# Patient Record
Sex: Male | Born: 1950
Health system: Southern US, Community
[De-identification: ages and names within clinical notes are randomized; demographics above are authoritative.]

## PROBLEM LIST (undated history)

## (undated) DIAGNOSIS — M109 Gout, unspecified: Secondary | ICD-10-CM

## (undated) DIAGNOSIS — R7989 Other specified abnormal findings of blood chemistry: Secondary | ICD-10-CM

## (undated) DIAGNOSIS — F419 Anxiety disorder, unspecified: Secondary | ICD-10-CM

## (undated) DIAGNOSIS — R52 Pain, unspecified: Secondary | ICD-10-CM

## (undated) DIAGNOSIS — F329 Major depressive disorder, single episode, unspecified: Secondary | ICD-10-CM

## (undated) DIAGNOSIS — F32A Depression, unspecified: Secondary | ICD-10-CM

## (undated) DIAGNOSIS — K4030 Unilateral inguinal hernia, with obstruction, without gangrene, not specified as recurrent: Secondary | ICD-10-CM

## (undated) HISTORY — DX: Other specified abnormal findings of blood chemistry: R79.89

## (undated) HISTORY — DX: Depression, unspecified: F32.A

## (undated) HISTORY — DX: Gout, unspecified: M10.9

## (undated) HISTORY — DX: Pain, unspecified: R52

## (undated) HISTORY — DX: Unilateral inguinal hernia, with obstruction, without gangrene, not specified as recurrent: K40.30

## (undated) HISTORY — DX: Anxiety disorder, unspecified: F41.9

---
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
## 1992-07-23 HISTORY — PX: APPENDECTOMY: SHX54

## 2012-08-23 DEATH — deceased

## 2013-02-03 DIAGNOSIS — R109 Unspecified abdominal pain: Secondary | ICD-10-CM | POA: Insufficient documentation

## 2013-02-03 DIAGNOSIS — F419 Anxiety disorder, unspecified: Secondary | ICD-10-CM | POA: Insufficient documentation

## 2013-02-03 HISTORY — DX: Unspecified abdominal pain: R10.9

## 2013-02-26 DIAGNOSIS — J329 Chronic sinusitis, unspecified: Secondary | ICD-10-CM | POA: Insufficient documentation

## 2013-02-26 DIAGNOSIS — J449 Chronic obstructive pulmonary disease, unspecified: Secondary | ICD-10-CM | POA: Insufficient documentation

## 2013-02-26 DIAGNOSIS — R0600 Dyspnea, unspecified: Secondary | ICD-10-CM | POA: Insufficient documentation

## 2013-07-23 HISTORY — PX: NASAL SINUS SURGERY: SHX719

## 2013-08-13 DIAGNOSIS — K469 Unspecified abdominal hernia without obstruction or gangrene: Secondary | ICD-10-CM | POA: Insufficient documentation

## 2013-08-13 DIAGNOSIS — F17200 Nicotine dependence, unspecified, uncomplicated: Secondary | ICD-10-CM | POA: Insufficient documentation

## 2015-04-07 ENCOUNTER — Encounter: Payer: Self-pay | Admitting: Emergency Medicine

## 2015-04-07 ENCOUNTER — Emergency Department
Admission: EM | Admit: 2015-04-07 | Discharge: 2015-04-07 | Disposition: A | Discharge status: Home / Self Care | Payer: Self-pay | Attending: Emergency Medicine | Admitting: Emergency Medicine

## 2015-04-07 DIAGNOSIS — M109 Gout, unspecified: Secondary | ICD-10-CM

## 2015-04-07 DIAGNOSIS — M1009 Idiopathic gout, multiple sites: Secondary | ICD-10-CM | POA: Insufficient documentation

## 2015-04-07 DIAGNOSIS — Z720 Tobacco use: Secondary | ICD-10-CM | POA: Insufficient documentation

## 2015-04-07 LAB — COMPREHENSIVE METABOLIC PANEL
ALBUMIN: 3.8 g/dL (ref 3.5–5.0)
ALT: 12 U/L — ABNORMAL LOW (ref 17–63)
AST: 19 U/L (ref 15–41)
Alkaline Phosphatase: 62 U/L (ref 38–126)
Anion gap: 9 (ref 5–15)
BUN: 12 mg/dL (ref 6–20)
CHLORIDE: 98 mmol/L — AB (ref 101–111)
CO2: 28 mmol/L (ref 22–32)
Calcium: 9.5 mg/dL (ref 8.9–10.3)
Creatinine, Ser: 0.82 mg/dL (ref 0.61–1.24)
GFR calc Af Amer: >60 mL/min (ref >60)
GFR calc non Af Amer: >60 mL/min (ref >60)
GLUCOSE: 114 mg/dL — AB (ref 65–99)
POTASSIUM: 4.3 mmol/L (ref 3.5–5.1)
SODIUM: 135 mmol/L (ref 135–145)
Total Bilirubin: 1.9 mg/dL — ABNORMAL HIGH (ref 0.3–1.2)
Total Protein: 8.2 g/dL — ABNORMAL HIGH (ref 6.5–8.1)

## 2015-04-07 LAB — URINALYSIS COMPLETE WITH MICROSCOPIC (ARMC ONLY)
BACTERIA UA: NONE SEEN
BILIRUBIN URINE: NEGATIVE
Glucose, UA: NEGATIVE mg/dL
Hgb urine dipstick: NEGATIVE
LEUKOCYTES UA: NEGATIVE
Nitrite: NEGATIVE
PH: 6 (ref 5.0–8.0)
Protein, ur: 30 mg/dL — AB
Specific Gravity, Urine: 1.019 (ref 1.005–1.030)

## 2015-04-07 LAB — URIC ACID: URIC ACID, SERUM: 7.7 mg/dL — AB (ref 4.4–7.6)

## 2015-04-07 LAB — CBC
HEMATOCRIT: 42.2 % (ref 40.0–52.0)
HEMOGLOBIN: 14.5 g/dL (ref 13.0–18.0)
MCH: 32.6 pg (ref 26.0–34.0)
MCHC: 34.3 g/dL (ref 32.0–36.0)
MCV: 95 fL (ref 80.0–100.0)
Platelets: 238 10*3/uL (ref 150–440)
RBC: 4.45 MIL/uL (ref 4.40–5.90)
RDW: 13 % (ref 11.5–14.5)
WBC: 11.4 10*3/uL — AB (ref 3.8–10.6)

## 2015-04-07 LAB — LIPASE, BLOOD: LIPASE: 35 U/L (ref 22–51)

## 2015-04-07 MED ORDER — OXYCODONE-ACETAMINOPHEN 5-325 MG PO TABS: 1.0000 | ORAL_TABLET | Freq: Four times a day (QID) | ORAL | Status: DC | PRN

## 2015-04-07 MED ORDER — ONDANSETRON 4 MG PO TBDP
8.0000 mg | ORAL_TABLET | Freq: Once | ORAL | Status: AC
  Administered 2015-04-07: 8 mg via ORAL

## 2015-04-07 MED ORDER — OXYCODONE-ACETAMINOPHEN 5-325 MG PO TABS
ORAL_TABLET | ORAL | Status: AC
  Administered 2015-04-07: 2 via ORAL
  Filled 2015-04-07: qty 2

## 2015-04-07 MED ORDER — COLCHICINE 0.6 MG PO TABS
1.2000 mg | ORAL_TABLET | Freq: Once | ORAL | Status: AC
  Administered 2015-04-07: 1.2 mg via ORAL

## 2015-04-07 MED ORDER — COLCHICINE 0.6 MG PO TABS
ORAL_TABLET | ORAL | Status: AC
  Administered 2015-04-07: 1.2 mg via ORAL
  Filled 2015-04-07: qty 2

## 2015-04-07 MED ORDER — ALLOPURINOL 100 MG PO TABS: 100.0000 mg | ORAL_TABLET | Freq: Every day | ORAL | Status: DC

## 2015-04-07 MED ORDER — OXYCODONE-ACETAMINOPHEN 5-325 MG PO TABS
2.0000 | ORAL_TABLET | Freq: Once | ORAL | Status: AC
  Administered 2015-04-07: 2 via ORAL

## 2015-04-07 NOTE — ED Notes (Signed)
Says not feeling well for months and has left lower quad pain, but really came for swelling in right knee and left foot taht he thinks may be gout

## 2015-04-07 NOTE — ED Provider Notes (Signed)
[facility] Emergency Department Provider Note  ____________________________________________  Time seen: 3:10 PM  I have reviewed the triage vital signs and the nursing notes.   HISTORY  Chief Complaint Joint Swelling    HPI Lance House is a 64 y.o. male who complains of right knee pain for 3 days. Also bilateral foot pain. The knee is very swollen according the patient. He has a history of gout but has not had any flareups for 20 years. He has been taking allopurinol up to about 8 years ago.when he stopped taking the allopurinol, he also stopped drinking alcohol, however he is receiving this over the past 2-3 years. He drinks primarily beer every night. No fevers or chills. No recent trauma or soft tissue injuries. No penile discharge or concern for sexual transmitted infections     History reviewed. No pertinent past medical history. gout  There are no active problems to display for this patient.    No past surgical history on file. negative  Current Outpatient Rx  Name  Route  Sig  Dispense  Refill  . allopurinol (ZYLOPRIM) 100 MG tablet   Oral   Take 1 tablet (100 mg total) by mouth daily. Take 100mg  by mouth daily for 1 week.  Then increase to 200mg  by mouth daily for 1 more week.  Then increase to 300mg  by mouth daily thereafter.   90 tablet   3   . oxyCODONE-acetaminophen (ROXICET) 5-325 MG per tablet   Oral   Take 1 tablet by mouth every 6 (six) hours as needed for severe pain.   12 tablet   0      Allergies Ciprocin-fluocin-procin   No family history on file.  Social History Social History  Substance Use Topics  . Smoking status: Current Every Day Smoker  . Smokeless tobacco: None  . Alcohol Use: Yes    Review of Systems  Constitutional:   No fever or chills. No weight changes Eyes:   No blurry vision or double vision.  ENT:   No sore throat. Cardiovascular:   No chest pain. Respiratory:   No dyspnea or  cough. Gastrointestinal:   Negative for abdominal pain, vomiting and diarrhea.  No BRBPR or melena. Genitourinary:   Negative for dysuria, urinary retention, bloody urine, or difficulty urinating. Musculoskeletal:   Bilateral foot pain as well as right knee pain and swelling. Skin:   Negative for rash. Neurological:   Negative for headaches, focal weakness or numbness. Psychiatric:  No anxiety or depression.   Endocrine:  No hot/cold intolerance, changes in energy, or sleep difficulty.  10-point ROS otherwise negative.  ____________________________________________   PHYSICAL EXAM:  VITAL SIGNS: ED Triage Vitals  Enc Vitals Group     BP 04/07/15 1324 121/93 mmHg     Pulse Rate 04/07/15 1324 103     Resp 04/07/15 1324 18     Temp 04/07/15 1326 98.3 F (36.8 C)     Temp Source 04/07/15 1326 Oral     SpO2 04/07/15 1324 99 %     Weight 04/07/15 1324 180 lb (81.647 kg)     Height 04/07/15 1324 6\' 1"  (1.854 m)     Head Cir --      Peak Flow --      Pain Score 04/07/15 1326 10     Pain Loc --      Pain Edu? --      Excl. in [place]? --      Constitutional:   Alert and  oriented. Well appearing amoderate distress due to pain. Eyes:   No scleral icterus. No conjunctival pallor. PERRL. EOMI ENT   Head:   Normocephalic and atraumatic.   Nose:   No congestion/rhinnorhea. No septal hematoma   Mouth/Throat:   MMM, no pharyngeal erythema. No peritonsillar mass. No uvula shift.   Neck:   No stridor. No SubQ emphysema. No meningismus. Hematological/Lymphatic/Immunilogical:   No cervical lymphadenopathy. Cardiovascular:   RRR. Normal and symmetric distal pulses are present in all extremities. No murmurs, rubs, or gallops. Respiratory:   Normal respiratory effort without tachypnea nor retractions. Breath sounds are clear and equal bilaterally. No wheezes/rales/rhonchi. Gastrointestinal:   Soft and nontender. No distention. There is no CVA tenderness.  No rebound, rigidity, or  guarding. Genitourinary:   deferred Musculoskeletal:   Bilateral hips unremarkable. Left knee is unremarkable. Left foot shows some diffuse swelling without focal erythema tenderness. It is stable without any bony tenderness. Right foot is unremarkable. Right knee is severely enlarged with a large joint effusion palpable on exam. It is also very tender to the touch. There is no erythema, and is not warm to the touch. No induration or cellulitis.. Neurologic:   Normal speech and language.  CN 2-10 normal. Motor grossly intact. No pronator drift.  Normal gait. No gross focal neurologic deficits are appreciated.  Skin:    Skin is warm, dry and intact. No rash noted.  No petechiae, purpura, or bullae. Psychiatric:   Mood and affect are normal. Speech and behavior are normal. Patient exhibits appropriate insight and judgment.  ____________________________________________    LABS (pertinent positives/negatives) (all labs ordered are listed, but only abnormal results are displayed) Labs Reviewed  COMPREHENSIVE METABOLIC PANEL - Abnormal; Notable for the following:    Chloride 98 (*)    Glucose, Bld 114 (*)    Total Protein 8.2 (*)    ALT 12 (*)    Total Bilirubin 1.9 (*)    All other components within normal limits  CBC - Abnormal; Notable for the following:    WBC 11.4 (*)    All other components within normal limits  URINALYSIS COMPLETEWITH MICROSCOPIC ([facility] ONLY) - Abnormal; Notable for the following:    Color, Urine AMBER (*)    APPearance CLEAR (*)    Ketones, ur TRACE (*)    Protein, ur 30 (*)    Squamous Epithelial / LPF 0-5 (*)    All other components within normal limits  URIC ACID - Abnormal; Notable for the following:    Uric Acid, Serum 7.7 (*)    All other components within normal limits  LIPASE, BLOOD   ____________________________________________   EKG    ____________________________________________     RADIOLOGY    ____________________________________________   PROCEDURES   ____________________________________________   INITIAL IMPRESSION / ASSESSMENT AND PLAN / ED COURSE  Pertinent labs & imaging results that were available during my care of the patient were reviewed by me and considered in my medical decision making (see chart for details).  Concern for gout flare versus septic arthritis of the right knee. I'll add an uric acid level to the labs, and if this is not elevated I will plan for a joint aspiration to evaluate for infection.  ----------------------------------------- 5:03 PM on 04/07/2015 -----------------------------------------  Uric acid elevated to 7.7. The patient states this is consistent with his gout flares. This makes sense given the fact that he has 8 recently started drinking more and more severe after having discontinued the allopurinol 8 years ago.  We'll give him colchicine Zofran and Percocet and reassess.  ----------------------------------------- 7:44 PM on 04/07/2015 -----------------------------------------  Patient feeling much better. Able to stand and walk short distances. Has crutches in the car. Comfortable with going home. We'll restart him on allopurinol and have him take Roxicet as well over the next few days. Encouraged him to follow up with primary care for continued monitoring of his symptoms. Also encourage him to stop drinking beer and stay well hydrated to help with this. Low suspicion at this point for septic arthritis. Low suspicion for necrotizing fasciitis ostial myelitis. I did encourage him to follow-up for consideration of any rheumatologic or autoimmune inflammatory disease.   ____________________________________________   FINAL CLINICAL IMPRESSION(S) / ED DIAGNOSES  Final diagnoses:  Acute gout of multiple sites, unspecified cause      Carrie Mew, MD 04/07/15 1945

## 2015-04-07 NOTE — Discharge Instructions (Signed)
Gout Gout is an inflammatory arthritis caused by a buildup of uric acid crystals in the joints. Uric acid is a chemical that is normally present in the blood. When the level of uric acid in the blood is too high it can form crystals that deposit in your joints and tissues. This causes joint redness, soreness, and swelling (inflammation). Repeat attacks are common. Over time, uric acid crystals can form into masses (tophi) near a joint, destroying bone and causing disfigurement. Gout is treatable and often preventable. CAUSES  The disease begins with elevated levels of uric acid in the blood. Uric acid is produced by your body when it breaks down a naturally found substance called purines. Certain foods you eat, such as meats and fish, contain high amounts of purines. Causes of an elevated uric acid level include:  Being passed down from parent to child (heredity).  Diseases that cause increased uric acid production (such as obesity, psoriasis, and certain cancers).  Excessive alcohol use.  Diet, especially diets rich in meat and seafood.  Medicines, including certain cancer-fighting medicines (chemotherapy), water pills (diuretics), and aspirin.  Chronic kidney disease. The kidneys are no longer able to remove uric acid well.  Problems with metabolism. Conditions strongly associated with gout include:  Obesity.  High blood pressure.  High cholesterol.  Diabetes. Not everyone with elevated uric acid levels gets gout. It is not understood why some people get gout and others do not. Surgery, joint injury, and eating too much of certain foods are some of the factors that can lead to gout attacks. SYMPTOMS   An attack of gout comes on quickly. It causes intense pain with redness, swelling, and warmth in a joint.  Fever can occur.  Often, only one joint is involved. Certain joints are more commonly involved:  Base of the big toe.  Knee.  Ankle.  Wrist.  Finger. Without  treatment, an attack usually goes away in a few days to weeks. Between attacks, you usually will not have symptoms, which is different from many other forms of arthritis. DIAGNOSIS  Your caregiver will suspect gout based on your symptoms and exam. In some cases, tests may be recommended. The tests may include:  Blood tests.  Urine tests.  X-rays.  Joint fluid exam. This exam requires a needle to remove fluid from the joint (arthrocentesis). Using a microscope, gout is confirmed when uric acid crystals are seen in the joint fluid. TREATMENT  There are two phases to gout treatment: treating the sudden onset (acute) attack and preventing attacks (prophylaxis).  Treatment of an Acute Attack.  Medicines are used. These include anti-inflammatory medicines or steroid medicines.  An injection of steroid medicine into the affected joint is sometimes necessary.  The painful joint is rested. Movement can worsen the arthritis.  You may use warm or cold treatments on painful joints, depending which works best for you.  Treatment to Prevent Attacks.  If you suffer from frequent gout attacks, your caregiver may advise preventive medicine. These medicines are started after the acute attack subsides. These medicines either help your kidneys eliminate uric acid from your body or decrease your uric acid production. You may need to stay on these medicines for a very long time.  The early phase of treatment with preventive medicine can be associated with an increase in acute gout attacks. For this reason, during the first few months of treatment, your caregiver may also advise you to take medicines usually used for acute gout treatment. Be sure you   understand your caregiver's directions. Your caregiver may make several adjustments to your medicine dose before these medicines are effective.  Discuss dietary treatment with your caregiver or dietitian. Alcohol and drinks high in sugar and fructose and foods  such as meat, poultry, and seafood can increase uric acid levels. Your caregiver or dietitian can advise you on drinks and foods that should be limited. HOME CARE INSTRUCTIONS   Do not take aspirin to relieve pain. This raises uric acid levels.  Only take over-the-counter or prescription medicines for pain, discomfort, or fever as directed by your caregiver.  Rest the joint as much as possible. When in bed, keep sheets and blankets off painful areas.  Keep the affected joint raised (elevated).  Apply warm or cold treatments to painful joints. Use of warm or cold treatments depends on which works best for you.  Use crutches if the painful joint is in your leg.  Drink enough fluids to keep your urine clear or pale yellow. This helps your body get rid of uric acid. Limit alcohol, sugary drinks, and fructose drinks.  Follow your dietary instructions. Pay careful attention to the amount of protein you eat. Your daily diet should emphasize fruits, vegetables, whole grains, and fat-free or low-fat milk products. Discuss the use of coffee, vitamin C, and cherries with your caregiver or dietitian. These may be helpful in lowering uric acid levels.  Maintain a healthy body weight. SEEK MEDICAL CARE IF:   You develop diarrhea, vomiting, or any side effects from medicines.  You do not feel better in 24 hours, or you are getting worse. SEEK IMMEDIATE MEDICAL CARE IF:   Your joint becomes suddenly more tender, and you have chills or a fever. MAKE SURE YOU:   Understand these instructions.  Will watch your condition.  Will get help right away if you are not doing well or get worse. Document Released: 07/06/2000 Document Revised: 11/23/2013 Document Reviewed: 02/20/2012 Exit[employer] Patient Information 2015 [employer]. This information is not intended to replace advice given to you by your health care provider. Make sure you discuss any questions you have with your health care  provider. Low-Purine Diet Purines are compounds that affect the level of uric acid in your body. A low-purine diet is a diet that is low in purines. Eating a low-purine diet can prevent the level of uric acid in your body from getting too high and causing gout or kidney stones or both. WHAT DO I NEED TO KNOW ABOUT THIS DIET?  Choose low-purine foods. Examples of low-purine foods are listed in the next section.  Drink plenty of fluids, especially water. Fluids can help remove uric acid from your body. Try to drink 8-16 cups (1.9-3.8 L) a day.  Limit foods high in fat, especially saturated fat, as fat makes it harder for the body to get rid of uric acid. Foods high in saturated fat include pizza, cheese, ice cream, whole milk, fried foods, and gravies. Choose foods that are lower in fat and lean sources of protein. Use olive oil when cooking as it contains healthy fats that are not high in saturated fat.  Limit alcohol. Alcohol interferes with the elimination of uric acid from your body. If you are having a gout attack, avoid all alcohol.  Keep in mind that different people's bodies react differently to different foods. You will probably learn over time which foods do or do not affect you. If you discover that a food tends to cause your gout to flare up,   avoid eating that food. You can more freely enjoy foods that do not cause problems. If you have any questions about a food item, talk to your dietitian or health care provider. WHICH FOODS ARE LOW, MODERATE, AND HIGH IN PURINES? The following is a list of foods that are low, moderate, and high in purines. You can eat any amount of the foods that are low in purines. You may be able to have small amounts of foods that are moderate in purines. Ask your health care provider how much of a food moderate in purines you can have. Avoid foods high in purines. Grains  Foods low in purines: Enriched white bread, pasta, rice, cake, cornbread, popcorn.  Foods  moderate in purines: Whole-grain breads and cereals, wheat germ, bran, oatmeal. Uncooked oatmeal. Dry wheat bran or wheat germ.  Foods high in purines: Pancakes, French toast, biscuits, muffins. Vegetables  Foods low in purines: All vegetables, except those that are moderate in purines.  Foods moderate in purines: Asparagus, cauliflower, spinach, mushrooms, green peas. Fruits  All fruits are low in purines. Meats and other Protein Foods  Foods low in purines: Eggs, nuts, peanut butter.  Foods moderate in purines: 80-90% lean beef, lamb, veal, pork, poultry, fish, eggs, peanut butter, nuts. Crab, lobster, oysters, and shrimp. Cooked dried beans, peas, and lentils.  Foods high in purines: Anchovies, sardines, herring, mussels, tuna, codfish, scallops, trout, and haddock. Bacon. Organ meats (such as liver or kidney). Tripe. Game meat. Goose. Sweetbreads. Dairy  All dairy foods are low in purines. Low-fat and fat-free dairy products are best because they are low in saturated fat. Beverages  Drinks low in purines: Water, carbonated beverages, tea, coffee, cocoa.  Drinks moderate in purines: Soft drinks and other drinks sweetened with high-fructose corn syrup. Juices. To find whether a food or drink is sweetened with high-fructose corn syrup, look at the ingredients list.  Drinks high in purines: Alcoholic beverages (such as beer). Condiments  Foods low in purines: Salt, herbs, olives, pickles, relishes, vinegar.  Foods moderate in purines: Butter, margarine, oils, mayonnaise. Fats and Oils  Foods low in purines: All types, except gravies and sauces made with meat.  Foods high in purines: Gravies and sauces made with meat. Other Foods  Foods low in purines: Sugars, sweets, gelatin. Cake. Soups made without meat.  Foods moderate in purines: Meat-based or fish-based soups, broths, or bouillons. Foods and drinks sweetened with high-fructose corn syrup.  Foods high in purines:  High-fat desserts (such as ice cream, cookies, cakes, pies, doughnuts, and chocolate). Contact your dietitian for more information on foods that are not listed here. Document Released: 11/03/2010 Document Revised: 07/14/2013 Document Reviewed: 06/15/2013 Exit[employer] Patient Information 2015 [employer]. This information is not intended to replace advice given to you by your health care provider. Make sure you discuss any questions you have with your health care provider.  

## 2015-04-07 NOTE — ED Notes (Signed)
Pt states right sided knee pain for 3 days, has hx of gout but not on any medications, pt unable to bear weight on right leg, inflammation and tenderness noted to right knee area

## 2018-03-28 ENCOUNTER — Ambulatory Visit: Payer: Self-pay | Admitting: Nurse Practitioner

## 2019-04-07 ENCOUNTER — Encounter: Payer: Self-pay | Admitting: Emergency Medicine

## 2019-04-07 ENCOUNTER — Other Ambulatory Visit: Payer: Self-pay

## 2019-04-07 DIAGNOSIS — F17200 Nicotine dependence, unspecified, uncomplicated: Secondary | ICD-10-CM | POA: Insufficient documentation

## 2019-04-07 DIAGNOSIS — Q676 Pectus excavatum: Secondary | ICD-10-CM | POA: Insufficient documentation

## 2019-04-07 DIAGNOSIS — R918 Other nonspecific abnormal finding of lung field: Secondary | ICD-10-CM | POA: Insufficient documentation

## 2019-04-07 DIAGNOSIS — I700 Atherosclerosis of aorta: Secondary | ICD-10-CM | POA: Insufficient documentation

## 2019-04-07 DIAGNOSIS — K4090 Unilateral inguinal hernia, without obstruction or gangrene, not specified as recurrent: Secondary | ICD-10-CM | POA: Diagnosis not present

## 2019-04-07 DIAGNOSIS — Z20828 Contact with and (suspected) exposure to other viral communicable diseases: Secondary | ICD-10-CM | POA: Diagnosis not present

## 2019-04-07 DIAGNOSIS — N400 Enlarged prostate without lower urinary tract symptoms: Secondary | ICD-10-CM | POA: Diagnosis not present

## 2019-04-07 DIAGNOSIS — R69 Illness, unspecified: Secondary | ICD-10-CM | POA: Diagnosis not present

## 2019-04-07 DIAGNOSIS — Z79899 Other long term (current) drug therapy: Secondary | ICD-10-CM | POA: Diagnosis not present

## 2019-04-07 DIAGNOSIS — F419 Anxiety disorder, unspecified: Secondary | ICD-10-CM | POA: Insufficient documentation

## 2019-04-07 DIAGNOSIS — Z03818 Encounter for observation for suspected exposure to other biological agents ruled out: Secondary | ICD-10-CM | POA: Diagnosis not present

## 2019-04-07 LAB — URINALYSIS, COMPLETE (UACMP) WITH MICROSCOPIC
Bacteria, UA: NONE SEEN
Bilirubin Urine: NEGATIVE
Glucose, UA: NEGATIVE mg/dL
Hgb urine dipstick: NEGATIVE
Ketones, ur: NEGATIVE mg/dL
Leukocytes,Ua: NEGATIVE
Nitrite: NEGATIVE
Protein, ur: NEGATIVE mg/dL
Specific Gravity, Urine: 1.002 — ABNORMAL LOW (ref 1.005–1.030)
Squamous Epithelial / LPF: NONE SEEN (ref 0–5)
WBC, UA: NONE SEEN WBC/hpf (ref 0–5)
pH: 7 (ref 5.0–8.0)

## 2019-04-07 LAB — CBC WITH DIFFERENTIAL/PLATELET
Abs Immature Granulocytes: 0.02 10*3/uL (ref 0.00–0.07)
Basophils Absolute: 0.1 10*3/uL (ref 0.0–0.1)
Basophils Relative: 2 %
Eosinophils Absolute: 0.3 10*3/uL (ref 0.0–0.5)
Eosinophils Relative: 4 %
HCT: 49.6 % (ref 39.0–52.0)
Hemoglobin: 16.9 g/dL (ref 13.0–17.0)
Immature Granulocytes: 0 %
Lymphocytes Relative: 37 %
Lymphs Abs: 2.3 10*3/uL (ref 0.7–4.0)
MCH: 33.9 pg (ref 26.0–34.0)
MCHC: 34.1 g/dL (ref 30.0–36.0)
MCV: 99.6 fL (ref 80.0–100.0)
Monocytes Absolute: 0.5 10*3/uL (ref 0.1–1.0)
Monocytes Relative: 7 %
Neutro Abs: 3 10*3/uL (ref 1.7–7.7)
Neutrophils Relative %: 50 %
Platelets: 199 10*3/uL (ref 150–400)
RBC: 4.98 MIL/uL (ref 4.22–5.81)
RDW: 13.1 % (ref 11.5–15.5)
WBC: 6.1 10*3/uL (ref 4.0–10.5)
nRBC: 0 % (ref 0.0–0.2)

## 2019-04-07 NOTE — ED Triage Notes (Addendum)
Patient ambulatory to triage with steady gait, without difficulty or distress noted, mask in place; pt reports swelling to left side  groin x year that has gradually gotten larger; st was seen at [facility] previous for same and told it was NOT a hernia; denies any accomp symptoms; pt admits to daily alcohol use as well

## 2019-04-08 ENCOUNTER — Emergency Department: Payer: Medicare HMO

## 2019-04-08 ENCOUNTER — Telehealth: Payer: Self-pay | Admitting: General Surgery

## 2019-04-08 ENCOUNTER — Other Ambulatory Visit: Payer: Self-pay | Admitting: Physician Assistant

## 2019-04-08 ENCOUNTER — Observation Stay
Admission: EM | Admit: 2019-04-08 | Discharge: 2019-04-08 | Disposition: A | Discharge status: Home / Self Care | Payer: Medicare HMO | Attending: General Surgery | Admitting: General Surgery

## 2019-04-08 DIAGNOSIS — R109 Unspecified abdominal pain: Secondary | ICD-10-CM

## 2019-04-08 DIAGNOSIS — K4090 Unilateral inguinal hernia, without obstruction or gangrene, not specified as recurrent: Secondary | ICD-10-CM | POA: Diagnosis not present

## 2019-04-08 HISTORY — DX: Unspecified abdominal pain: R10.9

## 2019-04-08 LAB — COMPREHENSIVE METABOLIC PANEL
ALT: 25 U/L (ref 0–44)
AST: 33 U/L (ref 15–41)
Albumin: 4.4 g/dL (ref 3.5–5.0)
Alkaline Phosphatase: 64 U/L (ref 38–126)
Anion gap: 11 (ref 5–15)
BUN: 7 mg/dL — ABNORMAL LOW (ref 8–23)
CO2: 25 mmol/L (ref 22–32)
Calcium: 9 mg/dL (ref 8.9–10.3)
Chloride: 102 mmol/L (ref 98–111)
Creatinine, Ser: 0.82 mg/dL (ref 0.61–1.24)
GFR calc Af Amer: >60 mL/min (ref >60)
GFR calc non Af Amer: >60 mL/min (ref >60)
Glucose, Bld: 89 mg/dL (ref 70–99)
Potassium: 4.1 mmol/L (ref 3.5–5.1)
Sodium: 138 mmol/L (ref 135–145)
Total Bilirubin: 1.2 mg/dL (ref 0.3–1.2)
Total Protein: 7.9 g/dL (ref 6.5–8.1)

## 2019-04-08 LAB — LIPASE, BLOOD: Lipase: 33 U/L (ref 11–51)

## 2019-04-08 LAB — ETHANOL: Alcohol, Ethyl (B): 117 mg/dL — ABNORMAL HIGH (ref <10)

## 2019-04-08 LAB — LACTIC ACID, PLASMA: Lactic Acid, Venous: <0.3 mmol/L — ABNORMAL LOW (ref 0.5–1.9)

## 2019-04-08 IMAGING — CT CT ABD-PELV W/ CM
2 of 6 series · 15 of 46 positions shown, 17 images · IV contrast (APPLIED)
Comparison: None.

CLINICAL DATA: Left groin swelling, gradually increasing

EXAM:
CT ABDOMEN AND PELVIS WITH CONTRAST
TECHNIQUE: Multidetector CT imaging of the abdomen and pelvis was performed
using the standard protocol following bolus administration of
intravenous contrast.
CONTRAST:  100mL OMNIPAQUE IOHEXOL 300 MG/ML  SOLN

[Series 2: routine abd/pel with · axial · 0.87mm/px · z∈[-1019,-569]mm · 12 of 104 slices shown, 14 images]
[im 7/104  soft-tissue]
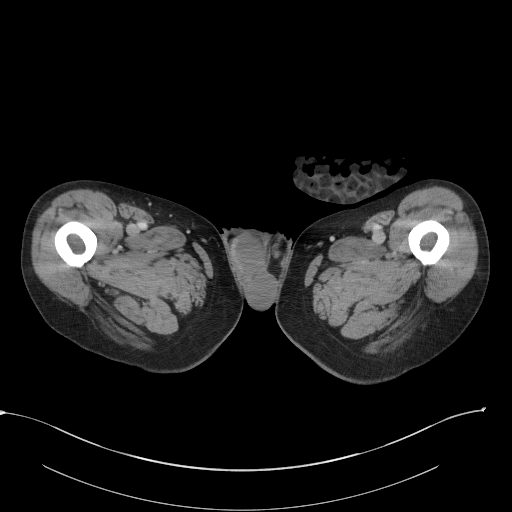
[im 7/104  bone]
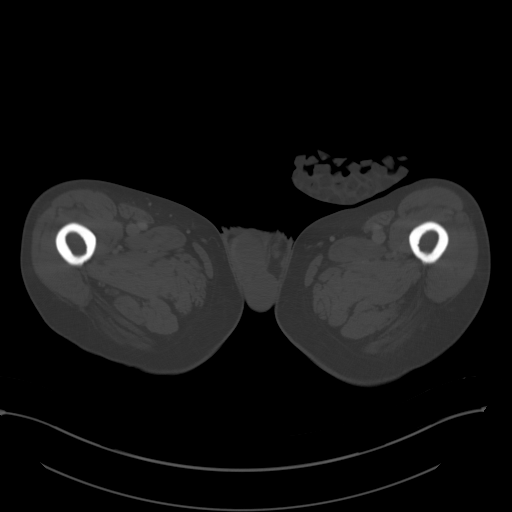
[im 13/104  soft-tissue]
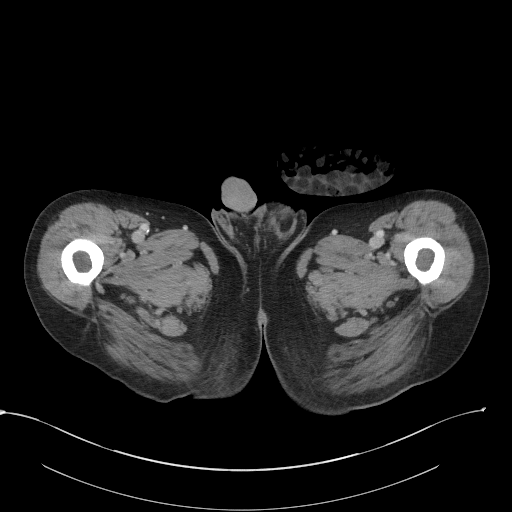
[im 26/104  soft-tissue]
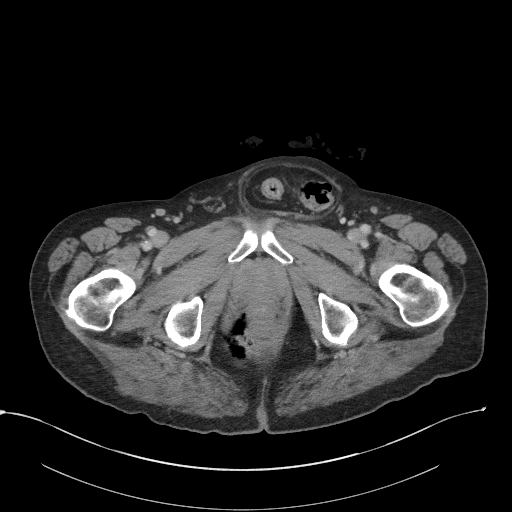
[im 33/104  soft-tissue]
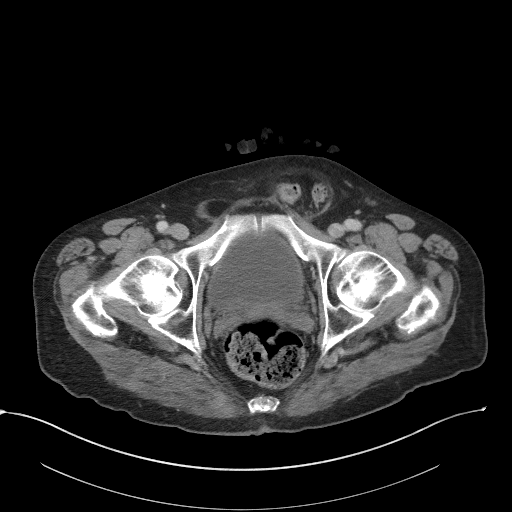
[im 39/104  soft-tissue]
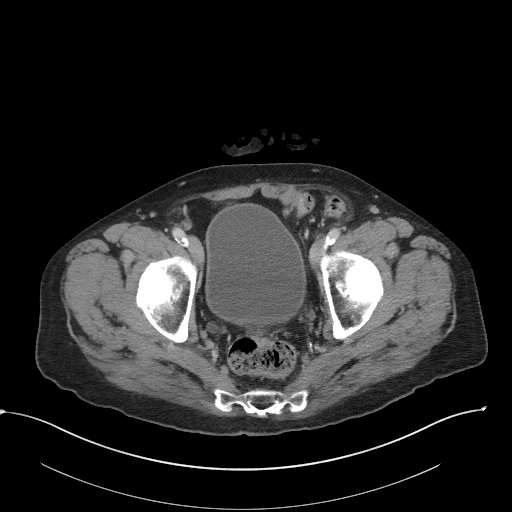
[im 46/104  soft-tissue]
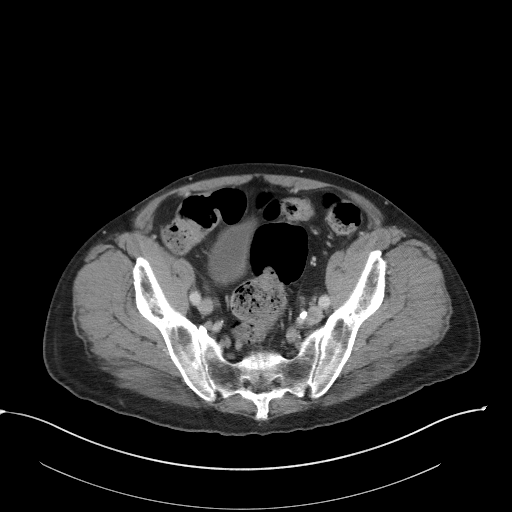
[im 58/104  soft-tissue]
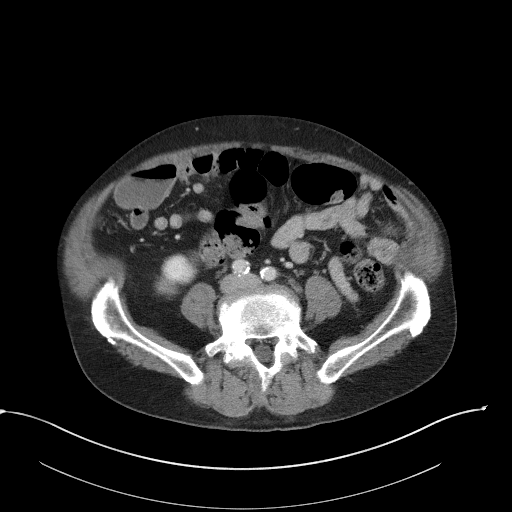
[im 65/104  soft-tissue]
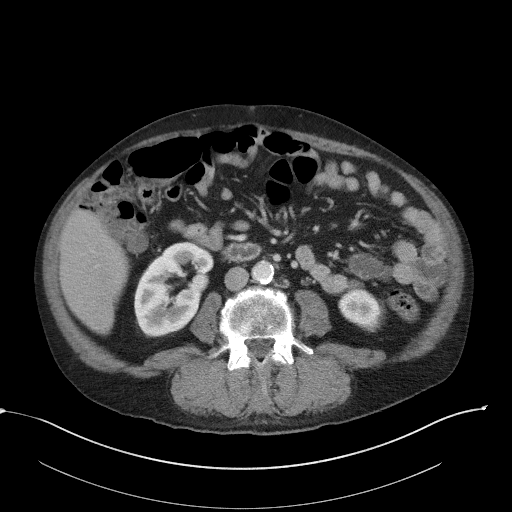
[im 71/104  soft-tissue]
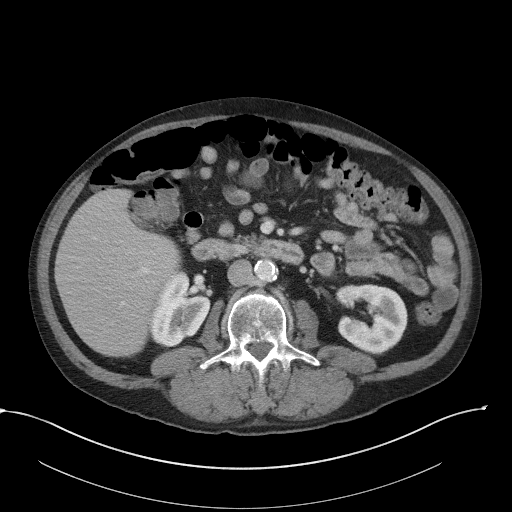
[im 71/104  bone]
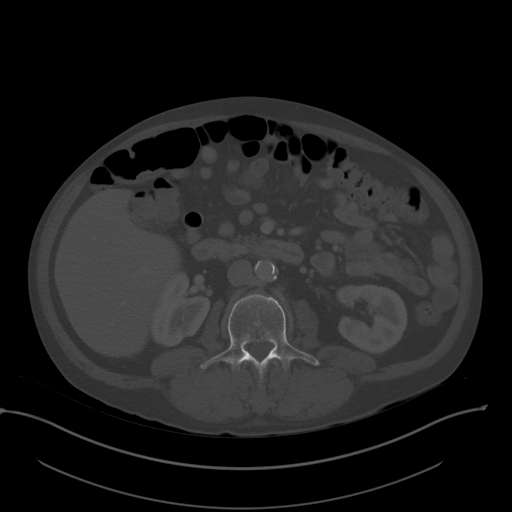
[im 78/104  soft-tissue]
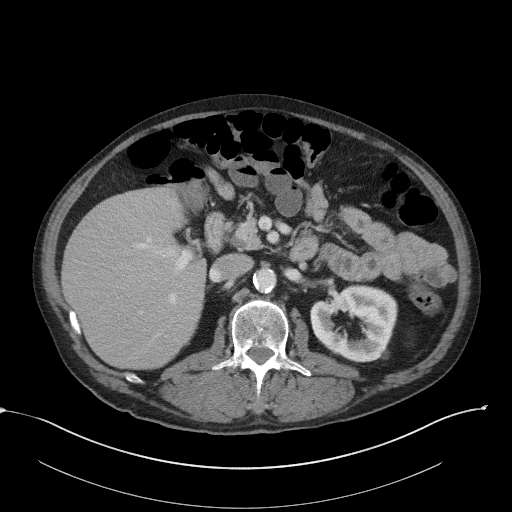
[im 91/104  soft-tissue]
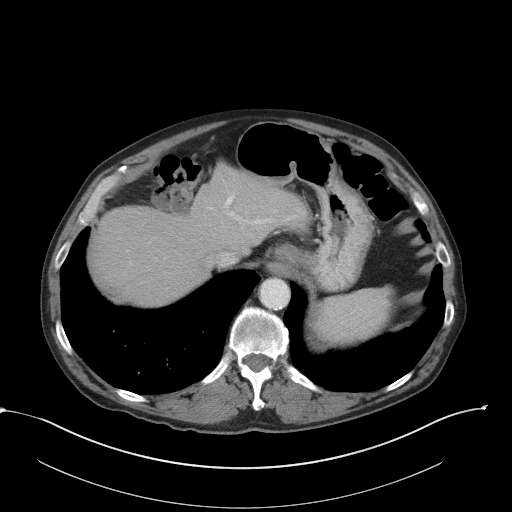
[im 97/104  soft-tissue]
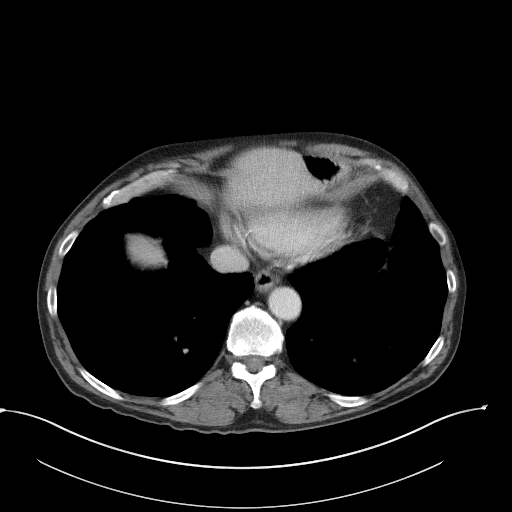

[Series 5: coronal st · coronal · 0.77mm/px · 3 of 90 slices shown]
[im 30/90  soft-tissue]
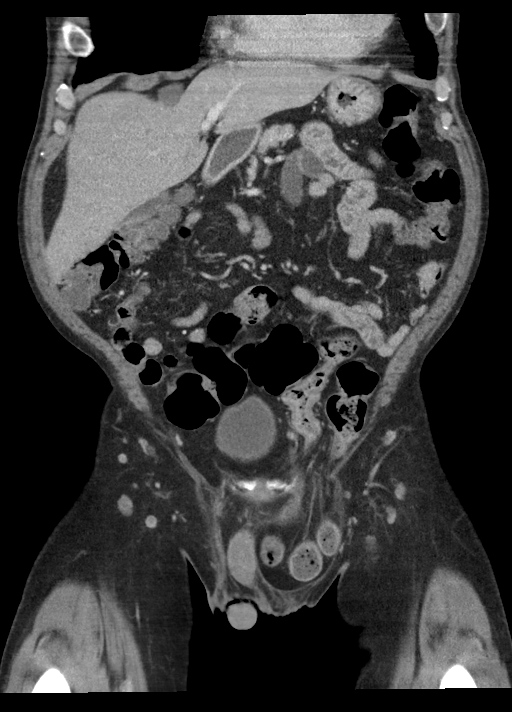
[im 40/90  soft-tissue]
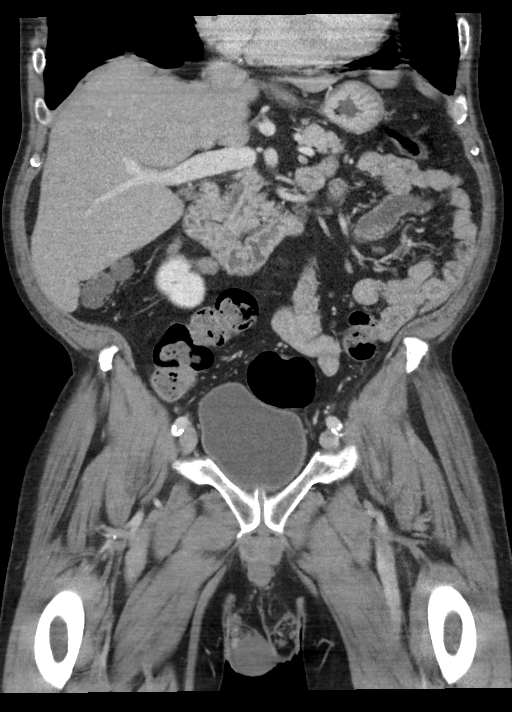
[im 50/90  soft-tissue]
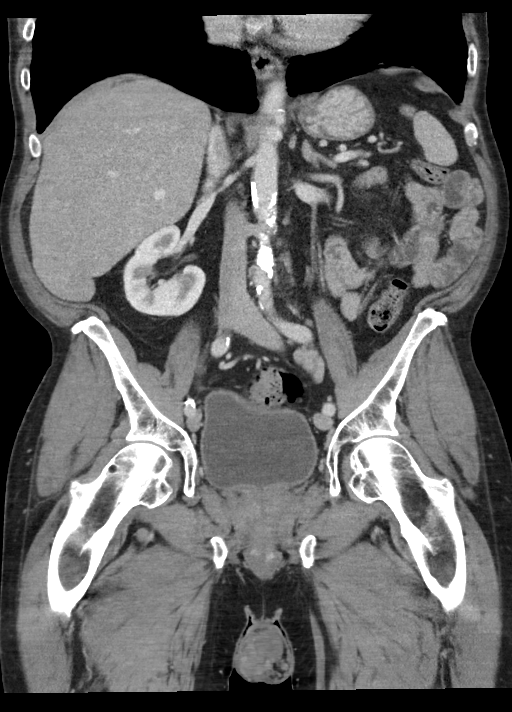

[15 of 46 positions shown; findings below may reference images not displayed]

FINDINGS: Lower chest: Patchy ground-glass opacities present in the posterior
right lower lobe. Pectus excavatum deformity of the chest wall. Mild
compression of the right ventricle. Normal heart size. No
pericardial effusion.

Hepatobiliary: No focal liver abnormality is seen. No gallstones,
gallbladder wall thickening, or biliary dilatation.

Pancreas: Unremarkable. No pancreatic ductal dilatation or
surrounding inflammatory changes.

Spleen: Normal in size without focal abnormality.

Adrenals/Urinary Tract: Adrenal glands are unremarkable. Kidneys are
normal, without renal calculi, focal lesion, or hydronephrosis.
Bladder is unremarkable.

Stomach/Bowel: Distal esophagus, stomach and duodenal sweep are
unremarkable. No bowel wall thickening or dilatation. No evidence of
obstruction. Nonvisualization of the appendix. No pericecal
inflammation. Redundancy of the ascending colon with a interposition
anterior to the liver. Portion of the sigmoid colon protrudes into a
fat and bowel containing left inguinal hernia. No evidence of
mechanical obstruction or vascular compromise to the herniated fat
or bowel segment. No abnormal colonic wall thickening or dilatation

Vascular/Lymphatic: Atherosclerotic plaque within the normal caliber
aorta. No suspicious or enlarged lymph nodes in the included
lymphatic chains.

Reproductive: Coarse eccentric calcification of the prostate. Mild
prostatomegaly. No concerning abnormalities of the prostate or
seminal vesicles.

Other: Fat and bowel containing left inguinal hernia, as above.
Overlying thermal pack is noted. No other bowel containing hernias.
Mild right fat containing inguinal hernia. No free abdominopelvic
air or fluid.

Musculoskeletal: Multilevel degenerative changes are present in the
imaged portions of the spine. No acute osseous abnormality or
suspicious osseous lesion.
IMPRESSION: 1. Fat and bowel containing left inguinal hernia. No evidence of
mechanical obstruction or vascular compromise to the herniated fat
or bowel segment. Overlying compress noted.
2. Small fat containing right inguinal hernia.
3. Patchy ground-glass opacities in the posterior right lower lobe,
could reflect acute infection/inflammation or possible focal
atelectatic change.
4. Aortic Atherosclerosis ([H7]-[H7]).

## 2019-04-08 MED ORDER — MORPHINE SULFATE (PF) 4 MG/ML IV SOLN
4.0000 mg | Freq: Once | INTRAVENOUS | Status: AC
  Administered 2019-04-08: 4 mg via INTRAVENOUS
  Filled 2019-04-08: qty 1

## 2019-04-08 MED ORDER — ONDANSETRON HCL 4 MG/2ML IJ SOLN: 4.0000 mg | Freq: Four times a day (QID) | INTRAMUSCULAR | Status: DC | PRN

## 2019-04-08 MED ORDER — ALPRAZOLAM 0.5 MG PO TABS
0.5000 mg | ORAL_TABLET | Freq: Every day | ORAL | Status: DC | PRN
  Administered 2019-04-08: 10:00:00 0.5 mg via ORAL
  Filled 2019-04-08: qty 1

## 2019-04-08 MED ORDER — HYDROMORPHONE HCL 1 MG/ML IJ SOLN
1.0000 mg | Freq: Once | INTRAMUSCULAR | Status: AC
  Administered 2019-04-08: 1 mg via INTRAVENOUS
  Filled 2019-04-08: qty 1

## 2019-04-08 MED ORDER — ACETAMINOPHEN 650 MG RE SUPP: 650.0000 mg | Freq: Four times a day (QID) | RECTAL | Status: DC | PRN

## 2019-04-08 MED ORDER — ONDANSETRON HCL 4 MG/2ML IJ SOLN
4.0000 mg | Freq: Once | INTRAMUSCULAR | Status: AC
  Administered 2019-04-08: 4 mg via INTRAVENOUS
  Filled 2019-04-08: qty 2

## 2019-04-08 MED ORDER — HYDROMORPHONE HCL 1 MG/ML IJ SOLN: 0.5000 mg | INTRAMUSCULAR | Status: DC | PRN

## 2019-04-08 MED ORDER — LORAZEPAM 1 MG PO TABS
1.0000 mg | ORAL_TABLET | ORAL | Status: DC | PRN
  Administered 2019-04-08: 13:00:00 1 mg via ORAL
  Filled 2019-04-08: qty 1

## 2019-04-08 MED ORDER — KETOROLAC TROMETHAMINE 30 MG/ML IJ SOLN
15.0000 mg | Freq: Four times a day (QID) | INTRAMUSCULAR | Status: DC
  Administered 2019-04-08 (×2): 15 mg via INTRAVENOUS
  Filled 2019-04-08 (×2): qty 1

## 2019-04-08 MED ORDER — ACETAMINOPHEN 325 MG PO TABS: 650.0000 mg | ORAL_TABLET | Freq: Four times a day (QID) | ORAL | Status: DC | PRN

## 2019-04-08 MED ORDER — ALLOPURINOL 300 MG PO TABS
300.0000 mg | ORAL_TABLET | Freq: Every day | ORAL | Status: DC
  Administered 2019-04-08: 12:00:00 300 mg via ORAL
  Filled 2019-04-08 (×2): qty 1

## 2019-04-08 MED ORDER — LORAZEPAM 2 MG/ML IJ SOLN: 1.0000 mg | INTRAMUSCULAR | Status: DC | PRN

## 2019-04-08 MED ORDER — ONDANSETRON 4 MG PO TBDP: 4.0000 mg | ORAL_TABLET | Freq: Four times a day (QID) | ORAL | Status: DC | PRN

## 2019-04-08 MED ORDER — HYDROCODONE-ACETAMINOPHEN 5-325 MG PO TABS: 1.0000 | ORAL_TABLET | Freq: Four times a day (QID) | ORAL | 0 refills | Status: DC | PRN

## 2019-04-08 MED ORDER — IOHEXOL 300 MG/ML  SOLN
100.0000 mL | Freq: Once | INTRAMUSCULAR | Status: AC | PRN
  Administered 2019-04-08: 100 mL via INTRAVENOUS

## 2019-04-08 MED ORDER — DEXTROSE IN LACTATED RINGERS 5 % IV SOLN
INTRAVENOUS | Status: DC
  Administered 2019-04-08: 04:00:00 via INTRAVENOUS

## 2019-04-08 NOTE — Telephone Encounter (Signed)
Spoke with Lance House and he will send in the patient's medication electronically. The patient has been notified to check on this in about 20 minutes.

## 2019-04-08 NOTE — Telephone Encounter (Signed)
Patient is calling about a medication that was suppose to of been sent in. Please call patient and advise.

## 2019-04-08 NOTE — ED Notes (Signed)
Wife called, this Probation officer gave update.

## 2019-04-08 NOTE — ED Provider Notes (Signed)
[facility] Emergency Department Provider Note  ____________________________________________  Time seen: Approximately 3:26 AM  I have reviewed the triage vital signs and the nursing notes.   HISTORY  Chief Complaint Groin Pain   HPI Lance House is a 68 y.o. male with no significant PMH who presents for evaluation of abdominal pain and inguinal hernia. Patient has had an inguinal hernia for several years. Over the last year it has been growing significantly in size. Over the last 24 hours, patient has had severe pain. He has not been able to reduce for over 1 year. Passing flatus and having normal bowel movements. The pain is sharp and throbbing, severe, constant, and non radiating.     PMH Anxiety  Past Surgical History:  Procedure Laterality Date  . APPENDECTOMY    . NASAL SINUS SURGERY      Prior to Admission medications   Medication Sig Start Date End Date Taking? Authorizing Provider  ALPRAZolam Duanne Moron) 0.5 MG tablet Take 0.5 mg by mouth daily as needed for anxiety.   Yes [provider]  allopurinol (ZYLOPRIM) 100 MG tablet Take 1 tablet (100 mg total) by mouth daily. Take 100mg  by mouth daily for 1 week.  Then increase to 200mg  by mouth daily for 1 more week.  Then increase to 300mg  by mouth daily thereafter. Patient not taking: Reported on 04/08/2019 04/07/15   Carrie Mew, MD  oxyCODONE-acetaminophen (ROXICET) 5-325 MG per tablet Take 1 tablet by mouth every 6 (six) hours as needed for severe pain. Patient not taking: Reported on 04/08/2019 04/07/15   Carrie Mew, MD    Allergies Ciprocin-fluocin-procin [fluocinolone]  No family history on file.  Social History Social History   Tobacco Use  . Smoking status: Current Every Day Smoker  . Smokeless tobacco: Never Used  Substance Use Topics  . Alcohol use: Yes  . Drug use: Not on file    Review of Systems  Constitutional: Negative for fever. Eyes: Negative for  visual changes. ENT: Negative for sore throat. Neck: No neck pain  Cardiovascular: Negative for chest pain. Respiratory: Negative for shortness of breath. Gastrointestinal: Negative for abdominal pain, vomiting or diarrhea. Genitourinary: Negative for dysuria. + inguinal hernia Musculoskeletal: Negative for back pain. Skin: Negative for rash. Neurological: Negative for headaches, weakness or numbness. Psych: No SI or HI  ____________________________________________   PHYSICAL EXAM:  VITAL SIGNS: ED Triage Vitals  Enc Vitals Group     BP 04/07/19 2328 (!) 168/91     Pulse Rate 04/07/19 2328 82     Resp 04/07/19 2328 18     Temp 04/07/19 2328 99.2 F (37.3 C)     Temp Source 04/07/19 2328 Oral     SpO2 04/07/19 2328 95 %     Weight 04/07/19 2329 190 lb (86.2 kg)     Height 04/07/19 2329 6' (1.829 m)     Head Circumference --      Peak Flow --      Pain Score 04/07/19 2329 10     Pain Loc --      Pain Edu? --      Excl. in [place]? --     Constitutional: Alert and oriented. Well appearing and in no apparent distress. HEENT:      Head: Normocephalic and atraumatic.         Eyes: Conjunctivae are normal. Sclera is non-icteric.       Mouth/Throat: Mucous membranes are moist.       Neck: Supple  with no signs of meningismus. Cardiovascular: Regular rate and rhythm. No murmurs, gallops, or rubs. 2+ symmetrical distal pulses are present in all extremities. No JVD. Respiratory: Normal respiratory effort. Lungs are clear to auscultation bilaterally. No wheezes, crackles, or rhonchi.  Gastrointestinal: Soft, non tender, and non distended with positive bowel sounds. No rebound or guarding. Genitourinary: Large left sided inguinal hernia with no overlying skin changes Musculoskeletal: Nontender with normal range of motion in all extremities. No edema, cyanosis, or erythema of extremities. Neurologic: Normal speech and language. Face is symmetric. Moving all extremities. No gross focal  neurologic deficits are appreciated. Skin: Skin is warm, dry and intact. No rash noted. Psychiatric: Mood and affect are normal. Speech and behavior are normal.  ____________________________________________   LABS (all labs ordered are listed, but only abnormal results are displayed)  Labs Reviewed  COMPREHENSIVE METABOLIC PANEL - Abnormal; Notable for the following components:      Result Value   BUN 7 (*)    All other components within normal limits  URINALYSIS, COMPLETE (UACMP) WITH MICROSCOPIC - Abnormal; Notable for the following components:   Color, Urine STRAW (*)    APPearance CLEAR (*)    Specific Gravity, Urine 1.002 (*)    All other components within normal limits  ETHANOL - Abnormal; Notable for the following components:   Alcohol, Ethyl (B) 117 (*)    All other components within normal limits  LACTIC ACID, PLASMA - Abnormal; Notable for the following components:   Lactic Acid, Venous <0.3 (*)    All other components within normal limits  CBC WITH DIFFERENTIAL/PLATELET  LIPASE, BLOOD  HIV ANTIBODY (ROUTINE TESTING W REFLEX)   ____________________________________________  EKG  none  ____________________________________________  RADIOLOGY  I have personally reviewed the images performed during this visit and I agree with the Radiologist's read.   Interpretation by Radiologist:  Ct Abdomen Pelvis W Contrast  Result Date: 04/08/2019 CLINICAL DATA:  Left groin swelling, gradually increasing EXAM: CT ABDOMEN AND PELVIS WITH CONTRAST TECHNIQUE: Multidetector CT imaging of the abdomen and pelvis was performed using the standard protocol following bolus administration of intravenous contrast. CONTRAST:  171mL OMNIPAQUE IOHEXOL 300 MG/ML  SOLN COMPARISON:  None. FINDINGS: Lower chest: Patchy ground-glass opacities present in the posterior right lower lobe. Pectus excavatum deformity of the chest wall. Mild compression of the right ventricle. Normal heart size. No  pericardial effusion. Hepatobiliary: No focal liver abnormality is seen. No gallstones, gallbladder wall thickening, or biliary dilatation. Pancreas: Unremarkable. No pancreatic ductal dilatation or surrounding inflammatory changes. Spleen: Normal in size without focal abnormality. Adrenals/Urinary Tract: Adrenal glands are unremarkable. Kidneys are normal, without renal calculi, focal lesion, or hydronephrosis. Bladder is unremarkable. Stomach/Bowel: Distal esophagus, stomach and duodenal sweep are unremarkable. No bowel wall thickening or dilatation. No evidence of obstruction. Nonvisualization of the appendix. No pericecal inflammation. Redundancy of the ascending colon with a interposition anterior to the liver. Portion of the sigmoid colon protrudes into a fat and bowel containing left inguinal hernia. No evidence of mechanical obstruction or vascular compromise to the herniated fat or bowel segment. No abnormal colonic wall thickening or dilatation Vascular/Lymphatic: Atherosclerotic plaque within the normal caliber aorta. No suspicious or enlarged lymph nodes in the included lymphatic chains. Reproductive: Coarse eccentric calcification of the prostate. Mild prostatomegaly. No concerning abnormalities of the prostate or seminal vesicles. Other: Fat and bowel containing left inguinal hernia, as above. Overlying thermal pack is noted. No other bowel containing hernias. Mild right fat containing inguinal hernia. No free  abdominopelvic air or fluid. Musculoskeletal: Multilevel degenerative changes are present in the imaged portions of the spine. No acute osseous abnormality or suspicious osseous lesion. IMPRESSION: 1. Fat and bowel containing left inguinal hernia. No evidence of mechanical obstruction or vascular compromise to the herniated fat or bowel segment. Overlying compress noted. 2. Small fat containing right inguinal hernia. 3. Patchy ground-glass opacities in the posterior right lower lobe, could  reflect acute infection/inflammation or possible focal atelectatic change. 4. Aortic Atherosclerosis (ICD10-I70.0). Electronically Signed   By: Lovena Le M.D.   On: 04/08/2019 03:00     ____________________________________________   PROCEDURES  Procedure(s) performed: None Procedures Critical Care performed:  None ____________________________________________   INITIAL IMPRESSION / ASSESSMENT AND PLAN / ED COURSE  68 y.o. male with no significant PMH who presents for evaluation of abdominal pain and inguinal hernia. No clinical signs of obstruction. CT done showing large hernia with no obstruction or strangulation. Attempted to reduce the hernia in the ED with IV morphine, ice, and trendelenburg position unsuccessfully. Patient with significant pain. Surgery consulted. Spoke wit Dr. Celine Ahr who will admit patient for pain control. Unfortunately she does think patient will be able to undergo surgery for repair during this admission due to no openings in the OR for today. She will plan to conscious sedate patient and reduce the hernia. She will admit patient for pain control.        As part of my medical decision making, I reviewed the following data within the [facility] notes reviewed and incorporated, Labs reviewed , Old chart reviewed, Radiograph reviewed , A consult was requested and obtained from this/these consultant(s) Surgery, Notes from prior ED visits and [place] Controlled Substance Database   Patient was evaluated in Emergency Department today for the symptoms described in the history of present illness. Patient was evaluated in the context of the global COVID-19 pandemic, which necessitated consideration that the patient might be at risk for infection with the SARS-CoV-2 virus that causes COVID-19. Institutional protocols and algorithms that pertain to the evaluation of patients at risk for COVID-19 are in a state of rapid change based on information released  by regulatory bodies including the CDC and federal and state organizations. These policies and algorithms were followed during the patient's care in the ED.   ____________________________________________   FINAL CLINICAL IMPRESSION(S) / ED DIAGNOSES   Final diagnoses:  Unilateral inguinal hernia without obstruction or gangrene, recurrence not specified      NEW MEDICATIONS STARTED DURING THIS VISIT:  ED Discharge Orders    None       Note:  This document was prepared using Dragon voice recognition software and may include unintentional dictation errors.    Alfred Levins, [place], MD 04/08/19 [phone]

## 2019-04-08 NOTE — ED Notes (Signed)
Pt wife went home per wife she stated pt drinks 18-24 beers a night and during day gets shakey. Per pt and pt wife, pt takes Xanax BID.

## 2019-04-08 NOTE — ED Notes (Signed)
Messaged admitting provider and informed CIWA was initiated and pt requesting xanax for withdrawal.

## 2019-04-08 NOTE — ED Notes (Signed)
This RN attempted IV access x2 in [facility]

## 2019-04-08 NOTE — ED Notes (Signed)
Lab results reviewed

## 2019-04-08 NOTE — H&P (Addendum)
[facility] SURGICAL HISTORY & PHYSICAL (cpt [id number])  HISTORY OF PRESENT ILLNESS (HPI):  68 y.o. male presented to [facility] ED overnight for left groin pain. Patient reports about 7 years ago he sneezed and noticed a spot in his left groin. This was not bothersome to him for some time. He thinks about 3 years ago the area started to get bigger and bigger but would reduce when he would lay flat. He was apparently evaluated at [facility] for this at some point but was told "he doesn't have a hernia." Eventually he reports the area stopped reducing on its own but it was not bothersome. Over the last 3 days, he reports and increase in discomfort in the area which was progressively worsening. The pain is worse with movement and palpation. Nothing tried to make the pain better. No issues with fever, chills, nausea, emesis, distension, or bowel changes. Only previous abdominal surgery is appendectomy. Work up in the ED was concerning for increasingly symptomatic left inguinal hernia without evidence of incarceration or strangulation which could not be reduced in the ED. Of note, he does consume about 18-24 beers a night per his wife.   General surgery is consulted by emergency medicine physician Dr Rudene Re, MD for evaluation and management of left inguinal hernia without evidence of incarceration or strangulation.     PAST MEDICAL HISTORY (PMH):  History reviewed. No pertinent past medical history.  Reviewed. Otherwise negative.   PAST SURGICAL HISTORY ([place]):  Past Surgical History:  Procedure Laterality Date  . APPENDECTOMY    . NASAL SINUS SURGERY      Reviewed. Otherwise negative.   MEDICATIONS:  Prior to Admission medications   Medication Sig Start Date End Date Taking? Authorizing Provider  ALPRAZolam Duanne Moron) 0.5 MG tablet Take 0.5 mg by mouth daily as needed for anxiety.   Yes [provider]  allopurinol (ZYLOPRIM) 100 MG tablet Take 1 tablet (100 mg total) by mouth  daily. Take 100mg  by mouth daily for 1 week.  Then increase to 200mg  by mouth daily for 1 more week.  Then increase to 300mg  by mouth daily thereafter. Patient not taking: Reported on 04/08/2019 04/07/15   Carrie Mew, MD  oxyCODONE-acetaminophen (ROXICET) 5-325 MG per tablet Take 1 tablet by mouth every 6 (six) hours as needed for severe pain. Patient not taking: Reported on 04/08/2019 04/07/15   Carrie Mew, MD     ALLERGIES:  Allergies  Allergen Reactions  . Ciprocin-Fluocin-Procin [Fluocinolone] Anaphylaxis    Hip hurt     SOCIAL HISTORY:  Social History   Socioeconomic History  . Marital status: Married    Spouse name: Not on file  . Number of children: Not on file  . Years of education: Not on file  . Highest education level: Not on file  Occupational History  . Not on file  Social Needs  . Financial resource strain: Not on file  . Food insecurity    Worry: Not on file    Inability: Not on file  . Transportation needs    Medical: Not on file    Non-medical: Not on file  Tobacco Use  . Smoking status: Current Every Day Smoker  . Smokeless tobacco: Never Used  Substance and Sexual Activity  . Alcohol use: Yes  . Drug use: Not on file  . Sexual activity: Not on file  Lifestyle  . Physical activity    Days per week: Not on file    Minutes per session: Not on file  .  Stress: Not on file  Relationships  . Social Herbalist on phone: Not on file    Gets together: Not on file    Attends religious service: Not on file    Active member of club or organization: Not on file    Attends meetings of clubs or organizations: Not on file    Relationship status: Not on file  . Intimate partner violence    Fear of current or ex partner: Not on file    Emotionally abused: Not on file    Physically abused: Not on file    Forced sexual activity: Not on file  Other Topics Concern  . Not on file  Social History Narrative  . Not on file     FAMILY HISTORY:   No family history on file.  Otherwise negative.   REVIEW OF SYSTEMS:  Review of Systems  Constitutional: Negative for chills and fever.  HENT: Negative for congestion and sore throat.   Respiratory: Negative for cough and shortness of breath.   Cardiovascular: Negative for chest pain and palpitations.  Gastrointestinal: Positive for abdominal pain (Left Groin). Negative for constipation, diarrhea, nausea and vomiting.  Genitourinary: Negative for dysuria and urgency.  All other systems reviewed and are negative.   VITAL SIGNS:  Temp:  [99.2 F (37.3 C)] 99.2 F (37.3 C) (09/15 2328) Pulse Rate:  [69-88] 85 (09/16 0808) Resp:  [16-18] 16 (09/16 0800) BP: (143-172)/(91-121) 172/121 (09/16 0808) SpO2:  [91 %-96 %] 93 % (09/16 0800) Weight:  [86.2 kg] 86.2 kg (09/15 2329)     Height: 6' (182.9 cm) Weight: 86.2 kg BMI (Calculated): 25.76   PHYSICAL EXAM:  Physical Exam Exam conducted with a chaperone present.  Constitutional:      General: He is not in acute distress.    Appearance: Normal appearance. He is not ill-appearing.  HENT:     Head: Normocephalic and atraumatic.  Eyes:     Extraocular Movements: Extraocular movements intact.     Conjunctiva/sclera: Conjunctivae normal.     Pupils: Pupils are equal, round, and reactive to light.  Cardiovascular:     Rate and Rhythm: Normal rate and regular rhythm.     Pulses: Normal pulses.     Heart sounds: No murmur. No friction rub. No gallop.   Pulmonary:     Effort: Pulmonary effort is normal. No respiratory distress.     Breath sounds: No wheezing or rhonchi.  Abdominal:     General: Abdomen is flat. There is no distension.     Tenderness: There is abdominal tenderness. There is no guarding or rebound.     Hernia: A hernia is present. Hernia is present in the left inguinal area. There is no hernia in the right inguinal area.     Comments: Large left inguinal hernia, soft to palpation, able to manually reduce the bowel, still  has likely fat tissue that is unable to reducer, NO EVIDENCE OF INCACERATION  Genitourinary:    Penis: Circumcised.      Scrotum/Testes: Normal.        Right: Mass or tenderness not present.        Left: Mass or tenderness not present.  Musculoskeletal:     Right lower leg: No edema.     Left lower leg: No edema.  Skin:    General: Skin is warm and dry.  Neurological:     General: No focal deficit present.     Mental Status: He is alert and  oriented to person, place, and time.  Psychiatric:        Mood and Affect: Mood normal.        Behavior: Behavior normal.     INTAKE/OUTPUT:  This shift: No intake/output data recorded.  Last 2 shifts: @IOLAST2SHIFTS @  Labs:  CBC Latest Ref Rng & Units 04/07/2019 04/07/2015  WBC 4.0 - 10.5 K/uL 6.1 11.4(H)  Hemoglobin 13.0 - 17.0 g/dL 16.9 14.5  Hematocrit 39.0 - 52.0 % 49.6 42.2  Platelets 150 - 400 K/uL 199 238   CMP Latest Ref Rng & Units 04/07/2019 04/07/2015  Glucose 70 - 99 mg/dL 89 114(H)  BUN 8 - 23 mg/dL 7(L) 12  Creatinine 0.61 - 1.24 mg/dL 0.82 0.82  Sodium 135 - 145 mmol/L 138 135  Potassium 3.5 - 5.1 mmol/L 4.1 4.3  Chloride 98 - 111 mmol/L 102 98(L)  CO2 22 - 32 mmol/L 25 28  Calcium 8.9 - 10.3 mg/dL 9.0 9.5  Total Protein 6.5 - 8.1 g/dL 7.9 8.2(H)  Total Bilirubin 0.3 - 1.2 mg/dL 1.2 1.9(H)  Alkaline Phos 38 - 126 U/L 64 62  AST 15 - 41 U/L 33 19  ALT 0 - 44 U/L 25 12(L)     Imaging studies:   CT Abdomen/Pelvis (04/08/2019) personally reviewed which shows large left inguinal hernia with fat and bowel but no evidence of incarceration, and radiologist report reviewed:  IMPRESSION: 1. Fat and bowel containing left inguinal hernia. No evidence of mechanical obstruction or vascular compromise to the herniated fat or bowel segment. Overlying compress noted. 2. Small fat containing right inguinal hernia. 3. Patchy ground-glass opacities in the posterior right lower lobe, could reflect acute infection/inflammation or  possible focal atelectatic change. 4. Aortic Atherosclerosis (ICD10-I70.0).   Assessment/Plan:  68 y.o. male with increasingly symptomatic left inguinal hernia with fat and bowel containing, no evidence of incaceration   - The bowel is able to be reduced in the ED with manual manipulation. However given the increase in symptoms we will schedule him for outpatient follow up tomorrow (09/17) and schedule him for surgical repair on Monday (09/21) with Dr Celine Ahr. The patient and his wife are understanding that there are no signs of incarceration which would warrant emergent intervention. We will send him home with pain control and he was educated on the importance of abstaining from alcohol use. All of their questions and concerns were addressed and answered. He will be swabbed for COVID prior to discharge.   -- Edison Simon, PA-C [facility] 04/08/2019, 9:23 AM [phone] M-F: 7am - 4pm   I saw and evaluated the patient.  I agree with the above documentation, exam, and plan, which I have edited where appropriate. Fredirick Maudlin  12:12 PM

## 2019-04-08 NOTE — ED Notes (Signed)
Pt signed paper copy of discharge 

## 2019-04-08 NOTE — ED Notes (Signed)
Patient transported to CT 

## 2019-04-08 NOTE — Plan of Care (Signed)
Patient with painful left inguinal hernia, no concern for strangulation based on labs and CT imaging, but unable to be reduced by ED physician who has requested admission for pain management. Will admit, keep NPO with IVF, pain control. Will evaluate for operative intervention this hospitalization versus outpatient follow up. Full H&P to follow.

## 2019-04-08 NOTE — Progress Notes (Signed)
Patient needed pain control for left inguinal hernia. Again discussed the importance of not mixing these medications with alcohol. Prescription sent to pharmacy on file. He has a follow up appointment with myself tomorrow at 12pm to discuss left inguinal hernia repair with Dr Celine Ahr.   Edison Simon, PA-C [facility] 04/08/2019, 2:49 PM [phone] M-F: 7am - 4pm

## 2019-04-08 NOTE — ED Notes (Signed)
ED Provider at bedside. 

## 2019-04-08 NOTE — H&P (View-Only) (Signed)
[facility] SURGICAL HISTORY & PHYSICAL (cpt [phone])  HISTORY OF PRESENT ILLNESS (HPI):  68 y.o. male presented to [facility] ED overnight for left groin pain. Patient reports about 7 years ago he sneezed and noticed a spot in his left groin. This was not bothersome to him for some time. He thinks about 3 years ago the area started to get bigger and bigger but would reduce when he would lay flat. He was apparently evaluated at [facility] for this at some point but was told "he doesn't have a hernia." Eventually he reports the area stopped reducing on its own but it was not bothersome. Over the last 3 days, he reports and increase in discomfort in the area which was progressively worsening. The pain is worse with movement and palpation. Nothing tried to make the pain better. No issues with fever, chills, nausea, emesis, distension, or bowel changes. Only previous abdominal surgery is appendectomy. Work up in the ED was concerning for increasingly symptomatic left inguinal hernia without evidence of incarceration or strangulation which could not be reduced in the ED. Of note, he does consume about 18-24 beers a night per his wife.   General surgery is consulted by emergency medicine physician Dr Rudene Re, MD for evaluation and management of left inguinal hernia without evidence of incarceration or strangulation.     PAST MEDICAL HISTORY (PMH):  History reviewed. No pertinent past medical history.  Reviewed. Otherwise negative.   PAST SURGICAL HISTORY ([place]):  Past Surgical History:  Procedure Laterality Date  . APPENDECTOMY    . NASAL SINUS SURGERY      Reviewed. Otherwise negative.   MEDICATIONS:  Prior to Admission medications   Medication Sig Start Date End Date Taking? Authorizing Provider  ALPRAZolam Duanne Moron) 0.5 MG tablet Take 0.5 mg by mouth daily as needed for anxiety.   Yes [provider]  allopurinol (ZYLOPRIM) 100 MG tablet Take 1 tablet (100 mg total) by mouth  daily. Take 100mg  by mouth daily for 1 week.  Then increase to 200mg  by mouth daily for 1 more week.  Then increase to 300mg  by mouth daily thereafter. Patient not taking: Reported on 04/08/2019 04/07/15   Carrie Mew, MD  oxyCODONE-acetaminophen (ROXICET) 5-325 MG per tablet Take 1 tablet by mouth every 6 (six) hours as needed for severe pain. Patient not taking: Reported on 04/08/2019 04/07/15   Carrie Mew, MD     ALLERGIES:  Allergies  Allergen Reactions  . Ciprocin-Fluocin-Procin [Fluocinolone] Anaphylaxis    Hip hurt     SOCIAL HISTORY:  Social History   Socioeconomic History  . Marital status: Married    Spouse name: Not on file  . Number of children: Not on file  . Years of education: Not on file  . Highest education level: Not on file  Occupational History  . Not on file  Social Needs  . Financial resource strain: Not on file  . Food insecurity    Worry: Not on file    Inability: Not on file  . Transportation needs    Medical: Not on file    Non-medical: Not on file  Tobacco Use  . Smoking status: Current Every Day Smoker  . Smokeless tobacco: Never Used  Substance and Sexual Activity  . Alcohol use: Yes  . Drug use: Not on file  . Sexual activity: Not on file  Lifestyle  . Physical activity    Days per week: Not on file    Minutes per session: Not on file  .  Stress: Not on file  Relationships  . Social Herbalist on phone: Not on file    Gets together: Not on file    Attends religious service: Not on file    Active member of club or organization: Not on file    Attends meetings of clubs or organizations: Not on file    Relationship status: Not on file  . Intimate partner violence    Fear of current or ex partner: Not on file    Emotionally abused: Not on file    Physically abused: Not on file    Forced sexual activity: Not on file  Other Topics Concern  . Not on file  Social History Narrative  . Not on file     FAMILY HISTORY:   No family history on file.  Otherwise negative.   REVIEW OF SYSTEMS:  Review of Systems  Constitutional: Negative for chills and fever.  HENT: Negative for congestion and sore throat.   Respiratory: Negative for cough and shortness of breath.   Cardiovascular: Negative for chest pain and palpitations.  Gastrointestinal: Positive for abdominal pain (Left Groin). Negative for constipation, diarrhea, nausea and vomiting.  Genitourinary: Negative for dysuria and urgency.  All other systems reviewed and are negative.   VITAL SIGNS:  Temp:  [99.2 F (37.3 C)] 99.2 F (37.3 C) (09/15 2328) Pulse Rate:  [69-88] 85 (09/16 0808) Resp:  [16-18] 16 (09/16 0800) BP: (143-172)/(91-121) 172/121 (09/16 0808) SpO2:  [91 %-96 %] 93 % (09/16 0800) Weight:  [86.2 kg] 86.2 kg (09/15 2329)     Height: 6' (182.9 cm) Weight: 86.2 kg BMI (Calculated): 25.76   PHYSICAL EXAM:  Physical Exam Exam conducted with a chaperone present.  Constitutional:      General: He is not in acute distress.    Appearance: Normal appearance. He is not ill-appearing.  HENT:     Head: Normocephalic and atraumatic.  Eyes:     Extraocular Movements: Extraocular movements intact.     Conjunctiva/sclera: Conjunctivae normal.     Pupils: Pupils are equal, round, and reactive to light.  Cardiovascular:     Rate and Rhythm: Normal rate and regular rhythm.     Pulses: Normal pulses.     Heart sounds: No murmur. No friction rub. No gallop.   Pulmonary:     Effort: Pulmonary effort is normal. No respiratory distress.     Breath sounds: No wheezing or rhonchi.  Abdominal:     General: Abdomen is flat. There is no distension.     Tenderness: There is abdominal tenderness. There is no guarding or rebound.     Hernia: A hernia is present. Hernia is present in the left inguinal area. There is no hernia in the right inguinal area.     Comments: Large left inguinal hernia, soft to palpation, able to manually reduce the bowel, still  has likely fat tissue that is unable to reducer, NO EVIDENCE OF INCACERATION  Genitourinary:    Penis: Circumcised.      Scrotum/Testes: Normal.        Right: Mass or tenderness not present.        Left: Mass or tenderness not present.  Musculoskeletal:     Right lower leg: No edema.     Left lower leg: No edema.  Skin:    General: Skin is warm and dry.  Neurological:     General: No focal deficit present.     Mental Status: He is alert and  oriented to person, place, and time.  Psychiatric:        Mood and Affect: Mood normal.        Behavior: Behavior normal.     INTAKE/OUTPUT:  This shift: No intake/output data recorded.  Last 2 shifts: @IOLAST2SHIFTS @  Labs:  CBC Latest Ref Rng & Units 04/07/2019 04/07/2015  WBC 4.0 - 10.5 K/uL 6.1 11.4(H)  Hemoglobin 13.0 - 17.0 g/dL 16.9 14.5  Hematocrit 39.0 - 52.0 % 49.6 42.2  Platelets 150 - 400 K/uL 199 238   CMP Latest Ref Rng & Units 04/07/2019 04/07/2015  Glucose 70 - 99 mg/dL 89 114(H)  BUN 8 - 23 mg/dL 7(L) 12  Creatinine 0.61 - 1.24 mg/dL 0.82 0.82  Sodium 135 - 145 mmol/L 138 135  Potassium 3.5 - 5.1 mmol/L 4.1 4.3  Chloride 98 - 111 mmol/L 102 98(L)  CO2 22 - 32 mmol/L 25 28  Calcium 8.9 - 10.3 mg/dL 9.0 9.5  Total Protein 6.5 - 8.1 g/dL 7.9 8.2(H)  Total Bilirubin 0.3 - 1.2 mg/dL 1.2 1.9(H)  Alkaline Phos 38 - 126 U/L 64 62  AST 15 - 41 U/L 33 19  ALT 0 - 44 U/L 25 12(L)     Imaging studies:   CT Abdomen/Pelvis (04/08/2019) personally reviewed which shows large left inguinal hernia with fat and bowel but no evidence of incarceration, and radiologist report reviewed:  IMPRESSION: 1. Fat and bowel containing left inguinal hernia. No evidence of mechanical obstruction or vascular compromise to the herniated fat or bowel segment. Overlying compress noted. 2. Small fat containing right inguinal hernia. 3. Patchy ground-glass opacities in the posterior right lower lobe, could reflect acute infection/inflammation or  possible focal atelectatic change. 4. Aortic Atherosclerosis (ICD10-I70.0).   Assessment/Plan:  68 y.o. male with increasingly symptomatic left inguinal hernia with fat and bowel containing, no evidence of incaceration   - The bowel is able to be reduced in the ED with manual manipulation. However given the increase in symptoms we will schedule him for outpatient follow up tomorrow (09/17) and schedule him for surgical repair on Monday (09/21) with Dr Celine Ahr. The patient and his wife are understanding that there are no signs of incarceration which would warrant emergent intervention. We will send him home with pain control and he was educated on the importance of abstaining from alcohol use. All of their questions and concerns were addressed and answered. He will be swabbed for COVID prior to discharge.   -- Edison Simon, PA-C [facility] 04/08/2019, 9:23 AM [phone] M-F: 7am - 4pm   I saw and evaluated the patient.  I agree with the above documentation, exam, and plan, which I have edited where appropriate. Fredirick Maudlin  12:12 PM

## 2019-04-09 ENCOUNTER — Ambulatory Visit (INDEPENDENT_AMBULATORY_CARE_PROVIDER_SITE_OTHER): Payer: Medicare HMO | Admitting: Physician Assistant

## 2019-04-09 ENCOUNTER — Encounter: Payer: Self-pay | Admitting: Physician Assistant

## 2019-04-09 ENCOUNTER — Other Ambulatory Visit: Payer: Self-pay

## 2019-04-09 VITALS — BP 155/101 | HR 92 | Temp 97.7°F | Ht 73.0 in | Wt 184.2 lb

## 2019-04-09 DIAGNOSIS — K4090 Unilateral inguinal hernia, without obstruction or gangrene, not specified as recurrent: Secondary | ICD-10-CM | POA: Diagnosis not present

## 2019-04-09 LAB — SARS CORONAVIRUS 2 (TAT 6-24 HRS): SARS Coronavirus 2: NEGATIVE

## 2019-04-09 NOTE — Progress Notes (Signed)
[facility] SUR[facility] CL[facility]IC NEW PATIENT  Referring provider:  Patient seen in ED on 09/16 by myself and referred here for surgical planning   HISTORY OF PRESENT ILLNESS (HPI):  68 y.o. male presents for evaluation of left inguinal hernia. Patient seen in the [facility] ED on 09/16. Patient reports about 7 years ago he sneezed and noticed a spot in his left groin. This was not bothersome to him for some time. He thinks about 3 years ago the area started to get bigger and bigger but would reduce when he would lay flat. He was apparently evaluated at [facility] for this at some point but was told "he doesn't have a hernia." Eventually he reports the area stopped reducing on its own but it was not bothersome. Over the last 3 days, he reports and increase in discomfort in the area which was progressively worsening. The pain is worse with movement and palpation. Nothing tried to make the pain better. No issues with fever, chills, nausea, emesis, distension, or bowel changes. Only previous abdominal surgery is appendectomy. Work up in the ED was concerning for increasingly symptomatic left inguinal hernia without evidence of incarceration or strangulation which could not be reduced in the ED. Of note, he does consume about 18-24 beers a night per his wife.    PAST MEDICAL HISTORY (PMH):  Past Medical History:  Diagnosis Date  . Anxiety   . Depression   . Gout      PAST SURGICAL HISTORY ([place]):  Past Surgical History:  Procedure Laterality Date  . APPENDECTOMY  1994  . NASAL SINUS SURGERY  2015   [facility]     MEDICATIONS:  Prior to Admission medications   Medication Sig Start Date End Date Taking? Authorizing Provider  allopurinol (ZYLOPRIM) 100 MG tablet Take 1 tablet (100 mg total) by mouth daily. Take 100mg  by mouth daily for 1 week.  Then increase to 200mg  by mouth daily for 1 more week.  Then increase to 300mg  by mouth daily thereafter. 04/07/15  Yes Carrie Mew, MD  ALPRAZolam Duanne Moron)  0.5 MG tablet Take 0.5 mg by mouth daily as needed for anxiety.   Yes [provider]  HYDROcodone-acetaminophen (NORCO/VICODIN) 5-325 MG tablet Take 1 tablet by mouth every 6 (six) hours as needed for moderate pain or severe pain. 04/08/19 04/07/20 Yes Tylene Fantasia, PA-C     ALLERGIES:  Allergies  Allergen Reactions  . Ciprocin-Fluocin-Procin [Fluocinolone] Anaphylaxis    Hip hurt  . Duloxetine Diarrhea, Nausea And Vomiting and Other (See Comments)    Altered mental status     SOCIAL HISTORY:  Social History   Socioeconomic History  . Marital status: Married    Spouse name: Not on file  . Number of children: Not on file  . Years of education: Not on file  . Highest education level: Not on file  Occupational History  . Not on file  Social Needs  . Financial resource strain: Not on file  . Food insecurity    Worry: Not on file    Inability: Not on file  . Transportation needs    Medical: Not on file    Non-medical: Not on file  Tobacco Use  . Smoking status: Current Every Day Smoker    Packs/day: 2.00  . Smokeless tobacco: Never Used  Substance and Sexual Activity  . Alcohol use: Yes    Alcohol/week: 18.0 standard drinks    Types: 18 Cans of beer per week  . Drug use: Not on  file  . Sexual activity: Not on file  Lifestyle  . Physical activity    Days per week: Not on file    Minutes per session: Not on file  . Stress: Not on file  Relationships  . Social Herbalist on phone: Not on file    Gets together: Not on file    Attends religious service: Not on file    Active member of club or organization: Not on file    Attends meetings of clubs or organizations: Not on file    Relationship status: Not on file  . Intimate partner violence    Fear of current or ex partner: Not on file    Emotionally abused: Not on file    Physically abused: Not on file    Forced sexual activity: Not on file  Other Topics Concern  . Not on file  Social History  Narrative  . Not on file    The patient currently resides (home / rehab facility / nursing home): Home The patient normally is (ambulatory / bedbound): Ambulatory  FAMILY HISTORY:  Family History  Problem Relation Age of Onset  . Skin cancer Father   . Multiple sclerosis Sister   . Non-Hodgkin's lymphoma Brother     Otherwise negative/non-contributory.   REVIEW OF SYSTEMS:  Constitutional: denies any other weight loss, fever, chills, or sweats  Eyes: denies any other vision changes, history of eye injury  ENT: denies sore throat, hearing problems  Respiratory: denies shortness of breath, wheezing  Cardiovascular: denies chest pain, palpitations  Gastrointestinal: + left groin pain, N/V, or diarrhea/and bowel function as per HPI Musculoskeletal: denies any other joint pains or cramps  Skin: Denies any other rashes or skin discolorations Neurological: denies any other headache, dizziness, weakness  Psychiatric: Denies any other depression, anxiety   All other review of systems were otherwise negative    VITAL SIGNS:  @VSRANGES @     Height: 6\' 1"  (185.4 cm) Weight: 184 lb 3.2 oz (83.6 kg) BMI (Calculated): 24.31   PHYSICAL EXAM:  Constitutional:  -- Normal body habitus  -- Awake, alert, and oriented x3  Eyes:  -- Pupils equally round and reactive to light  -- No scleral icterus  Ear, nose, throat:  -- No jugular venous distension -- No nasal drainage, bleeding Pulmonary:  -- No crackles  -- Equal breath sounds bilaterally -- Breathing non-labored at rest Cardiovascular:  -- S1, S2 present  -- No pericardial rubs  Gastrointestinal:  -- Abdomen soft, non-tenderness to palpation, non-distended, no guarding/rebound tenderness -- Left inguinal hernia, soft to palpation, non-tender, bowel is easily reducible, no evidence of strangulation or incarceration Musculoskeletal and Integumentary:  -- Wounds or skin discoloration: None -- Extremities: B/L UE and LE FROM, hands  and feet warm, no edema  Neurologic:  -- Motor function: Intact and symmetric -- Sensation: Intact and symmetric   Labs:  CBC    Component Value Date/Time   WBC 6.1 04/07/2019 2333   RBC 4.98 04/07/2019 2333   HGB 16.9 04/07/2019 2333   HCT 49.6 04/07/2019 2333   PLT 199 04/07/2019 2333   MCV 99.6 04/07/2019 2333   MCH 33.9 04/07/2019 2333   MCHC 34.1 04/07/2019 2333   RDW 13.1 04/07/2019 2333   LYMPHSABS 2.3 04/07/2019 2333   MONOABS 0.5 04/07/2019 2333   EOSABS 0.3 04/07/2019 2333   BASOSABS 0.1 04/07/2019 2333     Imaging studies:   CT Abdomen/Pelvis (04/08/2019) reviewed yesterday in the ED:  IMPRESSION: 1. Fat and bowel containing left inguinal hernia. No evidence of mechanical obstruction or vascular compromise to the herniated fat or bowel segment. Overlying compress noted. 2. Small fat containing right inguinal hernia. 3. Patchy ground-glass opacities in the posterior right lower lobe, could reflect acute infection/inflammation or possible focal atelectatic change. 4. Aortic Atherosclerosis (ICD10-I70.0).   Assessment/Plan:  68 y.o. male with increasingly symptomatic left inguinal hernia with fat and bowel containing, no evidence of incaceration   - Pain control prn  - Scheduled for left inguinal hernia repair Monday (09/21) with Dr Celine Ahr   - All risks, benefits, and alternatives to above elective procedure(s) were discussed with the patient and his family, all of their questions were answered to their expressed satisfaction, patient expresses he wishes to proceed, and informed consent was obtained.   All of the above recommendations were discussed with the patient and patient's family, and all of patient's and family's questions were answered to his expressed satisfaction.  Thank you for the opportunity to participate in this patient's care.  -- Edison Simon, PA-C [facility] 04/09/2019, 12:34 PM [phone] M-F: 7am - 4pm

## 2019-04-09 NOTE — Patient Instructions (Signed)
You have chose to have your hernia repaired. This will be done by [facility] on 04/13/19 at [facility].  Please see your (blue) Pre-care information that you have been given today.  You will need to arrange to be out of work for 2 weeks and then return with a lifting restrictions for 4 more weeks. Please send any FMLA paperwork prior to surgery and we will fill this out and fax it back to your employer within 3 business days.  You may have a bruise in your groin and also swelling and brusing in your testicle area. You may use ice 4-5 times daily for 15-20 minutes each time. Make sure that you place a barrier between you and the ice pack. To decrease the swelling, you may roll up a bath towel and place it vertically in between your thighs with your testicles resting on the towel. You will want to keep this area elevated as much as possible for several days following surgery.    Inguinal Hernia, Adult Muscles help keep everything in the body in its proper place. But if a weak spot in the muscles develops, something can poke through. That is called a hernia. When this happens in the lower part of the belly (abdomen), it is called an inguinal hernia. (It takes its name from a part of the body in this region called the inguinal canal.) A weak spot in the wall of muscles lets some fat or part of the small intestine bulge through. An inguinal hernia can develop at any age. Men get them more often than women. CAUSES  In adults, an inguinal hernia develops over time.  It can be triggered by:  Suddenly straining the muscles of the lower abdomen.  Lifting heavy objects.  Straining to have a bowel movement. Difficult bowel movements (constipation) can lead to this.  Constant coughing. This may be caused by smoking or lung disease.  Being overweight.  Being pregnant.  Working at a job that requires long periods of standing or heavy lifting.  Having had an inguinal hernia before. One type can be an  emergency situation. It is called a strangulated inguinal hernia. It develops if part of the small intestine slips through the weak spot and cannot get back into the abdomen. The blood supply can be cut off. If that happens, part of the intestine may die. This situation requires emergency surgery. SYMPTOMS  Often, a small inguinal hernia has no symptoms. It is found when a healthcare provider does a physical exam. Larger hernias usually have symptoms.   In adults, symptoms may include:  A lump in the groin. This is easier to see when the person is standing. It might disappear when lying down.  In men, a lump in the scrotum.  Pain or burning in the groin. This occurs especially when lifting, straining or coughing.  A dull ache or feeling of pressure in the groin.  Signs of a strangulated hernia can include:  A bulge in the groin that becomes very painful and tender to the touch.  A bulge that turns red or purple.  Fever, nausea and vomiting.  Inability to have a bowel movement or to pass gas. DIAGNOSIS  To decide if you have an inguinal hernia, a healthcare provider will probably do a physical examination.  This will include asking questions about any symptoms you have noticed.  The healthcare provider might feel the groin area and ask you to cough. If an inguinal hernia is felt, the healthcare provider may  try to slide it back into the abdomen.  Usually no other tests are needed. TREATMENT  Treatments can vary. The size of the hernia makes a difference. Options include:  Watchful waiting. This is often suggested if the hernia is small and you have had no symptoms.  No medical procedure will be done unless symptoms develop.  You will need to watch closely for symptoms. If any occur, contact your healthcare provider right away.  Surgery. This is used if the hernia is larger or you have symptoms.  Open surgery. This is usually an outpatient procedure (you will not stay  overnight in a hospital). An cut (incision) is made through the skin in the groin. The hernia is put back inside the abdomen. The weak area in the muscles is then repaired by herniorrhaphy or hernioplasty. Herniorrhaphy: in this type of surgery, the weak muscles are sewn back together. Hernioplasty: a patch or mesh is used to close the weak area in the abdominal wall.  Laparoscopy. In this procedure, a surgeon makes small incisions. A thin tube with a tiny video camera (called a laparoscope) is put into the abdomen. The surgeon repairs the hernia with mesh by looking with the video camera and using two long instruments. HOME CARE INSTRUCTIONS   After surgery to repair an inguinal hernia:  You will need to take pain medicine prescribed by your healthcare provider. Follow all directions carefully.  You will need to take care of the wound from the incision.  Your activity will be restricted for awhile. This will probably include no heavy lifting for several weeks. You also should not do anything too active for a few weeks. When you can return to work will depend on the type of job that you have.  During "watchful waiting" periods, you should:  Maintain a healthy weight.  Eat a diet high in fiber (fruits, vegetables and whole grains).  Drink plenty of fluids to avoid constipation. This means drinking enough water and other liquids to keep your urine clear or pale yellow.  Do not lift heavy objects.  Do not stand for long periods of time.  Quit smoking. This should keep you from developing a frequent cough. SEEK MEDICAL CARE IF:   A bulge develops in your groin area.  You feel pain, a burning sensation or pressure in the groin. This might be worse if you are lifting or straining.  You develop a fever of more than 100.5 F (38.1 C). SEEK IMMEDIATE MEDICAL CARE IF:   Pain in the groin increases suddenly.  A bulge in the groin gets bigger suddenly and does not go down.  For men, there  is sudden pain in the scrotum. Or, the size of the scrotum increases.  A bulge in the groin area becomes red or purple and is painful to touch.  You have nausea or vomiting that does not go away.  You feel your heart beating much faster than normal.  You cannot have a bowel movement or pass gas.  You develop a fever of more than 102.0 F (38.9 C).   This information is not intended to replace advice given to you by your health care provider. Make sure you discuss any questions you have with your health care provider.   Document Released: 11/25/2008 Document Revised: 10/01/2011 Document Reviewed: 01/10/2015 Elsevier Interactive Patient Education [employer].

## 2019-04-10 ENCOUNTER — Encounter
Admission: RE | Admit: 2019-04-10 | Discharge: 2019-04-10 | Disposition: A | Discharge status: Home / Self Care | Payer: Medicare HMO | Source: Ambulatory Visit | Attending: General Surgery | Admitting: General Surgery

## 2019-04-10 ENCOUNTER — Other Ambulatory Visit: Payer: Self-pay

## 2019-04-10 ENCOUNTER — Other Ambulatory Visit: Payer: Self-pay | Admitting: General Surgery

## 2019-04-10 DIAGNOSIS — K4090 Unilateral inguinal hernia, without obstruction or gangrene, not specified as recurrent: Secondary | ICD-10-CM | POA: Insufficient documentation

## 2019-04-10 DIAGNOSIS — Z01810 Encounter for preprocedural cardiovascular examination: Secondary | ICD-10-CM | POA: Diagnosis not present

## 2019-04-10 DIAGNOSIS — I10 Essential (primary) hypertension: Secondary | ICD-10-CM | POA: Diagnosis not present

## 2019-04-10 NOTE — Pre-Procedure Instructions (Signed)
ECG 12 lead10/30/2014 [facility] Component Name Value Ref Range  EKG Systolic BP  mmHg  EKG Diastolic BP  mmHg  EKG Ventricular Rate 74 BPM  EKG Atrial Rate 74 BPM  EKG P-R Interval 158 ms  EKG QRS Duration 112 ms  EKG Q-T Interval 382 ms  EKG QTC Calculation 424 ms  EKG Calculated P Axis 33 degrees  EKG Calculated R Axis  degrees  EKG Calculated T Axis 36 degrees  Result Narrative  NORMAL SINUS RHYTHM VOLTAGE CRITERIA FOR LEFT VENTRICULAR HYPERTROPHY ABNORMAL ECG NO PREVIOUS ECGS AVAILABLE Confirmed by Tamala Julian MD, [place] ([id number]) on 05/21/2013 6:50:18 PM  Other Result Information  Interface, Rad Results In - 05/21/2013  6:50 PM EDT NORMAL SINUS RHYTHM VOLTAGE CRITERIA FOR LEFT VENTRICULAR HYPERTROPHY ABNORMAL ECG NO PREVIOUS ECGS AVAILABLE Confirmed by Tamala Julian  MD, [place] ([id number]) on 05/21/2013 6:50:18 PM

## 2019-04-10 NOTE — Patient Instructions (Signed)
Your procedure is scheduled on: Monday 04/13/19 Report to [place]. To find out your arrival time please call [phone] between 1PM - 3PM on Today.  Remember: Instructions that are not followed completely may result in serious medical risk, up to and including death, or upon the discretion of your surgeon and anesthesiologist your surgery may need to be rescheduled.     _X__ 1. Do not eat food after midnight the night before your procedure.                 No gum chewing or hard candies. You may drink clear liquids up to 2 hours                 before you are scheduled to arrive for your surgery- DO not drink clear                 liquids within 2 hours of the start of your surgery.                 Clear Liquids include:  water, apple juice without pulp, clear carbohydrate                 drink such as Clearfast or Gatorade, Black Coffee or Tea (Do not add                 anything to coffee or tea). Diabetics water only  __X__2.  On the morning of surgery brush your teeth with toothpaste and water, you                 may rinse your mouth with mouthwash if you wish.  Do not swallow any              toothpaste of mouthwash.     _X__ 3.  No Alcohol for 24 hours before or after surgery.   _X__ 4.  Do Not Smoke or use e-cigarettes For 24 Hours Prior to Your Surgery.                 Do not use any chewable tobacco products for at least 6 hours prior to                 surgery.  ____  5.  Bring all medications with you on the day of surgery if instructed.   __X__  6.  Notify your doctor if there is any change in your medical condition      (cold, fever, infections).     Do not wear jewelry, make-up, hairpins, clips or nail polish. Do not wear lotions, powders, or perfumes.  Do not shave 48 hours prior to surgery. Men may shave face and neck. Do not bring valuables to the hospital.    [facility] is not responsible for any  belongings or valuables.  Contacts, dentures/partials or body piercings may not be worn into surgery. Bring a case for your contacts, glasses or hearing aids, a denture cup will be supplied. Leave your suitcase in the car. After surgery it may be brought to your room. For patients admitted to the hospital, discharge time is determined by your treatment team.   Patients discharged the day of surgery will not be allowed to drive home.   Please read over the following fact sheets that you were given:   MRSA Information  __X__ Take these medicines the morning of surgery with A SIP OF WATER:  1. ALPRAZolam (XANAX  2. HYDROcodone-acetaminophen (NORCO/VICODIN only if needed for pain  3.   4.  5.  6.  ____ Fleet Enema (as directed)   __X__ Use CHG Soap/SAGE wipes as directed  ____ Use inhalers on the day of surgery  ____ Stop metformin/Janumet/Farxiga 2 days prior to surgery    ____ Take 1/2 of usual insulin dose the night before surgery. No insulin the morning          of surgery.   ____ Stop Blood Thinners Coumadin/Plavix/Xarelto/Pleta/Pradaxa/Eliquis/Effient/Aspirin  on   Or contact your Surgeon, Cardiologist or Medical Doctor regarding  ability to stop your blood thinners  __X__ Stop Anti-inflammatories 7 days before surgery such as Advil, Ibuprofen, Motrin,  BC or Goodies Powder, Naprosyn, Naproxen, Aleve, Aspirin    __X__ Stop all herbal supplements, fish oil or vitamin E until after surgery.    ____ Bring C-Pap to the hospital.

## 2019-04-13 ENCOUNTER — Observation Stay
Admission: RE | Admit: 2019-04-13 | Discharge: 2019-04-14 | Disposition: A | Discharge status: Home / Self Care | Payer: Medicare HMO | Attending: General Surgery | Admitting: General Surgery

## 2019-04-13 ENCOUNTER — Other Ambulatory Visit: Payer: Self-pay

## 2019-04-13 ENCOUNTER — Ambulatory Visit: Payer: Medicare HMO | Admitting: Certified Registered"

## 2019-04-13 ENCOUNTER — Encounter
Admission: RE | Disposition: A | Discharge status: Home / Self Care | Payer: Self-pay | Source: Home / Self Care | Attending: General Surgery

## 2019-04-13 DIAGNOSIS — R531 Weakness: Secondary | ICD-10-CM | POA: Diagnosis not present

## 2019-04-13 DIAGNOSIS — Z888 Allergy status to other drugs, medicaments and biological substances status: Secondary | ICD-10-CM | POA: Insufficient documentation

## 2019-04-13 DIAGNOSIS — D176 Benign lipomatous neoplasm of spermatic cord: Secondary | ICD-10-CM | POA: Insufficient documentation

## 2019-04-13 DIAGNOSIS — R69 Illness, unspecified: Secondary | ICD-10-CM | POA: Diagnosis not present

## 2019-04-13 DIAGNOSIS — Z881 Allergy status to other antibiotic agents status: Secondary | ICD-10-CM | POA: Insufficient documentation

## 2019-04-13 DIAGNOSIS — K4090 Unilateral inguinal hernia, without obstruction or gangrene, not specified as recurrent: Secondary | ICD-10-CM | POA: Diagnosis not present

## 2019-04-13 DIAGNOSIS — M109 Gout, unspecified: Secondary | ICD-10-CM | POA: Insufficient documentation

## 2019-04-13 DIAGNOSIS — Z8719 Personal history of other diseases of the digestive system: Secondary | ICD-10-CM

## 2019-04-13 DIAGNOSIS — F17210 Nicotine dependence, cigarettes, uncomplicated: Secondary | ICD-10-CM | POA: Diagnosis not present

## 2019-04-13 DIAGNOSIS — K4030 Unilateral inguinal hernia, with obstruction, without gangrene, not specified as recurrent: Principal | ICD-10-CM

## 2019-04-13 DIAGNOSIS — F419 Anxiety disorder, unspecified: Secondary | ICD-10-CM | POA: Diagnosis not present

## 2019-04-13 DIAGNOSIS — Z9889 Other specified postprocedural states: Secondary | ICD-10-CM

## 2019-04-13 HISTORY — PX: INGUINAL HERNIA REPAIR: SHX194

## 2019-04-13 SURGERY — REPAIR, HERNIA, INGUINAL, ADULT
Anesthesia: General | Site: Inguinal | Laterality: Left

## 2019-04-13 MED ORDER — ALPRAZOLAM 0.5 MG PO TABS
ORAL_TABLET | ORAL | Status: AC
  Filled 2019-04-13: qty 2

## 2019-04-13 MED ORDER — LIDOCAINE-EPINEPHRINE 1 %-1:100000 IJ SOLN
INTRAMUSCULAR | Status: AC
  Filled 2019-04-13: qty 1

## 2019-04-13 MED ORDER — LACTATED RINGERS IV SOLN
INTRAVENOUS | Status: DC
  Administered 2019-04-13: 20:00:00 via INTRAVENOUS

## 2019-04-13 MED ORDER — BUPIVACAINE LIPOSOME 1.3 % IJ SUSP
INTRAMUSCULAR | Status: DC | PRN
  Administered 2019-04-13: 20 mL

## 2019-04-13 MED ORDER — LIDOCAINE HCL 2 % IJ SOLN
INTRAMUSCULAR | Status: AC
  Filled 2019-04-13: qty 20

## 2019-04-13 MED ORDER — LACTATED RINGERS IV SOLN
INTRAVENOUS | Status: DC | PRN
  Administered 2019-04-13: 09:00:00 via INTRAVENOUS

## 2019-04-13 MED ORDER — CHLORHEXIDINE GLUCONATE CLOTH 2 % EX PADS: 6.0000 | MEDICATED_PAD | Freq: Once | CUTANEOUS | Status: DC

## 2019-04-13 MED ORDER — ADULT MULTIVITAMIN W/MINERALS CH
1.0000 | ORAL_TABLET | Freq: Every day | ORAL | Status: DC
  Administered 2019-04-13 – 2019-04-14 (×2): 1 via ORAL
  Filled 2019-04-13 (×2): qty 1

## 2019-04-13 MED ORDER — OXYCODONE HCL 5 MG/5ML PO SOLN: 5.0000 mg | Freq: Once | ORAL | Status: AC | PRN

## 2019-04-13 MED ORDER — ACETAMINOPHEN 500 MG PO TABS
1000.0000 mg | ORAL_TABLET | Freq: Four times a day (QID) | ORAL | Status: DC
  Administered 2019-04-13 – 2019-04-14 (×4): 1000 mg via ORAL
  Filled 2019-04-13 (×4): qty 2

## 2019-04-13 MED ORDER — KETOROLAC TROMETHAMINE 15 MG/ML IJ SOLN: 15.0000 mg | Freq: Four times a day (QID) | INTRAMUSCULAR | Status: DC | PRN

## 2019-04-13 MED ORDER — PROPOFOL 10 MG/ML IV BOLUS
INTRAVENOUS | Status: AC
  Filled 2019-04-13: qty 40

## 2019-04-13 MED ORDER — SUCCINYLCHOLINE CHLORIDE 20 MG/ML IJ SOLN
INTRAMUSCULAR | Status: AC
  Filled 2019-04-13: qty 1

## 2019-04-13 MED ORDER — FOLIC ACID 1 MG PO TABS
1.0000 mg | ORAL_TABLET | Freq: Every day | ORAL | Status: DC
  Administered 2019-04-13 – 2019-04-14 (×2): 1 mg via ORAL
  Filled 2019-04-13 (×2): qty 1

## 2019-04-13 MED ORDER — LIDOCAINE-EPINEPHRINE 1 %-1:100000 IJ SOLN
INTRAMUSCULAR | Status: DC | PRN
  Administered 2019-04-13: 10 mL

## 2019-04-13 MED ORDER — MEPERIDINE HCL 50 MG/ML IJ SOLN: 6.2500 mg | INTRAMUSCULAR | Status: DC | PRN

## 2019-04-13 MED ORDER — OXYCODONE HCL 5 MG PO TABS
5.0000 mg | ORAL_TABLET | Freq: Once | ORAL | Status: AC | PRN
  Administered 2019-04-13: 12:00:00 5 mg via ORAL

## 2019-04-13 MED ORDER — KETOROLAC TROMETHAMINE 30 MG/ML IJ SOLN: 30.0000 mg | Freq: Once | INTRAMUSCULAR | Status: DC

## 2019-04-13 MED ORDER — GABAPENTIN 300 MG PO CAPS
ORAL_CAPSULE | ORAL | Status: AC
  Administered 2019-04-13: 08:00:00 300 mg via ORAL
  Filled 2019-04-13: qty 1

## 2019-04-13 MED ORDER — GLYCOPYRROLATE 0.2 MG/ML IJ SOLN
INTRAMUSCULAR | Status: DC | PRN
  Administered 2019-04-13: 0.2 mg via INTRAVENOUS

## 2019-04-13 MED ORDER — SUCCINYLCHOLINE CHLORIDE 20 MG/ML IJ SOLN
INTRAMUSCULAR | Status: DC | PRN
  Administered 2019-04-13: 100 mg via INTRAVENOUS

## 2019-04-13 MED ORDER — ONDANSETRON 4 MG PO TBDP
4.0000 mg | ORAL_TABLET | Freq: Four times a day (QID) | ORAL | Status: DC | PRN
  Administered 2019-04-14: 4 mg via ORAL
  Filled 2019-04-13: qty 1

## 2019-04-13 MED ORDER — PHENYLEPHRINE HCL (PRESSORS) 10 MG/ML IV SOLN
INTRAVENOUS | Status: DC | PRN
  Administered 2019-04-13 (×3): 100 ug via INTRAVENOUS

## 2019-04-13 MED ORDER — PROPOFOL 10 MG/ML IV BOLUS
INTRAVENOUS | Status: DC | PRN
  Administered 2019-04-13: 150 mg via INTRAVENOUS

## 2019-04-13 MED ORDER — CELECOXIB 200 MG PO CAPS
ORAL_CAPSULE | ORAL | Status: AC
  Administered 2019-04-13: 08:00:00 200 mg via ORAL
  Filled 2019-04-13: qty 1

## 2019-04-13 MED ORDER — THIAMINE HCL 100 MG/ML IJ SOLN: 100.0000 mg | Freq: Every day | INTRAMUSCULAR | Status: DC

## 2019-04-13 MED ORDER — BUPIVACAINE HCL (PF) 0.25 % IJ SOLN
INTRAMUSCULAR | Status: DC | PRN
  Administered 2019-04-13: 10 mL

## 2019-04-13 MED ORDER — FAMOTIDINE 20 MG PO TABS
20.0000 mg | ORAL_TABLET | Freq: Once | ORAL | Status: AC
  Administered 2019-04-13: 08:00:00 20 mg via ORAL

## 2019-04-13 MED ORDER — GABAPENTIN 300 MG PO CAPS
300.0000 mg | ORAL_CAPSULE | ORAL | Status: AC
  Administered 2019-04-13: 08:00:00 300 mg via ORAL

## 2019-04-13 MED ORDER — FENTANYL CITRATE (PF) 100 MCG/2ML IJ SOLN
25.0000 ug | INTRAMUSCULAR | Status: DC | PRN
  Administered 2019-04-13: 12:00:00 25 ug via INTRAVENOUS
  Administered 2019-04-13 (×2): 50 ug via INTRAVENOUS

## 2019-04-13 MED ORDER — ACETAMINOPHEN 500 MG PO TABS
1000.0000 mg | ORAL_TABLET | ORAL | Status: AC
  Administered 2019-04-13: 08:00:00 1000 mg via ORAL

## 2019-04-13 MED ORDER — CEFAZOLIN SODIUM-DEXTROSE 2-4 GM/100ML-% IV SOLN
2.0000 g | INTRAVENOUS | Status: AC
  Administered 2019-04-13: 09:00:00 2 g via INTRAVENOUS

## 2019-04-13 MED ORDER — BUPIVACAINE LIPOSOME 1.3 % IJ SUSP
INTRAMUSCULAR | Status: AC
  Filled 2019-04-13: qty 20

## 2019-04-13 MED ORDER — ONDANSETRON HCL 4 MG/2ML IJ SOLN: 4.0000 mg | Freq: Four times a day (QID) | INTRAMUSCULAR | Status: DC | PRN

## 2019-04-13 MED ORDER — SUGAMMADEX SODIUM 500 MG/5ML IV SOLN
INTRAVENOUS | Status: DC | PRN
  Administered 2019-04-13: 340 mg via INTRAVENOUS

## 2019-04-13 MED ORDER — OXYCODONE HCL 5 MG PO TABS
ORAL_TABLET | ORAL | Status: AC
  Administered 2019-04-13: 5 mg via ORAL
  Filled 2019-04-13: qty 1

## 2019-04-13 MED ORDER — FENTANYL CITRATE (PF) 100 MCG/2ML IJ SOLN
INTRAMUSCULAR | Status: AC
  Administered 2019-04-13: 25 ug via INTRAVENOUS
  Filled 2019-04-13: qty 2

## 2019-04-13 MED ORDER — DEXMEDETOMIDINE HCL IN NACL 200 MCG/50ML IV SOLN
INTRAVENOUS | Status: DC | PRN
  Administered 2019-04-13: 8 ug via INTRAVENOUS

## 2019-04-13 MED ORDER — PROMETHAZINE HCL 25 MG/ML IJ SOLN: 6.2500 mg | INTRAMUSCULAR | Status: DC | PRN

## 2019-04-13 MED ORDER — HYDRALAZINE HCL 20 MG/ML IJ SOLN
5.0000 mg | Freq: Once | INTRAMUSCULAR | Status: AC
  Administered 2019-04-13: 13:00:00 5 mg via INTRAVENOUS

## 2019-04-13 MED ORDER — HYDRALAZINE HCL 20 MG/ML IJ SOLN
INTRAMUSCULAR | Status: AC
  Administered 2019-04-13: 13:00:00 5 mg via INTRAVENOUS
  Filled 2019-04-13: qty 1

## 2019-04-13 MED ORDER — FENTANYL CITRATE (PF) 100 MCG/2ML IJ SOLN
INTRAMUSCULAR | Status: AC
  Administered 2019-04-13: 12:00:00 25 ug via INTRAVENOUS
  Filled 2019-04-13: qty 2

## 2019-04-13 MED ORDER — ACETAMINOPHEN 500 MG PO TABS
ORAL_TABLET | ORAL | Status: AC
  Administered 2019-04-13: 1000 mg via ORAL
  Filled 2019-04-13: qty 2

## 2019-04-13 MED ORDER — ROCURONIUM BROMIDE 100 MG/10ML IV SOLN
INTRAVENOUS | Status: DC | PRN
  Administered 2019-04-13: 10 mg via INTRAVENOUS
  Administered 2019-04-13: 20 mg via INTRAVENOUS
  Administered 2019-04-13: 40 mg via INTRAVENOUS
  Administered 2019-04-13: 10 mg via INTRAVENOUS

## 2019-04-13 MED ORDER — MIDAZOLAM HCL 2 MG/2ML IJ SOLN
INTRAMUSCULAR | Status: DC | PRN
  Administered 2019-04-13: 2 mg via INTRAVENOUS

## 2019-04-13 MED ORDER — FAMOTIDINE 20 MG PO TABS
ORAL_TABLET | ORAL | Status: AC
  Administered 2019-04-13: 20 mg via ORAL
  Filled 2019-04-13: qty 1

## 2019-04-13 MED ORDER — FENTANYL CITRATE (PF) 100 MCG/2ML IJ SOLN
INTRAMUSCULAR | Status: AC
  Filled 2019-04-13: qty 2

## 2019-04-13 MED ORDER — IBUPROFEN 800 MG PO TABS: 800.0000 mg | ORAL_TABLET | Freq: Three times a day (TID) | ORAL | 0 refills | Status: DC | PRN

## 2019-04-13 MED ORDER — CEFAZOLIN SODIUM-DEXTROSE 2-4 GM/100ML-% IV SOLN
INTRAVENOUS | Status: AC
  Filled 2019-04-13: qty 100

## 2019-04-13 MED ORDER — LACTATED RINGERS IV SOLN
INTRAVENOUS | Status: DC
  Administered 2019-04-13: 08:00:00 via INTRAVENOUS

## 2019-04-13 MED ORDER — LORAZEPAM 2 MG/ML IJ SOLN: 1.0000 mg | INTRAMUSCULAR | Status: DC | PRN

## 2019-04-13 MED ORDER — BUPIVACAINE HCL (PF) 0.25 % IJ SOLN
INTRAMUSCULAR | Status: AC
  Filled 2019-04-13: qty 30

## 2019-04-13 MED ORDER — HYDROCODONE-ACETAMINOPHEN 5-325 MG PO TABS: 1.0000 | ORAL_TABLET | Freq: Four times a day (QID) | ORAL | 0 refills | Status: DC | PRN

## 2019-04-13 MED ORDER — VITAMIN B-1 100 MG PO TABS
100.0000 mg | ORAL_TABLET | Freq: Every day | ORAL | Status: DC
  Administered 2019-04-13 – 2019-04-14 (×2): 100 mg via ORAL
  Filled 2019-04-13 (×2): qty 1

## 2019-04-13 MED ORDER — OXYCODONE HCL 5 MG PO TABS
5.0000 mg | ORAL_TABLET | ORAL | Status: DC | PRN
  Administered 2019-04-13 – 2019-04-14 (×4): 10 mg via ORAL
  Filled 2019-04-13 (×4): qty 2

## 2019-04-13 MED ORDER — CELECOXIB 200 MG PO CAPS
200.0000 mg | ORAL_CAPSULE | ORAL | Status: AC
  Administered 2019-04-13: 08:00:00 200 mg via ORAL

## 2019-04-13 MED ORDER — LIDOCAINE HCL (CARDIAC) PF 100 MG/5ML IV SOSY
PREFILLED_SYRINGE | INTRAVENOUS | Status: DC | PRN
  Administered 2019-04-13: 100 mg via INTRAVENOUS

## 2019-04-13 MED ORDER — FENTANYL CITRATE (PF) 100 MCG/2ML IJ SOLN
25.0000 ug | INTRAMUSCULAR | Status: AC | PRN
  Administered 2019-04-13 (×4): 25 ug via INTRAVENOUS

## 2019-04-13 MED ORDER — DEXAMETHASONE SODIUM PHOSPHATE 10 MG/ML IJ SOLN
INTRAMUSCULAR | Status: DC | PRN
  Administered 2019-04-13: 20 mg via INTRAVENOUS

## 2019-04-13 MED ORDER — LORAZEPAM 1 MG PO TABS
1.0000 mg | ORAL_TABLET | ORAL | Status: DC | PRN
  Administered 2019-04-13: 2 mg via ORAL
  Filled 2019-04-13: qty 2

## 2019-04-13 MED ORDER — FENTANYL CITRATE (PF) 100 MCG/2ML IJ SOLN
INTRAMUSCULAR | Status: DC | PRN
  Administered 2019-04-13: 50 ug via INTRAVENOUS

## 2019-04-13 MED ORDER — KETOROLAC TROMETHAMINE 30 MG/ML IJ SOLN
INTRAMUSCULAR | Status: AC
  Filled 2019-04-13: qty 1

## 2019-04-13 MED ORDER — ALPRAZOLAM 0.5 MG PO TABS
1.0000 mg | ORAL_TABLET | Freq: Two times a day (BID) | ORAL | Status: DC | PRN
  Administered 2019-04-13 – 2019-04-14 (×3): 1 mg via ORAL
  Filled 2019-04-13 (×3): qty 2

## 2019-04-13 MED ORDER — SUGAMMADEX SODIUM 500 MG/5ML IV SOLN
INTRAVENOUS | Status: AC
  Filled 2019-04-13: qty 5

## 2019-04-13 MED ORDER — ROCURONIUM BROMIDE 50 MG/5ML IV SOLN
INTRAVENOUS | Status: AC
  Filled 2019-04-13: qty 2

## 2019-04-13 MED ORDER — ONDANSETRON HCL 4 MG/2ML IJ SOLN
INTRAMUSCULAR | Status: DC | PRN
  Administered 2019-04-13: 4 mg via INTRAVENOUS

## 2019-04-13 MED ORDER — MIDAZOLAM HCL 2 MG/2ML IJ SOLN
INTRAMUSCULAR | Status: AC
  Filled 2019-04-13: qty 2

## 2019-04-13 MED ORDER — BUPIVACAINE LIPOSOME 1.3 % IJ SUSP: 20.0000 mL | Freq: Once | INTRAMUSCULAR | Status: DC

## 2019-04-13 SURGICAL SUPPLY — 38 items
BLADE CLIPPER SURG (BLADE) ×1 IMPLANT
BLADE SURG 15 STRL LF DISP TIS (BLADE) IMPLANT
BLADE SURG 15 STRL SS (BLADE) ×1
CANISTER SUCT 1200ML W/VALVE (MISCELLANEOUS) ×2 IMPLANT
CHLORAPREP W/TINT 26 (MISCELLANEOUS) ×2 IMPLANT
COVER WAND RF STERILE (DRAPES) ×1 IMPLANT
DERMABOND ADVANCED (GAUZE/BANDAGES/DRESSINGS) ×1
DERMABOND ADVANCED .7 DNX12 (GAUZE/BANDAGES/DRESSINGS) ×1 IMPLANT
DRAIN PENROSE 5/8X18 LTX STRL (WOUND CARE) ×2 IMPLANT
DRAPE LAPAROTOMY 77X122 PED (DRAPES) ×2 IMPLANT
ELECT CAUTERY BLADE TIP 2.5 (TIP) ×2
ELECT REM PT RETURN 9FT ADLT (ELECTROSURGICAL) ×2
ELECTRODE CAUTERY BLDE TIP 2.5 (TIP) ×1 IMPLANT
ELECTRODE REM PT RTRN 9FT ADLT (ELECTROSURGICAL) ×1 IMPLANT
GLOVE BIO SURGEON STRL SZ 6.5 (GLOVE) ×5 IMPLANT
GLOVE INDICATOR 7.0 STRL GRN (GLOVE) ×6 IMPLANT
GOWN STRL REUS W/ TWL LRG LVL3 (GOWN DISPOSABLE) ×2 IMPLANT
GOWN STRL REUS W/TWL LRG LVL3 (GOWN DISPOSABLE) ×4
KIT TURNOVER KIT A (KITS) ×2 IMPLANT
LABEL OR SOLS (LABEL) ×2 IMPLANT
MESH PARIETEX PROGRIP LEFT (Mesh General) ×1 IMPLANT
NEEDLE HYPO 22GX1.5 SAFETY (NEEDLE) ×2 IMPLANT
NS IRRIG 500ML POUR BTL (IV SOLUTION) ×2 IMPLANT
PACK BASIN MINOR ARMC (MISCELLANEOUS) ×2 IMPLANT
SPONGE KITTNER 5P (MISCELLANEOUS) ×2 IMPLANT
STRIP CLOSURE SKIN 1/2X4 (GAUZE/BANDAGES/DRESSINGS) ×2 IMPLANT
SUT ETHIBOND NAB MO 7 #0 18IN (SUTURE) IMPLANT
SUT MNCRL 4-0 (SUTURE) ×1
SUT MNCRL 4-0 27XMFL (SUTURE) ×1
SUT PROLENE 2 0 SH DA (SUTURE) IMPLANT
SUT VIC AB 2-0 SH 27 (SUTURE) ×2
SUT VIC AB 2-0 SH 27XBRD (SUTURE) IMPLANT
SUT VIC AB 3-0 SH 27 (SUTURE) ×2
SUT VIC AB 3-0 SH 27X BRD (SUTURE) ×1 IMPLANT
SUTURE MNCRL 4-0 27XMF (SUTURE) ×1 IMPLANT
SYR 10ML LL (SYRINGE) ×2 IMPLANT
SYR BULB IRRIG 60ML STRL (SYRINGE) ×2 IMPLANT
TAPE STRIPS DRAPE STRL (GAUZE/BANDAGES/DRESSINGS) ×1 IMPLANT

## 2019-04-13 NOTE — OR Nursing (Signed)
Otho Ket, PA in to evaluate patient.   Advises will admit for observation with physical therapy consult.

## 2019-04-13 NOTE — Transfer of Care (Signed)
Immediate Anesthesia Transfer of Care Note  Patient: Lance House  Procedure(s) Performed: HERNIA REPAIR INGUINAL ADULT OPEN, LEFT (Left Inguinal)  Patient Location: PACU  Anesthesia Type:General  Level of Consciousness: awake, drowsy and patient cooperative  Airway & Oxygen Therapy: Patient Spontanous Breathing and Patient connected to face mask oxygen  Post-op Assessment: Report given to RN and Post -op Vital signs reviewed and stable  Post vital signs: Reviewed and stable  Last Vitals:  Vitals Value Taken Time  BP 151/99 04/13/19 1113  Temp    Pulse 94 04/13/19 1116  Resp 20 04/13/19 1116  SpO2 99 % 04/13/19 1116  Vitals shown include unvalidated device data.  Last Pain:  Vitals:   04/13/19 0747  TempSrc: Tympanic  PainSc: 8          Complications: No apparent anesthesia complications

## 2019-04-13 NOTE — OR Nursing (Signed)
Patient weak in knees with transfer from stretcher to chair and with assistance while getting dressed.  Discussed with Dr. Celine Ahr via tele approx. 1500, advises she will send Zack for patient assessment.

## 2019-04-13 NOTE — H&P (Addendum)
[facility] SURGICAL HISTORY & PHYSICAL  HISTORY OF PRESENT ILLNESS (HPI):  67 y.o. male presented to [facility] today for scheduled left inguinal hernia repair with Dr Celine Ahr. There were no immediate complications with this procedure. We were notified by Phase II RN that there was concern over patient weakness. Patient and RN reporting that his bilateral legs "give out" when attempting to ambulate even a few steps. This is new since surgery. He denied any sensation or motor strength lose. No other neurological symptoms. His left groin pain is well controlled with medications. He does again endorse a significant alcohol history and has not consumed EtOH in around 48 hours. He reports that when he goes a long time without drinking he gets tremulous, weak, and anxious. He takes Xanax at home for anxiety which helps. No other complaints at this time.   PAST MEDICAL HISTORY (PMH):  Past Medical History:  Diagnosis Date  . Anxiety   . Depression   . Gout     Reviewed. Otherwise negative.   PAST SURGICAL HISTORY ([place]):  Past Surgical History:  Procedure Laterality Date  . APPENDECTOMY  1994  . NASAL SINUS SURGERY  2015   [facility]    Reviewed. Otherwise negative.   MEDICATIONS:  Prior to Admission medications   Medication Sig Start Date End Date Taking? Authorizing Provider  ALPRAZolam Duanne Moron) 0.5 MG tablet Take 1 mg by mouth 2 (two) times daily as needed for anxiety.    Yes [provider]  HYDROcodone-acetaminophen (NORCO/VICODIN) 5-325 MG tablet Take 1 tablet by mouth every 6 (six) hours as needed for moderate pain or severe pain. 04/08/19 04/07/20 Yes Tylene Fantasia, PA-C  allopurinol (ZYLOPRIM) 100 MG tablet Take 1 tablet (100 mg total) by mouth daily. Take 100mg  by mouth daily for 1 week.  Then increase to 200mg  by mouth daily for 1 more week.  Then increase to 300mg  by mouth daily thereafter. Patient not taking: Reported on 04/10/2019 04/07/15   Carrie Mew, MD   HYDROcodone-acetaminophen (NORCO/VICODIN) 5-325 MG tablet Take 1 tablet by mouth every 6 (six) hours as needed for moderate pain. 04/13/19   Fredirick Maudlin, MD  ibuprofen (ADVIL) 800 MG tablet Take 1 tablet (800 mg total) by mouth every 8 (eight) hours as needed. 04/13/19   Fredirick Maudlin, MD     ALLERGIES:  Allergies  Allergen Reactions  . Ciprocin-Fluocin-Procin [Fluocinolone] Anaphylaxis    Hip hurt  . Duloxetine Diarrhea, Nausea And Vomiting and Other (See Comments)    Altered mental status     SOCIAL HISTORY:  Social History   Socioeconomic History  . Marital status: Married    Spouse name: Not on file  . Number of children: Not on file  . Years of education: Not on file  . Highest education level: Not on file  Occupational History  . Not on file  Social Needs  . Financial resource strain: Not on file  . Food insecurity    Worry: Not on file    Inability: Not on file  . Transportation needs    Medical: Not on file    Non-medical: Not on file  Tobacco Use  . Smoking status: Current Every Day Smoker    Packs/day: 2.00  . Smokeless tobacco: Never Used  Substance and Sexual Activity  . Alcohol use: Yes    Alcohol/week: 42.0 standard drinks    Types: 42 Cans of beer per week  . Drug use: Not Currently  . Sexual activity: Not on file  Lifestyle  . Physical activity    Days per week: Not on file    Minutes per session: Not on file  . Stress: Not on file  Relationships  . Social Herbalist on phone: Not on file    Gets together: Not on file    Attends religious service: Not on file    Active member of club or organization: Not on file    Attends meetings of clubs or organizations: Not on file    Relationship status: Not on file  . Intimate partner violence    Fear of current or ex partner: Not on file    Emotionally abused: Not on file    Physically abused: Not on file    Forced sexual activity: Not on file  Other Topics Concern  . Not on file   Social History Narrative  . Not on file     FAMILY HISTORY:  Family History  Problem Relation Age of Onset  . Skin cancer Father   . Multiple sclerosis Sister   . Non-Hodgkin's lymphoma Brother     Otherwise negative.   REVIEW OF SYSTEMS:  Review of Systems  Constitutional: Negative for chills and fever.  Respiratory: Negative for cough and shortness of breath.   Cardiovascular: Negative for chest pain and palpitations.  Gastrointestinal: Positive for abdominal pain. Negative for nausea and vomiting.  Genitourinary: Negative for dysuria and urgency.  Neurological: Positive for weakness. Negative for dizziness, tingling, tremors, speech change, focal weakness and headaches.  All other systems reviewed and are negative.   VITAL SIGNS:  Temp:  [97.2 F (36.2 C)-98.8 F (37.1 C)] 97.6 F (36.4 C) (09/21 1333) Pulse Rate:  [69-103] 103 (09/21 1546) Resp:  [5-19] 16 (09/21 1546) BP: (141-179)/(86-103) 169/94 (09/21 1546) SpO2:  [84 %-100 %] 97 % (09/21 1546)             PHYSICAL EXAM:  Physical Exam Vitals signs and nursing note reviewed.  Constitutional:      General: He is not in acute distress.    Appearance: Normal appearance. He is not ill-appearing.  HENT:     Head: Normocephalic and atraumatic.  Eyes:     General: No scleral icterus.    Conjunctiva/sclera: Conjunctivae normal.  Cardiovascular:     Rate and Rhythm: Regular rhythm. Tachycardia present.     Pulses: Normal pulses.     Heart sounds: No murmur. No friction rub. No gallop.   Pulmonary:     Effort: Pulmonary effort is normal. No respiratory distress.     Breath sounds: Normal breath sounds. No wheezing.  Abdominal:     General: Abdomen is flat. A surgical scar is present. There is no distension.     Palpations: Abdomen is soft.     Tenderness: There is no abdominal tenderness.    Genitourinary:    Comments: Deferred Musculoskeletal:        General: No signs of injury.     Right lower leg: No  edema.     Left lower leg: No edema.     Comments: Patient ambulates with shuffling steps and bilateral lower extremities "give out." No focal weakness or sensation lose.   Neurological:     General: No focal deficit present.     Mental Status: He is alert and oriented to person, place, and time.  Psychiatric:        Mood and Affect: Mood normal.        Behavior: Behavior normal.  INTAKE/OUTPUT:  This shift: Total I/O In: [id number] [P.O.:120; I.V.:1450] Out: [id number] [Urine:1350; Blood:25]  Last 2 shifts: @IOLAST2SHIFTS @  Labs:  CBC Latest Ref Rng & Units 04/07/2019 04/07/2015  WBC 4.0 - 10.5 K/uL 6.1 11.4(H)  Hemoglobin 13.0 - 17.0 g/dL 16.9 14.5  Hematocrit 39.0 - 52.0 % 49.6 42.2  Platelets 150 - 400 K/uL 199 238   CMP Latest Ref Rng & Units 04/07/2019 04/07/2015  Glucose 70 - 99 mg/dL 89 114(H)  BUN 8 - 23 mg/dL 7(L) 12  Creatinine 0.61 - 1.24 mg/dL 0.82 0.82  Sodium 135 - 145 mmol/L 138 135  Potassium 3.5 - 5.1 mmol/L 4.1 4.3  Chloride 98 - 111 mmol/L 102 98(L)  CO2 22 - 32 mmol/L 25 28  Calcium 8.9 - 10.3 mg/dL 9.0 9.5  Total Protein 6.5 - 8.1 g/dL 7.9 8.2(H)  Total Bilirubin 0.3 - 1.2 mg/dL 1.2 1.9(H)  Alkaline Phos 38 - 126 U/L 64 62  AST 15 - 41 U/L 33 19  ALT 0 - 44 U/L 25 12(L)     Imaging studies:  No pertinent imaging studies    Assessment/Plan:  68 y.o. male with bilateral lower extremity weakness s/p left inguinal hernia repair today with Dr Celine Ahr, which is complicated by a history of alcohol use.    - Discussed with the patient and his wife my thought process regarding the etiology of his symptoms. At this time, I feel this may be more closely related to alcohol withdrawal given his tachycardia, anxiety, and weakness. At this time, I offered admission for observation and PT evaluation prior to discharge, which he and his wife were agreeable with.   - Place in observation  - regular diet  - IVF  - pain control prn; antiemetics prn  - monitor abdominal  examination  - CIWA protocol  - We will engage PT  - medical management of comorbidities; home medications   All of the above findings and recommendations were discussed with the patient and his family, and all of their questions were answered to their expressed satisfaction.  -- Edison Simon, PA-C [facility] 04/13/2019, 4:19 PM [phone] M-F: 7am - 4pm  I saw and evaluated the patient.  I agree with the above documentation, exam, and plan, which I have edited where appropriate. Fredirick Maudlin  8:59 AM

## 2019-04-13 NOTE — Anesthesia Procedure Notes (Signed)
Procedure Name: Intubation Performed by: Kelton Pillar, CRNA Pre-anesthesia Checklist: Patient identified, Emergency Drugs available, Suction available and Patient being monitored Patient Re-evaluated:Patient Re-evaluated prior to induction Oxygen Delivery Method: Circle system utilized Preoxygenation: Pre-oxygenation with 100% oxygen Induction Type: IV induction Ventilation: Mask ventilation without difficulty Laryngoscope Size: McGraph and 3 Grade View: Grade I Tube type: Oral Tube size: 7.0 mm Number of attempts: 1 Airway Equipment and Method: Stylet Placement Confirmation: positive ETCO2,  ETT inserted through vocal cords under direct vision,  breath sounds checked- equal and bilateral and CO2 detector Secured at: 21 cm Tube secured with: Tape Dental Injury: Teeth and Oropharynx as per pre-operative assessment  Difficulty Due To: Difficult Airway-  due to edematous airway Future Recommendations: Recommend- induction with short-acting agent, and alternative techniques readily available Comments: VL (McGraph) on 1st attempt atraumtatic  however pt noted w/swollen aiway which could prove to be a difficult airway in the future TF[facility]

## 2019-04-13 NOTE — Discharge Instructions (Signed)
AMBULATORY SURGERY  °DISCHARGE INSTRUCTIONS ° ° °1) The drugs that you were given will stay in your system until tomorrow so for the next 24 hours you should not: ° °A) Drive an automobile °B) Make any legal decisions °C) Drink any alcoholic beverage ° ° °2) You may resume regular meals tomorrow.  Today it is better to start with liquids and gradually work up to solid foods. ° °You may eat anything you prefer, but it is better to start with liquids, then soup and crackers, and gradually work up to solid foods. ° ° °3) Please notify your doctor immediately if you have any unusual bleeding, trouble breathing, redness and pain at the surgery site, drainage, fever, or pain not relieved by medication. ° ° ° °4) Additional Instructions: ° ° ° ° ° ° ° °Please contact your physician with any problems or Same Day Surgery at [phone], Monday through Friday 6 am to 4 pm, or [facility] [place] Main number at [phone]. °

## 2019-04-13 NOTE — Anesthesia Preprocedure Evaluation (Signed)
Anesthesia Evaluation  Patient identified by MRN, date of birth, ID band Patient awake    Reviewed: Allergy & Precautions, NPO status , Patient's Chart, lab work & pertinent test results  History of Anesthesia Complications Negative for: history of anesthetic complications  Airway Mallampati: II  TM Distance: >3 FB Neck ROM: Full    Dental  (+) Poor Dentition, Chipped   Pulmonary neg sleep apnea, neg COPD, Current Smoker and Patient abstained from smoking.,    breath sounds clear to auscultation- rhonchi (-) wheezing      Cardiovascular Exercise Tolerance: Good (-) hypertension(-) CAD, (-) Past MI, (-) Cardiac Stents and (-) CABG  Rhythm:Regular Rate:Normal - Systolic murmurs and - Diastolic murmurs    Neuro/Psych neg Seizures PSYCHIATRIC DISORDERS Anxiety Depression negative neurological ROS     GI/Hepatic negative GI ROS, Neg liver ROS,   Endo/Other  negative endocrine ROSneg diabetes  Renal/GU negative Renal ROS     Musculoskeletal negative musculoskeletal ROS (+)   Abdominal (+) - obese,   Peds  Hematology negative hematology ROS (+)   Anesthesia Other Findings Past Medical History: No date: Anxiety No date: Depression No date: Gout   Reproductive/Obstetrics                             Anesthesia Physical Anesthesia Plan  ASA: II  Anesthesia Plan: General   Post-op Pain Management:    Induction: Intravenous  PONV Risk Score and Plan: 0 and Ondansetron  Airway Management Planned: Oral ETT  Additional Equipment:   Intra-op Plan:   Post-operative Plan: Extubation in OR  Informed Consent: I have reviewed the patients History and Physical, chart, labs and discussed the procedure including the risks, benefits and alternatives for the proposed anesthesia with the patient or authorized representative who has indicated his/her understanding and acceptance.     Dental  advisory given  Plan Discussed with: CRNA and Anesthesiologist  Anesthesia Plan Comments:         Anesthesia Quick Evaluation

## 2019-04-13 NOTE — Op Note (Signed)
Hernia, Open, Procedure Note  Indications: The patient presented with a history of a left, not reducible inguinal hernia.    Pre-operative Diagnosis: left not reducible inguinal hernia  Post-operative Diagnosis: same  Surgeon: Fredirick Maudlin   Assistants: Ardath Sax, MD (a second surgeon was necessary due to the difficulty of the case and degree of incarceration)  Anesthesia: General endotracheal anesthesia  ASA Class: 2  Procedure Details  The patient was seen again in the Holding Room. The risks, benefits, complications, treatment options, and expected outcomes were discussed with the patient. The possibilities of reaction to medication, pulmonary aspiration, perforation of viscus, bleeding, recurrent infection, the need for additional procedures, and development of a complication requiring transfusion or further operation were discussed with the patient and/or family. There was concurrence with the proposed plan, and informed consent was obtained. The site of surgery was properly noted/marked. The patient was taken to the Operating Room, identified as Lance House, and the procedure verified as hernia repair. A Time Out was held and the above information confirmed.  The patient was placed in the supine position and underwent induction of anesthesia, the lower abdomen and groin was prepped and draped in the standard fashion, and a one-to-one mixture of 0.25% bupivacaine and 1% lidocaine with epinephrine was used to anesthetize the skin over the mid-portion of the inguinal canal. A transverse incision was made. Dissection was carried through the soft tissue to expose the inguinal canal and inguinal ligament along its lower edge. The external oblique fascia was split along the course of its fibers, exposing the inguinal canal.  I spent approximately 30 minutes attempting to reduce the hernia in order to get the cord structures encircled.  At that point, Dr. Hampton Abbot came into the case and  assisted me with the reduction as well as the remainder of the procedure.  The cord and nerve were looped using a Penrose drain and reflected out of the field.  The hernia sac was dissected away from the cord structures.  Once it had been dissected back to the internal ring, it was suture-ligated and the sac resected.  A cord lipoma was also dissected off the structures and resected.  The defect was exposed and a piece of Progrip mesh was placed, taking care to provide adequate medial coverage at the pubic tubercle.  It was laid down smoothly along the shelving edge.  We had to increase the size of the precut split and hole to better accommodate the cord structures.  Once we had done this it was neatly wrapped around the cord, ensuring adequate passage of the tips of a pair of forceps.  The medial edge tucked neatly under the conjoined tendon and there was adequate coverage of the defect. The contents were then returned to canal and the external oblique fashion was then closed in a continuous fashion using 2-0 Vicryl suture taking care not to cause entrapment.  Liposomal bupivacaine was injected circumferentially around the wound.  3-0 Vicryl was used to close the deep dermal layer, and the skin was closed with running subcuticular Monocryl.  The skin was cleaned and Dermabond and Steri-Strips were applied.  The patient was then awakened, extubated, and taken to the postanesthesia care unit in good condition.  Instrument, sponge, and needle counts were correct prior to closure and at the conclusion of the case.  Findings: Hernia as above  Estimated Blood Loss: Minimal         Drains: None  Total IV Fluids: See anesthesia record         Specimens: None               Complications: None; patient tolerated the procedure well.         Disposition: PACU - hemodynamically stable.         Condition: stable

## 2019-04-13 NOTE — Anesthesia Post-op Follow-up Note (Signed)
Anesthesia QCDR form completed.        

## 2019-04-13 NOTE — Interval H&P Note (Signed)
History and Physical Interval Note:  04/13/2019 8:47 AM  Lance House  has presented today for surgery, with the diagnosis of LEFT INGUINAL HERNIA.  The various methods of treatment have been discussed with the patient and family. After consideration of risks, benefits and other options for treatment, the patient has consented to  Procedure(s): HERNIA REPAIR INGUINAL ADULT OPEN, LEFT (Left) as a surgical intervention.  The patient's history has been reviewed, patient examined, no change in status, stable for surgery.  I have reviewed the patient's chart and labs.  Questions were answered to the patient's satisfaction.     Fredirick Maudlin

## 2019-04-14 ENCOUNTER — Encounter: Payer: Self-pay | Admitting: General Surgery

## 2019-04-14 DIAGNOSIS — K4030 Unilateral inguinal hernia, with obstruction, without gangrene, not specified as recurrent: Secondary | ICD-10-CM | POA: Diagnosis not present

## 2019-04-14 LAB — BASIC METABOLIC PANEL
Anion gap: 6 (ref 5–15)
BUN: 8 mg/dL (ref 8–23)
CO2: 26 mmol/L (ref 22–32)
Calcium: 8.3 mg/dL — ABNORMAL LOW (ref 8.9–10.3)
Chloride: 103 mmol/L (ref 98–111)
Creatinine, Ser: 0.67 mg/dL (ref 0.61–1.24)
GFR calc Af Amer: >60 mL/min (ref >60)
GFR calc non Af Amer: >60 mL/min (ref >60)
Glucose, Bld: 125 mg/dL — ABNORMAL HIGH (ref 70–99)
Potassium: 3.9 mmol/L (ref 3.5–5.1)
Sodium: 135 mmol/L (ref 135–145)

## 2019-04-14 LAB — CBC
HCT: 40.7 % (ref 39.0–52.0)
Hemoglobin: 13.5 g/dL (ref 13.0–17.0)
MCH: 33.5 pg (ref 26.0–34.0)
MCHC: 33.2 g/dL (ref 30.0–36.0)
MCV: 101 fL — ABNORMAL HIGH (ref 80.0–100.0)
Platelets: 174 10*3/uL (ref 150–400)
RBC: 4.03 MIL/uL — ABNORMAL LOW (ref 4.22–5.81)
RDW: 12.9 % (ref 11.5–15.5)
WBC: 10.9 10*3/uL — ABNORMAL HIGH (ref 4.0–10.5)
nRBC: 0 % (ref 0.0–0.2)

## 2019-04-14 LAB — MAGNESIUM: Magnesium: 2.3 mg/dL (ref 1.7–2.4)

## 2019-04-14 LAB — PHOSPHORUS: Phosphorus: 4.1 mg/dL (ref 2.5–4.6)

## 2019-04-14 MED ORDER — OXYCODONE HCL 5 MG PO TABS
10.0000 mg | ORAL_TABLET | Freq: Once | ORAL | Status: AC
  Administered 2019-04-14: 10 mg via ORAL
  Filled 2019-04-14: qty 2

## 2019-04-14 NOTE — Progress Notes (Signed)
[name] to be D/C'd home per MD order.  Discussed prescriptions and follow up appointments with the patient. Prescriptions given to patient, medication list explained in detail. Pt verbalized understanding.    Vitals:   04/13/19 2214 04/14/19 0428  BP: (!) 128/92 (!) 139/96  Pulse: (!) 109 85  Resp:  16  Temp:  98.1 F (36.7 C)  SpO2: 93% 95%    Skin clean, dry and intact without evidence of skin break down, no evidence of skin tears noted. IV catheter discontinued intact. Site without signs and symptoms of complications. Dressing and pressure applied. Pt denies pain at this time. No complaints noted.  An After Visit Summary was printed and given to the patient. Patient escorted via [place], and D/C home via private auto. Patient requesting one time dose of pain medication prior to d since his wife will be going to work and he cannot get to the pharmacy for his pain medications until this evening. MD gave verbal for one time dose.  Lance House

## 2019-04-14 NOTE — Discharge Summary (Addendum)
[facility] SURGICAL DISCHARGE SUMMARY   Patient ID: Lance House MRN: [id number] DOB/AGE: 12/30/1950 68 y.o.  Admit date: 04/13/2019 Discharge date: 04/14/2019  Discharge Diagnoses Patient Active Problem List   Diagnosis Date Noted  . S/P inguinal hernia repair 04/13/2019  . Incarcerated left inguinal hernia   . Abdominal pain 04/08/2019    Consultants None  Procedures 04/13/2019:  Left Inguinal Hernia Repair  HPI: Harrold Zinger is a 68 y.o. male with a history of left inguinal hernia who presents to [facility] on 09/21 for left inguinal hernia repair with Dr Celine Ahr.   Hospital Course: Informed consent was obtained and documented, and patient underwent uneventful left inguinal hernia repair (Dr Celine Ahr, 04/13/2019).  Post-operatively, the patient had issues with weakness in his bilateral lower extremities and they were "giving out" in phase II. Due to this he was admitted to observation with PT consultation. On POD1, he reports he was feeling much better and his weakness had improved. He was able to ambulate safely without weakness or "giving out." Advancement of patient's diet and ambulation were well-tolerated. The remainder of patient's hospital course was essentially unremarkable, and discharge planning was initiated accordingly with patient safely able to be discharged home with appropriate discharge instructions, pain control, and outpatient follow-up after all of his questions were answered to his expressed satisfaction.  Discharge Condition: Good  Physical Examination:  Constitutional: Well appearing male, NAD Pulmonary: Normal effort, no respiratory distress Gastrointestinal: Soft, non-tender, non-distended Skin: Left inguinal incision is intact with steri-strips, there is surrounding ecchymosis, no erythema or drainage, this is edematous.    Allergies as of 04/14/2019      Reactions   Ciprocin-fluocin-procin [fluocinolone] Anaphylaxis   Hip hurt   Duloxetine Diarrhea, Nausea And Vomiting, Other (See Comments)   Altered mental status      Medication List    STOP taking these medications   allopurinol 100 MG tablet Commonly known as: Zyloprim     TAKE these medications   ALPRAZolam 0.5 MG tablet Commonly known as: XANAX Take 1 mg by mouth 2 (two) times daily as needed for anxiety.   HYDROcodone-acetaminophen 5-325 MG tablet Commonly known as: NORCO/VICODIN Take 1 tablet by mouth every 6 (six) hours as needed for moderate pain. What changed: reasons to take this   ibuprofen 800 MG tablet Commonly known as: ADVIL Take 1 tablet (800 mg total) by mouth every 8 (eight) hours as needed.        Follow-up Information    Fredirick Maudlin, MD. Schedule an appointment as soon as possible for a visit on 04/28/2019.   Specialty: General Surgery Why: at 2:15pm for post operative visit Contact information: [place] [address] [phone]        [facility].   Why: New patient appointment - needs to establish care Contact information: [place] [address] [phone]            Time spent on discharge management including discussion of hospital course, clinical condition, outpatient instructions, prescriptions, and follow up with the patient and members of the medical team: >30 minutes  -- Edison Simon , PA-C [facility]  04/14/2019, 11:57 AM [phone] M-F: 7am - 4pm   I saw and evaluated the patient.  I agree with the above documentation, exam, and plan, which I have edited where appropriate. Fredirick Maudlin  12:04 PM

## 2019-04-14 NOTE — Anesthesia Postprocedure Evaluation (Signed)
Anesthesia Post Note  Patient: Lance House  Procedure(s) Performed: HERNIA REPAIR INGUINAL ADULT OPEN, LEFT (Left Inguinal)  Patient location during evaluation: PACU Anesthesia Type: General Level of consciousness: awake and alert and oriented Pain management: pain level controlled Vital Signs Assessment: post-procedure vital signs reviewed and stable Respiratory status: spontaneous breathing, nonlabored ventilation and respiratory function stable Cardiovascular status: blood pressure returned to baseline and stable Postop Assessment: no signs of nausea or vomiting Anesthetic complications: no     Last Vitals:  Vitals:   04/13/19 2214 04/14/19 0428  BP: (!) 128/92 (!) 139/96  Pulse: (!) 109 85  Resp:  16  Temp:  36.7 C  SpO2: 93% 95%    Last Pain:  Vitals:   04/14/19 0428  TempSrc: Oral  PainSc:                  Kelda Azad

## 2019-04-15 ENCOUNTER — Telehealth: Payer: Self-pay | Admitting: *Deleted

## 2019-04-15 LAB — HIV ANTIBODY (ROUTINE TESTING W REFLEX): HIV Screen 4th Generation wRfx: NONREACTIVE

## 2019-04-15 NOTE — Telephone Encounter (Signed)
Wife called in and patient is having swelling in his scrotum area  .Advised  to use a ice pack to the area off and on . May roll a towel up and places until his scrotum area, per piscoya

## 2019-04-27 ENCOUNTER — Telehealth: Payer: Self-pay | Admitting: *Deleted

## 2019-04-27 NOTE — Telephone Encounter (Signed)
Patients wife called said she will be out of her office until 2 and just have one of the nurses call after 2.

## 2019-04-27 NOTE — Telephone Encounter (Signed)
Telephone Triage Questions    Type of surgery: Left inguinal hernia repair     Date: 04/13/19                                  Physician: Everardo Beals      Pain ? Where and how severe, (1-10) No   Nausea, vomiting, Fever, chills? No   Any drainage? (Color) Just Bleeding  Does it feel hot to the touch? No   Any Bright redness around the wound/ area? No  Patients wife called and stated that the patients incision is bleeding when she goes to clean it. She also stated that the patient mashed down a little bit on the incision and lots of blood came out, about the amount size of a baseball. Please call and advise. Please call patients wife at [phone]

## 2019-04-27 NOTE — Telephone Encounter (Signed)
Spoke with wife and she had questions regarding the surgical tape that was placed over the wound. She was instructed to not remove the steristrips.  She denies any redness or pus drainage, however she did notice a very small amount of blood on the bandage.   The husband called as well and wanted something for pain, I let him know that he could try Tylenol and ibuprofen together and if this does not help call the office Tuesday.

## 2019-04-27 NOTE — Telephone Encounter (Signed)
Patients wife called again and is wanting a nurse to call her back, she is getting frustrated because she has call 3 times so far. Please call and advise

## 2019-04-28 ENCOUNTER — Encounter: Payer: Medicare HMO | Admitting: General Surgery

## 2019-04-30 ENCOUNTER — Ambulatory Visit (INDEPENDENT_AMBULATORY_CARE_PROVIDER_SITE_OTHER): Payer: Medicare HMO | Admitting: General Surgery

## 2019-04-30 ENCOUNTER — Other Ambulatory Visit: Payer: Self-pay

## 2019-04-30 ENCOUNTER — Encounter: Payer: Self-pay | Admitting: General Surgery

## 2019-04-30 VITALS — BP 126/69 | HR 96 | Temp 97.3°F | Resp 12 | Ht 73.0 in | Wt 188.0 lb

## 2019-04-30 DIAGNOSIS — Z8719 Personal history of other diseases of the digestive system: Secondary | ICD-10-CM

## 2019-04-30 DIAGNOSIS — Z9889 Other specified postprocedural states: Secondary | ICD-10-CM

## 2019-04-30 NOTE — Patient Instructions (Addendum)
  You may apply ice pack or heat to help with the swelling.  Please see your appointment listed below.      GENERAL POST-OPERATIVE PATIENT INSTRUCTIONS   WOUND CARE INSTRUCTIONS:  Keep a dry clean dressing on the wound if there is drainage. The initial bandage may be removed after 24 hours.  Once the wound has quit draining you may leave it open to air.  If clothing rubs against the wound or causes irritation and the wound is not draining you may cover it with a dry dressing during the daytime.  Try to keep the wound dry and avoid ointments on the wound unless directed to do so.  If the wound becomes bright red and painful or starts to drain infected material that is not clear, please contact your physician immediately.  If the wound is mildly pink and has a thick firm ridge underneath it, this is normal, and is referred to as a healing ridge.  This will resolve over the next 4-6 weeks.  BATHING: You may shower if you have been informed of this by your surgeon. However, Please do not submerge in a tub, hot tub, or pool until incisions are completely sealed or have been told by your surgeon that you may do so.  DIET:  You may eat any foods that you can tolerate.  It is a good idea to eat a high fiber diet and take in plenty of fluids to prevent constipation.  If you do become constipated you may want to take a mild laxative or take ducolax tablets on a daily basis until your bowel habits are regular.  Constipation can be very uncomfortable, along with straining, after recent surgery.  ACTIVITY:  You are encouraged to cough and deep breath or use your incentive spirometer if you were given one, every 15-30 minutes when awake.  This will help prevent respiratory complications and low grade fevers post-operatively if you had a general anesthetic.  You may want to hug a pillow when coughing and sneezing to add additional support to the surgical area, if you had abdominal or chest surgery, which will  decrease pain during these times.  You are encouraged to walk and engage in light activity for the next two weeks.  You should not lift more than 20 pounds, until 05/25/2019 as it could put you at increased risk for complications.  Twenty pounds is roughly equivalent to a plastic bag of groceries. At that time- Listen to your body when lifting, if you have pain when lifting, stop and then try again in a few days. Soreness after doing exercises or activities of daily living is normal as you get back in to your normal routine.  MEDICATIONS:  Try to take narcotic medications and anti-inflammatory medications, such as tylenol, ibuprofen, naprosyn, etc., with food.  This will minimize stomach upset from the medication.  Should you develop nausea and vomiting from the pain medication, or develop a rash, please discontinue the medication and contact your physician.  You should not drive, make important decisions, or operate machinery when taking narcotic pain medication.  SUNBLOCK Use sun block to incision area over the next year if this area will be exposed to sun. This helps decrease scarring and will allow you avoid a permanent darkened area over your incision.  QUESTIONS:  Please feel free to call our office if you have any questions, and we will be glad to assist you. [phone]

## 2019-04-30 NOTE — Progress Notes (Signed)
Lance House is here today for a postoperative visit.  He underwent an open left inguinal hernia repair on 13 April 2019.  The hernia was incarcerated and exceptionally difficult to reduce.  Since his surgery he has contacted our office a couple of times regarding swelling in the scrotum and at the surgical site.  He has also experienced a small amount of drainage from the midportion of the incision.  He states that the drainage has been old dark blood.  He has not noticed any pus draining from the site.  He has not experienced any fevers or chills.  Today's Vitals   04/30/19 1040  BP: 126/69  Pulse: 96  Resp: 12  Temp: (!) 97.3 F (36.3 C)  TempSrc: Temporal  SpO2: 96%  Weight: 188 lb (85.3 kg)  Height: 6\' 1"  (1.854 m)  PainSc: 0-No pain   Body mass index is 24.8 kg/m.  Physical Exam  Abdominal:     Impression and plan: Lance House is a 68 year old man who underwent a very challenging open hernia repair.  He had a fair amount of scrotal swelling that has improved and almost fully resolved at this time.  I think he likely has a hematoma, secondary to the extensive dissection and manipulation required to reduce his hernia.  I have recommended that he continue to alternate heat and ice to the area.  We will have him return for a recheck in 2 to 3 weeks.  If there is no improvement or the area is worse, we will obtain an ultrasound to determine whether or not there is any drainable fluid collection present or other findings that may need surgical evaluation.

## 2019-05-08 DIAGNOSIS — R1032 Left lower quadrant pain: Secondary | ICD-10-CM | POA: Diagnosis not present

## 2019-05-08 DIAGNOSIS — Z13220 Encounter for screening for lipoid disorders: Secondary | ICD-10-CM | POA: Diagnosis not present

## 2019-05-08 DIAGNOSIS — F419 Anxiety disorder, unspecified: Secondary | ICD-10-CM | POA: Insufficient documentation

## 2019-05-08 DIAGNOSIS — R5383 Other fatigue: Secondary | ICD-10-CM | POA: Diagnosis not present

## 2019-05-08 DIAGNOSIS — J449 Chronic obstructive pulmonary disease, unspecified: Secondary | ICD-10-CM | POA: Diagnosis not present

## 2019-05-08 DIAGNOSIS — F458 Other somatoform disorders: Secondary | ICD-10-CM | POA: Insufficient documentation

## 2019-05-08 DIAGNOSIS — Z131 Encounter for screening for diabetes mellitus: Secondary | ICD-10-CM | POA: Diagnosis not present

## 2019-05-08 DIAGNOSIS — R69 Illness, unspecified: Secondary | ICD-10-CM | POA: Diagnosis not present

## 2019-05-08 DIAGNOSIS — R109 Unspecified abdominal pain: Secondary | ICD-10-CM | POA: Diagnosis not present

## 2019-05-08 DIAGNOSIS — M1A9XX0 Chronic gout, unspecified, without tophus (tophi): Secondary | ICD-10-CM | POA: Diagnosis not present

## 2019-05-08 DIAGNOSIS — E785 Hyperlipidemia, unspecified: Secondary | ICD-10-CM | POA: Diagnosis not present

## 2019-05-08 DIAGNOSIS — M109 Gout, unspecified: Secondary | ICD-10-CM | POA: Diagnosis not present

## 2019-05-08 DIAGNOSIS — M1A49X0 Other secondary chronic gout, multiple sites, without tophus (tophi): Secondary | ICD-10-CM | POA: Diagnosis not present

## 2019-05-08 HISTORY — DX: Anxiety disorder, unspecified: F41.9

## 2019-05-14 ENCOUNTER — Encounter: Payer: Self-pay | Admitting: General Surgery

## 2019-05-14 ENCOUNTER — Ambulatory Visit (INDEPENDENT_AMBULATORY_CARE_PROVIDER_SITE_OTHER): Payer: Self-pay | Admitting: General Surgery

## 2019-05-14 ENCOUNTER — Other Ambulatory Visit: Payer: Self-pay

## 2019-05-14 VITALS — BP 138/84 | HR 96 | Temp 97.5°F | Resp 16 | Ht 73.0 in | Wt 189.0 lb

## 2019-05-14 DIAGNOSIS — Z8719 Personal history of other diseases of the digestive system: Secondary | ICD-10-CM

## 2019-05-14 DIAGNOSIS — Z9889 Other specified postprocedural states: Secondary | ICD-10-CM

## 2019-05-14 NOTE — Patient Instructions (Addendum)
Apply heat to the area. May take a few months to resolve. Please call our office if you have questions or concerns.

## 2019-05-14 NOTE — Progress Notes (Signed)
Lance House is here today for a postoperative visit after undergoing an open left inguinal hernia repair for an incarcerated hernia.  The operation was quite difficult due to the degree of incarceration and significant manipulation was required to reduce the hernia intraoperatively.  He came to clinic on October 8, at which time he had a large hematoma that involves the scrotum and inguinal canal.  He was advised to manage this conservatively with alternating hot and cold packs and nonsteroidal anti-inflammatory medications.  He is here today for recheck.  Lance House states that he thinks he is about 30% better than the last time he was seen.  His pain is improved.  And he feels like the swelling has decreased.  Today's Vitals   05/14/19 0902  BP: 138/84  Pulse: 96  Resp: 16  Temp: (!) 97.5 F (36.4 C)  TempSrc: Temporal  SpO2: 94%  Weight: 189 lb (85.7 kg)  Height: 6\' 1"  (1.854 m)  PainSc: 0-No pain   Body mass index is 24.94 kg/m. Focused exam: The hernia incision site is healing nicely.  There is no surrounding erythema induration or drainage.  The hematoma in his groin has reduced in size substantially.  It is not tender.  His scrotum is of normal color and size at this time.  Impression and plan: Lance House is a 68 year old man who had a severely incarcerated left inguinal hernia.  He is now status post repair.  There is no evidence of recurrence however he does still have residual hematoma.  It was recommended that he continue using moist heat to the area and continue to gradually increase his activity.  He was counseled that it could take several months for the hematoma to completely resorb.  I will see him on an as-needed basis.

## 2019-07-08 DIAGNOSIS — R1032 Left lower quadrant pain: Secondary | ICD-10-CM | POA: Diagnosis not present

## 2019-07-08 DIAGNOSIS — J449 Chronic obstructive pulmonary disease, unspecified: Secondary | ICD-10-CM | POA: Diagnosis not present

## 2019-07-08 DIAGNOSIS — R0600 Dyspnea, unspecified: Secondary | ICD-10-CM | POA: Diagnosis not present

## 2019-07-08 DIAGNOSIS — R69 Illness, unspecified: Secondary | ICD-10-CM | POA: Diagnosis not present

## 2019-07-08 DIAGNOSIS — F419 Anxiety disorder, unspecified: Secondary | ICD-10-CM | POA: Diagnosis not present

## 2019-07-08 DIAGNOSIS — M1A49X0 Other secondary chronic gout, multiple sites, without tophus (tophi): Secondary | ICD-10-CM | POA: Diagnosis not present

## 2019-07-10 DIAGNOSIS — R05 Cough: Secondary | ICD-10-CM | POA: Diagnosis not present

## 2019-07-10 DIAGNOSIS — Z20828 Contact with and (suspected) exposure to other viral communicable diseases: Secondary | ICD-10-CM | POA: Diagnosis not present

## 2020-01-17 LAB — COLOGUARD: COLOGUARD: NEGATIVE

## 2020-01-17 LAB — EXTERNAL GENERIC LAB PROCEDURE: COLOGUARD: NEGATIVE

## 2020-07-25 DIAGNOSIS — M109 Gout, unspecified: Secondary | ICD-10-CM | POA: Diagnosis not present

## 2020-07-25 DIAGNOSIS — J449 Chronic obstructive pulmonary disease, unspecified: Secondary | ICD-10-CM | POA: Diagnosis not present

## 2020-07-25 DIAGNOSIS — R69 Illness, unspecified: Secondary | ICD-10-CM | POA: Diagnosis not present

## 2020-07-29 ENCOUNTER — Encounter (INDEPENDENT_AMBULATORY_CARE_PROVIDER_SITE_OTHER): Payer: Medicare HMO | Admitting: Nurse Practitioner

## 2020-08-10 DIAGNOSIS — Z79891 Long term (current) use of opiate analgesic: Secondary | ICD-10-CM | POA: Diagnosis not present

## 2020-08-10 DIAGNOSIS — M545 Low back pain, unspecified: Secondary | ICD-10-CM | POA: Diagnosis not present

## 2020-08-10 DIAGNOSIS — F17210 Nicotine dependence, cigarettes, uncomplicated: Secondary | ICD-10-CM | POA: Diagnosis not present

## 2020-08-10 DIAGNOSIS — R69 Illness, unspecified: Secondary | ICD-10-CM | POA: Diagnosis not present

## 2020-08-10 DIAGNOSIS — M25552 Pain in left hip: Secondary | ICD-10-CM | POA: Diagnosis not present

## 2020-08-10 DIAGNOSIS — M1A9XX0 Chronic gout, unspecified, without tophus (tophi): Secondary | ICD-10-CM | POA: Diagnosis not present

## 2020-08-10 DIAGNOSIS — M79672 Pain in left foot: Secondary | ICD-10-CM | POA: Diagnosis not present

## 2020-08-10 DIAGNOSIS — F1120 Opioid dependence, uncomplicated: Secondary | ICD-10-CM | POA: Diagnosis not present

## 2020-08-10 DIAGNOSIS — M1990 Unspecified osteoarthritis, unspecified site: Secondary | ICD-10-CM | POA: Diagnosis not present

## 2020-08-10 DIAGNOSIS — G894 Chronic pain syndrome: Secondary | ICD-10-CM | POA: Diagnosis not present

## 2020-08-19 ENCOUNTER — Encounter (INDEPENDENT_AMBULATORY_CARE_PROVIDER_SITE_OTHER): Payer: Medicare HMO | Admitting: Nurse Practitioner

## 2020-08-30 DIAGNOSIS — Z20822 Contact with and (suspected) exposure to covid-19: Secondary | ICD-10-CM | POA: Diagnosis not present

## 2020-09-07 DIAGNOSIS — M545 Low back pain, unspecified: Secondary | ICD-10-CM | POA: Diagnosis not present

## 2020-09-07 DIAGNOSIS — M1990 Unspecified osteoarthritis, unspecified site: Secondary | ICD-10-CM | POA: Diagnosis not present

## 2020-09-07 DIAGNOSIS — Z79891 Long term (current) use of opiate analgesic: Secondary | ICD-10-CM | POA: Diagnosis not present

## 2020-09-07 DIAGNOSIS — R69 Illness, unspecified: Secondary | ICD-10-CM | POA: Diagnosis not present

## 2020-09-07 DIAGNOSIS — M25552 Pain in left hip: Secondary | ICD-10-CM | POA: Diagnosis not present

## 2020-09-07 DIAGNOSIS — M79672 Pain in left foot: Secondary | ICD-10-CM | POA: Diagnosis not present

## 2020-09-07 DIAGNOSIS — F1120 Opioid dependence, uncomplicated: Secondary | ICD-10-CM | POA: Diagnosis not present

## 2020-09-07 DIAGNOSIS — M1A9XX0 Chronic gout, unspecified, without tophus (tophi): Secondary | ICD-10-CM | POA: Diagnosis not present

## 2020-09-07 DIAGNOSIS — G894 Chronic pain syndrome: Secondary | ICD-10-CM | POA: Diagnosis not present

## 2020-09-09 ENCOUNTER — Encounter (INDEPENDENT_AMBULATORY_CARE_PROVIDER_SITE_OTHER): Payer: Medicare HMO | Admitting: Nurse Practitioner

## 2020-09-23 ENCOUNTER — Encounter (INDEPENDENT_AMBULATORY_CARE_PROVIDER_SITE_OTHER): Payer: Medicare HMO | Admitting: Nurse Practitioner

## 2020-09-30 ENCOUNTER — Encounter (INDEPENDENT_AMBULATORY_CARE_PROVIDER_SITE_OTHER): Payer: Medicare HMO | Admitting: Nurse Practitioner

## 2020-10-14 DIAGNOSIS — J029 Acute pharyngitis, unspecified: Secondary | ICD-10-CM | POA: Diagnosis not present

## 2020-10-14 DIAGNOSIS — G894 Chronic pain syndrome: Secondary | ICD-10-CM | POA: Diagnosis not present

## 2020-10-28 ENCOUNTER — Other Ambulatory Visit: Payer: Self-pay

## 2020-10-28 ENCOUNTER — Encounter (INDEPENDENT_AMBULATORY_CARE_PROVIDER_SITE_OTHER): Payer: Self-pay | Admitting: Nurse Practitioner

## 2020-10-28 ENCOUNTER — Ambulatory Visit (INDEPENDENT_AMBULATORY_CARE_PROVIDER_SITE_OTHER): Payer: Medicare HMO | Admitting: Nurse Practitioner

## 2020-10-28 VITALS — BP 118/79 | HR 88 | Resp 16 | Ht 73.0 in | Wt 192.4 lb

## 2020-10-28 DIAGNOSIS — R202 Paresthesia of skin: Secondary | ICD-10-CM

## 2020-10-28 DIAGNOSIS — Z8719 Personal history of other diseases of the digestive system: Secondary | ICD-10-CM | POA: Diagnosis not present

## 2020-10-28 DIAGNOSIS — R69 Illness, unspecified: Secondary | ICD-10-CM | POA: Diagnosis not present

## 2020-10-28 DIAGNOSIS — R600 Localized edema: Secondary | ICD-10-CM

## 2020-10-28 DIAGNOSIS — Z9889 Other specified postprocedural states: Secondary | ICD-10-CM | POA: Diagnosis not present

## 2020-10-28 DIAGNOSIS — F17200 Nicotine dependence, unspecified, uncomplicated: Secondary | ICD-10-CM

## 2020-10-28 DIAGNOSIS — R200 Anesthesia of skin: Secondary | ICD-10-CM | POA: Diagnosis not present

## 2020-10-28 DIAGNOSIS — M7989 Other specified soft tissue disorders: Secondary | ICD-10-CM

## 2020-10-30 NOTE — Progress Notes (Signed)
Subjective:    Patient ID: Lance House, male    DOB: Aug 08, 1950, 70 y.o.   MRN: [id number] Chief Complaint  Patient presents with  . New Patient (Initial Visit)    Ref Enid Derry leg swelling    The patient presents today for evaluation of left lower extremity leg swelling.  The patient notes he that he began with a bad gout attack.  However the swelling persisted.  He proceeded to have hernia surgery and the swelling became substantially worse after surgery.  The patient had severe edema throughout following his surgery.  The right lower extremity is not nearly as bad.  The patient has had times where the left lower extremity has leaking from the leg.  There are no obvious ulcerations but there are some color changes.  The patient has issues with lower back pain as well which affects his ambulation.  He also has numbness in his lower extremities.  The patient has compression socks however due to severe swelling benefit appropriately and there is difficulty with wearing them.  Patient is also a current smoker with other possible comorbidities for PAD.  He denies any fever or chills.   Review of Systems  Cardiovascular: Positive for leg swelling.  Musculoskeletal: Positive for gait problem.  Skin: Positive for color change and wound.  All other systems reviewed and are negative.      Objective:   Physical Exam Vitals reviewed.  HENT:     Head: Normocephalic.  Cardiovascular:     Rate and Rhythm: Normal rate.  Pulmonary:     Effort: Pulmonary effort is normal.  Musculoskeletal:     Right lower leg: 1+ Edema present.     Left lower leg: 3+ Edema present.  Neurological:     Mental Status: He is alert and oriented to person, place, and time.     Motor: Weakness present.     Gait: Gait abnormal.  Psychiatric:        Mood and Affect: Mood normal.        Behavior: Behavior normal.        Thought Content: Thought content normal.        Judgment: Judgment normal.     BP 118/79 (BP  Location: Right Arm)   Pulse 88   Resp 16   Ht 6\' 1"  (1.854 m)   Wt 192 lb 6.4 oz (87.3 kg)   BMI 25.38 kg/m   Past Medical History:  Diagnosis Date  . Anxiety   . Depression   . Gout     Social History   Socioeconomic History  . Marital status: Married    Spouse name: Not on file  . Number of children: Not on file  . Years of education: Not on file  . Highest education level: Not on file  Occupational History  . Not on file  Tobacco Use  . Smoking status: Current Every Day Smoker    Packs/day: 2.00  . Smokeless tobacco: Never Used  Vaping Use  . Vaping Use: Never used  Substance and Sexual Activity  . Alcohol use: Yes    Alcohol/week: 42.0 standard drinks    Types: 42 Cans of beer per week  . Drug use: Not Currently  . Sexual activity: Not on file  Other Topics Concern  . Not on file  Social History Narrative  . Not on file   Social Determinants of Health   Financial Resource Strain: Not on file  Food Insecurity: Not on file  Transportation  Needs: Not on file  Physical Activity: Not on file  Stress: Not on file  Social Connections: Not on file  Intimate Partner Violence: Not on file    Past Surgical History:  Procedure Laterality Date  . APPENDECTOMY  1994  . INGUINAL HERNIA REPAIR Left 04/13/2019   Procedure: HERNIA REPAIR INGUINAL ADULT OPEN, LEFT;  Surgeon: Fredirick Maudlin, MD;  Location: [facility] ORS;  Service: General;  Laterality: Left;  . NASAL SINUS SURGERY  2015   [facility]    Family History  Problem Relation Age of Onset  . Skin cancer Father   . Multiple sclerosis Sister   . Non-Hodgkin's lymphoma Brother     Allergies  Allergen Reactions  . Ciprocin-Fluocin-Procin [Fluocinolone] Anaphylaxis    Hip hurt  . Ciprofloxacin   . Duloxetine Diarrhea, Nausea And Vomiting and Other (See Comments)    Altered mental status    CBC Latest Ref Rng & Units 04/14/2019 04/07/2019 04/07/2015  WBC 4.0 - 10.5 K/uL 10.9(H) 6.1 11.4(H)  Hemoglobin 13.0 - 17.0  g/dL 13.5 16.9 14.5  Hematocrit 39.0 - 52.0 % 40.7 49.6 42.2  Platelets 150 - 400 K/uL 174 199 238      CMP     Component Value Date/Time   NA 135 04/14/2019 0526   K 3.9 04/14/2019 0526   CL 103 04/14/2019 0526   CO2 26 04/14/2019 0526   GLUCOSE 125 (H) 04/14/2019 0526   BUN 8 04/14/2019 0526   CREATININE 0.67 04/14/2019 0526   CALCIUM 8.3 (L) 04/14/2019 0526   PROT 7.9 04/07/2019 2333   ALBUMIN 4.4 04/07/2019 2333   AST 33 04/07/2019 2333   ALT 25 04/07/2019 2333   ALKPHOS 64 04/07/2019 2333   BILITOT 1.2 04/07/2019 2333   GFRNONAA >60 04/14/2019 0526   GFRAA >60 04/14/2019 0526     No results found.     Assessment & Plan:   1. Leg swelling The patient has severe swelling of his left lower extremity.  While there is no open wounds or ulcerations currently the medial portion is very excoriated and then.  The patient has ongoing moments of weeping.  Based on this over the wraps would be helpful for controlling the patient's lower extremity edema so that he can place compression socks on.  We will continue with his Unna wraps for 4 weeks and at the patient's follow-up we will obtain a left lower extremity venous reflux study with ABIs. - VAS [place] ABI WITH/WO TBI; Future  2. Tobacco use disorder Smoking cessation was discussed, 3-10 minutes spent on this topic specifically   3. S/P inguinal hernia repair It is likely that the swelling worsened due to excessive volume post surgery.  4. Numbness and tingling of both feet The patient has several comorbidities including smoking which makes peripheral arterial disease a possibility.  Given the patient's numbness and discomfort of his bilateral lower extremities, ruling out any arterial disease would be prudent.  Patient will have ABIs done - VAS [place] LOWER EXTREMITY VENOUS REFLUX; Future   Current Outpatient Medications on File Prior to Visit  Medication Sig Dispense Refill  . albuterol (VENTOLIN HFA) 108 (90 Base) MCG/ACT  inhaler Albuterol Sulfate HFA 108 (90 Base) MCG/ACT Inhalation Aerosol Solution QTY: 18 gram Days: 30 Refills: 5  Written: 07/08/19 Patient Instructions: inhale 1-2 puffs every 4-6 hours for shortness of breath    . allopurinol (ZYLOPRIM) 300 MG tablet Take 300 mg by mouth daily.    . clonazePAM (KLONOPIN) 1 MG tablet  Take 1 mg by mouth 2 (two) times daily as needed.    . Omega-3 1000 MG CAPS Take by mouth daily.    . Oxycodone HCl 10 MG TABS Take 10 mg by mouth 3 (three) times daily as needed.    . Vitamin D, Ergocalciferol, (DRISDOL) 1.25 MG (50000 UNIT) CAPS capsule Take 1 capsule by mouth once a week.    [name] ibuprofen (ADVIL) 800 MG tablet Take 1 tablet (800 mg total) by mouth every 8 (eight) hours as needed. (Patient not taking: Reported on 10/28/2020) 30 tablet 0   No current facility-administered medications on file prior to visit.    There are no Patient Instructions on file for this visit. No follow-ups on file.   Kris Hartmann, NP

## 2020-10-31 ENCOUNTER — Telehealth (INDEPENDENT_AMBULATORY_CARE_PROVIDER_SITE_OTHER): Payer: Self-pay | Admitting: Nurse Practitioner

## 2020-10-31 ENCOUNTER — Encounter (INDEPENDENT_AMBULATORY_CARE_PROVIDER_SITE_OTHER): Payer: Self-pay | Admitting: Nurse Practitioner

## 2020-10-31 NOTE — Telephone Encounter (Signed)
Patient called in stating he would like a refill of his oxycodone.  Patient states he fell  He states he will need some that will last until his Thursdays appointments. Patient uses the [facility] in [place].  Patient would like to go back to the 10mg  3x a day.  I let patient know that the provider will review and will let him know the best course of action.

## 2020-10-31 NOTE — Telephone Encounter (Signed)
Patient has not received any pain medication from our providers and was advised to contact his PCP regarding more. Patient then wanted to change his appt in our office. Patient was transferred to front desk to have appt changed.

## 2020-11-02 DIAGNOSIS — M1289 Other specific arthropathies, not elsewhere classified, multiple sites: Secondary | ICD-10-CM | POA: Diagnosis not present

## 2020-11-02 DIAGNOSIS — J449 Chronic obstructive pulmonary disease, unspecified: Secondary | ICD-10-CM | POA: Diagnosis not present

## 2020-11-02 DIAGNOSIS — R69 Illness, unspecified: Secondary | ICD-10-CM | POA: Diagnosis not present

## 2020-11-02 DIAGNOSIS — E785 Hyperlipidemia, unspecified: Secondary | ICD-10-CM | POA: Diagnosis not present

## 2020-11-02 DIAGNOSIS — M6281 Muscle weakness (generalized): Secondary | ICD-10-CM | POA: Diagnosis not present

## 2020-11-02 DIAGNOSIS — I872 Venous insufficiency (chronic) (peripheral): Secondary | ICD-10-CM | POA: Diagnosis not present

## 2020-11-02 DIAGNOSIS — G894 Chronic pain syndrome: Secondary | ICD-10-CM | POA: Diagnosis not present

## 2020-11-02 DIAGNOSIS — I10 Essential (primary) hypertension: Secondary | ICD-10-CM | POA: Diagnosis not present

## 2020-11-03 ENCOUNTER — Encounter (INDEPENDENT_AMBULATORY_CARE_PROVIDER_SITE_OTHER): Payer: Medicare HMO

## 2020-11-07 ENCOUNTER — Other Ambulatory Visit: Payer: Self-pay

## 2020-11-07 ENCOUNTER — Ambulatory Visit (INDEPENDENT_AMBULATORY_CARE_PROVIDER_SITE_OTHER): Payer: Medicare HMO | Admitting: Nurse Practitioner

## 2020-11-07 VITALS — BP 112/81 | HR 103 | Ht 73.0 in | Wt 190.0 lb

## 2020-11-07 DIAGNOSIS — R600 Localized edema: Secondary | ICD-10-CM | POA: Diagnosis not present

## 2020-11-07 DIAGNOSIS — M7989 Other specified soft tissue disorders: Secondary | ICD-10-CM

## 2020-11-07 NOTE — Progress Notes (Signed)
History of Present Illness  There is no documented history at this time  Assessments & Plan   There are no diagnoses linked to this encounter.    Additional instructions  Subjective:  Patient presents with venous ulcer of the Left lower extremity.    Procedure:  3 layer unna wrap was placed Left lower extremity.   Plan:   Follow up in one week.  

## 2020-11-08 ENCOUNTER — Encounter (INDEPENDENT_AMBULATORY_CARE_PROVIDER_SITE_OTHER): Payer: Self-pay | Admitting: Nurse Practitioner

## 2020-11-11 ENCOUNTER — Other Ambulatory Visit: Payer: Self-pay

## 2020-11-11 ENCOUNTER — Ambulatory Visit (INDEPENDENT_AMBULATORY_CARE_PROVIDER_SITE_OTHER): Payer: Medicare HMO | Admitting: Nurse Practitioner

## 2020-11-11 ENCOUNTER — Encounter (INDEPENDENT_AMBULATORY_CARE_PROVIDER_SITE_OTHER): Payer: Self-pay | Admitting: Nurse Practitioner

## 2020-11-11 VITALS — BP 121/72 | HR 89 | Ht 73.0 in | Wt 188.0 lb

## 2020-11-11 DIAGNOSIS — R600 Localized edema: Secondary | ICD-10-CM

## 2020-11-11 DIAGNOSIS — M7989 Other specified soft tissue disorders: Secondary | ICD-10-CM

## 2020-11-11 NOTE — Progress Notes (Signed)
History of Present Illness  There is no documented history at this time  Assessments & Plan   There are no diagnoses linked to this encounter.    Additional instructions  Subjective:  Patient presents with venous ulcer of the Left lower extremity.    Procedure:  3 layer unna wrap was placed Left lower extremity.   Plan:   Follow up in one week.  

## 2020-11-17 NOTE — Telephone Encounter (Signed)
Patient called again requesting refill of oxycodone. Made patient was aware of CMA's note. Patient has appt tomorrow 11/18/20 at 1pm with FB

## 2020-11-18 ENCOUNTER — Ambulatory Visit (INDEPENDENT_AMBULATORY_CARE_PROVIDER_SITE_OTHER): Payer: Medicare HMO

## 2020-11-18 ENCOUNTER — Ambulatory Visit (INDEPENDENT_AMBULATORY_CARE_PROVIDER_SITE_OTHER): Payer: Medicare HMO | Admitting: Nurse Practitioner

## 2020-11-18 ENCOUNTER — Encounter (INDEPENDENT_AMBULATORY_CARE_PROVIDER_SITE_OTHER): Payer: Self-pay | Admitting: Nurse Practitioner

## 2020-11-18 ENCOUNTER — Other Ambulatory Visit: Payer: Self-pay

## 2020-11-18 VITALS — BP 125/81 | HR 84 | Resp 16

## 2020-11-18 DIAGNOSIS — M79672 Pain in left foot: Secondary | ICD-10-CM

## 2020-11-18 DIAGNOSIS — F17200 Nicotine dependence, unspecified, uncomplicated: Secondary | ICD-10-CM

## 2020-11-18 DIAGNOSIS — R202 Paresthesia of skin: Secondary | ICD-10-CM

## 2020-11-18 DIAGNOSIS — R600 Localized edema: Secondary | ICD-10-CM

## 2020-11-18 DIAGNOSIS — R200 Anesthesia of skin: Secondary | ICD-10-CM

## 2020-11-18 DIAGNOSIS — I872 Venous insufficiency (chronic) (peripheral): Secondary | ICD-10-CM | POA: Diagnosis not present

## 2020-11-18 DIAGNOSIS — R69 Illness, unspecified: Secondary | ICD-10-CM | POA: Diagnosis not present

## 2020-11-18 DIAGNOSIS — M7989 Other specified soft tissue disorders: Secondary | ICD-10-CM

## 2020-11-18 DIAGNOSIS — M109 Gout, unspecified: Secondary | ICD-10-CM | POA: Diagnosis not present

## 2020-11-18 DIAGNOSIS — R42 Dizziness and giddiness: Secondary | ICD-10-CM | POA: Diagnosis not present

## 2020-11-18 DIAGNOSIS — I10 Essential (primary) hypertension: Secondary | ICD-10-CM | POA: Diagnosis not present

## 2020-11-18 DIAGNOSIS — J449 Chronic obstructive pulmonary disease, unspecified: Secondary | ICD-10-CM | POA: Diagnosis not present

## 2020-11-18 DIAGNOSIS — E785 Hyperlipidemia, unspecified: Secondary | ICD-10-CM | POA: Diagnosis not present

## 2020-11-18 DIAGNOSIS — G894 Chronic pain syndrome: Secondary | ICD-10-CM | POA: Diagnosis not present

## 2020-11-20 ENCOUNTER — Encounter (INDEPENDENT_AMBULATORY_CARE_PROVIDER_SITE_OTHER): Payer: Self-pay | Admitting: Nurse Practitioner

## 2020-11-20 NOTE — Progress Notes (Signed)
Subjective:    Patient ID: Lance House, male    DOB: 12-15-1950, 70 y.o.   MRN: [id number] Chief Complaint  Patient presents with  . Follow-up    Ultrasound and unna check    Lance House is a 70 year old male.  The patient presents today for evaluation of left lower extremity swelling after being in Unna wraps for 4 weeks.  The patient also has had numbness and tingling in his lower extremities.  The patient notes that the swelling initially began as a gout attack however he noted that the swelling persisted.  His wife also notes that following his hernia surgery the swelling became substantially worse.  He had wounds and ulcerations.  Today, the wounds and ulcers are gone.  The swelling is very minimal following several weeks of Unna wraps.  The patient has tolerated them well and it is noted that his legs have not been this small in a long time.  He continues to have numbness in his lower extremities and he also notes that he is having pain in his left foot and has difficulty with putting weight onto his foot.  He denies any trauma or falls.  This came out of the blue.  There is no wounds or ulcerations.  The patient is a current smoker.  He denies any fever or chills  Today noninvasive studies show an ABI of 1.10 on the right and 1.04 on the left.  The patient has triphasic tibial artery waveforms noted in the right lower extremity with biphasic waveforms in the left lower extremity.  The patient has strong toe waveforms bilaterally.  Today noninvasive studies show evidence of deep venous insufficiency in the left common femoral vein with reflux in the great saphenous vein at the saphenofemoral junction.  There is no evidence of DVT or superficial thrombophlebitis seen bilaterally.    Review of Systems  Cardiovascular: Positive for leg swelling.  Musculoskeletal: Positive for arthralgias and gait problem.  All other systems reviewed and are negative.      Objective:   Physical  Exam Vitals reviewed.  HENT:     Head: Normocephalic.  Cardiovascular:     Rate and Rhythm: Normal rate.     Pulses: Decreased pulses.  Pulmonary:     Effort: Pulmonary effort is normal.  Musculoskeletal:     Right lower leg: 1+ Edema present.     Left lower leg: 1+ Edema present.  Neurological:     Mental Status: He is alert and oriented to person, place, and time.     Motor: Weakness present.     Gait: Gait abnormal.  Psychiatric:        Mood and Affect: Mood normal.        Behavior: Behavior normal.        Thought Content: Thought content normal.        Judgment: Judgment normal.     BP 125/81 (BP Location: Left Arm)   Pulse 84   Resp 16   Past Medical History:  Diagnosis Date  . Anxiety   . Depression   . Gout     Social History   Socioeconomic History  . Marital status: Married    Spouse name: Not on file  . Number of children: Not on file  . Years of education: Not on file  . Highest education level: Not on file  Occupational History  . Not on file  Tobacco Use  . Smoking status: Current Every Day Smoker  Packs/day: 2.00  . Smokeless tobacco: Never Used  Vaping Use  . Vaping Use: Never used  Substance and Sexual Activity  . Alcohol use: Yes    Alcohol/week: 42.0 standard drinks    Types: 42 Cans of beer per week  . Drug use: Not Currently  . Sexual activity: Not on file  Other Topics Concern  . Not on file  Social History Narrative  . Not on file   Social Determinants of Health   Financial Resource Strain: Not on file  Food Insecurity: Not on file  Transportation Needs: Not on file  Physical Activity: Not on file  Stress: Not on file  Social Connections: Not on file  Intimate Partner Violence: Not on file    Past Surgical History:  Procedure Laterality Date  . APPENDECTOMY  1994  . INGUINAL HERNIA REPAIR Left 04/13/2019   Procedure: HERNIA REPAIR INGUINAL ADULT OPEN, LEFT;  Surgeon: Fredirick Maudlin, MD;  Location: [facility] OR[facility];   Service: General;  Laterality: Left;  . NASAL SINUS SURGERY  2015   [facility]    Family History  Problem Relation Age of Onset  . Skin cancer Father   . Multiple sclerosis Sister   . Non-Hodgkin's lymphoma Brother     Allergies  Allergen Reactions  . Ciprocin-Fluocin-Procin [Fluocinolone] Anaphylaxis    Hip hurt  . Ciprofloxacin   . Duloxetine Diarrhea, Nausea And Vomiting and Other (See Comments)    Altered mental status    CBC Latest Ref Rng & Units 04/14/2019 04/07/2019 04/07/2015  WBC 4.0 - 10.5 K/uL 10.9(H) 6.1 11.4(H)  Hemoglobin 13.0 - 17.0 g/dL 13.5 16.9 14.5  Hematocrit 39.0 - 52.0 % 40.7 49.6 42.2  Platelets 150 - 400 K/uL 174 199 238      CMP     Component Value Date/Time   NA 135 04/14/2019 0526   K 3.9 04/14/2019 0526   CL 103 04/14/2019 0526   CO2 26 04/14/2019 0526   GLUCOSE 125 (H) 04/14/2019 0526   BUN 8 04/14/2019 0526   CREATININE 0.67 04/14/2019 0526   CALCIUM 8.3 (L) 04/14/2019 0526   PROT 7.9 04/07/2019 2333   ALBUMIN 4.4 04/07/2019 2333   AST 33 04/07/2019 2333   ALT 25 04/07/2019 2333   ALKPHOS 64 04/07/2019 2333   BILITOT 1.2 04/07/2019 2333   GFRNONAA >60 04/14/2019 0526   GFRAA >60 04/14/2019 0526     No results found.     Assessment & Plan:   1. Leg swelling Following 4 weeks of Unna wraps patient's lower extremity edema is greatly improved.  Based on this compression is recommended for the patient.  He should place compression on daily.  He should place the compression socks on first thing in the morning and remove them before bed.  Elevation will also be extremely helpful with exercise.  We will have the patient return in 3 months for reevaluation of leg swelling as well as to determine if lymphedema pump may be helpful for the patient.  2. Numbness and tingling of both feet Noninvasive studies revealed normal ABIs.  Numbness and tingling of both feet is likely related to other causes.  Patient is recommended to discuss with primary  care provider for further work-up and possible referral to neurology.  Patient does have some known back issues that may be the cause of his pain.  3. Tobacco use disorder Smoking cessation was discussed, 3-10 minutes spent on this topic specifically   4. Left foot pain Patient is having left  foot pain that happened suddenly.  Based on noninvasive studies today it is unlikely a vascular cause.  Patient has upcoming visit with his primary care provider.  Patient is advised to discuss with primary.  Care provider for further work-up and management.  Patient did request pain medicine and was offered tramadol but he declined.  Discussed with patient that we are unable to provide stronger pain medication at this time.   Current Outpatient Medications on File Prior to Visit  Medication Sig Dispense Refill  . albuterol (VENTOLIN HFA) 108 (90 Base) MCG/ACT inhaler Albuterol Sulfate HFA 108 (90 Base) MCG/ACT Inhalation Aerosol Solution QTY: 18 gram Days: 30 Refills: 5  Written: 07/08/19 Patient Instructions: inhale 1-2 puffs every 4-6 hours for shortness of breath    . allopurinol (ZYLOPRIM) 300 MG tablet Take 300 mg by mouth daily.    . clonazePAM (KLONOPIN) 1 MG tablet Take 1 mg by mouth 2 (two) times daily as needed.    . Omega-3 1000 MG CAPS Take by mouth daily.    . Oxycodone HCl 10 MG TABS Take 10 mg by mouth 3 (three) times daily as needed.    . Vitamin D, Ergocalciferol, (DRISDOL) 1.25 MG (50000 UNIT) CAPS capsule Take 1 capsule by mouth once a week.    [name] ibuprofen (ADVIL) 800 MG tablet Take 1 tablet (800 mg total) by mouth every 8 (eight) hours as needed. (Patient not taking: No sig reported) 30 tablet 0   No current facility-administered medications on file prior to visit.    There are no Patient Instructions on file for this visit. No follow-ups on file.   Kris Hartmann, NP

## 2020-12-01 ENCOUNTER — Telehealth (INDEPENDENT_AMBULATORY_CARE_PROVIDER_SITE_OTHER): Payer: Self-pay

## 2020-12-01 NOTE — Telephone Encounter (Signed)
I spoke to the pt and made him aware of Dr. Nino Parsley instructions.

## 2020-12-01 NOTE — Telephone Encounter (Signed)
Pt called and left a VM on the  Nurses line wanting to know What is the name of the paste of the second layer of the unna wrap, due to him wanting to buy some for the up keep of his leg, the pt did mention that his leg is not getting worse its more of a prevention measure. Per Dr.Schnier the pt should use compression I called and left a VM for the pt to call the office to make him aware .

## 2021-01-27 DIAGNOSIS — Z20822 Contact with and (suspected) exposure to covid-19: Secondary | ICD-10-CM | POA: Diagnosis not present

## 2021-02-02 ENCOUNTER — Encounter (INDEPENDENT_AMBULATORY_CARE_PROVIDER_SITE_OTHER): Payer: Self-pay

## 2021-02-10 DIAGNOSIS — X58XXXD Exposure to other specified factors, subsequent encounter: Secondary | ICD-10-CM | POA: Diagnosis not present

## 2021-02-10 DIAGNOSIS — S8261XD Displaced fracture of lateral malleolus of right fibula, subsequent encounter for closed fracture with routine healing: Secondary | ICD-10-CM | POA: Diagnosis not present

## 2021-02-10 DIAGNOSIS — S8261XA Displaced fracture of lateral malleolus of right fibula, initial encounter for closed fracture: Secondary | ICD-10-CM | POA: Diagnosis not present

## 2021-02-10 DIAGNOSIS — M19071 Primary osteoarthritis, right ankle and foot: Secondary | ICD-10-CM | POA: Diagnosis not present

## 2021-02-10 DIAGNOSIS — B351 Tinea unguium: Secondary | ICD-10-CM | POA: Diagnosis not present

## 2021-02-10 DIAGNOSIS — M79671 Pain in right foot: Secondary | ICD-10-CM | POA: Diagnosis not present

## 2021-02-10 DIAGNOSIS — M25571 Pain in right ankle and joints of right foot: Secondary | ICD-10-CM | POA: Diagnosis not present

## 2021-02-10 DIAGNOSIS — R69 Illness, unspecified: Secondary | ICD-10-CM | POA: Diagnosis not present

## 2021-02-13 DIAGNOSIS — S82831A Other fracture of upper and lower end of right fibula, initial encounter for closed fracture: Secondary | ICD-10-CM | POA: Diagnosis not present

## 2021-02-13 DIAGNOSIS — S99911A Unspecified injury of right ankle, initial encounter: Secondary | ICD-10-CM | POA: Diagnosis not present

## 2021-02-13 DIAGNOSIS — S82839A Other fracture of upper and lower end of unspecified fibula, initial encounter for closed fracture: Secondary | ICD-10-CM | POA: Insufficient documentation

## 2021-02-13 HISTORY — DX: Other fracture of upper and lower end of unspecified fibula, initial encounter for closed fracture: S82.839A

## 2021-02-13 HISTORY — DX: Unspecified injury of right ankle, initial encounter: S99.911A

## 2021-02-17 ENCOUNTER — Ambulatory Visit (INDEPENDENT_AMBULATORY_CARE_PROVIDER_SITE_OTHER): Payer: Medicare HMO | Admitting: Nurse Practitioner

## 2021-02-20 DIAGNOSIS — S82831A Other fracture of upper and lower end of right fibula, initial encounter for closed fracture: Secondary | ICD-10-CM | POA: Diagnosis not present

## 2021-03-29 DIAGNOSIS — S82831A Other fracture of upper and lower end of right fibula, initial encounter for closed fracture: Secondary | ICD-10-CM | POA: Diagnosis not present

## 2021-04-07 DIAGNOSIS — E559 Vitamin D deficiency, unspecified: Secondary | ICD-10-CM | POA: Diagnosis not present

## 2021-04-07 DIAGNOSIS — E785 Hyperlipidemia, unspecified: Secondary | ICD-10-CM | POA: Diagnosis not present

## 2021-04-07 DIAGNOSIS — J449 Chronic obstructive pulmonary disease, unspecified: Secondary | ICD-10-CM | POA: Diagnosis not present

## 2021-04-07 DIAGNOSIS — A46 Erysipelas: Secondary | ICD-10-CM | POA: Diagnosis not present

## 2021-04-07 DIAGNOSIS — R69 Illness, unspecified: Secondary | ICD-10-CM | POA: Diagnosis not present

## 2021-04-07 DIAGNOSIS — M1A49X0 Other secondary chronic gout, multiple sites, without tophus (tophi): Secondary | ICD-10-CM | POA: Diagnosis not present

## 2021-04-07 DIAGNOSIS — D519 Vitamin B12 deficiency anemia, unspecified: Secondary | ICD-10-CM | POA: Diagnosis not present

## 2021-05-09 ENCOUNTER — Encounter: Payer: Self-pay | Admitting: General Surgery

## 2021-08-15 ENCOUNTER — Emergency Department: Payer: Medicare HMO

## 2021-08-15 ENCOUNTER — Other Ambulatory Visit: Payer: Self-pay

## 2021-08-15 ENCOUNTER — Inpatient Hospital Stay
Admission: EM | Admit: 2021-08-15 | Discharge: 2021-08-24 | DRG: 964 | Disposition: A | Discharge status: Skilled Nursing Facility | Payer: Medicare HMO | Attending: Student in an Organized Health Care Education/Training Program | Admitting: Student in an Organized Health Care Education/Training Program

## 2021-08-15 DIAGNOSIS — E871 Hypo-osmolality and hyponatremia: Secondary | ICD-10-CM | POA: Diagnosis not present

## 2021-08-15 DIAGNOSIS — M6282 Rhabdomyolysis: Secondary | ICD-10-CM

## 2021-08-15 DIAGNOSIS — Z79899 Other long term (current) drug therapy: Secondary | ICD-10-CM | POA: Diagnosis not present

## 2021-08-15 DIAGNOSIS — F10920 Alcohol use, unspecified with intoxication, uncomplicated: Secondary | ICD-10-CM | POA: Diagnosis not present

## 2021-08-15 DIAGNOSIS — F10220 Alcohol dependence with intoxication, uncomplicated: Secondary | ICD-10-CM | POA: Diagnosis not present

## 2021-08-15 DIAGNOSIS — S22080D Wedge compression fracture of T11-T12 vertebra, subsequent encounter for fracture with routine healing: Secondary | ICD-10-CM | POA: Diagnosis not present

## 2021-08-15 DIAGNOSIS — M545 Low back pain, unspecified: Secondary | ICD-10-CM | POA: Diagnosis not present

## 2021-08-15 DIAGNOSIS — W1830XA Fall on same level, unspecified, initial encounter: Secondary | ICD-10-CM | POA: Diagnosis present

## 2021-08-15 DIAGNOSIS — S22000A Wedge compression fracture of unspecified thoracic vertebra, initial encounter for closed fracture: Secondary | ICD-10-CM | POA: Diagnosis not present

## 2021-08-15 DIAGNOSIS — R600 Localized edema: Secondary | ICD-10-CM | POA: Diagnosis not present

## 2021-08-15 DIAGNOSIS — W19XXXA Unspecified fall, initial encounter: Secondary | ICD-10-CM | POA: Diagnosis not present

## 2021-08-15 DIAGNOSIS — F419 Anxiety disorder, unspecified: Secondary | ICD-10-CM | POA: Diagnosis present

## 2021-08-15 DIAGNOSIS — G8929 Other chronic pain: Secondary | ICD-10-CM | POA: Diagnosis not present

## 2021-08-15 DIAGNOSIS — M25552 Pain in left hip: Secondary | ICD-10-CM | POA: Diagnosis not present

## 2021-08-15 DIAGNOSIS — R52 Pain, unspecified: Secondary | ICD-10-CM

## 2021-08-15 DIAGNOSIS — M4854XA Collapsed vertebra, not elsewhere classified, thoracic region, initial encounter for fracture: Secondary | ICD-10-CM | POA: Diagnosis not present

## 2021-08-15 DIAGNOSIS — F17210 Nicotine dependence, cigarettes, uncomplicated: Secondary | ICD-10-CM | POA: Diagnosis present

## 2021-08-15 DIAGNOSIS — I619 Nontraumatic intracerebral hemorrhage, unspecified: Secondary | ICD-10-CM

## 2021-08-15 DIAGNOSIS — S32010A Wedge compression fracture of first lumbar vertebra, initial encounter for closed fracture: Secondary | ICD-10-CM | POA: Diagnosis not present

## 2021-08-15 DIAGNOSIS — K7010 Alcoholic hepatitis without ascites: Secondary | ICD-10-CM | POA: Diagnosis present

## 2021-08-15 DIAGNOSIS — Z881 Allergy status to other antibiotic agents status: Secondary | ICD-10-CM

## 2021-08-15 DIAGNOSIS — M79662 Pain in left lower leg: Secondary | ICD-10-CM | POA: Diagnosis not present

## 2021-08-15 DIAGNOSIS — E876 Hypokalemia: Secondary | ICD-10-CM | POA: Diagnosis not present

## 2021-08-15 DIAGNOSIS — I10 Essential (primary) hypertension: Secondary | ICD-10-CM | POA: Diagnosis not present

## 2021-08-15 DIAGNOSIS — R601 Generalized edema: Secondary | ICD-10-CM | POA: Diagnosis not present

## 2021-08-15 DIAGNOSIS — G319 Degenerative disease of nervous system, unspecified: Secondary | ICD-10-CM | POA: Diagnosis not present

## 2021-08-15 DIAGNOSIS — S1093XA Contusion of unspecified part of neck, initial encounter: Secondary | ICD-10-CM | POA: Diagnosis not present

## 2021-08-15 DIAGNOSIS — S0634AA Traumatic hemorrhage of right cerebrum with loss of consciousness status unknown, initial encounter: Secondary | ICD-10-CM | POA: Diagnosis not present

## 2021-08-15 DIAGNOSIS — R079 Chest pain, unspecified: Secondary | ICD-10-CM | POA: Diagnosis not present

## 2021-08-15 DIAGNOSIS — Z888 Allergy status to other drugs, medicaments and biological substances status: Secondary | ICD-10-CM | POA: Diagnosis not present

## 2021-08-15 DIAGNOSIS — M109 Gout, unspecified: Secondary | ICD-10-CM | POA: Diagnosis present

## 2021-08-15 DIAGNOSIS — I615 Nontraumatic intracerebral hemorrhage, intraventricular: Secondary | ICD-10-CM | POA: Diagnosis not present

## 2021-08-15 DIAGNOSIS — F32A Depression, unspecified: Secondary | ICD-10-CM | POA: Diagnosis not present

## 2021-08-15 DIAGNOSIS — I160 Hypertensive urgency: Secondary | ICD-10-CM

## 2021-08-15 DIAGNOSIS — T796XXA Traumatic ischemia of muscle, initial encounter: Secondary | ICD-10-CM

## 2021-08-15 DIAGNOSIS — M546 Pain in thoracic spine: Secondary | ICD-10-CM | POA: Diagnosis present

## 2021-08-15 DIAGNOSIS — R7989 Other specified abnormal findings of blood chemistry: Secondary | ICD-10-CM

## 2021-08-15 DIAGNOSIS — Z743 Need for continuous supervision: Secondary | ICD-10-CM | POA: Diagnosis not present

## 2021-08-15 DIAGNOSIS — R778 Other specified abnormalities of plasma proteins: Secondary | ICD-10-CM | POA: Diagnosis present

## 2021-08-15 DIAGNOSIS — Y92000 Kitchen of unspecified non-institutional (private) residence as  the place of occurrence of the external cause: Secondary | ICD-10-CM | POA: Diagnosis not present

## 2021-08-15 DIAGNOSIS — M549 Dorsalgia, unspecified: Secondary | ICD-10-CM | POA: Diagnosis not present

## 2021-08-15 DIAGNOSIS — S32040A Wedge compression fracture of fourth lumbar vertebra, initial encounter for closed fracture: Secondary | ICD-10-CM | POA: Diagnosis not present

## 2021-08-15 DIAGNOSIS — W3400XA Accidental discharge from unspecified firearms or gun, initial encounter: Secondary | ICD-10-CM

## 2021-08-15 DIAGNOSIS — Z7401 Bed confinement status: Secondary | ICD-10-CM | POA: Diagnosis not present

## 2021-08-15 DIAGNOSIS — D72829 Elevated white blood cell count, unspecified: Secondary | ICD-10-CM | POA: Diagnosis not present

## 2021-08-15 DIAGNOSIS — M48061 Spinal stenosis, lumbar region without neurogenic claudication: Secondary | ICD-10-CM | POA: Diagnosis not present

## 2021-08-15 DIAGNOSIS — G9519 Other vascular myelopathies: Secondary | ICD-10-CM | POA: Diagnosis not present

## 2021-08-15 DIAGNOSIS — M25452 Effusion, left hip: Secondary | ICD-10-CM | POA: Diagnosis not present

## 2021-08-15 DIAGNOSIS — R519 Headache, unspecified: Secondary | ICD-10-CM | POA: Diagnosis not present

## 2021-08-15 DIAGNOSIS — R58 Hemorrhage, not elsewhere classified: Secondary | ICD-10-CM | POA: Diagnosis not present

## 2021-08-15 DIAGNOSIS — R9431 Abnormal electrocardiogram [ECG] [EKG]: Secondary | ICD-10-CM | POA: Diagnosis not present

## 2021-08-15 DIAGNOSIS — I739 Peripheral vascular disease, unspecified: Secondary | ICD-10-CM | POA: Diagnosis not present

## 2021-08-15 DIAGNOSIS — Z9181 History of falling: Secondary | ICD-10-CM

## 2021-08-15 DIAGNOSIS — R102 Pelvic and perineal pain: Secondary | ICD-10-CM | POA: Diagnosis not present

## 2021-08-15 DIAGNOSIS — J90 Pleural effusion, not elsewhere classified: Secondary | ICD-10-CM | POA: Diagnosis not present

## 2021-08-15 DIAGNOSIS — M4856XA Collapsed vertebra, not elsewhere classified, lumbar region, initial encounter for fracture: Secondary | ICD-10-CM | POA: Diagnosis present

## 2021-08-15 DIAGNOSIS — R69 Illness, unspecified: Secondary | ICD-10-CM | POA: Diagnosis not present

## 2021-08-15 DIAGNOSIS — E878 Other disorders of electrolyte and fluid balance, not elsewhere classified: Secondary | ICD-10-CM | POA: Diagnosis not present

## 2021-08-15 DIAGNOSIS — W1839XA Other fall on same level, initial encounter: Secondary | ICD-10-CM | POA: Diagnosis not present

## 2021-08-15 DIAGNOSIS — S06360A Traumatic hemorrhage of cerebrum, unspecified, without loss of consciousness, initial encounter: Secondary | ICD-10-CM | POA: Diagnosis not present

## 2021-08-15 DIAGNOSIS — S22080A Wedge compression fracture of T11-T12 vertebra, initial encounter for closed fracture: Secondary | ICD-10-CM | POA: Diagnosis not present

## 2021-08-15 DIAGNOSIS — S0636AA Traumatic hemorrhage of cerebrum, unspecified, with loss of consciousness status unknown, initial encounter: Secondary | ICD-10-CM | POA: Diagnosis not present

## 2021-08-15 DIAGNOSIS — J439 Emphysema, unspecified: Secondary | ICD-10-CM | POA: Diagnosis not present

## 2021-08-15 DIAGNOSIS — M47812 Spondylosis without myelopathy or radiculopathy, cervical region: Secondary | ICD-10-CM | POA: Diagnosis not present

## 2021-08-15 DIAGNOSIS — Z20822 Contact with and (suspected) exposure to covid-19: Secondary | ICD-10-CM | POA: Diagnosis not present

## 2021-08-15 HISTORY — DX: Nontraumatic intracerebral hemorrhage, unspecified: I61.9

## 2021-08-15 LAB — CBC WITH DIFFERENTIAL/PLATELET
Abs Immature Granulocytes: 0.14 10*3/uL — ABNORMAL HIGH (ref 0.00–0.07)
Basophils Absolute: 0.1 10*3/uL (ref 0.0–0.1)
Basophils Relative: 0 %
Eosinophils Absolute: 0 10*3/uL (ref 0.0–0.5)
Eosinophils Relative: 0 %
HCT: 41.3 % (ref 39.0–52.0)
Hemoglobin: 15.3 g/dL (ref 13.0–17.0)
Immature Granulocytes: 1 %
Lymphocytes Relative: 2 %
Lymphs Abs: 0.4 10*3/uL — ABNORMAL LOW (ref 0.7–4.0)
MCH: 32.9 pg (ref 26.0–34.0)
MCHC: 37 g/dL — ABNORMAL HIGH (ref 30.0–36.0)
MCV: 88.8 fL (ref 80.0–100.0)
Monocytes Absolute: 0.9 10*3/uL (ref 0.1–1.0)
Monocytes Relative: 6 %
Neutro Abs: 13.3 10*3/uL — ABNORMAL HIGH (ref 1.7–7.7)
Neutrophils Relative %: 91 %
Platelets: 181 10*3/uL (ref 150–400)
RBC: 4.65 MIL/uL (ref 4.22–5.81)
RDW: 13.2 % (ref 11.5–15.5)
WBC: 14.8 10*3/uL — ABNORMAL HIGH (ref 4.0–10.5)
nRBC: 0 % (ref 0.0–0.2)

## 2021-08-15 LAB — CK
Total CK: 5975 U/L — ABNORMAL HIGH (ref 49–397)
Total CK: 3059 U/L — ABNORMAL HIGH (ref 49–397)

## 2021-08-15 LAB — URINALYSIS, MICROSCOPIC (REFLEX)
Bacteria, UA: NONE SEEN
Squamous Epithelial / HPF: NONE SEEN (ref 0–5)

## 2021-08-15 LAB — URINALYSIS, ROUTINE W REFLEX MICROSCOPIC
Bilirubin Urine: NEGATIVE
Glucose, UA: NEGATIVE mg/dL
Ketones, ur: 40 mg/dL — AB
Leukocytes,Ua: NEGATIVE
Nitrite: NEGATIVE
Protein, ur: 30 mg/dL — AB
Specific Gravity, Urine: 1.02 (ref 1.005–1.030)
pH: 5.5 (ref 5.0–8.0)

## 2021-08-15 LAB — URINE DRUG SCREEN, QUALITATIVE (ARMC ONLY)
Amphetamines, Ur Screen: NOT DETECTED
Barbiturates, Ur Screen: NOT DETECTED
Benzodiazepine, Ur Scrn: NOT DETECTED
Cannabinoid 50 Ng, Ur ~~LOC~~: NOT DETECTED
Cocaine Metabolite,Ur ~~LOC~~: NOT DETECTED
MDMA (Ecstasy)Ur Screen: NOT DETECTED
Methadone Scn, Ur: NOT DETECTED
Opiate, Ur Screen: POSITIVE — AB
Phencyclidine (PCP) Ur S: NOT DETECTED
Tricyclic, Ur Screen: NOT DETECTED

## 2021-08-15 LAB — COMPREHENSIVE METABOLIC PANEL
ALT: 48 U/L — ABNORMAL HIGH (ref 0–44)
AST: 144 U/L — ABNORMAL HIGH (ref 15–41)
Albumin: 3.3 g/dL — ABNORMAL LOW (ref 3.5–5.0)
Alkaline Phosphatase: 93 U/L (ref 38–126)
Anion gap: 12 (ref 5–15)
BUN: <5 mg/dL — ABNORMAL LOW (ref 8–23)
CO2: 21 mmol/L — ABNORMAL LOW (ref 22–32)
Calcium: 8.2 mg/dL — ABNORMAL LOW (ref 8.9–10.3)
Chloride: 79 mmol/L — ABNORMAL LOW (ref 98–111)
Creatinine, Ser: 0.37 mg/dL — ABNORMAL LOW (ref 0.61–1.24)
GFR, Estimated: >60 mL/min (ref >60)
Glucose, Bld: 92 mg/dL (ref 70–99)
Potassium: 3.7 mmol/L (ref 3.5–5.1)
Sodium: 112 mmol/L — CL (ref 135–145)
Total Bilirubin: 2.2 mg/dL — ABNORMAL HIGH (ref 0.3–1.2)
Total Protein: 6.7 g/dL (ref 6.5–8.1)

## 2021-08-15 LAB — TSH: TSH: 1.319 u[IU]/mL (ref 0.350–4.500)

## 2021-08-15 LAB — RESP PANEL BY RT-PCR (FLU A&B, COVID) ARPGX2
Influenza A by PCR: NEGATIVE
Influenza B by PCR: NEGATIVE
SARS Coronavirus 2 by RT PCR: NEGATIVE

## 2021-08-15 LAB — LIPASE, BLOOD: Lipase: 24 U/L (ref 11–51)

## 2021-08-15 LAB — ETHANOL: Alcohol, Ethyl (B): <10 mg/dL (ref <10)

## 2021-08-15 LAB — OSMOLALITY: Osmolality: 253 mOsm/kg — ABNORMAL LOW (ref 275–295)

## 2021-08-15 LAB — TROPONIN I (HIGH SENSITIVITY)
Troponin I (High Sensitivity): 582 ng/L (ref <18)
Troponin I (High Sensitivity): 813 ng/L (ref <18)
Troponin I (High Sensitivity): 903 ng/L (ref <18)

## 2021-08-15 LAB — NA AND K (SODIUM & POTASSIUM), RAND UR
Potassium Urine: 56 mmol/L
Sodium, Ur: 14 mmol/L

## 2021-08-15 LAB — OSMOLALITY, URINE: Osmolality, Ur: 389 mOsm/kg (ref 300–900)

## 2021-08-15 IMAGING — CR DG CHEST 1V
2 series · 2 of 2 positions shown · non-contrast
Comparison: None.

CLINICAL DATA: Fall with lower back pain

EXAM:
CHEST  1 VIEW

[chest pa (1 of 2)]
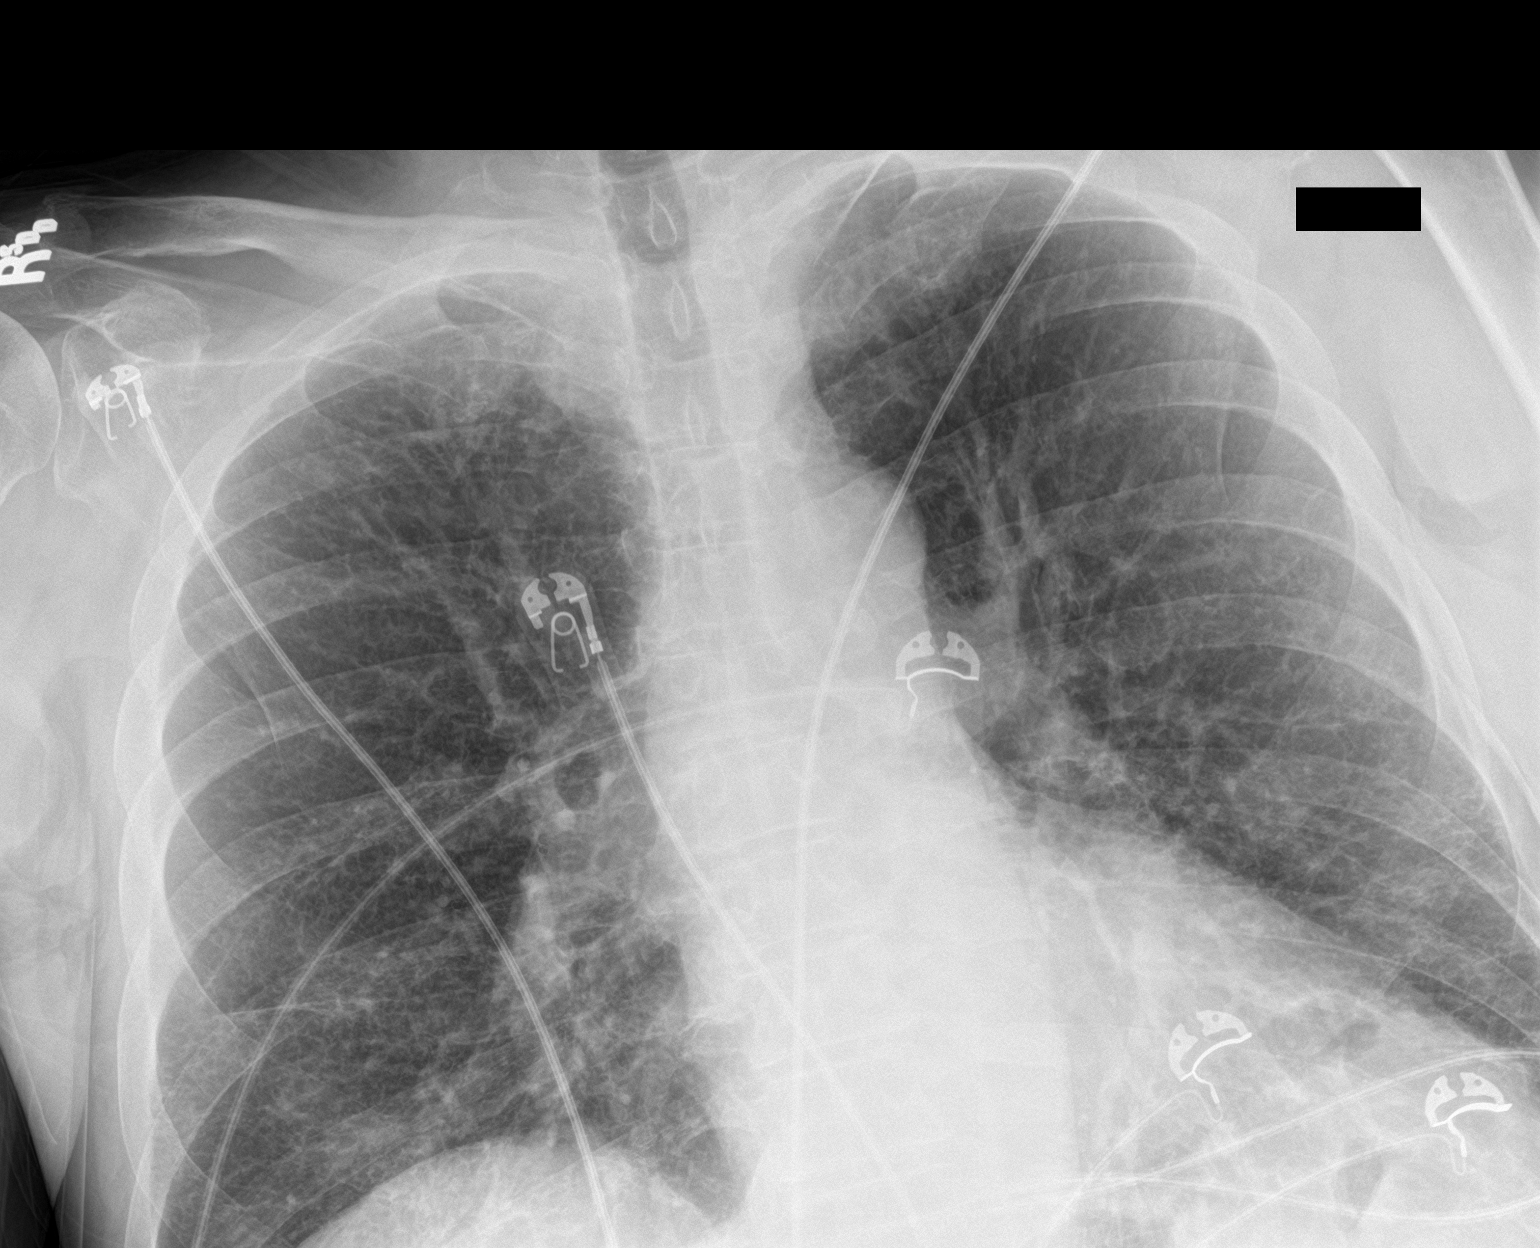

[chest pa (2 of 2)]
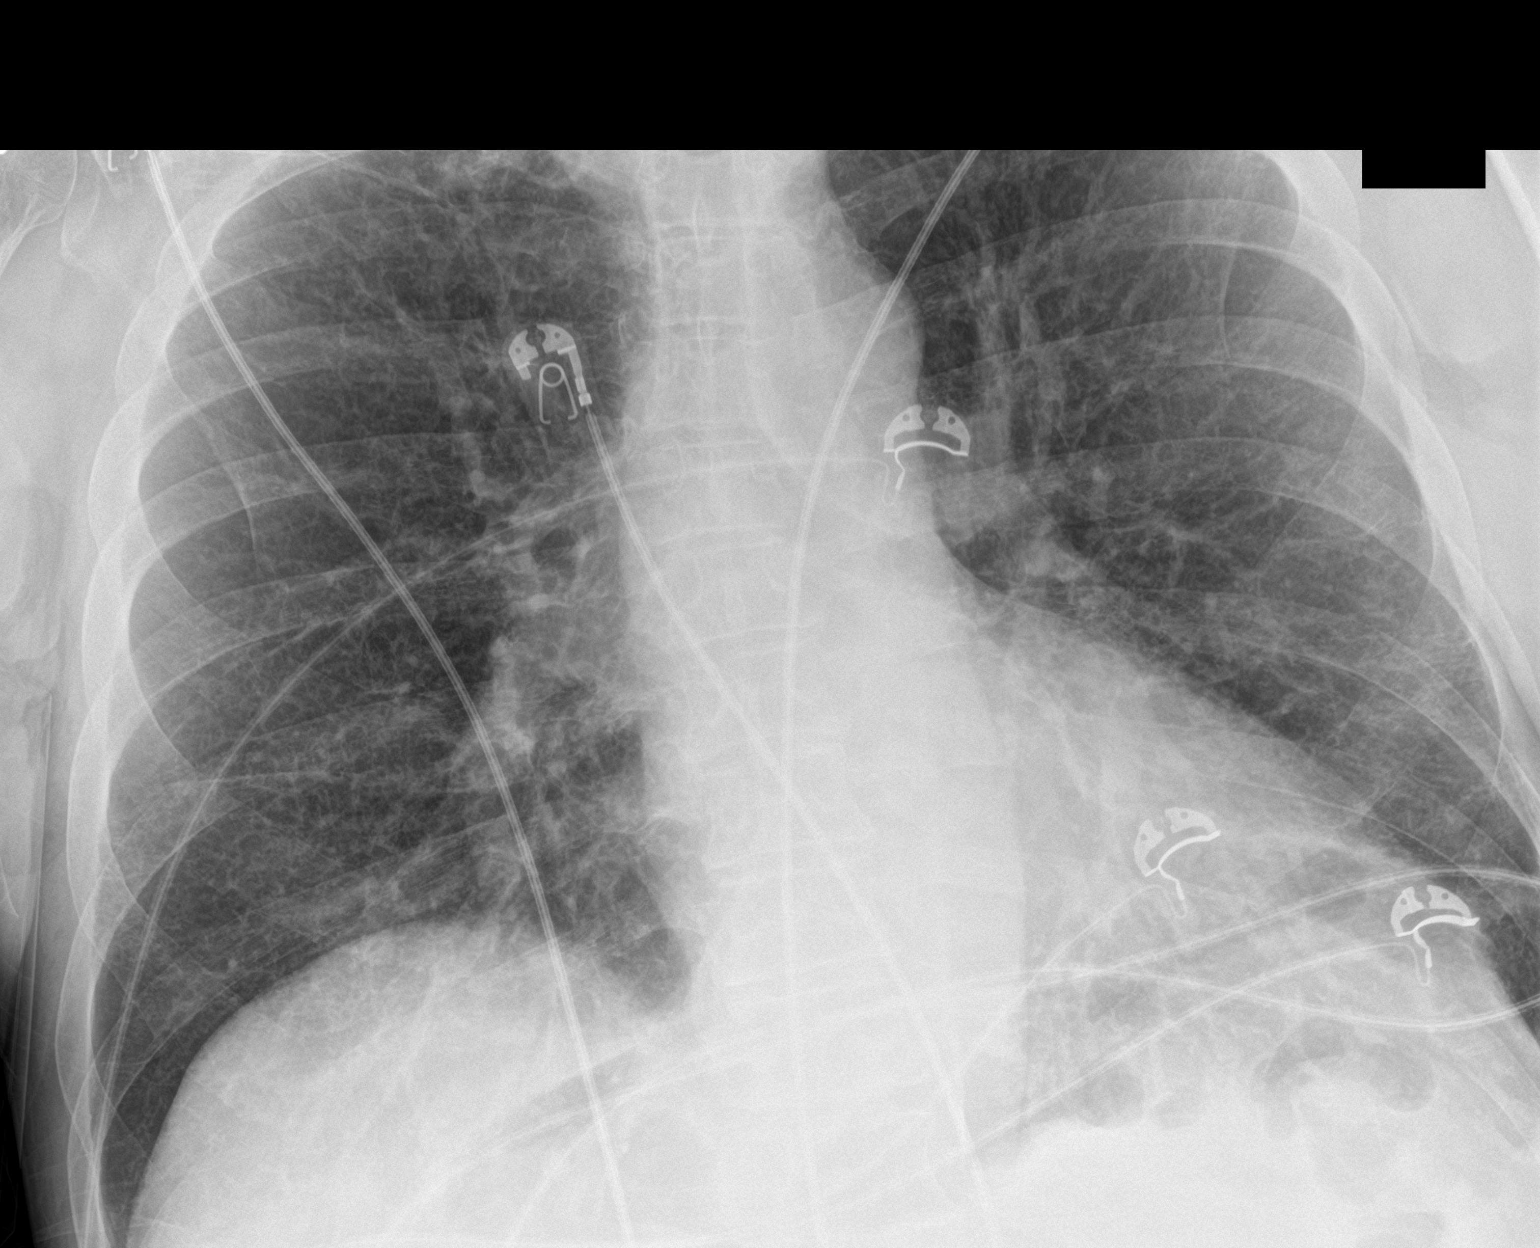

[2 of 2 positions shown; findings below may reference images not displayed]

FINDINGS: Interstitial coarsening favoring scar. No generalized congestive
features. Normal heart size and aortic contours. No visible
fracture.
IMPRESSION: No evidence of acute injury.

## 2021-08-15 IMAGING — CT CT HEAD W/O CM
4 series · 16 of 47 positions shown, 18 images · non-contrast
Comparison: None.

CLINICAL DATA: Neck trauma.  Head trauma.



[Series 2: head wo · axial · 0.43mm/px · z∈[+275,+395]mm · 7 of 34 slices shown, 9 images]
[im 5/34  brain]
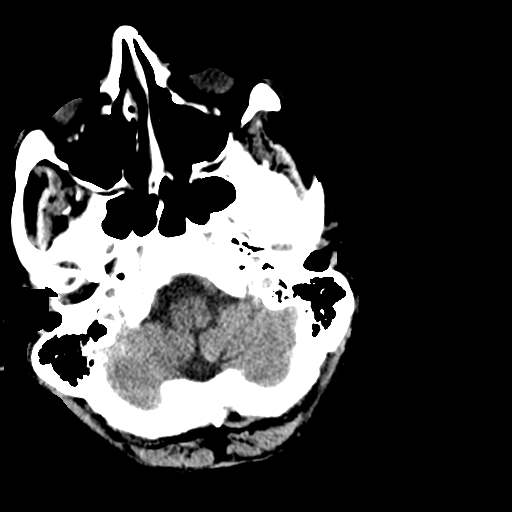
[im 5/34  bone]
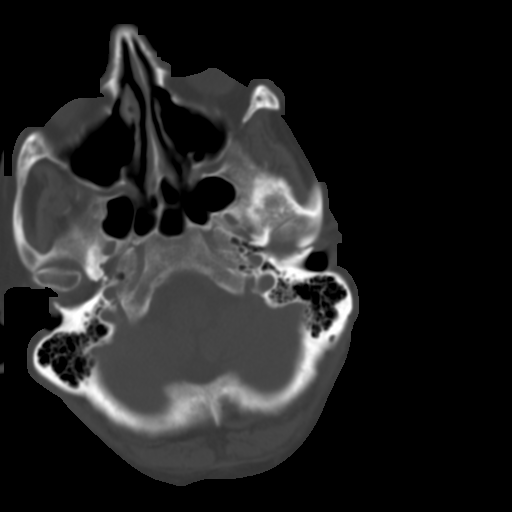
[im 9/34  brain]
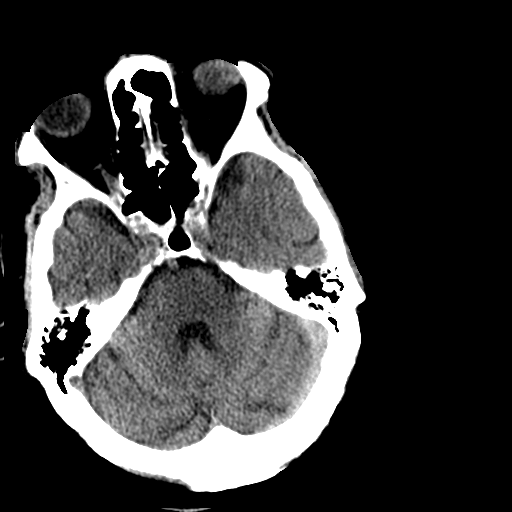
[im 13/34  brain]
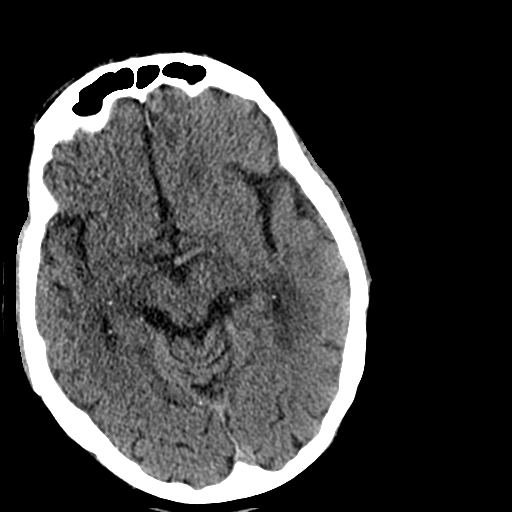
[im 17/34  brain]
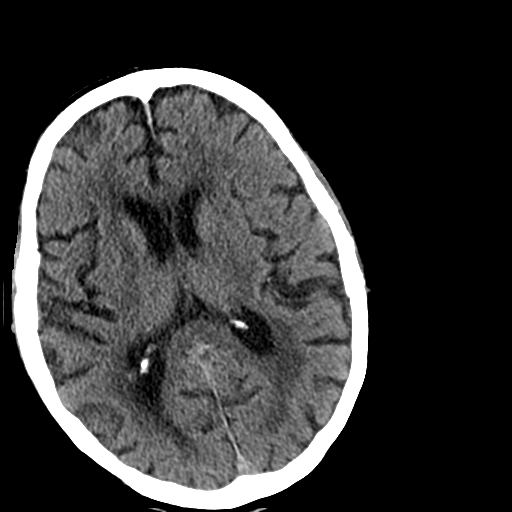
[im 21/34  brain]
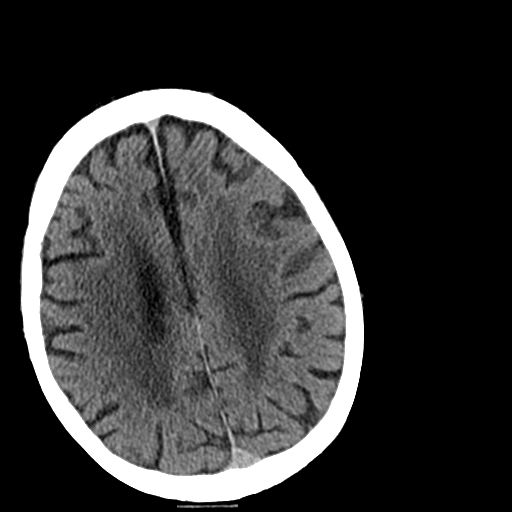
[im 21/34  bone]
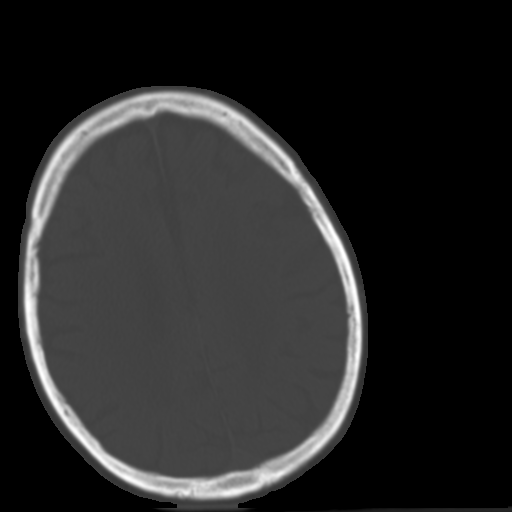
[im 25/34  brain]
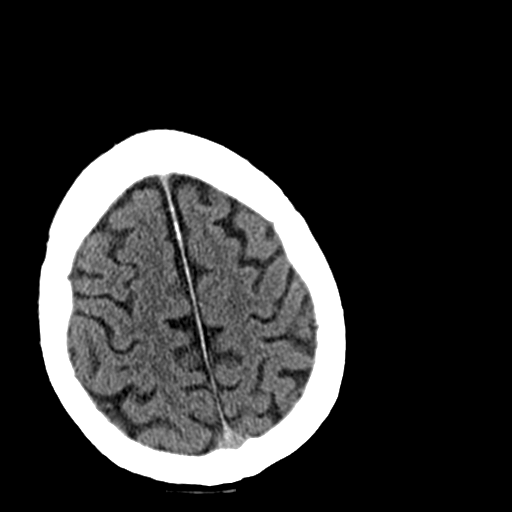
[im 29/34  brain]
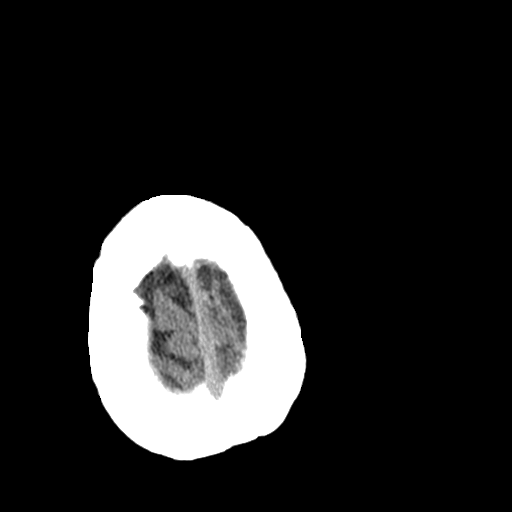

[Series 3: head bone · axial · 0.43mm/px · z∈[+271,+303]mm · 3 of 84 slices shown]
[im 9/84  bone]
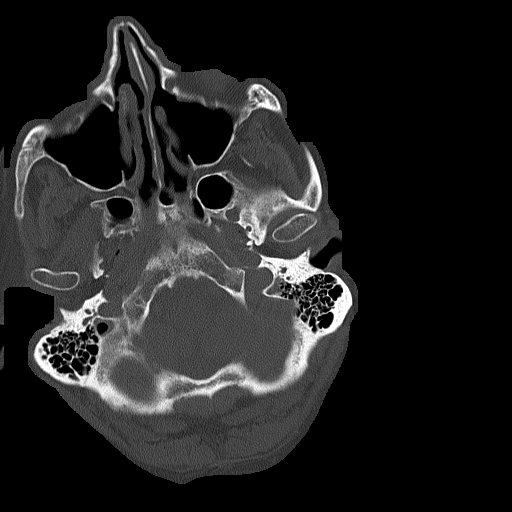
[im 17/84  bone]
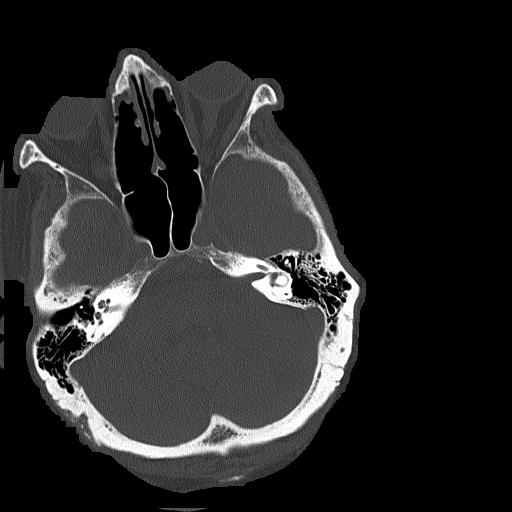
[im 25/84  bone]
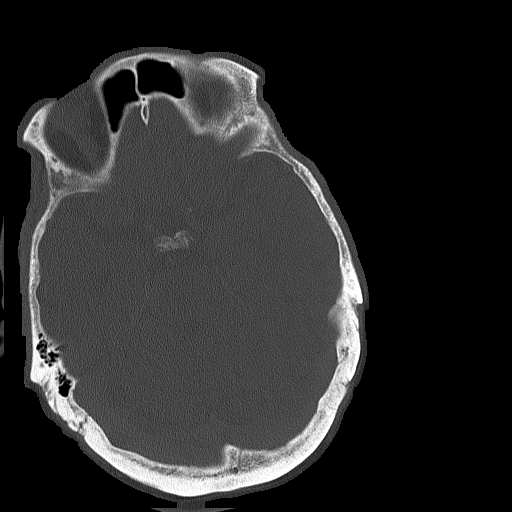

[Series 4: coronal soft tissue · coronal · 0.31mm/px · 3 of 75 slices shown]
[im 25/75  brain]
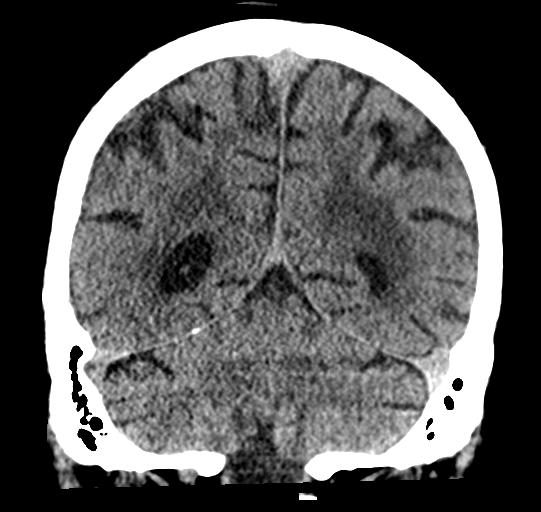
[im 33/75  brain]
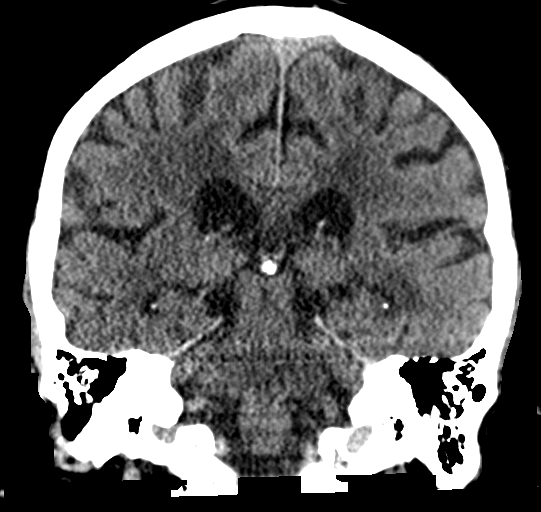
[im 42/75  brain]
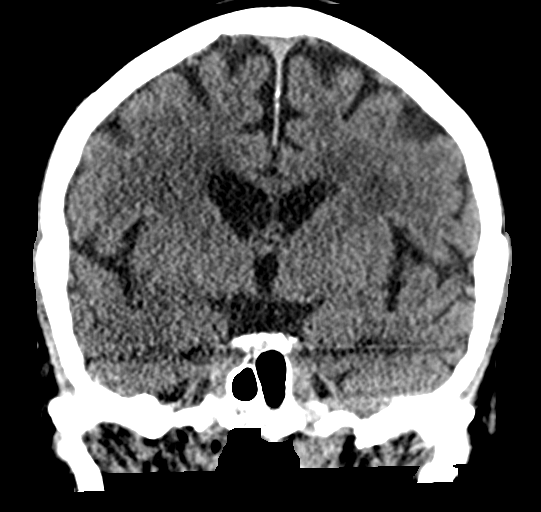

[Series 5: sagittal soft tissue · sagittal · 0.33mm/px · 3 of 57 slices shown]
[im 19/57  brain]
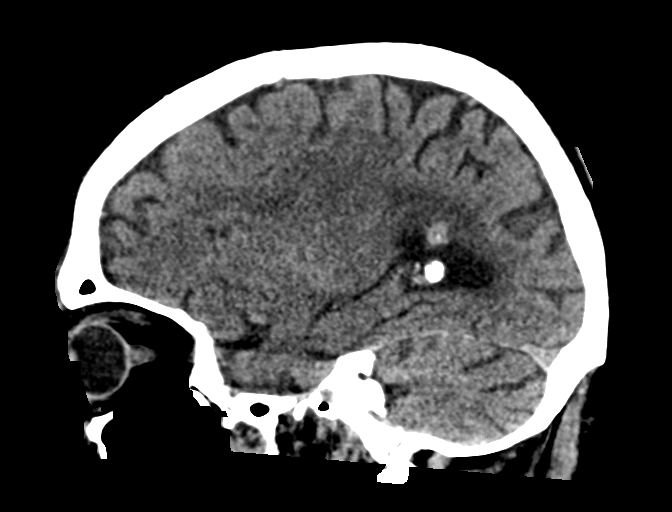
[im 29/57  brain]
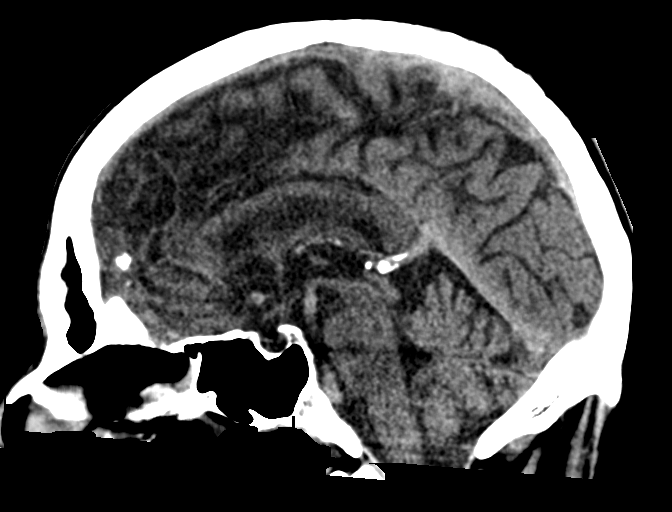
[im 38/57  brain]
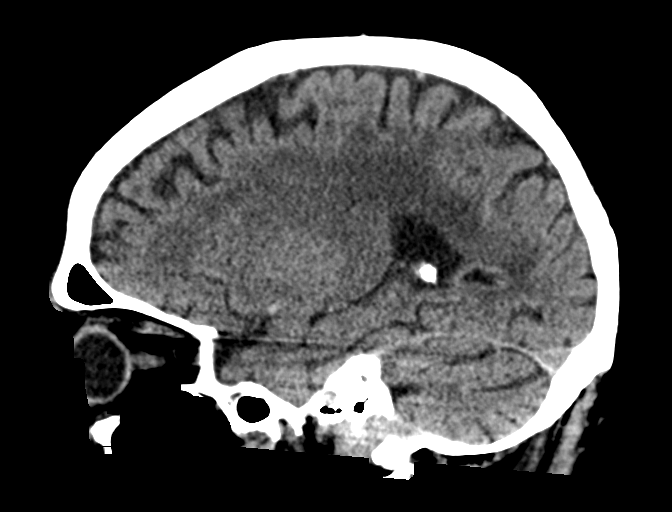

[16 of 47 positions shown; findings below may reference images not displayed]

FINDINGS: Brain: 9 mm high-density hemorrhage in the sub appended mole right
hemisphere, adjacent to the atrium of the right lateral ventricle.
Background of periventricular low-density which is symmetric and
attributed to small vessel disease. Brain volume loss especially
affecting the cerebellum. No hydrocephalus or visible infarct.

Vascular: No hyperdense vessel or unexpected calcification.

Skull: Normal. Negative for fracture or focal lesion.

Sinuses/Orbits: No acute finding.

Critical Value/emergent results were called by telephone at the time
of interpretation on [DATE] at [DATE] to provider CHAI ,
who verbally acknowledged these results.
IMPRESSION: 1. 9 mm subependymal hemorrhage at the atrium of the right lateral
ventricle, somewhat atypical for a trauma indication. Recommend
imaging follow-up to document clearing.
2. Small vessel disease and cerebral atrophy.

## 2021-08-15 IMAGING — CR DG PELVIS 1-2V
1 series · 1 of 1 positions shown · non-contrast
Comparison: None.

CLINICAL DATA: Fall with back pain

EXAM:
PELVIS - 1-2 VIEW

[pelvis ap]
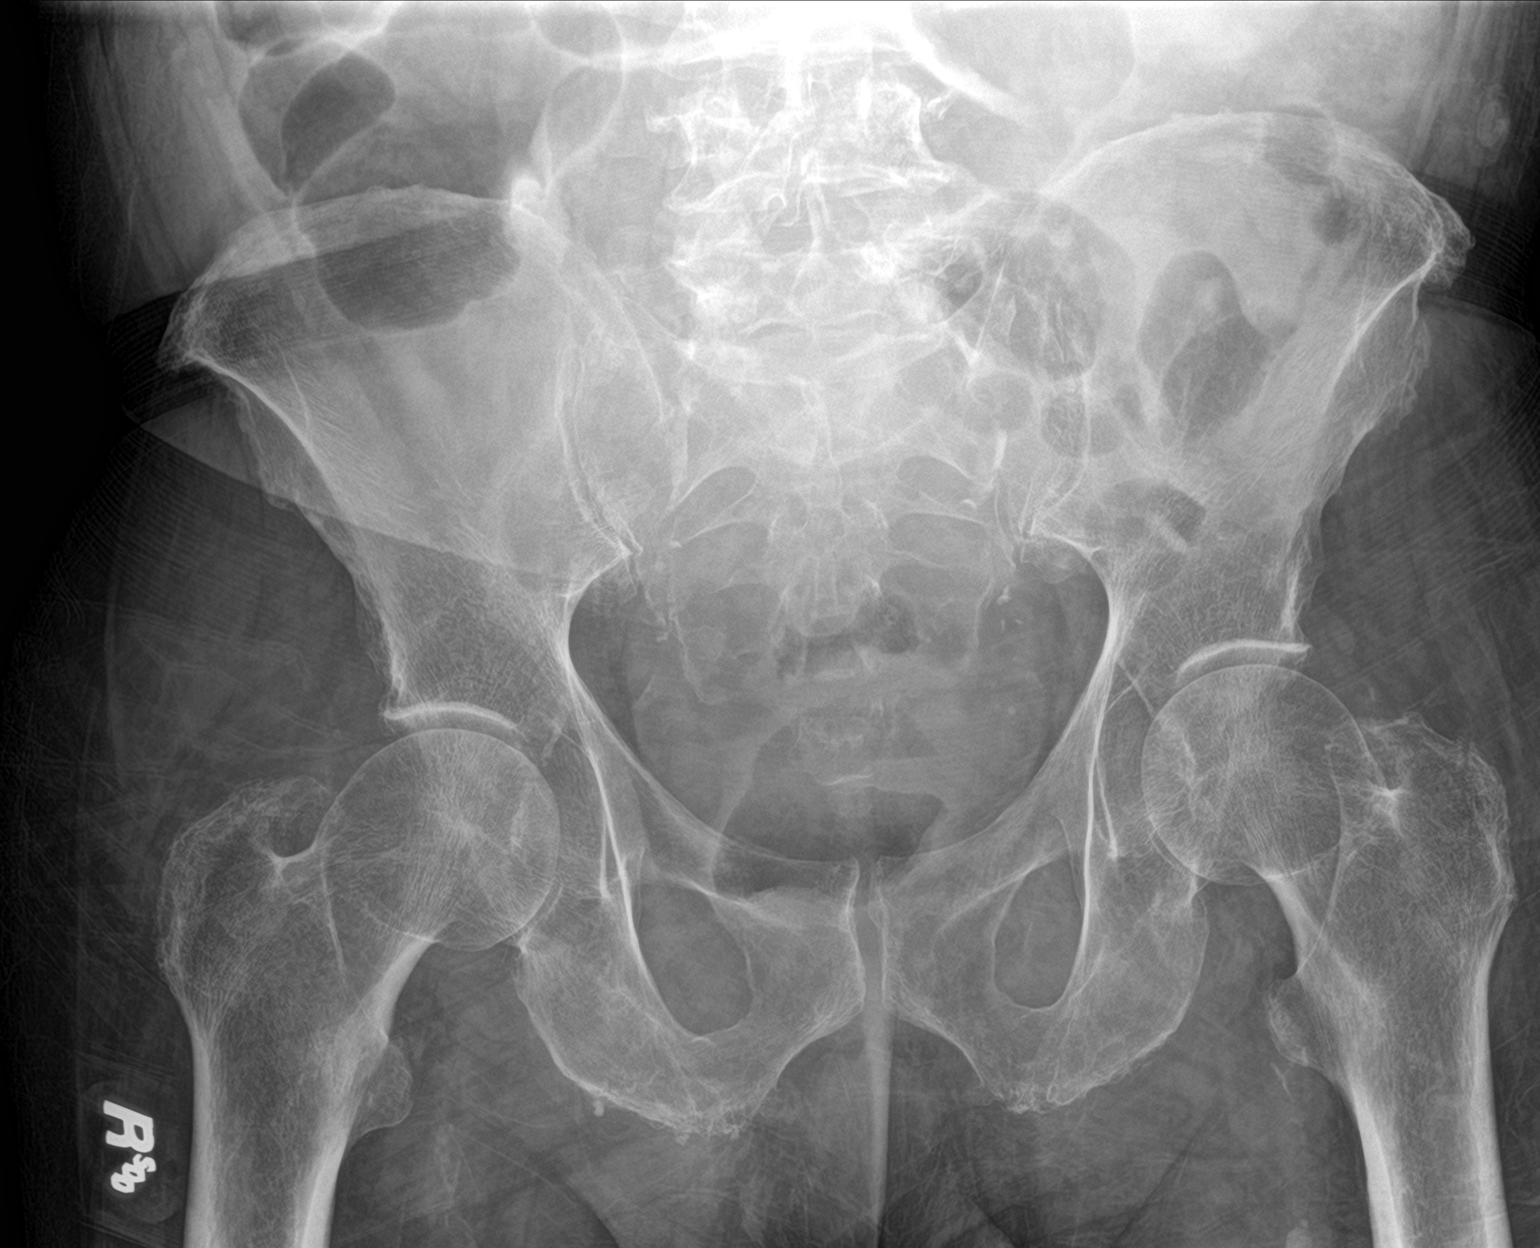

[1 of 1 positions shown; findings below may reference images not displayed]

FINDINGS: No evidence of pelvic ring fracture or diastasis. Both hips are
located and appear intact. Lower lumbar spine findings described on
dedicated study.
IMPRESSION: No evidence of pelvic ring fracture or diastasis.

## 2021-08-15 IMAGING — CR DG LUMBAR SPINE COMPLETE 4+V
6 series · 6 of 6 positions shown · non-contrast
Comparison: None.

CLINICAL DATA: Lower back pain

EXAM:
LUMBAR SPINE - COMPLETE 4+ VIEW

[l-spine ap]
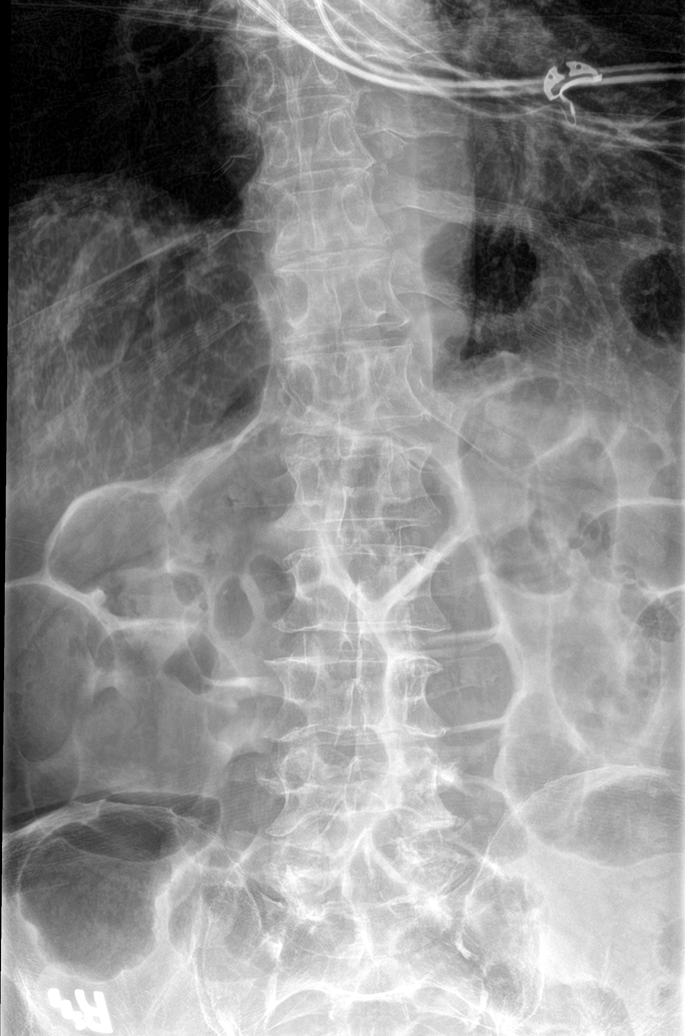

[l-spine obl (1 of 3)]
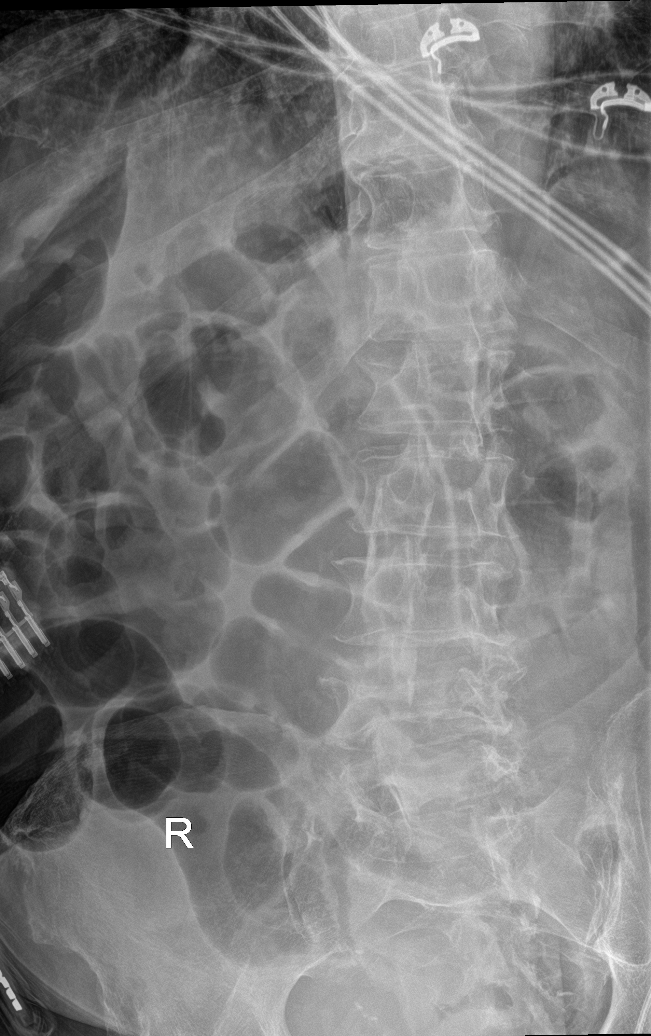

[l-spine obl (2 of 3)]
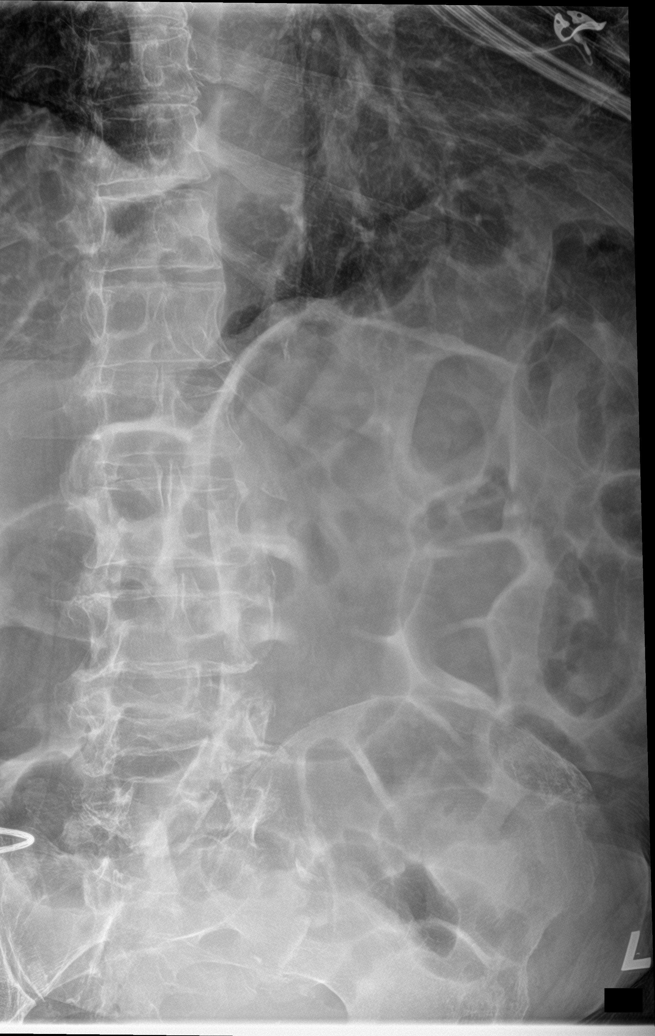

[l-spine spot]
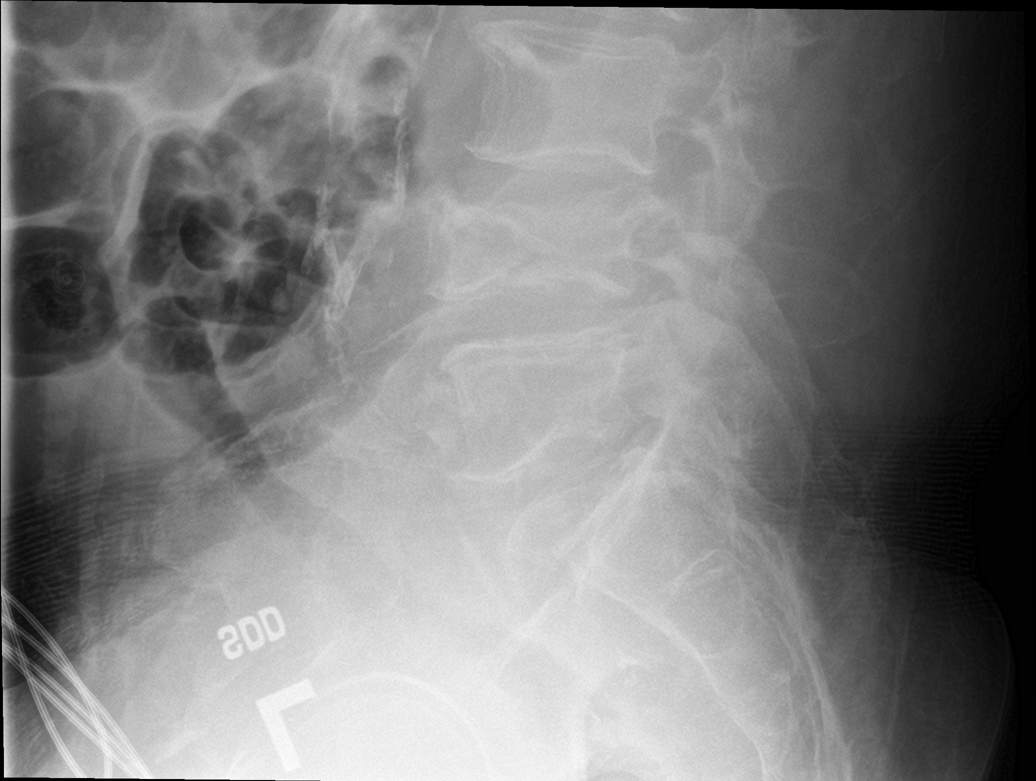

[l-spine lat]
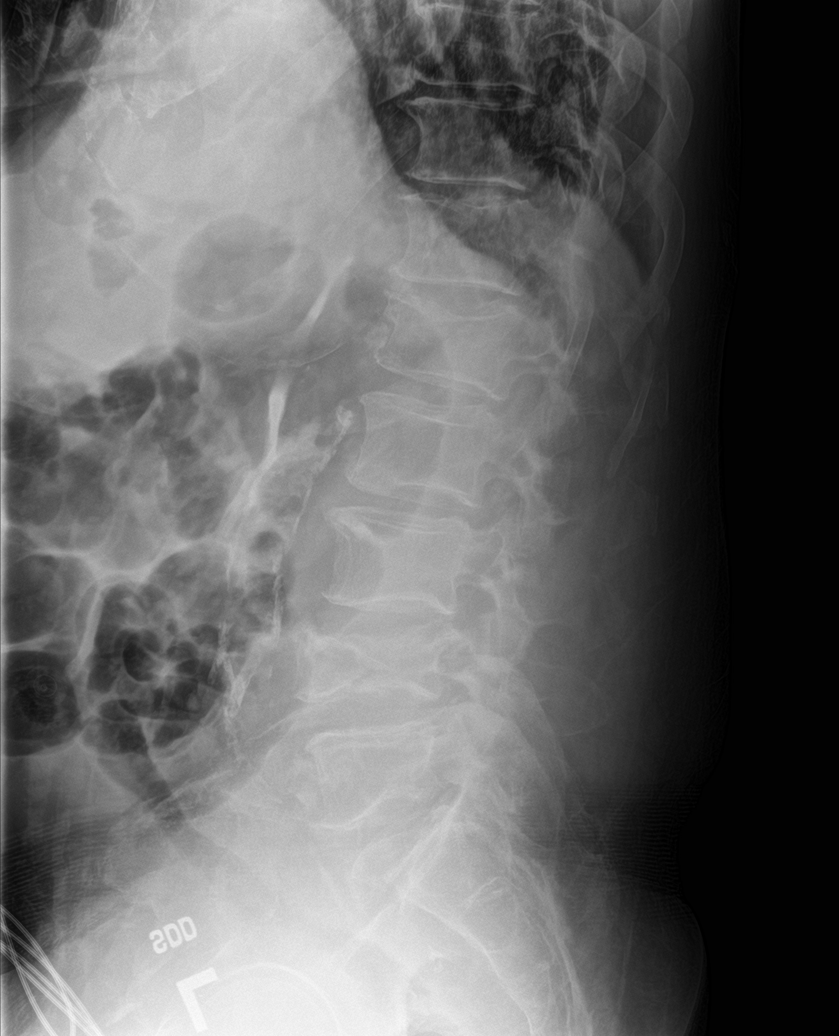

[l-spine obl (3 of 3)]
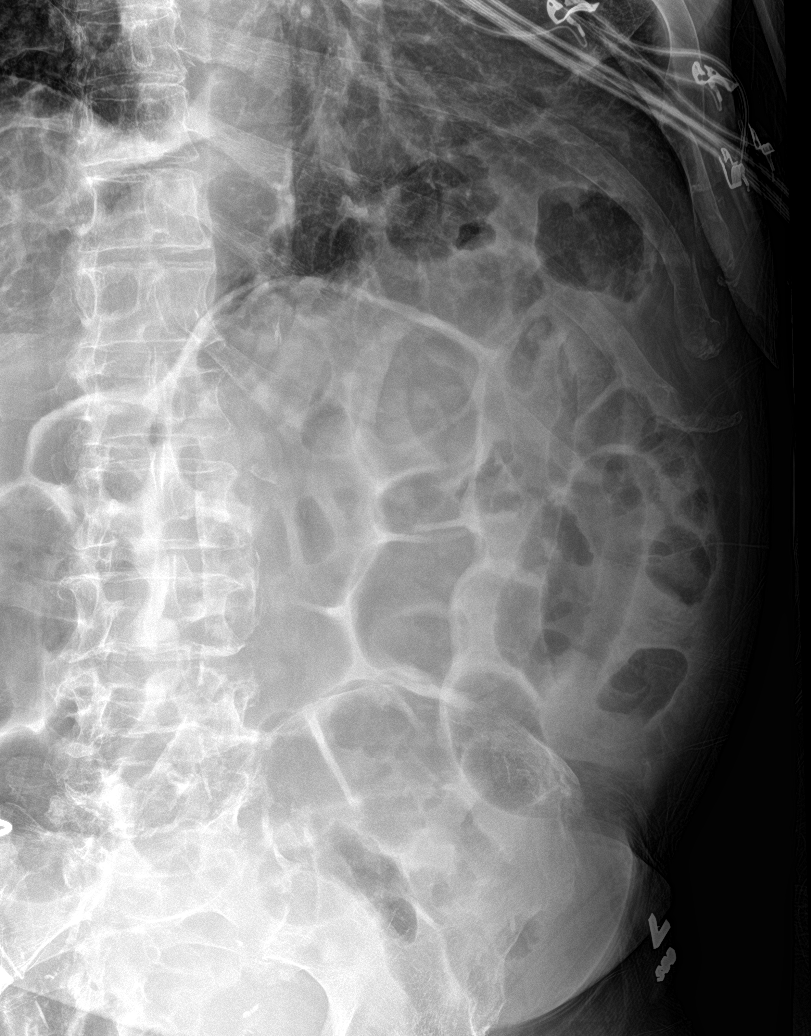

[6 of 6 positions shown; findings below may reference images not displayed]

FINDINGS: L1 and L4 compression fractures, both new from [JU]. Greater height
loss at L4 where there is 40% compression when compared to L3. Mild,
likely degenerative anterolisthesis at L5-S1. No evidence of
aggressive bone lesion.
IMPRESSION: L1 and L4 compression fractures, both new since [JU]. Chronicity is
indeterminate, suggest CT characterization.

## 2021-08-15 IMAGING — CT CT HEAD W/O CM
4 series · 17 of 47 positions shown, 19 images · non-contrast
Comparison: Earlier same day

CLINICAL DATA: Intracranial hemorrhage, follow-up



[Series 2: head wo · axial · 0.46mm/px · z∈[-88,+37]mm · 7 of 35 slices shown, 9 images]
[im 5/35  brain]
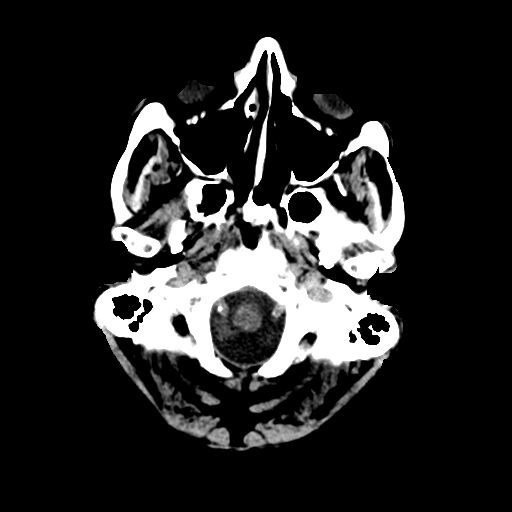
[im 5/35  bone]
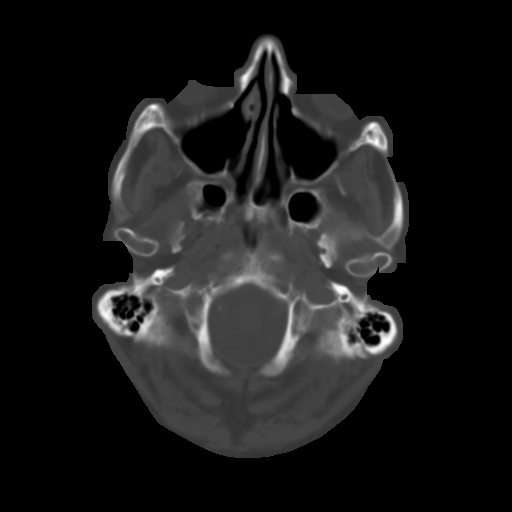
[im 9/35  brain]
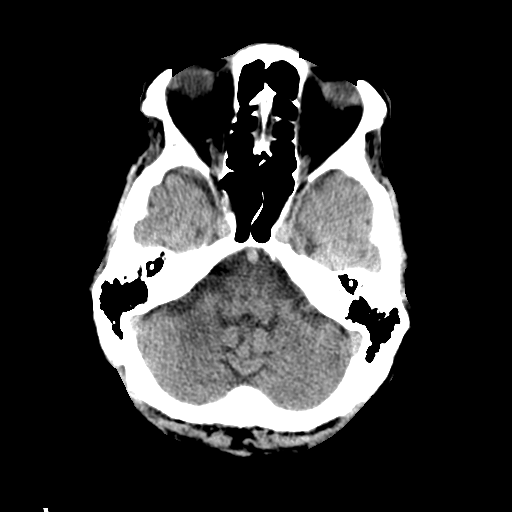
[im 13/35  brain]
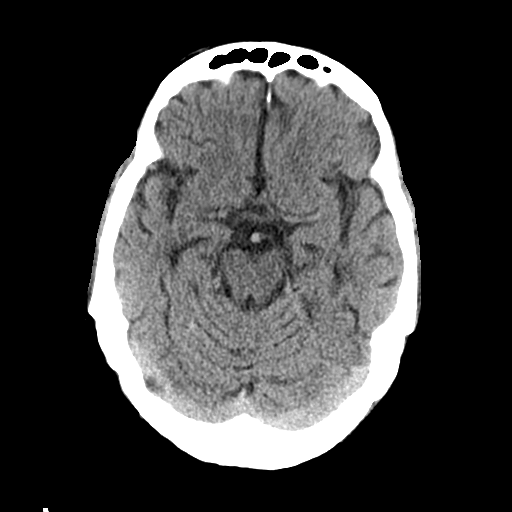
[im 18/35  brain]
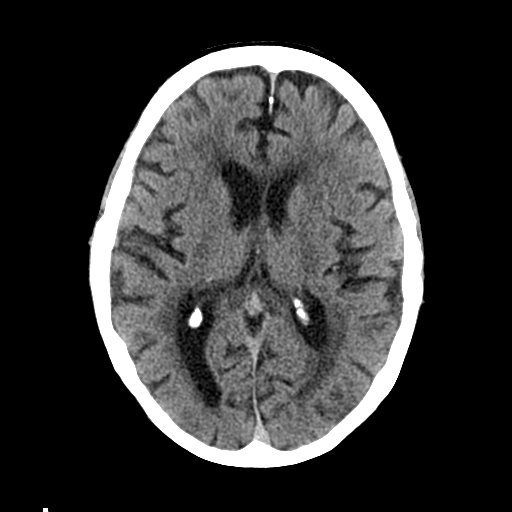
[im 22/35  brain]
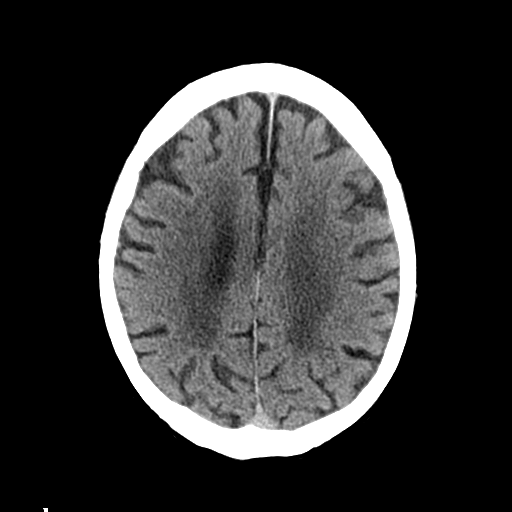
[im 22/35  bone]
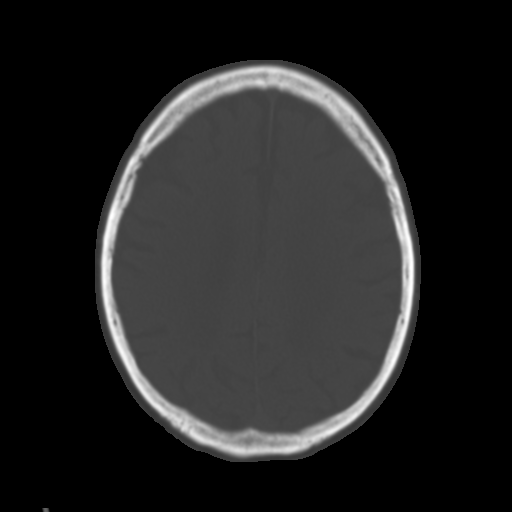
[im 26/35  brain]
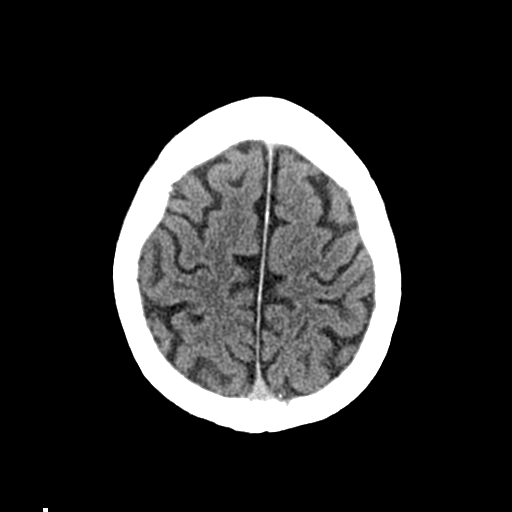
[im 30/35  brain]
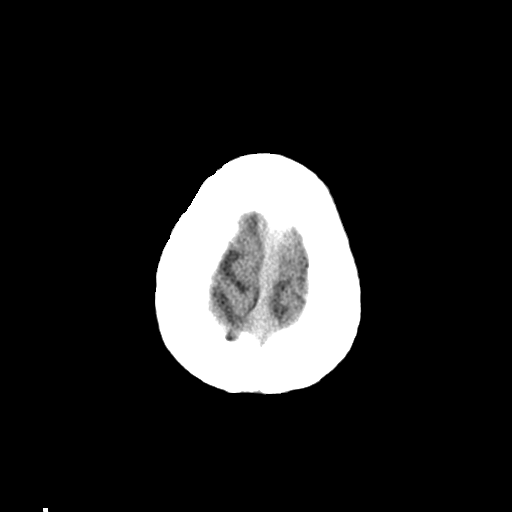

[Series 3: head bone · axial · 0.46mm/px · z∈[-92,-32]mm · 4 of 86 slices shown]
[im 9/86  bone]
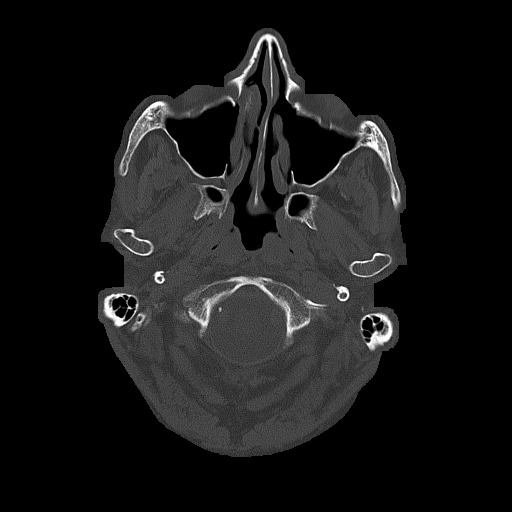
[im 18/86  bone]
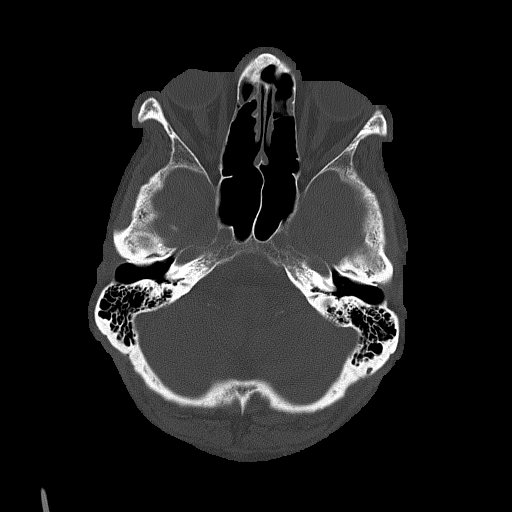
[im 26/86  bone]
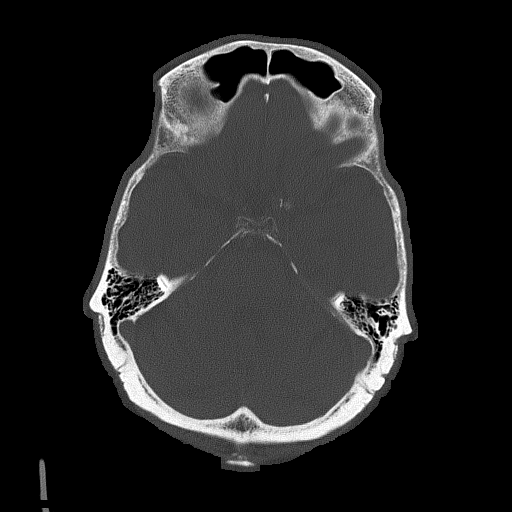
[im 39/86  bone]
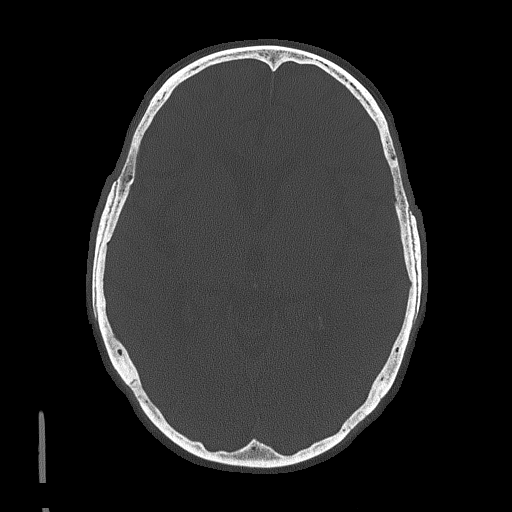

[Series 4: coronal soft tissue · coronal · 0.37mm/px · 3 of 71 slices shown]
[im 24/71  brain]
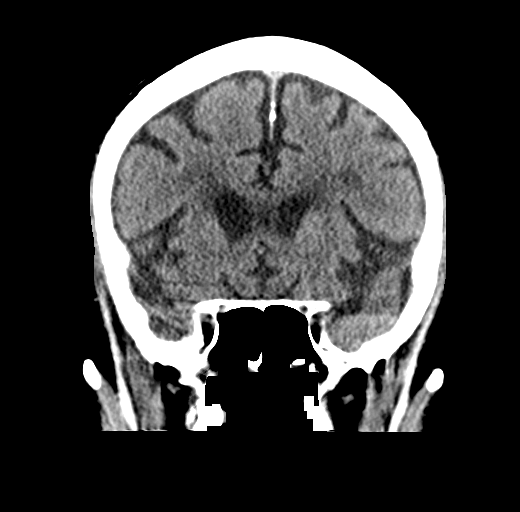
[im 32/71  brain]
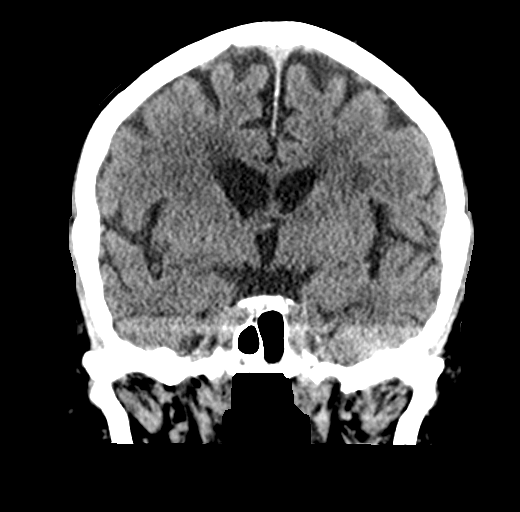
[im 39/71  brain]
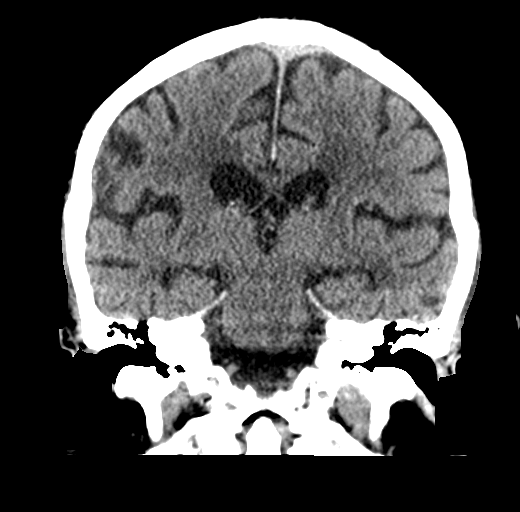

[Series 5: sagittal soft tissue · sagittal · 0.38mm/px · 3 of 60 slices shown]
[im 20/60  brain]
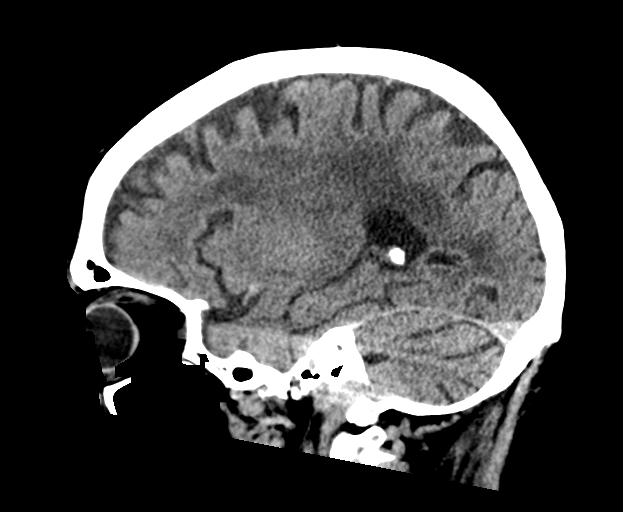
[im 30/60  brain]
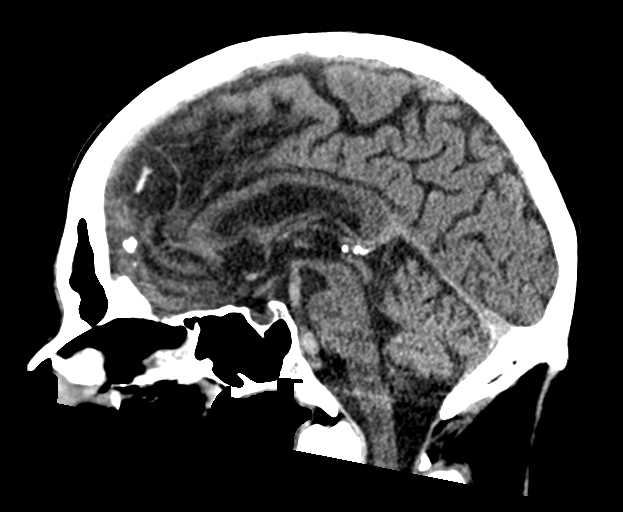
[im 40/60  brain]
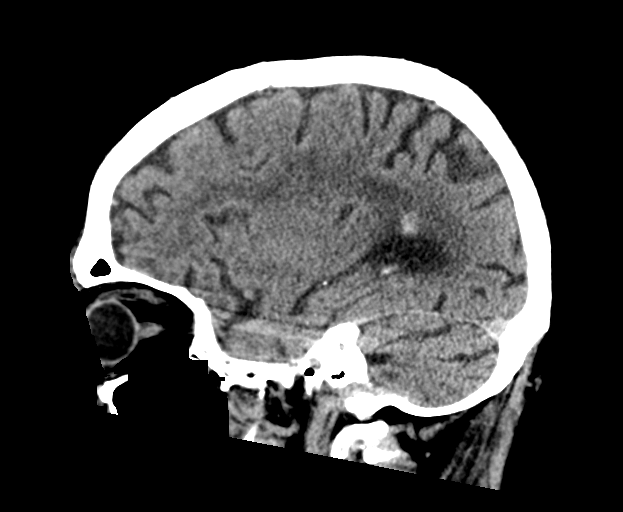

[17 of 47 positions shown; findings below may reference images not displayed]

FINDINGS: Brain: No substantial change in focus of hyperdensity along the
ependymal margin of the right lateral ventricle. No new hemorrhage.
Stable findings of chronic microvascular ischemic changes and
parenchymal volume loss. No new loss of gray-white differentiation.

Vascular: No new finding.

Skull: Calvarium is unremarkable.

Sinuses/Orbits: No acute finding.

Other: None.
IMPRESSION: Stable small focus of hyperdensity along the margin of the right
lateral ventricle. May reflect hemorrhage as previously suggested or
alternatively, an incidental lesion such as cavernous malformation.

## 2021-08-15 MED ORDER — ONDANSETRON HCL 4 MG/2ML IJ SOLN
4.0000 mg | Freq: Four times a day (QID) | INTRAMUSCULAR | Status: DC | PRN
  Administered 2021-08-17 – 2021-08-22 (×3): 4 mg via INTRAVENOUS
  Filled 2021-08-15 (×3): qty 2

## 2021-08-15 MED ORDER — SODIUM CHLORIDE 0.9 % IV SOLN: INTRAVENOUS | Status: DC

## 2021-08-15 MED ORDER — CLONAZEPAM 1 MG PO TABS
1.0000 mg | ORAL_TABLET | Freq: Two times a day (BID) | ORAL | Status: DC | PRN
  Administered 2021-08-15 – 2021-08-22 (×14): 1 mg via ORAL
  Filled 2021-08-15 (×7): qty 1
  Filled 2021-08-15: qty 2
  Filled 2021-08-15: qty 1
  Filled 2021-08-15: qty 2
  Filled 2021-08-15 (×4): qty 1

## 2021-08-15 MED ORDER — VITAMIN D (ERGOCALCIFEROL) 1.25 MG (50000 UNIT) PO CAPS: 50000.0000 [IU] | ORAL_CAPSULE | ORAL | Status: DC

## 2021-08-15 MED ORDER — SODIUM CHLORIDE 0.9 % IV BOLUS
1000.0000 mL | Freq: Once | INTRAVENOUS | Status: AC
  Administered 2021-08-15: 07:00:00 1000 mL via INTRAVENOUS

## 2021-08-15 MED ORDER — CLONAZEPAM 0.5 MG PO TABS: 1.0000 mg | ORAL_TABLET | Freq: Two times a day (BID) | ORAL | Status: DC | PRN

## 2021-08-15 MED ORDER — THIAMINE HCL 100 MG/ML IJ SOLN
Freq: Once | INTRAVENOUS | Status: AC
  Filled 2021-08-15: qty 1000

## 2021-08-15 MED ORDER — ONDANSETRON HCL 4 MG PO TABS: 4.0000 mg | ORAL_TABLET | Freq: Four times a day (QID) | ORAL | Status: DC | PRN

## 2021-08-15 MED ORDER — VITAMIN D (ERGOCALCIFEROL) 1.25 MG (50000 UNIT) PO CAPS
50000.0000 [IU] | ORAL_CAPSULE | ORAL | Status: DC
  Administered 2021-08-19: 50000 [IU] via ORAL
  Filled 2021-08-15: qty 1

## 2021-08-15 MED ORDER — ACETAMINOPHEN 650 MG RE SUPP: 650.0000 mg | Freq: Four times a day (QID) | RECTAL | Status: DC | PRN

## 2021-08-15 MED ORDER — MAGNESIUM HYDROXIDE 400 MG/5ML PO SUSP: 30.0000 mL | Freq: Every day | ORAL | Status: DC | PRN

## 2021-08-15 MED ORDER — ALLOPURINOL 300 MG PO TABS
300.0000 mg | ORAL_TABLET | Freq: Every day | ORAL | Status: DC
Start: 2021-08-15 — End: 2021-08-24
  Administered 2021-08-18 – 2021-08-24 (×3): 300 mg via ORAL
  Filled 2021-08-15 (×10): qty 1

## 2021-08-15 MED ORDER — TRAZODONE HCL 50 MG PO TABS
25.0000 mg | ORAL_TABLET | Freq: Every evening | ORAL | Status: DC | PRN
  Administered 2021-08-17 – 2021-08-23 (×5): 25 mg via ORAL
  Filled 2021-08-15 (×6): qty 1

## 2021-08-15 MED ORDER — ACETAMINOPHEN 325 MG PO TABS
650.0000 mg | ORAL_TABLET | Freq: Four times a day (QID) | ORAL | Status: DC | PRN
Start: 2021-08-15 — End: 2021-08-24
  Administered 2021-08-17 – 2021-08-22 (×14): 650 mg via ORAL
  Filled 2021-08-15 (×16): qty 2

## 2021-08-15 MED ORDER — LORAZEPAM 2 MG/ML IJ SOLN
0.0000 mg | Freq: Four times a day (QID) | INTRAMUSCULAR | Status: AC
  Administered 2021-08-15 (×3): 1 mg via INTRAVENOUS
  Administered 2021-08-16: 22:00:00 2 mg via INTRAVENOUS
  Filled 2021-08-15 (×4): qty 1

## 2021-08-15 MED ORDER — OXYCODONE HCL 5 MG PO TABS
10.0000 mg | ORAL_TABLET | Freq: Three times a day (TID) | ORAL | Status: DC | PRN
  Administered 2021-08-15 – 2021-08-22 (×23): 10 mg via ORAL
  Filled 2021-08-15 (×24): qty 2

## 2021-08-15 MED ORDER — THIAMINE HCL 100 MG PO TABS
100.0000 mg | ORAL_TABLET | Freq: Every day | ORAL | Status: DC
  Administered 2021-08-15 – 2021-08-18 (×4): 100 mg via ORAL
  Filled 2021-08-15 (×4): qty 1

## 2021-08-15 MED ORDER — OMEGA-3-ACID ETHYL ESTERS 1 G PO CAPS
ORAL_CAPSULE | Freq: Every day | ORAL | Status: DC
  Administered 2021-08-18 – 2021-08-24 (×5): 1 g via ORAL
  Filled 2021-08-15 (×8): qty 1

## 2021-08-15 NOTE — ED Notes (Signed)
Patient resting comfortably in stretcher. Call light within reach. Bed locked and lowered. No needs expressed to RN at this time.

## 2021-08-15 NOTE — ED Notes (Signed)
Patient at CT

## 2021-08-15 NOTE — ED Notes (Signed)
Dietary called for meal tray

## 2021-08-15 NOTE — ED Notes (Signed)
Patient eating lunch tray.

## 2021-08-15 NOTE — ED Notes (Signed)
Updated pt wife

## 2021-08-15 NOTE — ED Notes (Addendum)
Patient asking for medication for back pain. Patient offered tylenol- patient refused. Will give stronger pain medication when verified by pharmacy.

## 2021-08-15 NOTE — ED Notes (Signed)
Update given to patient's wife.

## 2021-08-15 NOTE — ED Notes (Signed)
Lab called for lab work

## 2021-08-15 NOTE — ED Notes (Signed)
Pharmacy sent message for banana bag

## 2021-08-15 NOTE — ED Provider Notes (Signed)
[facility] Provider Note    Event Date/Time   First MD Initiated Contact with Patient 08/15/21 [phone]     (approximate)   History   Fall   HPI  Level V caveat, limited by intoxication  Lance House is a 71 y.o. male brought to the ED via EMS from home with a chief complaint of intoxication, fall and lower back pain.  Patient is a heavy drinker, reports he usually drinks 12 pack beer per day.  Denies history of DTs.  Wife states he fell approximately 8 PM last night and she found him this morning in the same place.  Patient states he got up by crawling to a chair.  Complains of lower back pain affecting his ability to walk.  Does not know if he struck his head.  Denies chest pain, shortness of breath, abdominal pain, nausea, vomiting or dizziness.     Past Medical History   Past Medical History:  Diagnosis Date   Anxiety    Depression    Gout      Active Problem List   Patient Active Problem List   Diagnosis Date Noted   S/P inguinal hernia repair 04/13/2019   Incarcerated left inguinal hernia    Abdominal pain 04/08/2019   Hernia 08/13/2013   Tobacco use disorder 08/13/2013   Chronic sinusitis 02/26/2013   Dyspnea 02/26/2013   Anxiety 02/03/2013   Left flank pain 02/03/2013     Past Surgical History   Past Surgical History:  Procedure Laterality Date   APPENDECTOMY  1994   INGUINAL HERNIA REPAIR Left 04/13/2019   Procedure: HERNIA REPAIR INGUINAL ADULT OPEN, LEFT;  Surgeon: Fredirick Maudlin, MD;  Location: [facility] ORS;  Service: General;  Laterality: Left;   NASAL SINUS SURGERY  2015   unc     Home Medications   Prior to Admission medications   Medication Sig Start Date End Date Taking? Authorizing Provider  albuterol (VENTOLIN HFA) 108 (90 Base) MCG/ACT inhaler Albuterol Sulfate HFA 108 (90 Base) MCG/ACT Inhalation Aerosol Solution QTY: 18 gram Days: 30 Refills: 5  Written: 07/08/19 Patient Instructions: inhale 1-2 puffs every 4-6  hours for shortness of breath 07/08/19   [provider]  allopurinol (ZYLOPRIM) 300 MG tablet Take 300 mg by mouth daily. 05/16/20   [provider]  clonazePAM (KLONOPIN) 1 MG tablet Take 1 mg by mouth 2 (two) times daily as needed. 10/21/20   [provider]  ibuprofen (ADVIL) 800 MG tablet Take 1 tablet (800 mg total) by mouth every 8 (eight) hours as needed. Patient not taking: No sig reported 04/13/19   Fredirick Maudlin, MD  Omega-3 1000 MG CAPS Take by mouth daily.    [provider]  Oxycodone HCl 10 MG TABS Take 10 mg by mouth 3 (three) times daily as needed. 08/02/20   [provider]  Vitamin D, Ergocalciferol, (DRISDOL) 1.25 MG (50000 UNIT) CAPS capsule Take 1 capsule by mouth once a week. 09/21/20   [provider]     Allergies  Ciprocin-fluocin-procin [fluocinolone], Ciprofloxacin, and Duloxetine   Family History   Family History  Problem Relation Age of Onset   Skin cancer Father    Multiple sclerosis Sister    Non-Hodgkin's lymphoma Brother      Physical Exam  Triage Vital Signs: ED Triage Vitals [08/15/21 0537]  Enc Vitals Group     BP (!) 144/109     Pulse Rate 99     Resp 18  Temp 98.4 F (36.9 C)     Temp Source Oral     SpO2 97 %     Weight 190 lb (86.2 kg)     Height 6\' 1"  (1.854 m)     Head Circumference      Peak Flow      Pain Score 10     Pain Loc      Pain Edu?      Excl. in [place]?     Updated Vital Signs: BP (!) 143/97    Pulse 86    Temp 98.4 F (36.9 C) (Oral)    Resp 18    Ht 6\' 1"  (1.854 m)    Wt 86.2 kg    SpO2 97%    BMI 25.07 kg/m    General: Awake, disheveled in mild distress.  CV:  RRR.  Good peripheral perfusion.  Resp:  Normal effort.  CTAB. Abd:  Nontender.  No distention.  Hepatomegaly. Other:  Head and neck atraumatic.  No cervical spine tenderness to palpation.  Lumbar spine tender to palpation without step-offs or deformities.  Pelvis is stable.  2+ nonpitting BLE edema  and venous stasis ulcers.  Palpable distal pulses.  No jaundice.  No petechiae.  ED Results / Procedures / Treatments  Labs (all labs ordered are listed, but only abnormal results are displayed) Labs Reviewed  CBC WITH DIFFERENTIAL/PLATELET - Abnormal; Notable for the following components:      Result Value   WBC 14.8 (*)    MCHC 37.0 (*)    Neutro Abs 13.3 (*)    Lymphs Abs 0.4 (*)    Abs Immature Granulocytes 0.14 (*)    All other components within normal limits  URINALYSIS, ROUTINE W REFLEX MICROSCOPIC - Abnormal; Notable for the following components:   Hgb urine dipstick LARGE (*)    Ketones, ur 40 (*)    Protein, ur 30 (*)    All other components within normal limits  RESP PANEL BY RT-PCR (FLU A&B, COVID) ARPGX2  ETHANOL  URINALYSIS, MICROSCOPIC (REFLEX)  COMPREHENSIVE METABOLIC PANEL  LIPASE, BLOOD  URINE DRUG SCREEN, QUALITATIVE ([facility] ONLY)  CK  TROPONIN I (HIGH SENSITIVITY)     EKG  ED ECG REPORT I, Adithya Difrancesco J, the attending physician, personally viewed and interpreted this ECG.   Date: 08/15/2021  EKG Time: 0543  Rate: 93  Rhythm: normal sinus rhythm  Axis: Normal  Intervals:none  ST&T Change: Nonspecific    RADIOLOGY I have personally reviewed patient's CT head, cervical spine, chest, pelvis, lumbar spine x-rays as well as radiology interpretation:  CT head: Discussed with radiologist Dr. Pascal Lux -subependymal hemorrhage in right lateral ventricle  CT cervical spine: No acute traumatic injuries  Chest x-ray: No acute cardiopulmonary process  Pelvis x-ray: No acute fracture or dislocation  Lumbar spine x-ray: L1 and L4 compression fractures new from 2020  Repeat head CT is pending   Official radiology report(s): DG Chest 1 View  Result Date: 08/15/2021 CLINICAL DATA:  Fall with lower back pain EXAM: CHEST  1 VIEW COMPARISON:  None. FINDINGS: Interstitial coarsening favoring scar. No generalized congestive features. Normal heart size and aortic  contours. No visible fracture. IMPRESSION: No evidence of acute injury. Electronically Signed   By: Jorje Guild M.D.   On: 08/15/2021 06:37   DG Lumbar Spine Complete  Result Date: 08/15/2021 CLINICAL DATA:  Lower back pain EXAM: LUMBAR SPINE - COMPLETE 4+ VIEW COMPARISON:  None. FINDINGS: L1 and L4 compression fractures, both new from  2020. Greater height loss at L4 where there is 40% compression when compared to L3. Mild, likely degenerative anterolisthesis at L5-S1. No evidence of aggressive bone lesion. IMPRESSION: L1 and L4 compression fractures, both new since 2020. Chronicity is indeterminate, suggest CT characterization. Electronically Signed   By: Jorje Guild M.D.   On: 08/15/2021 06:38   DG Pelvis 1-2 Views  Result Date: 08/15/2021 CLINICAL DATA:  Fall with back pain EXAM: PELVIS - 1-2 VIEW COMPARISON:  None. FINDINGS: No evidence of pelvic ring fracture or diastasis. Both hips are located and appear intact. Lower lumbar spine findings described on dedicated study. IMPRESSION: No evidence of pelvic ring fracture or diastasis. Electronically Signed   By: Jorje Guild M.D.   On: 08/15/2021 06:39   CT Head Wo Contrast  Result Date: 08/15/2021 CLINICAL DATA:  Neck trauma.  Head trauma. EXAM: CT HEAD WITHOUT CONTRAST TECHNIQUE: Contiguous axial images were obtained from the base of the skull through the vertex without intravenous contrast. RADIATION DOSE REDUCTION: This exam was performed according to the departmental dose-optimization program which includes automated exposure control, adjustment of the mA and/or kV according to patient size and/or use of iterative reconstruction technique. COMPARISON:  None. FINDINGS: Brain: 9 mm high-density hemorrhage in the sub appended mole right hemisphere, adjacent to the atrium of the right lateral ventricle. Background of periventricular low-density which is symmetric and attributed to small vessel disease. Brain volume loss especially affecting  the cerebellum. No hydrocephalus or visible infarct. Vascular: No hyperdense vessel or unexpected calcification. Skull: Normal. Negative for fracture or focal lesion. Sinuses/Orbits: No acute finding. Critical Value/emergent results were called by telephone at the time of interpretation on 08/15/2021 at 6:16 am to provider Kynli Chou , who verbally acknowledged these results. IMPRESSION: 1. 9 mm subependymal hemorrhage at the atrium of the right lateral ventricle, somewhat atypical for a trauma indication. Recommend imaging follow-up to document clearing. 2. Small vessel disease and cerebral atrophy. Electronically Signed   By: Jorje Guild M.D.   On: 08/15/2021 06:16   CT Cervical Spine Wo Contrast  Result Date: 08/15/2021 CLINICAL DATA:  Trauma due to fall. EXAM: CT CERVICAL SPINE WITHOUT CONTRAST TECHNIQUE: Multidetector CT imaging of the cervical spine was performed without intravenous contrast. Multiplanar CT image reconstructions were also generated. RADIATION DOSE REDUCTION: This exam was performed according to the departmental dose-optimization program which includes automated exposure control, adjustment of the mA and/or kV according to patient size and/or use of iterative reconstruction technique. COMPARISON:  None. FINDINGS: Alignment: No traumatic malalignment Skull base and vertebrae: No acute fracture Soft tissues and spinal canal: No prevertebral fluid or swelling. No visible canal hematoma. Mild subcutaneous fat stranding at the left lateral neck base. Disc levels:  Ordinary degenerative changes in the cervical spine. Upper chest: No visible injury.  Mild paraseptal emphysema IMPRESSION: 1. Negative for cervical spine fracture or subluxation. 2. Mild soft tissue contusion at the left neck base. Electronically Signed   By: Jorje Guild M.D.   On: 08/15/2021 06:23     PROCEDURES:  Critical Care performed: Yes, see critical care procedure note(s)  CRITICAL CARE Performed by: Paulette Blanch   Total critical care time: 45 minutes  Critical care time was exclusive of separately billable procedures and treating other patients.  Critical care was necessary to treat or prevent imminent or life-threatening deterioration.  Critical care was time spent personally by me on the following activities: development of treatment plan with patient and/or surrogate as well as nursing,  discussions with consultants, evaluation of patient's response to treatment, examination of patient, obtaining history from patient or surrogate, ordering and performing treatments and interventions, ordering and review of laboratory studies, ordering and review of radiographic studies, pulse oximetry and re-evaluation of patient's condition.   [name]1-3 Lead EKG Interpretation Performed by: Paulette Blanch, MD Authorized by: Paulette Blanch, MD     Interpretation: normal     ECG rate:  99   ECG rate assessment: normal     Rhythm: sinus rhythm     Ectopy: none     Conduction: normal   Comments:     Patient placed on cardiac monitor to evaluate for arrhythmias   MEDICATIONS ORDERED IN ED: Medications  thiamine tablet 100 mg (has no administration in time range)  LORazepam (ATIVAN) injection 0-4 mg (1 mg Intravenous Given 08/15/21 0653)  sodium chloride 0.9 % 1,000 mL with thiamine 010 mg, folic acid 1 mg, M.V.I. Adult 10 mL infusion ( Intravenous New Bag/Given 08/15/21 0656)  sodium chloride 0.9 % bolus 1,000 mL (1,000 mLs Intravenous New Bag/Given 08/15/21 0653)     IMPRESSION / MDM / ASSESSMENT AND PLAN / ED COURSE  I reviewed the triage vital signs and the nursing notes.                             71 year old alcoholic presenting status post fall at 8 PM with lower back pain.  Differential diagnosis includes but is not limited to fracture, dislocation, metabolic derangement, etc.  I have personally reviewed patient's records and see a PCP office visit from 04/07/2021 for vitamin B12 deficiency.  The patient  is on the cardiac monitor to evaluate for evidence of arrhythmia and/or significant heart rate changes.  Will obtain basic and toxicological lab work, CT head and cervical spine, plain film x-rays of chest, pelvis and lumbar spine.  Place on CIWA protocol, administer banana bag.  Patient states he would be interested in detox if he is medically cleared.  Will reassess.   Clinical Course as of 08/15/21 0705  Tue Aug 15, 2021  0624 CT head result discussed with radiology Dr. Pascal Lux.  I consulted and spoke with Dr. Lacinda Axon from neurosurgery who recommends repeat CT head in 6 hours and to check coagulation studies.  Bleed likely from patient's dependent position since 8 PM and unlikely to progress. [JS]  [id number] CT cervical spine negative for acute traumatic injury.  Verbal report from lab indicates hyponatremia, sodium 112, elevated troponin 582, likely demand ischemia.  Patient denies chest pain or shortness of breath.  Will avoid anticoagulation given patient's head bleed. [JS]  0702 Chest x-ray and pelvis x-ray is unremarkable for acute traumatic injuries.  Lumbar spine series demonstrate L1 and L4 compression fractures, new since prior study. Care transferred at change of shift to Dr. Joni Fears pending repeat CT Head around 1215. If bleed has not expanded, patient will be admitted at that time. [JS]    Clinical Course User Index [JS] Paulette Blanch, MD     FINAL CLINICAL IMPRESSION(S) / ED DIAGNOSES   Final diagnoses:  Pain  Fall, initial encounter  Alcoholic intoxication without complication ([place])  Hyponatremia  Elevated troponin  Compression fracture of L1 vertebra, initial encounter ([place])  Compression fracture of L4 vertebra, initial encounter ([place])  Beer Potomania   Rx / DC Orders   ED Discharge Orders     None        Note:  This document was prepared using Dragon voice recognition software and may include unintentional dictation errors.   Paulette Blanch, MD 08/15/21 [phone]

## 2021-08-15 NOTE — ED Triage Notes (Signed)
Pt to ED via [facility] from home.  Pt fell at 2000 last night and is having lower back pain and is not able to walk.  Pt also wants detox from alcohol, states he usually drinks beer, approximately 12pk a day.  Pt states he fell because the floor was slick.

## 2021-08-15 NOTE — H&P (Addendum)
[place]   PATIENT NAME: Lance House    MR#:  [id number]  DATE OF BIRTH:  1950-09-05  DATE OF ADMISSION:  08/15/2021  PRIMARY CARE PHYSICIAN: [facility]   Patient is coming from: Home  REQUESTING/REFERRING PHYSICIAN: Brenton Grills, MD  CHIEF COMPLAINT:   Chief Complaint  Patient presents with   Fall    HISTORY OF PRESENT ILLNESS:  Lance House is a 71 y.o. Caucasian male with medical history significant for anxiety, depression and gout who presented to the emergency room with acute onset of generalized weakness and a fall in the kitchen in front of the refrigerator.  The patient stated that he lost balance but denied any presyncope or syncope.  No headache or dizziness or blurred vision or paresthesias.  No nausea or vomiting or abdominal pain.  No dysuria, oliguria or hematuria or flank pain.  He denies any dizziness or vertigo.  No chest pain or palpitations or dyspnea or cough or hemoptysis.  The patient admitted to drinking 8-10 beer per day and his last alcoholic drink was yesterday.  ED Course: Upon presentation to the ER blood pressure was 144/1 with otherwise normal vital signs.  Labs revealed hyponatremia 112 and hypochloremia 79 and albumin 3.3.  AST was 144 and ALT 48 with total bili of 2.2.  High-sensitivity troponin I was 532 and later 813 and CK was 5975.  Labs reveal leukocytosis of 14.8 with neutrophilia.  UA showed 40 ketones, 30 protein and alcohol levels less than 10.  Urine drug screen was positive for opiates. EKG as reviewed by me : EKG shows normal sinus rhythm with a rate of 93 with probable left atrial enlargement and borderline prolonged QT interval with QTC 484 MS. Imaging: Head and C-spine CT showed 9 mm subependymal hemorrhage at the atrium of the right lateral ventricle somewhat atypical for a trauma indication with recommendation for follow-up.  It showed small vessel disease and cerebral atrophy.  C-spine CT showed mild soft  tissue contusion at the left neck base with no spine fracture or subluxation. Repeat head CT scan 7 hours later revealed stable small focus of hyperdensity along the margin of the right lateral ventricle may reflect hemorrhage as previous suggested or alternatively an incidental lesion such as cavernous malformation.  The patient was given 2 L bolus of IV normal saline and was placed on CIWA protocol.  Dr. Lacinda Axon was notified about the patient and is aware.  He will be admitted to a PCU bed for further evaluation and management. PAST MEDICAL HISTORY:   Past Medical History:  Diagnosis Date   Anxiety    Depression    Gout     PAST SURGICAL HISTORY:   Past Surgical History:  Procedure Laterality Date   APPENDECTOMY  1994   INGUINAL HERNIA REPAIR Left 04/13/2019   Procedure: HERNIA REPAIR INGUINAL ADULT OPEN, LEFT;  Surgeon: Fredirick Maudlin, MD;  Location: [facility] ORS;  Service: General;  Laterality: Left;   NASAL SINUS SURGERY  2015   [facility]    SOCIAL HISTORY:   Social History   Tobacco Use   Smoking status: Every Day    Packs/day: 2.00    Types: Cigarettes   Smokeless tobacco: Never  Substance Use Topics   Alcohol use: Yes    Alcohol/week: 42.0 standard drinks    Types: 42 Cans of beer per week    FAMILY HISTORY:   Family History  Problem Relation Age of Onset   Skin cancer  Father    Multiple sclerosis Sister    Non-Hodgkin's lymphoma Brother     DRUG ALLERGIES:   Allergies  Allergen Reactions   Ciprocin-Fluocin-Procin [Fluocinolone] Anaphylaxis    Hip hurt   Ciprofloxacin    Colchicine    Duloxetine Diarrhea, Nausea And Vomiting and Other (See Comments)    Altered mental status    REVIEW OF SYSTEMS:   ROS As per history of present illness. All pertinent systems were reviewed above. Constitutional, HEENT, cardiovascular, respiratory, GI, GU, musculoskeletal, neuro, psychiatric, endocrine, integumentary and hematologic systems were reviewed and are otherwise  negative/unremarkable except for positive findings mentioned above in the HPI.   MEDICATIONS AT HOME:   Prior to Admission medications   Medication Sig Start Date End Date Taking? Authorizing Provider  allopurinol (ZYLOPRIM) 300 MG tablet Take 300 mg by mouth daily. 05/16/20  Yes [provider]  clonazePAM (KLONOPIN) 1 MG tablet Take 1 mg by mouth 2 (two) times daily as needed. 10/21/20  Yes [provider]  Omega-3 1000 MG CAPS Take by mouth daily.   Yes [provider]  Oxycodone HCl 10 MG TABS Take 10 mg by mouth 3 (three) times daily as needed. 08/02/20  Yes [provider]  albuterol (VENTOLIN HFA) 108 (90 Base) MCG/ACT inhaler Albuterol Sulfate HFA 108 (90 Base) MCG/ACT Inhalation Aerosol Solution QTY: 18 gram Days: 30 Refills: 5  Written: 07/08/19 Patient Instructions: inhale 1-2 puffs every 4-6 hours for shortness of breath Patient not taking: Reported on 08/15/2021 07/08/19   [provider]  ibuprofen (ADVIL) 800 MG tablet Take 1 tablet (800 mg total) by mouth every 8 (eight) hours as needed. Patient not taking: Reported on 10/28/2020 04/13/19   Fredirick Maudlin, MD  Vitamin D, Ergocalciferol, (DRISDOL) 1.25 MG (50000 UNIT) CAPS capsule Take 1 capsule by mouth once a week. 09/21/20   [provider]      VITAL SIGNS:  Blood pressure (!) 140/98, pulse 86, temperature 98.4 F (36.9 C), temperature source Oral, resp. rate 16, height 6\' 1"  (1.854 m), weight 86.2 kg, SpO2 91 %.  PHYSICAL EXAMINATION:  Physical Exam  GENERAL:  71 y.o.-year-old Caucasian male patient lying in the bed with no acute distress.  EYES: Pupils equal, round, reactive to light and accommodation. No scleral icterus. Extraocular muscles intact.  HEENT: Head atraumatic, normocephalic. Oropharynx and nasopharynx clear.  NECK:  Supple, no jugular venous distention. No thyroid enlargement, no tenderness.  LUNGS: Normal breath sounds bilaterally, no wheezing,  rales,rhonchi or crepitation. No use of accessory muscles of respiration.  CARDIOVASCULAR: Regular rate and rhythm, S1, S2 normal. No murmurs, rubs, or gallops.  ABDOMEN: Soft, nondistended, nontender. Bowel sounds present. No organomegaly or mass.  EXTREMITIES: No pedal edema, cyanosis, or clubbing.  NEUROLOGIC: Cranial nerves II through XII are intact. Muscle strength 5/5 in all extremities. Sensation intact. Gait not checked.  PSYCHIATRIC: The patient is alert and oriented x 3.  Normal affect and good eye contact. SKIN: No obvious rash, lesion, or ulcer.   LABORATORY PANEL:   CBC Recent Labs  Lab 08/15/21 0543  WBC 14.8*  HGB 15.3  HCT 41.3  PLT 181   ------------------------------------------------------------------------------------------------------------------  Chemistries  Recent Labs  Lab 08/15/21 0543  NA 112*  K 3.7  CL 79*  CO2 21*  GLUCOSE 92  BUN <5*  CREATININE 0.37*  CALCIUM 8.2*  AST 144*  ALT 48*  ALKPHOS 93  BILITOT 2.2*   ------------------------------------------------------------------------------------------------------------------  Cardiac Enzymes No results for input(s): TROPONINI in  the last 168 hours. ------------------------------------------------------------------------------------------------------------------  RADIOLOGY:  DG Chest 1 View  Result Date: 08/15/2021 CLINICAL DATA:  Fall with lower back pain EXAM: CHEST  1 VIEW COMPARISON:  None. FINDINGS: Interstitial coarsening favoring scar. No generalized congestive features. Normal heart size and aortic contours. No visible fracture. IMPRESSION: No evidence of acute injury. Electronically Signed   By: Jorje Guild M.D.   On: 08/15/2021 06:37   DG Lumbar Spine Complete  Result Date: 08/15/2021 CLINICAL DATA:  Lower back pain EXAM: LUMBAR SPINE - COMPLETE 4+ VIEW COMPARISON:  None. FINDINGS: L1 and L4 compression fractures, both new from 2020. Greater height loss at L4 where there is  40% compression when compared to L3. Mild, likely degenerative anterolisthesis at L5-S1. No evidence of aggressive bone lesion. IMPRESSION: L1 and L4 compression fractures, both new since 2020. Chronicity is indeterminate, suggest CT characterization. Electronically Signed   By: Jorje Guild M.D.   On: 08/15/2021 06:38   DG Pelvis 1-2 Views  Result Date: 08/15/2021 CLINICAL DATA:  Fall with back pain EXAM: PELVIS - 1-2 VIEW COMPARISON:  None. FINDINGS: No evidence of pelvic ring fracture or diastasis. Both hips are located and appear intact. Lower lumbar spine findings described on dedicated study. IMPRESSION: No evidence of pelvic ring fracture or diastasis. Electronically Signed   By: Jorje Guild M.D.   On: 08/15/2021 06:39   CT Head Wo Contrast  Result Date: 08/15/2021 CLINICAL DATA:  Intracranial hemorrhage, follow-up EXAM: CT HEAD WITHOUT CONTRAST TECHNIQUE: Contiguous axial images were obtained from the base of the skull through the vertex without intravenous contrast. RADIATION DOSE REDUCTION: This exam was performed according to the departmental dose-optimization program which includes automated exposure control, adjustment of the mA and/or kV according to patient size and/or use of iterative reconstruction technique. COMPARISON:  Earlier same day FINDINGS: Brain: No substantial change in focus of hyperdensity along the ependymal margin of the right lateral ventricle. No new hemorrhage. Stable findings of chronic microvascular ischemic changes and parenchymal volume loss. No new loss of gray-white differentiation. Vascular: No new finding. Skull: Calvarium is unremarkable. Sinuses/Orbits: No acute finding. Other: None. IMPRESSION: Stable small focus of hyperdensity along the margin of the right lateral ventricle. May reflect hemorrhage as previously suggested or alternatively, an incidental lesion such as cavernous malformation. Electronically Signed   By: Macy Mis M.D.   On: 08/15/2021  12:29   CT Head Wo Contrast  Result Date: 08/15/2021 CLINICAL DATA:  Neck trauma.  Head trauma. EXAM: CT HEAD WITHOUT CONTRAST TECHNIQUE: Contiguous axial images were obtained from the base of the skull through the vertex without intravenous contrast. RADIATION DOSE REDUCTION: This exam was performed according to the departmental dose-optimization program which includes automated exposure control, adjustment of the mA and/or kV according to patient size and/or use of iterative reconstruction technique. COMPARISON:  None. FINDINGS: Brain: 9 mm high-density hemorrhage in the sub appended mole right hemisphere, adjacent to the atrium of the right lateral ventricle. Background of periventricular low-density which is symmetric and attributed to small vessel disease. Brain volume loss especially affecting the cerebellum. No hydrocephalus or visible infarct. Vascular: No hyperdense vessel or unexpected calcification. Skull: Normal. Negative for fracture or focal lesion. Sinuses/Orbits: No acute finding. Critical Value/emergent results were called by telephone at the time of interpretation on 08/15/2021 at 6:16 am to provider JADE SUNG , who verbally acknowledged these results. IMPRESSION: 1. 9 mm subependymal hemorrhage at the atrium of the right lateral ventricle, somewhat atypical for a trauma indication.  Recommend imaging follow-up to document clearing. 2. Small vessel disease and cerebral atrophy. Electronically Signed   By: Jorje Guild M.D.   On: 08/15/2021 06:16   CT Cervical Spine Wo Contrast  Result Date: 08/15/2021 CLINICAL DATA:  Trauma due to fall. EXAM: CT CERVICAL SPINE WITHOUT CONTRAST TECHNIQUE: Multidetector CT imaging of the cervical spine was performed without intravenous contrast. Multiplanar CT image reconstructions were also generated. RADIATION DOSE REDUCTION: This exam was performed according to the departmental dose-optimization program which includes automated exposure control,  adjustment of the mA and/or kV according to patient size and/or use of iterative reconstruction technique. COMPARISON:  None. FINDINGS: Alignment: No traumatic malalignment Skull base and vertebrae: No acute fracture Soft tissues and spinal canal: No prevertebral fluid or swelling. No visible canal hematoma. Mild subcutaneous fat stranding at the left lateral neck base. Disc levels:  Ordinary degenerative changes in the cervical spine. Upper chest: No visible injury.  Mild paraseptal emphysema IMPRESSION: 1. Negative for cervical spine fracture or subluxation. 2. Mild soft tissue contusion at the left neck base. Electronically Signed   By: Jorje Guild M.D.   On: 08/15/2021 06:23      IMPRESSION AND PLAN:  Principal Problem:   ICH (intracerebral hemorrhage) ([place])  1.  Intracranial hemorrhage along the right lateral ventricle, currently stable, secondary to mechanical fall with associated rhabdomyolysis with elevated CK and troponin I.. - The patient will be admitted to a PCU bed. - We will follow neurochecks. - Aggressive BP control. - Neurosurgery consult to be obtained. - Dr. Lacinda Axon was notified and is aware about the patient. - We will follow serial CK and troponin I's with hydration.  2.  Symptomatic hyponatremia likely contributing to his fall.  This could be related to beer [place] mania. - The patient will be hydrated with IV normal saline. - Hyponatremia work-up will be obtained. - Fluid restrictions will be applied. - Serial sodium levels will be obtained. - PT consult to be obtained.  3.  Uncontrolled hypertension with hypertensive urgency. - The patient will be placed on as needed IV labetalol.  4.  Anxiety. - We will continue his Klonopin.  5.  Gout. - We will continue his allopurinol.  DVT prophylaxis: SCDs.  Medical prophylaxis currently contraindicated due to [place]. Code Status: full code. Family Communication:  The plan of care was discussed in details with the patient  (and family). I answered all questions. The patient agreed to proceed with the above mentioned plan. Further management will depend upon hospital course. Disposition Plan: Back to previous home environment Consults called: Neurosurgery.   All the records are reviewed and case discussed with ED provider.  Status is: Inpatient   At the time of the admission, it appears that the appropriate admission status for this patient is inpatient.  This is judged to be reasonable and necessary in order to provide the required intensity of service to ensure the patient's safety given the presenting symptoms, physical exam findings and initial radiographic and laboratory data in the context of comorbid conditions.  The patient requires inpatient status due to high intensity of service, high risk of further deterioration and high frequency of surveillance required.  I certify that at the time of admission, it is my clinical judgment that the patient will require inpatient hospital care extending more than 2 midnights.                            Dispo:  The patient is from: Home              Anticipated d/c is to: Home              Patient currently is not medically stable to d/c.              Difficult to place patient: No  Christel Mormon M.D on 08/15/2021 at 2:23 PM  [facility]   From 7 PM-7 AM, contact night-coverage [email or web address]  CC: Primary care physician; [facility] [place]

## 2021-08-16 DIAGNOSIS — E871 Hypo-osmolality and hyponatremia: Secondary | ICD-10-CM

## 2021-08-16 DIAGNOSIS — M6282 Rhabdomyolysis: Secondary | ICD-10-CM

## 2021-08-16 DIAGNOSIS — E876 Hypokalemia: Secondary | ICD-10-CM

## 2021-08-16 HISTORY — DX: Hypo-osmolality and hyponatremia: E87.1

## 2021-08-16 HISTORY — DX: Hypokalemia: E87.6

## 2021-08-16 HISTORY — DX: Rhabdomyolysis: M62.82

## 2021-08-16 LAB — HIV ANTIBODY (ROUTINE TESTING W REFLEX): HIV Screen 4th Generation wRfx: NONREACTIVE

## 2021-08-16 LAB — CK: Total CK: 1355 U/L — ABNORMAL HIGH (ref 49–397)

## 2021-08-16 LAB — BASIC METABOLIC PANEL
Anion gap: 11 (ref 5–15)
BUN: <5 mg/dL — ABNORMAL LOW (ref 8–23)
CO2: 21 mmol/L — ABNORMAL LOW (ref 22–32)
Calcium: 7.8 mg/dL — ABNORMAL LOW (ref 8.9–10.3)
Chloride: 91 mmol/L — ABNORMAL LOW (ref 98–111)
Creatinine, Ser: 0.38 mg/dL — ABNORMAL LOW (ref 0.61–1.24)
GFR, Estimated: >60 mL/min (ref >60)
Glucose, Bld: 62 mg/dL — ABNORMAL LOW (ref 70–99)
Potassium: 2.9 mmol/L — ABNORMAL LOW (ref 3.5–5.1)
Sodium: 123 mmol/L — ABNORMAL LOW (ref 135–145)

## 2021-08-16 LAB — CBC
HCT: 39 % (ref 39.0–52.0)
Hemoglobin: 13.9 g/dL (ref 13.0–17.0)
MCH: 32.9 pg (ref 26.0–34.0)
MCHC: 35.6 g/dL (ref 30.0–36.0)
MCV: 92.4 fL (ref 80.0–100.0)
Platelets: 153 10*3/uL (ref 150–400)
RBC: 4.22 MIL/uL (ref 4.22–5.81)
RDW: 13.7 % (ref 11.5–15.5)
WBC: 7.8 10*3/uL (ref 4.0–10.5)
nRBC: 0 % (ref 0.0–0.2)

## 2021-08-16 LAB — SODIUM
Sodium: 124 mmol/L — ABNORMAL LOW (ref 135–145)
Sodium: 123 mmol/L — ABNORMAL LOW (ref 135–145)
Sodium: 123 mmol/L — ABNORMAL LOW (ref 135–145)
Sodium: 125 mmol/L — ABNORMAL LOW (ref 135–145)

## 2021-08-16 LAB — TROPONIN I (HIGH SENSITIVITY): Troponin I (High Sensitivity): 496 ng/L (ref <18)

## 2021-08-16 LAB — MAGNESIUM: Magnesium: 1.9 mg/dL (ref 1.7–2.4)

## 2021-08-16 MED ORDER — DEXTROSE 5 % IV SOLN: INTRAVENOUS | Status: AC

## 2021-08-16 MED ORDER — POTASSIUM CHLORIDE 10 MEQ/100ML IV SOLN
10.0000 meq | INTRAVENOUS | Status: AC
  Administered 2021-08-16 (×3): 10 meq via INTRAVENOUS
  Filled 2021-08-16 (×3): qty 100

## 2021-08-16 NOTE — ED Notes (Signed)
Pt received the breakfast tray.

## 2021-08-16 NOTE — Progress Notes (Signed)
Lab called to collect timed sodium.

## 2021-08-16 NOTE — Progress Notes (Signed)
PROGRESS NOTE    Shashank Kwasnik  WFU:[id number] DOB: 08/23/1950 DOA: 08/15/2021 PCP: [facility] [facility]   Chief complaint.  Fall. Brief Narrative:  Andre Swander is a 71 y.o. Caucasian male with medical history significant for anxiety, depression and gout who presented to the emergency room with acute onset of generalized weakness and a fall in the kitchen in front of the refrigerator.  Patient had a sodium of 112, CK of 5975, CT showed 9 mm intracranial hemorrhage.   Assessment & Plan:   Principal Problem:   ICH (intracerebral hemorrhage) (HCC) Active Problems:   Hyponatremia   Hypokalemia   Rhabdomyolysis  Intracranial hemorrhage. Most likely from fall.  Neurosurgery consult obtained, condition appears to be stable.  Patient is currently asymptomatic.  Severe symptomatic hyponatremia secondary to beer potomania. Hypokalemia. Patient received IV fluids last night for rhabdomyolysis.  Sodium level went up from 112 to 123.  Patient was given D5 water as sodium went up too fast.  We will continue sodium check every 4 hours.  Patient no longer is dehydrated, discontinue fluids after D5. Add magnesium to the lab today.  Rhabdomyolysis secondary to fall. Elevated troponin secondary to rhabdomyolysis. CK level much improved after initial fluids.  I will hold off fluids for now due to concerns of rapid correction of sodium level.  Hypertension urgency. Blood pressure is better, continue to follow.  Alcohol abuse. Patient drink 8-12 beers a day, will watch for withdrawal, starting CIWA protocol.   DVT prophylaxis: SCDs Code Status: full Family Communication:  Disposition Plan:    Status is: Inpatient  Remains inpatient appropriate because: Due to severity of disease.        I/O last 3 completed shifts: In: 1000 [I.V.:1000] Out: 500 [Urine:500] No intake/output data recorded.     Consultants:  Neurosurgery  Procedures: None  Antimicrobials:  None  Subjective: Patient doing better this morning, he has no confusion, no headaches. Denies any short of breath or cough. No fever or chills. Abdominal pain or nausea vomiting. No dysuria hematuria.  Objective: Vitals:   08/16/21 0915 08/16/21 1209 08/16/21 1210 08/16/21 1400  BP: 135/78 (!) 151/83 (!) 151/83 106/69  Pulse: (!) 103 90 85 77  Resp: 15 (!) 23  12  Temp:      TempSrc:      SpO2: 91% 98%  98%  Weight:      Height:       No intake or output data in the 24 hours ending 08/16/21 1407 Filed Weights   08/15/21 0537  Weight: 86.2 kg    Examination:  General exam: Appears calm and comfortable  Respiratory system: Clear to auscultation. Respiratory effort normal. Cardiovascular system: S1 & S2 heard, RRR. No JVD, murmurs, rubs, gallops or clicks. No pedal edema. Gastrointestinal system: Abdomen is nondistended, soft and nontender. No organomegaly or masses felt. Normal bowel sounds heard. Central nervous system: Alert and oriented. No focal neurological deficits. Extremities: Symmetric 5 x 5 power. Skin: No rashes, lesions or ulcers Psychiatry: Judgement and insight appear normal. Mood & affect appropriate.     Data Reviewed: I have personally reviewed following labs and imaging studies  CBC: Recent Labs  Lab 08/15/21 0543 08/16/21 0710  WBC 14.8* 7.8  NEUTROABS 13.3*  --   HGB 15.3 13.9  HCT 41.3 39.0  MCV 88.8 92.4  PLT 181 202   Basic Metabolic Panel: Recent Labs  Lab 08/15/21 0543 08/16/21 0710 08/16/21 0858  NA 112* 123* 124*  K 3.7 2.9*  --  CL 79* 91*  --   CO2 21* 21*  --   GLUCOSE 92 62*  --   BUN <5* <5*  --   CREATININE 0.37* 0.38*  --   CALCIUM 8.2* 7.8*  --    GFR: Estimated Creatinine Clearance: 99.9 mL/min (A) (by C-G formula based on SCr of 0.38 mg/dL (L)). Liver Function Tests: Recent Labs  Lab 08/15/21 0543  AST 144*  ALT 48*  ALKPHOS 93  BILITOT 2.2*  PROT 6.7  ALBUMIN 3.3*   Recent Labs  Lab 08/15/21 0543   LIPASE 24   No results for input(s): AMMONIA in the last 168 hours. Coagulation Profile: No results for input(s): INR, PROTIME in the last 168 hours. Cardiac Enzymes: Recent Labs  Lab 08/15/21 0543 08/15/21 1845 08/16/21 0710  CKTOTAL 5,975* 3,059* 1,355*   BNP (last 3 results) No results for input(s): PROBNP in the last 8760 hours. HbA1C: No results for input(s): HGBA1C in the last 72 hours. CBG: No results for input(s): GLUCAP in the last 168 hours. Lipid Profile: No results for input(s): CHOL, HDL, LDLCALC, TRIG, CHOLHDL, LDLDIRECT in the last 72 hours. Thyroid Function Tests: Recent Labs    08/15/21 1845  TSH 1.319   Anemia Panel: No results for input(s): VITAMINB12, FOLATE, FERRITIN, TIBC, IRON, RETICCTPCT in the last 72 hours. Sepsis Labs: No results for input(s): PROCALCITON, LATICACIDVEN in the last 168 hours.  Recent Results (from the past 240 hour(s))  Resp Panel by RT-PCR (Flu A&B, Covid) Nasopharyngeal Swab     Status: None   Collection Time: 08/15/21  5:43 AM   Specimen: Nasopharyngeal Swab; Nasopharyngeal(NP) swabs in vial transport medium  Result Value Ref Range Status   SARS Coronavirus 2 by RT PCR NEGATIVE NEGATIVE Final    Comment: (NOTE) SARS-CoV-2 target nucleic acids are NOT DETECTED.  The SARS-CoV-2 RNA is generally detectable in upper respiratory specimens during the acute phase of infection. The lowest concentration of SARS-CoV-2 viral copies this assay can detect is 138 copies/mL. A negative result does not preclude SARS-Cov-2 infection and should not be used as the sole basis for treatment or other patient management decisions. A negative result may occur with  improper specimen collection/handling, submission of specimen other than nasopharyngeal swab, presence of viral mutation(s) within the areas targeted by this assay, and inadequate number of viral copies(<138 copies/mL). A negative result must be combined with clinical observations,  patient history, and epidemiological information. The expected result is Negative.  Fact Sheet for Patients:  EntrepreneurPulse.com.au  Fact Sheet for Healthcare Providers:  IncredibleEmployment.be  This test is no t yet approved or cleared by the [place] FDA and  has been authorized for detection and/or diagnosis of SARS-CoV-2 by FDA under an Emergency Use Authorization (EUA). This EUA will remain  in effect (meaning this test can be used) for the duration of the COVID-19 declaration under Section 564(b)(1) of the Act, 21 U.S.C.section 360bbb-3(b)(1), unless the authorization is terminated  or revoked sooner.       Influenza A by PCR NEGATIVE NEGATIVE Final   Influenza B by PCR NEGATIVE NEGATIVE Final    Comment: (NOTE) The Xpert Xpress SARS-CoV-2/FLU/RSV plus assay is intended as an aid in the diagnosis of influenza from Nasopharyngeal swab specimens and should not be used as a sole basis for treatment. Nasal washings and aspirates are unacceptable for Xpert Xpress SARS-CoV-2/FLU/RSV testing.  Fact Sheet for Patients: EntrepreneurPulse.com.au  Fact Sheet for Healthcare Providers: IncredibleEmployment.be  This test is not yet approved or  cleared by the [place] and has been authorized for detection and/or diagnosis of SARS-CoV-2 by FDA under an Emergency Use Authorization (EUA). This EUA will remain in effect (meaning this test can be used) for the duration of the COVID-19 declaration under Section 564(b)(1) of the Act, 21 U.S.C. section 360bbb-3(b)(1), unless the authorization is terminated or revoked.  Performed at [facility], [address] [place], [place] [address]          Radiology Studies: DG Chest 1 View  Result Date: 08/15/2021 CLINICAL DATA:  Fall with lower back pain EXAM: CHEST  1 VIEW COMPARISON:  None. FINDINGS: Interstitial coarsening favoring scar.  No generalized congestive features. Normal heart size and aortic contours. No visible fracture. IMPRESSION: No evidence of acute injury. Electronically Signed   By: Jorje Guild M.D.   On: 08/15/2021 06:37   DG Lumbar Spine Complete  Result Date: 08/15/2021 CLINICAL DATA:  Lower back pain EXAM: LUMBAR SPINE - COMPLETE 4+ VIEW COMPARISON:  None. FINDINGS: L1 and L4 compression fractures, both new from 2020. Greater height loss at L4 where there is 40% compression when compared to L3. Mild, likely degenerative anterolisthesis at L5-S1. No evidence of aggressive bone lesion. IMPRESSION: L1 and L4 compression fractures, both new since 2020. Chronicity is indeterminate, suggest CT characterization. Electronically Signed   By: Jorje Guild M.D.   On: 08/15/2021 06:38   DG Pelvis 1-2 Views  Result Date: 08/15/2021 CLINICAL DATA:  Fall with back pain EXAM: PELVIS - 1-2 VIEW COMPARISON:  None. FINDINGS: No evidence of pelvic ring fracture or diastasis. Both hips are located and appear intact. Lower lumbar spine findings described on dedicated study. IMPRESSION: No evidence of pelvic ring fracture or diastasis. Electronically Signed   By: Jorje Guild M.D.   On: 08/15/2021 06:39   CT Head Wo Contrast  Result Date: 08/15/2021 CLINICAL DATA:  Intracranial hemorrhage, follow-up EXAM: CT HEAD WITHOUT CONTRAST TECHNIQUE: Contiguous axial images were obtained from the base of the skull through the vertex without intravenous contrast. RADIATION DOSE REDUCTION: This exam was performed according to the departmental dose-optimization program which includes automated exposure control, adjustment of the mA and/or kV according to patient size and/or use of iterative reconstruction technique. COMPARISON:  Earlier same day FINDINGS: Brain: No substantial change in focus of hyperdensity along the ependymal margin of the right lateral ventricle. No new hemorrhage. Stable findings of chronic microvascular ischemic changes  and parenchymal volume loss. No new loss of gray-white differentiation. Vascular: No new finding. Skull: Calvarium is unremarkable. Sinuses/Orbits: No acute finding. Other: None. IMPRESSION: Stable small focus of hyperdensity along the margin of the right lateral ventricle. May reflect hemorrhage as previously suggested or alternatively, an incidental lesion such as cavernous malformation. Electronically Signed   By: Macy Mis M.D.   On: 08/15/2021 12:29   CT Head Wo Contrast  Result Date: 08/15/2021 CLINICAL DATA:  Neck trauma.  Head trauma. EXAM: CT HEAD WITHOUT CONTRAST TECHNIQUE: Contiguous axial images were obtained from the base of the skull through the vertex without intravenous contrast. RADIATION DOSE REDUCTION: This exam was performed according to the departmental dose-optimization program which includes automated exposure control, adjustment of the mA and/or kV according to patient size and/or use of iterative reconstruction technique. COMPARISON:  None. FINDINGS: Brain: 9 mm high-density hemorrhage in the sub appended mole right hemisphere, adjacent to the atrium of the right lateral ventricle. Background of periventricular low-density which is symmetric and attributed to small vessel disease. Brain volume  loss especially affecting the cerebellum. No hydrocephalus or visible infarct. Vascular: No hyperdense vessel or unexpected calcification. Skull: Normal. Negative for fracture or focal lesion. Sinuses/Orbits: No acute finding. Critical Value/emergent results were called by telephone at the time of interpretation on 08/15/2021 at 6:16 am to provider JADE SUNG , who verbally acknowledged these results. IMPRESSION: 1. 9 mm subependymal hemorrhage at the atrium of the right lateral ventricle, somewhat atypical for a trauma indication. Recommend imaging follow-up to document clearing. 2. Small vessel disease and cerebral atrophy. Electronically Signed   By: Jorje Guild M.D.   On: 08/15/2021  06:16   CT Cervical Spine Wo Contrast  Result Date: 08/15/2021 CLINICAL DATA:  Trauma due to fall. EXAM: CT CERVICAL SPINE WITHOUT CONTRAST TECHNIQUE: Multidetector CT imaging of the cervical spine was performed without intravenous contrast. Multiplanar CT image reconstructions were also generated. RADIATION DOSE REDUCTION: This exam was performed according to the departmental dose-optimization program which includes automated exposure control, adjustment of the mA and/or kV according to patient size and/or use of iterative reconstruction technique. COMPARISON:  None. FINDINGS: Alignment: No traumatic malalignment Skull base and vertebrae: No acute fracture Soft tissues and spinal canal: No prevertebral fluid or swelling. No visible canal hematoma. Mild subcutaneous fat stranding at the left lateral neck base. Disc levels:  Ordinary degenerative changes in the cervical spine. Upper chest: No visible injury.  Mild paraseptal emphysema IMPRESSION: 1. Negative for cervical spine fracture or subluxation. 2. Mild soft tissue contusion at the left neck base. Electronically Signed   By: Jorje Guild M.D.   On: 08/15/2021 06:23        Scheduled Meds:  allopurinol  300 mg Oral Daily   LORazepam  0-4 mg Intravenous Q6H   omega-3 acid ethyl esters   Oral Daily   thiamine  100 mg Oral Daily   [START ON 08/19/2021] Vitamin D (Ergocalciferol)  50,000 Units Oral Weekly   Continuous Infusions:   LOS: 1 day    Time spent: 37 minutes    Sharen Hones, MD [facility]   To contact the attending provider between 7A-7P or the covering provider during after hours 7P-7A, please log into the web site [email or web address] and access using universal [place] password for that web site. If you do not have the password, please call the hospital operator.  08/16/2021, 2:07 PM

## 2021-08-16 NOTE — Progress Notes (Signed)
2nd call to lab to collect timed sodium.

## 2021-08-16 NOTE — ED Notes (Signed)
Patient eating lunch tray.

## 2021-08-16 NOTE — ED Notes (Signed)
Pt sleeping. 

## 2021-08-16 NOTE — ED Notes (Signed)
Pt gown soiled. This RN tried to change pt gown pt refused.

## 2021-08-17 LAB — CBC WITH DIFFERENTIAL/PLATELET
Abs Immature Granulocytes: 0.06 10*3/uL (ref 0.00–0.07)
Basophils Absolute: 0.1 10*3/uL (ref 0.0–0.1)
Basophils Relative: 1 %
Eosinophils Absolute: 0.1 10*3/uL (ref 0.0–0.5)
Eosinophils Relative: 1 %
HCT: 36.5 % — ABNORMAL LOW (ref 39.0–52.0)
Hemoglobin: 13.1 g/dL (ref 13.0–17.0)
Immature Granulocytes: 1 %
Lymphocytes Relative: 13 %
Lymphs Abs: 1.3 10*3/uL (ref 0.7–4.0)
MCH: 32.9 pg (ref 26.0–34.0)
MCHC: 35.9 g/dL (ref 30.0–36.0)
MCV: 91.7 fL (ref 80.0–100.0)
Monocytes Absolute: 1 10*3/uL (ref 0.1–1.0)
Monocytes Relative: 10 %
Neutro Abs: 7.5 10*3/uL (ref 1.7–7.7)
Neutrophils Relative %: 74 %
Platelets: 171 10*3/uL (ref 150–400)
RBC: 3.98 MIL/uL — ABNORMAL LOW (ref 4.22–5.81)
RDW: 13.7 % (ref 11.5–15.5)
WBC: 10 10*3/uL (ref 4.0–10.5)
nRBC: 0 % (ref 0.0–0.2)

## 2021-08-17 LAB — BASIC METABOLIC PANEL
Anion gap: 6 (ref 5–15)
BUN: <5 mg/dL — ABNORMAL LOW (ref 8–23)
CO2: 24 mmol/L (ref 22–32)
Calcium: 7.8 mg/dL — ABNORMAL LOW (ref 8.9–10.3)
Chloride: 94 mmol/L — ABNORMAL LOW (ref 98–111)
Creatinine, Ser: 0.4 mg/dL — ABNORMAL LOW (ref 0.61–1.24)
GFR, Estimated: >60 mL/min (ref >60)
Glucose, Bld: 96 mg/dL (ref 70–99)
Potassium: 3.1 mmol/L — ABNORMAL LOW (ref 3.5–5.1)
Sodium: 124 mmol/L — ABNORMAL LOW (ref 135–145)

## 2021-08-17 LAB — CK: Total CK: 1507 U/L — ABNORMAL HIGH (ref 49–397)

## 2021-08-17 LAB — SODIUM
Sodium: 127 mmol/L — ABNORMAL LOW (ref 135–145)
Sodium: 127 mmol/L — ABNORMAL LOW (ref 135–145)

## 2021-08-17 LAB — TROPONIN I (HIGH SENSITIVITY): Troponin I (High Sensitivity): 229 ng/L (ref <18)

## 2021-08-17 LAB — MAGNESIUM: Magnesium: 1.9 mg/dL (ref 1.7–2.4)

## 2021-08-17 LAB — PHOSPHORUS: Phosphorus: 2.6 mg/dL (ref 2.5–4.6)

## 2021-08-17 MED ORDER — LACTULOSE 10 GM/15ML PO SOLN
20.0000 g | Freq: Once | ORAL | Status: AC
  Administered 2021-08-17: 20 g via ORAL
  Filled 2021-08-17: qty 30

## 2021-08-17 MED ORDER — POTASSIUM CHLORIDE IN NACL 20-0.9 MEQ/L-% IV SOLN
INTRAVENOUS | Status: DC
  Filled 2021-08-17 (×13): qty 1000

## 2021-08-17 MED ORDER — SENNOSIDES-DOCUSATE SODIUM 8.6-50 MG PO TABS
2.0000 | ORAL_TABLET | Freq: Two times a day (BID) | ORAL | Status: DC
  Administered 2021-08-17 – 2021-08-21 (×6): 2 via ORAL
  Filled 2021-08-17 (×10): qty 2

## 2021-08-17 MED ORDER — POTASSIUM CHLORIDE 10 MEQ/100ML IV SOLN
10.0000 meq | INTRAVENOUS | Status: AC
  Administered 2021-08-17 (×2): 10 meq via INTRAVENOUS
  Filled 2021-08-17 (×2): qty 100

## 2021-08-17 NOTE — Progress Notes (Signed)
°  Transition of Care [facility]) Screening Note   Patient Details  Name: Lance House Date of Birth: 31-May-1951   Transition of [facility]) CM/SW Contact:    Alberteen Sam, LCSW Phone Number: 08/17/2021, 9:27 AM    Transition of Care [facility]) has reviewed patient and no TOC needs have been identified at this time. We will continue to monitor patient advancement through interdisciplinary progression rounds. If new patient transition needs arise, please place a TOC consult.  [place], [place]

## 2021-08-17 NOTE — Progress Notes (Signed)
PROGRESS NOTE    Lance House  OMV:[id number] DOB: Apr 07, 1951 DOA: 08/15/2021 PCP: [facility]   Follow-up on hyponatremia. Brief Narrative:  Lance House is a 71 y.o. Caucasian male with medical history significant for anxiety, depression and gout who presented to the emergency room with acute onset of generalized weakness and a fall in the kitchen in front of the refrigerator.  Patient had a sodium of 112, CK of 5975, CT showed 9 mm intracranial hemorrhage.   Assessment & Plan:   Principal Problem:   ICH (intracerebral hemorrhage) (HCC) Active Problems:   Hyponatremia   Hypokalemia   Rhabdomyolysis   Intracranial hemorrhage. Patient evaluated by neurosurgery, stable, no indication for surgery.  Severe symptomatic hyponatremia secondary to beer potomania. Hypokalemia. Rhabdomyolysis secondary to fall. Elevated troponin secondary to rhabdomyolysis. Sodium level gradually going up, currently at 127. CK level still elevated.  At this point, I will restart IV fluids with normal saline with added potassium.  Continue replete potassium.  I will recheck sodium level every 12 hours.   Hypertension urgency. Continue current treatment.  Alcohol abuse. No evidence of alcohol withdrawal, continue CIWA protocol. Magnesium level is normal     DVT prophylaxis: SCDs Code Status: full Family Communication:  Disposition Plan: Patient has significant weakness, will obtain PT/OT.  Patient may need nursing placement if her significant weakness still persist     Status is: Inpatient   Remains inpatient appropriate because: Due to severity of disease.   I/O last 3 completed shifts: In: 683.9 [P.O.:480; IV Piggyback:203.9] Out: 475 [Urine:475] Total I/O In: 240 [P.O.:240] Out: -      Consultants:  Neurosurgery  Procedures: None  Antimicrobials: None  Subjective: Patient is still complaining of significant back pain which is chronic in nature.  He has  signal weakness, he states that he cannot walk because of pain. Denies any abdominal pain or nausea vomiting P No dysuria hematuria  No fever or chills.  Objective: Vitals:   08/16/21 2353 08/17/21 0334 08/17/21 0804 08/17/21 1130  BP: (!) 153/98 (!) 141/84 122/88 133/83  Pulse: 94 95 96 95  Resp: 20 20 16 16   Temp: 98.5 F (36.9 C) 98.1 F (36.7 C) 98.7 F (37.1 C) 97.9 F (36.6 C)  TempSrc: Oral Oral Oral   SpO2: 96% 95% 92% 94%  Weight:      Height:        Intake/Output Summary (Last 24 hours) at 08/17/2021 1218 Last data filed at 08/17/2021 0950 Gross per 24 hour  Intake 923.94 ml  Output 475 ml  Net 448.94 ml   Filed Weights   08/15/21 0537  Weight: 86.2 kg    Examination:  General exam: Appears calm and comfortable  Respiratory system: Clear to auscultation. Respiratory effort normal. Cardiovascular system: S1 & S2 heard, RRR. No JVD, murmurs, rubs, gallops or clicks. No pedal edema. Gastrointestinal system: Abdomen is nondistended, soft and nontender. No organomegaly or masses felt. Normal bowel sounds heard. Central nervous system: Alert and oriented. No focal neurological deficits. Extremities: Symmetric 5 x 5 power. Skin: No rashes, lesions or ulcers Psychiatry: Judgement and insight appear normal. Mood & affect appropriate.     Data Reviewed: I have personally reviewed following labs and imaging studies  CBC: Recent Labs  Lab 08/15/21 0543 08/16/21 0710 08/17/21 0253  WBC 14.8* 7.8 10.0  NEUTROABS 13.3*  --  7.5  HGB 15.3 13.9 13.1  HCT 41.3 39.0 36.5*  MCV 88.8 92.4 91.7  PLT 181 153 171  Basic Metabolic Panel: Recent Labs  Lab 08/15/21 0543 08/16/21 0710 08/16/21 0858 08/16/21 1438 08/16/21 1942 08/16/21 2219 08/17/21 0253 08/17/21 0639  NA 112* 123* 124* 123* 123* 125* 124* 127*  K 3.7 2.9*  --   --   --   --  3.1*  --   CL 79* 91*  --   --   --   --  94*  --   CO2 21* 21*  --   --   --   --  24  --   GLUCOSE 92 62*  --   --    --   --  96  --   BUN <5* <5*  --   --   --   --  <5*  --   CREATININE 0.37* 0.38*  --   --   --   --  0.40*  --   CALCIUM 8.2* 7.8*  --   --   --   --  7.8*  --   MG  --   --  1.9  --   --   --  1.9  --   PHOS  --   --   --   --   --   --  2.6  --    GFR: Estimated Creatinine Clearance: 99.9 mL/min (A) (by C-G formula based on SCr of 0.4 mg/dL (L)). Liver Function Tests: Recent Labs  Lab 08/15/21 0543  AST 144*  ALT 48*  ALKPHOS 93  BILITOT 2.2*  PROT 6.7  ALBUMIN 3.3*   Recent Labs  Lab 08/15/21 0543  LIPASE 24   No results for input(s): AMMONIA in the last 168 hours. Coagulation Profile: No results for input(s): INR, PROTIME in the last 168 hours. Cardiac Enzymes: Recent Labs  Lab 08/15/21 0543 08/15/21 1845 08/16/21 0710 08/17/21 0253  CKTOTAL 5,975* 3,059* 1,355* 1,507*   BNP (last 3 results) No results for input(s): PROBNP in the last 8760 hours. HbA1C: No results for input(s): HGBA1C in the last 72 hours. CBG: No results for input(s): GLUCAP in the last 168 hours. Lipid Profile: No results for input(s): CHOL, HDL, LDLCALC, TRIG, CHOLHDL, LDLDIRECT in the last 72 hours. Thyroid Function Tests: Recent Labs    08/15/21 1845  TSH 1.319   Anemia Panel: No results for input(s): VITAMINB12, FOLATE, FERRITIN, TIBC, IRON, RETICCTPCT in the last 72 hours. Sepsis Labs: No results for input(s): PROCALCITON, LATICACIDVEN in the last 168 hours.  Recent Results (from the past 240 hour(s))  Resp Panel by RT-PCR (Flu A&B, Covid) Nasopharyngeal Swab     Status: None   Collection Time: 08/15/21  5:43 AM   Specimen: Nasopharyngeal Swab; Nasopharyngeal(NP) swabs in vial transport medium  Result Value Ref Range Status   SARS Coronavirus 2 by RT PCR NEGATIVE NEGATIVE Final    Comment: (NOTE) SARS-CoV-2 target nucleic acids are NOT DETECTED.  The SARS-CoV-2 RNA is generally detectable in upper respiratory specimens during the acute phase of infection. The  lowest concentration of SARS-CoV-2 viral copies this assay can detect is 138 copies/mL. A negative result does not preclude SARS-Cov-2 infection and should not be used as the sole basis for treatment or other patient management decisions. A negative result may occur with  improper specimen collection/handling, submission of specimen other than nasopharyngeal swab, presence of viral mutation(s) within the areas targeted by this assay, and inadequate number of viral copies(<138 copies/mL). A negative result must be combined with clinical observations, patient history, and epidemiological information. The  expected result is Negative.  Fact Sheet for Patients:  EntrepreneurPulse.com.au  Fact Sheet for Healthcare Providers:  IncredibleEmployment.be  This test is no t yet approved or cleared by the [place] FDA and  has been authorized for detection and/or diagnosis of SARS-CoV-2 by FDA under an Emergency Use Authorization (EUA). This EUA will remain  in effect (meaning this test can be used) for the duration of the COVID-19 declaration under Section 564(b)(1) of the Act, 21 U.S.C.section 360bbb-3(b)(1), unless the authorization is terminated  or revoked sooner.       Influenza A by PCR NEGATIVE NEGATIVE Final   Influenza B by PCR NEGATIVE NEGATIVE Final    Comment: (NOTE) The Xpert Xpress SARS-CoV-2/FLU/RSV plus assay is intended as an aid in the diagnosis of influenza from Nasopharyngeal swab specimens and should not be used as a sole basis for treatment. Nasal washings and aspirates are unacceptable for Xpert Xpress SARS-CoV-2/FLU/RSV testing.  Fact Sheet for Patients: EntrepreneurPulse.com.au  Fact Sheet for Healthcare Providers: IncredibleEmployment.be  This test is not yet approved or cleared by the [place] FDA and has been authorized for detection and/or diagnosis of SARS-CoV-2 by FDA under  an Emergency Use Authorization (EUA). This EUA will remain in effect (meaning this test can be used) for the duration of the COVID-19 declaration under Section 564(b)(1) of the Act, 21 U.S.C. section 360bbb-3(b)(1), unless the authorization is terminated or revoked.  Performed at [facility], [address], [place], [place] [address]          Radiology Studies: CT Head Wo Contrast  Result Date: 08/15/2021 CLINICAL DATA:  Intracranial hemorrhage, follow-up EXAM: CT HEAD WITHOUT CONTRAST TECHNIQUE: Contiguous axial images were obtained from the base of the skull through the vertex without intravenous contrast. RADIATION DOSE REDUCTION: This exam was performed according to the departmental dose-optimization program which includes automated exposure control, adjustment of the mA and/or kV according to patient size and/or use of iterative reconstruction technique. COMPARISON:  Earlier same day FINDINGS: Brain: No substantial change in focus of hyperdensity along the ependymal margin of the right lateral ventricle. No new hemorrhage. Stable findings of chronic microvascular ischemic changes and parenchymal volume loss. No new loss of gray-white differentiation. Vascular: No new finding. Skull: Calvarium is unremarkable. Sinuses/Orbits: No acute finding. Other: None. IMPRESSION: Stable small focus of hyperdensity along the margin of the right lateral ventricle. May reflect hemorrhage as previously suggested or alternatively, an incidental lesion such as cavernous malformation. Electronically Signed   By: Macy Mis M.D.   On: 08/15/2021 12:29        Scheduled Meds:  allopurinol  300 mg Oral Daily   omega-3 acid ethyl esters   Oral Daily   senna-docusate  2 tablet Oral BID   thiamine  100 mg Oral Daily   [START ON 08/19/2021] Vitamin D (Ergocalciferol)  50,000 Units Oral Weekly   Continuous Infusions:  0.9 % NaCl with KCl 20 mEq / L 100 mL/hr at 08/17/21 0925     LOS: 2 days     Time spent: 32 minutes    Sharen Hones, MD [facility]   To contact the attending provider between 7A-7P or the covering provider during after hours 7P-7A, please log into the web site [email or web address] and access using universal [place] password for that web site. If you do not have the password, please call the hospital operator.  08/17/2021, 12:18 PM

## 2021-08-17 NOTE — Evaluation (Signed)
Occupational Therapy Evaluation Patient Details Name: Lance House MRN: [id number] DOB: March 30, 1951 Today's Date: 08/17/2021   History of Present Illness Pt is a 71 yo male with who presented to the ED with acute onset of generalized weakness and a fall in the kitchen in front of the refrigerator.  Patient had a sodium of 112, CK of 5975, CT showed 9 mm intracranial hemorrhage. PmHx: anxiety, depression, gout, and chronic alcohol use.   Clinical Impression   Pt was seen for OT evaluation this date. Prior to hospital admission, pt was living with his wife in home with 6 STE, generally able to manage basic ADL without assist. Currently pt demonstrates impairments as described below (See OT problem list) which functionally limit his ability to perform ADL/self-care tasks. Pt currently requires MIN A for ADL transfers, MIN A for LB ADL tasks, increased time/effort for tasks 2/2 decreased strength, activity tolerance, balance, and pain in low back. Pt instructed in ADL transfers and RW mgt, strategies to minimize back pain during movement. Pt verbalized understanding, however, continued to require VC for technique/safety while transitioning from the recliner back to bed with the RW. Pt would benefit from skilled OT services to address noted impairments and functional limitations (see below for any additional details) in order to maximize safety and independence while minimizing falls risk and caregiver burden. Upon hospital discharge, recommend STR to maximize pt safety and return to PLOF.    Recommendations for follow up therapy are one component of a multi-disciplinary discharge planning process, led by the attending physician.  Recommendations may be updated based on patient status, additional functional criteria and insurance authorization.   Follow Up Recommendations  Skilled nursing-short term rehab (<3 hours/day)    Assistance Recommended at Discharge Frequent or constant Supervision/Assistance   Patient can return home with the following A lot of help with walking and/or transfers;A lot of help with bathing/dressing/bathroom;Help with stairs or ramp for entrance;Assist for transportation;Assistance with cooking/housework;Direct supervision/assist for medications management    Functional Status Assessment  Patient has had a recent decline in their functional status and demonstrates the ability to make significant improvements in function in a reasonable and predictable amount of time.  Equipment Recommendations  BSC/3in1    Recommendations for Other Services       Precautions / Restrictions Precautions Precautions: Fall Restrictions Weight Bearing Restrictions: No      Mobility Bed Mobility Overal bed mobility: Needs Assistance Bed Mobility: Sit to Supine       Sit to supine: Supervision   General bed mobility comments: increased time/effort    Transfers Overall transfer level: Needs assistance Equipment used: Rolling walker (2 wheels) Transfers: Sit to/from Stand, Bed to chair/wheelchair/BSC Sit to Stand: Min assist     Step pivot transfers: Min assist, Min guard     General transfer comment: bed elevated, VC for hand placement      Balance Overall balance assessment: Needs assistance   Sitting balance-Leahy Scale: Fair     Standing balance support: Bilateral upper extremity supported, Reliant on assistive device for balance Standing balance-Leahy Scale: Fair Standing balance comment: very pigeon toed                           ADL either performed or assessed with clinical judgement   ADL Overall ADL's : Needs assistance/impaired  General ADL Comments: Pt currently requires MIN A for LB ADL, MIN A for ADL transfers, and increased time/effort to perform tasks, limited by decreased strength, activity tolerance, and low back pain.     Vision         Perception     Praxis       Pertinent Vitals/Pain Pain Assessment Pain Assessment: 0-10 Pain Score: 10-Worst pain ever Pain Location: Low back/L Hip Pain Descriptors / Indicators: Grimacing, Discomfort, Aching, Moaning Pain Intervention(s): Limited activity within patient's tolerance, Monitored during session, Repositioned, Patient requesting pain meds-RN notified     Hand Dominance     Extremity/Trunk Assessment Upper Extremity Assessment Upper Extremity Assessment: Generalized weakness   Lower Extremity Assessment Lower Extremity Assessment: Generalized weakness       Communication     Cognition Arousal/Alertness: Awake/alert Behavior During Therapy: WFL for tasks assessed/performed Overall Cognitive Status: Within Functional Limits for tasks assessed                                 General Comments: PRN VC to redirect to task     General Comments       Exercises Other Exercises Other Exercises: Pt instructed in ADL transfers and RW mgt, strategies to minimize back pain during movement   Shoulder Instructions      Home Living Family/patient expects to be discharged to:: Private residence Living Arrangements: Spouse/significant other Available Help at Discharge: Family Type of Home: House Home Access: Stairs to enter Technical brewer of Steps: ~6 Entrance Stairs-Rails: Right;Left;Can reach both Home Layout: One level     Bathroom Shower/Tub: Tub/shower unit;Walk-in shower   Bathroom Toilet: Standard     Home Equipment: Rollator (4 wheels)          Prior Functioning/Environment Prior Level of Function : Independent/Modified Independent                        OT Problem List: Decreased strength;Pain;Decreased activity tolerance;Impaired balance (sitting and/or standing);Decreased knowledge of use of DME or AE;Decreased safety awareness      OT Treatment/Interventions: Self-care/ADL training;Therapeutic exercise;Therapeutic activities;Energy  conservation;DME and/or AE instruction;Patient/family education;Balance training    OT Goals(Current goals can be found in the care plan section) Acute Rehab OT Goals Patient Stated Goal: have less pain OT Goal Formulation: With patient Time For Goal Achievement: 08/31/21 Potential to Achieve Goals: Good ADL Goals Pt Will Perform Lower Body Dressing: sit to/from stand;with min guard assist;with adaptive equipment Pt Will Transfer to Toilet: with supervision;ambulating;bedside commode (LRAD PRN) Pt Will Perform Toileting - Clothing Manipulation and hygiene: with set-up;sitting/lateral leans Additional ADL Goal #1: Pt will verbalize plan to utilize at least 1 learned falls prevention strategy to support safety/indep with ADL.  OT Frequency: Min 2X/week    Co-evaluation              AM-PAC OT "6 Clicks" Daily Activity     Outcome Measure Help from another person eating meals?: None Help from another person taking care of personal grooming?: None Help from another person toileting, which includes using toliet, bedpan, or urinal?: A Little Help from another person bathing (including washing, rinsing, drying)?: A Little Help from another person to put on and taking off regular upper body clothing?: None Help from another person to put on and taking off regular lower body clothing?: A Little 6 Click Score: 21   End of Session Nurse Communication:  Other (comment);Mobility status (IV beeping)  Activity Tolerance: Patient limited by pain Patient left: in bed;with call bell/phone within reach;with bed alarm set  OT Visit Diagnosis: Other abnormalities of gait and mobility (R26.89);Muscle weakness (generalized) (M62.81);Pain Pain - Right/Left:  (low back)                Time: 8185-6314 OT Time Calculation (min): 25 min Charges:  OT General Charges $OT Visit: 1 Visit OT Evaluation $OT Eval Moderate Complexity: 1 Mod OT Treatments $Self Care/Home Management : 8-22 mins  Ardeth Perfect.,  MPH, MS, OTR/L ascom [phone] 08/17/21, 4:14 PM

## 2021-08-17 NOTE — Plan of Care (Signed)

## 2021-08-17 NOTE — Evaluation (Addendum)
Physical Therapy Evaluation Patient Details Name: Lance House MRN: [id number] DOB: April 01, 1951 Today's Date: 08/17/2021  History of Present Illness  Pt is a 71 yo male with who presented to the ED with acute onset of generalized weakness and a fall in the kitchen in front of the refrigerator. PmHx: anxiety, depression, gout, and chronic alcohol use.   Clinical Impression  Pt A&Ox4 upon PT entrance into room for evaluation. Pt resting in bed and reports he is in 10/10 pain specific to his low back and left hip. He reports he was independent w/ all ADLs prior to admission to the hospital. Lives in a 1 story home w/ 6 steps to enter with railing on both sides. He states his bathroom set up consists of a walk-in shower and standard toilet height.  Pt was able to perform bed mobility to EOB w/ supervision and increased time due to pain. Once seated at EOB, he was able to perform sit to stand w/ the bed height elevated and w/ minA using a RW. Pt required verbal cueing about proper UE placement w/ walker for best utilization and safety to perform movement. He was then able to step-pivot transfer to the recliner using the RW and w/ CGA. Pt reported no change in pain level following PT session. Pt will benefit from continued skilled PT in order to improve LE strength, mobility, gait, decrease c/o pain, and restore PLOF. Current discharge recommendation to SNF is appropriate due to the level of assistance required, as well as limitations in activity tolerance/endurance by the patient to ensure safety and improve overall function.      Recommendations for follow up therapy are one component of a multi-disciplinary discharge planning process, led by the attending physician.  Recommendations may be updated based on patient status, additional functional criteria and insurance authorization.  Follow Up Recommendations Skilled nursing-short term rehab (<3 hours/day)    Assistance Recommended at Discharge  Intermittent Supervision/Assistance  Patient can return home with the following  A lot of help with walking and/or transfers;A lot of help with bathing/dressing/bathroom;Assistance with feeding;Assist for transportation;Assistance with cooking/housework    Equipment Recommendations Rolling walker (2 wheels)  Recommendations for Other Services       Functional Status Assessment Patient has had a recent decline in their functional status and demonstrates the ability to make significant improvements in function in a reasonable and predictable amount of time.     Precautions / Restrictions Precautions Precautions: Fall Restrictions Weight Bearing Restrictions: No      Mobility  Bed Mobility Overal bed mobility: Needs Assistance Bed Mobility: Supine to Sit     Supine to sit: Min guard     General bed mobility comments: increased time due to pain    Transfers Overall transfer level: Needs assistance Equipment used: Rolling walker (2 wheels) Transfers: Sit to/from Stand, Bed to chair/wheelchair/BSC Sit to Stand: Min assist, Min guard   Step pivot transfers: Min assist, Min guard       General transfer comment: bed height elevated/increased time due to pain level.    Ambulation/Gait                  Stairs            Wheelchair Mobility    Modified Rankin (Stroke Patients Only)       Balance Overall balance assessment: Needs assistance Sitting-balance support: Bilateral upper extremity supported, Feet supported Sitting balance-Leahy Scale: Fair     Standing balance support: Bilateral upper extremity supported,  Reliant on assistive device for balance Standing balance-Leahy Scale: Fair                               Pertinent Vitals/Pain Pain Assessment Pain Assessment: 0-10 Pain Score: 10-Worst pain ever Pain Location: Low back/L Hip Pain Descriptors / Indicators: Grimacing, Discomfort, Aching, Moaning Pain Intervention(s):  Limited activity within patient's tolerance, Monitored during session, Repositioned, Patient requesting pain meds-RN notified    Home Living Family/patient expects to be discharged to:: Private residence Living Arrangements: Spouse/significant other Available Help at Discharge: Family Type of Home: House Home Access: Stairs to enter Entrance Stairs-Rails: Right;Left;Can reach both Technical brewer of Steps: ~6   Home Layout: One level Home Equipment: Rollator (4 wheels)      Prior Function Prior Level of Function : Independent/Modified Independent                     Hand Dominance        Extremity/Trunk Assessment   Upper Extremity Assessment Upper Extremity Assessment: Overall WFL for tasks assessed;Generalized weakness    Lower Extremity Assessment Lower Extremity Assessment: Overall WFL for tasks assessed;Generalized weakness       Communication      Cognition Arousal/Alertness: Awake/alert Behavior During Therapy: WFL for tasks assessed/performed Overall Cognitive Status: Within Functional Limits for tasks assessed                                          General Comments      Exercises     Assessment/Plan    PT Assessment Patient needs continued PT services  PT Problem List Decreased strength;Decreased range of motion;Decreased activity tolerance;Decreased balance;Decreased mobility;Decreased coordination;Pain;Decreased knowledge of use of DME       PT Treatment Interventions Gait training;Stair training;Functional mobility training;Therapeutic activities;Therapeutic exercise;Balance training;Neuromuscular re-education;Patient/family education;DME instruction    PT Goals (Current goals can be found in the Care Plan section)  Acute Rehab PT Goals Patient Stated Goal: decrease back/hip pain, increase LE to go home PT Goal Formulation: With patient Time For Goal Achievement: 08/31/21 Potential to Achieve Goals: Fair     Frequency Min 2X/week     Co-evaluation               AM-PAC PT "6 Clicks" Mobility  Outcome Measure Help needed turning from your back to your side while in a flat bed without using bedrails?: A Little Help needed moving from lying on your back to sitting on the side of a flat bed without using bedrails?: A Little Help needed moving to and from a bed to a chair (including a wheelchair)?: A Little Help needed standing up from a chair using your arms (e.g., wheelchair or bedside chair)?: A Little Help needed to walk in hospital room?: A Lot Help needed climbing 3-5 steps with a railing? : A Lot 6 Click Score: 16    End of Session Equipment Utilized During Treatment: Gait belt Activity Tolerance: Patient tolerated treatment well;Patient limited by pain Patient left: in chair;with call bell/phone within reach;with chair alarm set Nurse Communication: Mobility status PT Visit Diagnosis: Unsteadiness on feet (R26.81);History of falling (Z91.81);Muscle weakness (generalized) (M62.81);Pain Pain - part of body:  (back/hip)    Time: 0998-3382 PT Time Calculation (min) (ACUTE ONLY): 30 min   Charges:  Jonnie Kind, SPT 08/17/2021, 2:32 PM

## 2021-08-17 NOTE — Consult Note (Signed)
Neurosurgery-New Consultation Evaluation 08/17/2021 Lance House [id number]  Identifying Statement: Lance House is a 71 y.o. male from [place] [id number] with small Humphreys  Physician Requesting Consultation: [facility] ED  History of Present Illness: [name] was admitted after history of falls.  He was found on CT of the head to have a small amount of subependymal blood in right lateral ventricle. He is on no anticoagulants. In the ED, he had a repeat CT of the head that was stable. He does not endorse any headaches. He denies any new speech changes, weakness, or numbness. He does have chronic back pain.   Past Medical History:  Past Medical History:  Diagnosis Date   Anxiety    Depression    Gout     Social History: Social History   Socioeconomic History   Marital status: Married    Spouse name: Not on file   Number of children: Not on file   Years of education: Not on file   Highest education level: Not on file  Occupational History   Not on file  Tobacco Use   Smoking status: Every Day    Packs/day: 2.00    Types: Cigarettes   Smokeless tobacco: Never  Vaping Use   Vaping Use: Never used  Substance and Sexual Activity   Alcohol use: Yes    Alcohol/week: 42.0 standard drinks    Types: 42 Cans of beer per week   Drug use: Not Currently   Sexual activity: Not on file  Other Topics Concern   Not on file  Social History Narrative   Not on file   Social Determinants of Health   Financial Resource Strain: Not on file  Food Insecurity: Not on file  Transportation Needs: Not on file  Physical Activity: Not on file  Stress: Not on file  Social Connections: Not on file  Intimate Partner Violence: Not on file     Family History: Family History  Problem Relation Age of Onset   Skin cancer Father    Multiple sclerosis Sister    Non-Hodgkin's lymphoma Brother     Review of Systems:  Review of Systems - General ROS: Negative Psychological ROS:  Negative Ophthalmic ROS: Negative ENT ROS: Negative Hematological and Lymphatic ROS: Negative  Endocrine ROS: Negative Respiratory ROS: Negative Cardiovascular ROS: Negative Gastrointestinal ROS: Negative Genito-Urinary ROS: Negative Musculoskeletal ROS: Positive for back pain Neurological ROS: Negative for headache Dermatological ROS: Negative  Physical Exam: BP (!) 133/91    Pulse 98    Temp 97.6 F (36.4 C) (Oral)    Resp 18    Ht 6\' 1"  (1.854 m)    Wt 86.2 kg    SpO2 96%    BMI 25.07 kg/m  Body mass index is 25.07 kg/m. Body surface area is 2.11 meters squared. General appearance: Alert, cooperative, in no acute distress Head: Normocephalic, atraumatic Eyes: Normal, EOM intact Oropharynx: Moist without lesions Ext: No edema in LE   Neurologic exam:  Mental status: alertness: alert, orientation: person, place, time, affect: normal Speech: fluent and clear, names and repeats Cranial nerves:  II: Visual fields are full by confrontation, no ptosis III/IV/VI: extra-ocular motions intact bilaterally V/VII:no evidence of facial droop or weakness  VIII: hearing normal XI: trapezius strength symmetric,  sternocleidomastoid strength symmetric XII: tongue strength symmetric  Motor:strength symmetric 5/5, normal muscle mass and tone in all extremities  Sensory: intact to light touch in all extremities Gait: not tested   Imaging: CT Head: Stable small focus of  hyperdensity along the margin of the right lateral ventricle. May reflect hemorrhage as previously suggested or alternatively, an incidental lesion such as cavernous malformation.   Impression/Plan:  [name] has a CT showing a small amount of hyperdensity along right lateral ventricle that requires no acute treatment. This may be traumatic in nature but no further imaging is needed at this time. We can follow up as outpatient in 3-4 weeks. He is clear for DVT prophylaxis as needed.    1.  Diagnosis: Small IPH  2.   Plan - No further imaging needed at this time - Follow up 3-4 weeks as outpatient

## 2021-08-17 NOTE — Progress Notes (Signed)
Patient called regarding small subependymal hemorrhage on CT head. Repeat is stable and no further imaging or treatment needed. Can see as outpatient in 2-3 weeks. Clear for any medications and DVT prophylaxis if needed. No anti-epileptic needed.

## 2021-08-18 ENCOUNTER — Inpatient Hospital Stay: Payer: Medicare HMO

## 2021-08-18 ENCOUNTER — Inpatient Hospital Stay (HOSPITAL_COMMUNITY)
Admit: 2021-08-18 | Discharge: 2021-08-18 | Disposition: A | Discharge status: Home / Self Care | Payer: Medicare HMO | Attending: Internal Medicine | Admitting: Internal Medicine

## 2021-08-18 DIAGNOSIS — S22000A Wedge compression fracture of unspecified thoracic vertebra, initial encounter for closed fracture: Secondary | ICD-10-CM

## 2021-08-18 DIAGNOSIS — R778 Other specified abnormalities of plasma proteins: Secondary | ICD-10-CM | POA: Diagnosis not present

## 2021-08-18 DIAGNOSIS — S32040A Wedge compression fracture of fourth lumbar vertebra, initial encounter for closed fracture: Secondary | ICD-10-CM

## 2021-08-18 DIAGNOSIS — I10 Essential (primary) hypertension: Secondary | ICD-10-CM

## 2021-08-18 DIAGNOSIS — S22080D Wedge compression fracture of T11-T12 vertebra, subsequent encounter for fracture with routine healing: Secondary | ICD-10-CM

## 2021-08-18 HISTORY — DX: Wedge compression fracture of fourth lumbar vertebra, initial encounter for closed fracture: S32.040A

## 2021-08-18 HISTORY — DX: Wedge compression fracture of unspecified thoracic vertebra, initial encounter for closed fracture: S22.000A

## 2021-08-18 LAB — CBC WITH DIFFERENTIAL/PLATELET
Abs Immature Granulocytes: 0.06 10*3/uL (ref 0.00–0.07)
Basophils Absolute: 0.1 10*3/uL (ref 0.0–0.1)
Basophils Relative: 1 %
Eosinophils Absolute: 0.2 10*3/uL (ref 0.0–0.5)
Eosinophils Relative: 2 %
HCT: 39 % (ref 39.0–52.0)
Hemoglobin: 13.7 g/dL (ref 13.0–17.0)
Immature Granulocytes: 1 %
Lymphocytes Relative: 15 %
Lymphs Abs: 1.1 10*3/uL (ref 0.7–4.0)
MCH: 33.2 pg (ref 26.0–34.0)
MCHC: 35.1 g/dL (ref 30.0–36.0)
MCV: 94.4 fL (ref 80.0–100.0)
Monocytes Absolute: 0.7 10*3/uL (ref 0.1–1.0)
Monocytes Relative: 9 %
Neutro Abs: 5.3 10*3/uL (ref 1.7–7.7)
Neutrophils Relative %: 72 %
Platelets: 190 10*3/uL (ref 150–400)
RBC: 4.13 MIL/uL — ABNORMAL LOW (ref 4.22–5.81)
RDW: 13.9 % (ref 11.5–15.5)
WBC: 7.4 10*3/uL (ref 4.0–10.5)
nRBC: 0 % (ref 0.0–0.2)

## 2021-08-18 LAB — BASIC METABOLIC PANEL
Anion gap: 7 (ref 5–15)
BUN: <5 mg/dL — ABNORMAL LOW (ref 8–23)
CO2: 25 mmol/L (ref 22–32)
Calcium: 8.3 mg/dL — ABNORMAL LOW (ref 8.9–10.3)
Chloride: 98 mmol/L (ref 98–111)
Creatinine, Ser: 0.44 mg/dL — ABNORMAL LOW (ref 0.61–1.24)
GFR, Estimated: >60 mL/min (ref >60)
Glucose, Bld: 91 mg/dL (ref 70–99)
Potassium: 3.8 mmol/L (ref 3.5–5.1)
Sodium: 130 mmol/L — ABNORMAL LOW (ref 135–145)

## 2021-08-18 LAB — ECHOCARDIOGRAM COMPLETE
AR max vel: 2.97 cm2
AV Area VTI: 3.21 cm2
AV Area mean vel: 3 cm2
AV Mean grad: 3 mmHg
AV Peak grad: 4.6 mmHg
Ao pk vel: 1.07 m/s
Area-P 1/2: 8.52 cm2
Height: 73 in
MV VTI: 3.63 cm2
S' Lateral: 4.61 cm
Weight: 3040 oz

## 2021-08-18 LAB — TROPONIN I (HIGH SENSITIVITY): Troponin I (High Sensitivity): 86 ng/L — ABNORMAL HIGH (ref <18)

## 2021-08-18 LAB — CK: Total CK: 1750 U/L — ABNORMAL HIGH (ref 49–397)

## 2021-08-18 LAB — MAGNESIUM: Magnesium: 1.9 mg/dL (ref 1.7–2.4)

## 2021-08-18 IMAGING — MR MR THORACIC SPINE W/O CM
5 of 6 series · 31 of 48 positions shown · non-contrast
Comparison: None.

CLINICAL DATA: Compression fracture

EXAM:
MRI THORACIC AND LUMBAR SPINE WITHOUT CONTRAST
TECHNIQUE: Multiplanar and multiecho pulse sequences of the thoracic and lumbar
spine were obtained without intravenous contrast.

[Series 16: T1 · sagittal · 5.0mm · 1.41mm/px · 4 of 9 slices shown (1 of 2)]
[im 1/9]
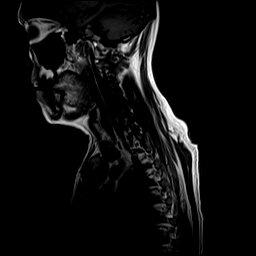
[im 3/9]
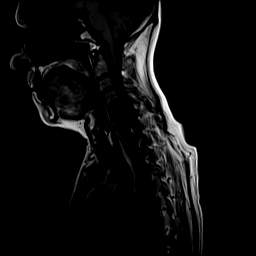
[im 6/9]
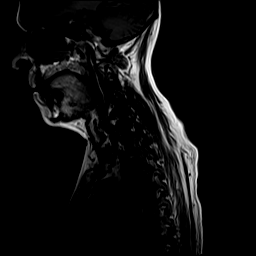
[im 9/9]
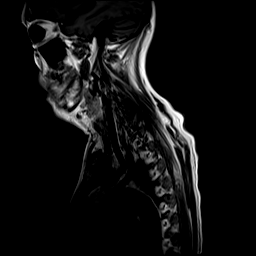

[Series 17: T2 · sagittal · 3.0mm · 1.06mm/px · 6 of 18 slices shown (1 of 2)]
[im 1/18]
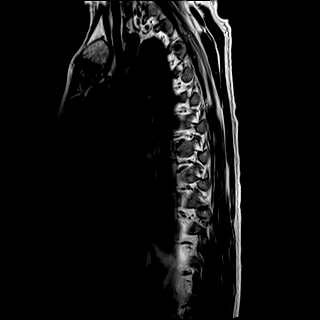
[im 4/18]
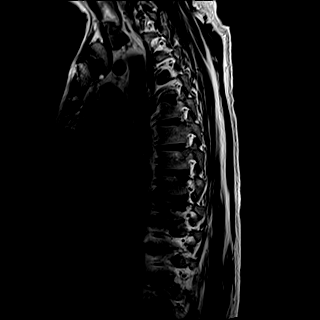
[im 7/18]
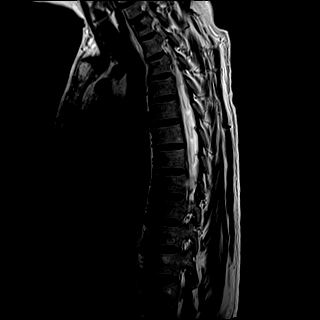
[im 11/18]
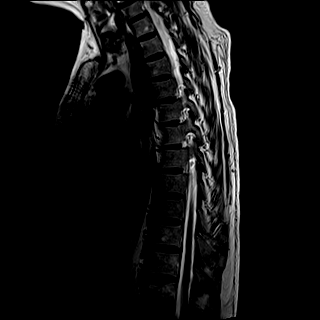
[im 14/18]
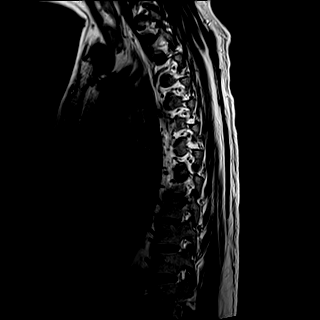
[im 18/18]
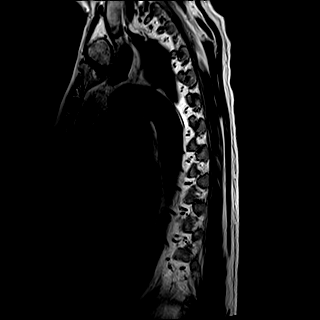

[Series 18: T1 · sagittal · 3.0mm · 1.06mm/px · 6 of 18 slices shown (2 of 2)]
[im 1/18]
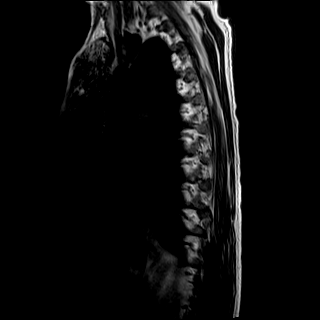
[im 4/18]
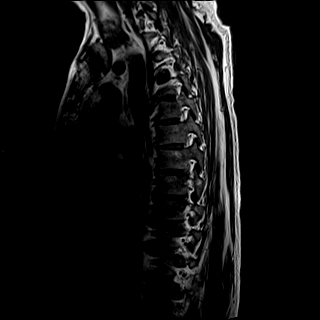
[im 7/18]
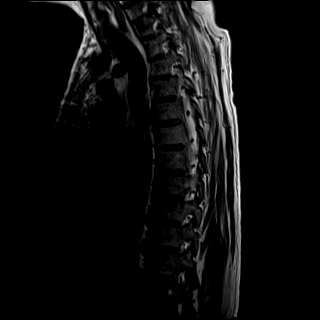
[im 11/18]
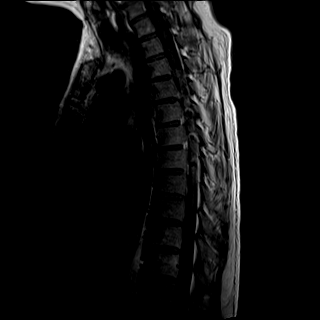
[im 14/18]
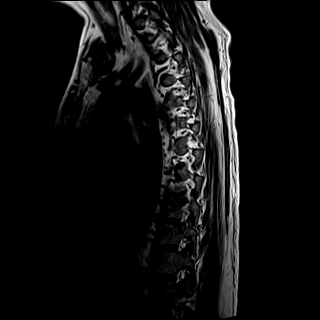
[im 18/18]
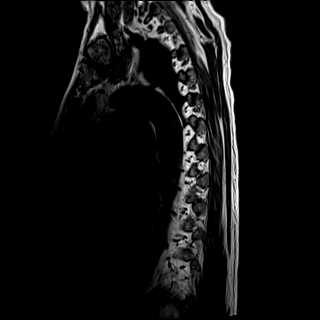

[Series 19: STIR · sagittal · 3.0mm · 0.53mm/px · 6 of 18 slices shown]
[im 1/18]
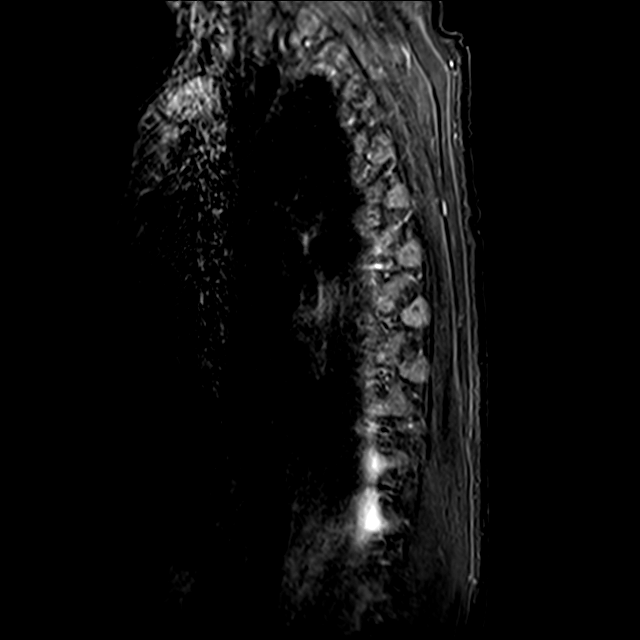
[im 4/18]
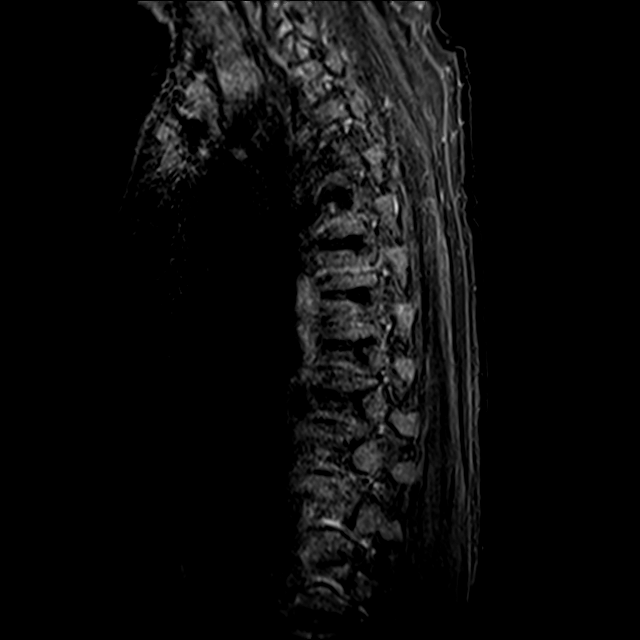
[im 7/18]
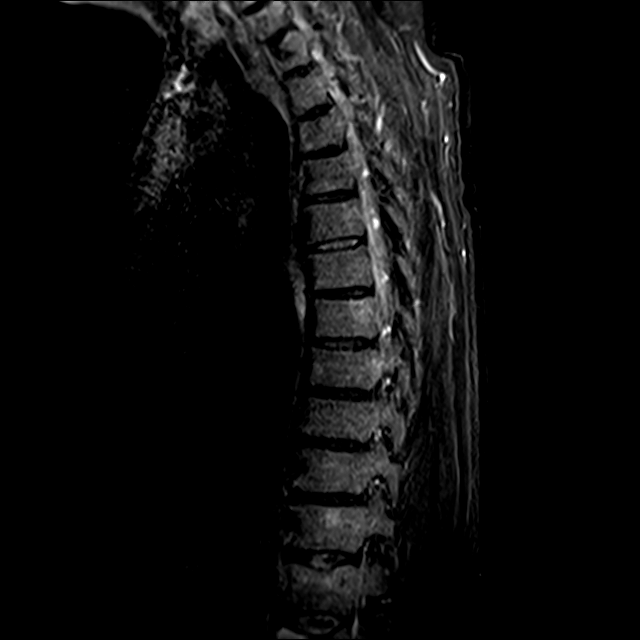
[im 11/18]
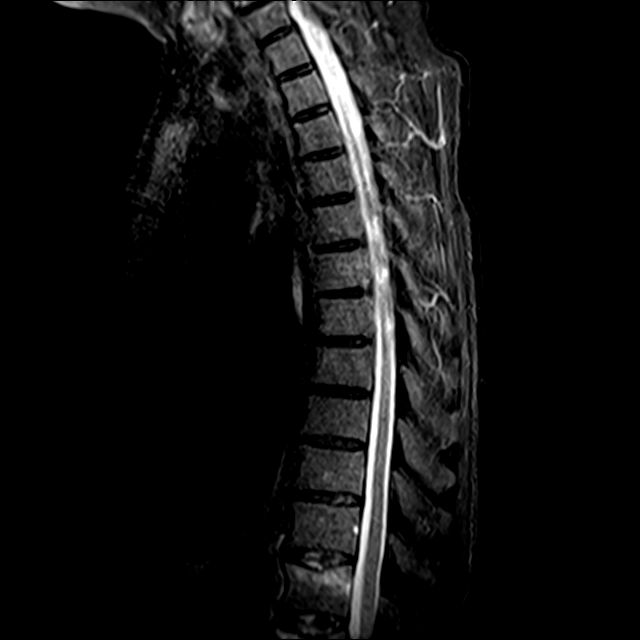
[im 14/18]
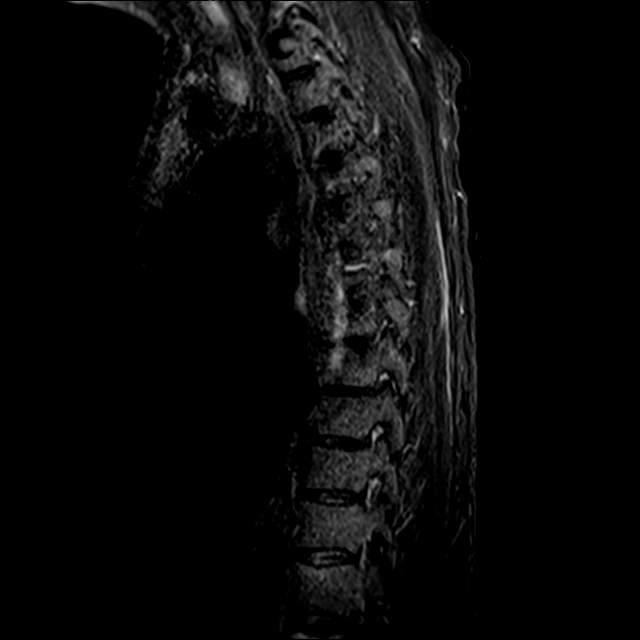
[im 18/18]
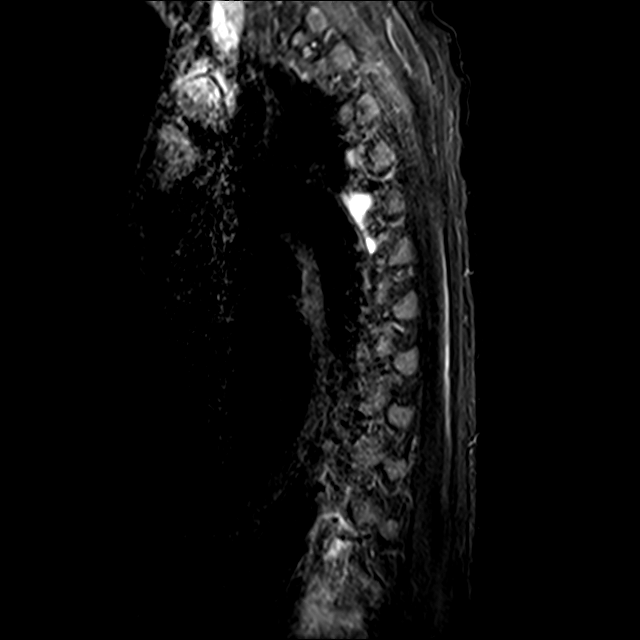

[Series 20: T2 · axial · 4.0mm · 0.59mm/px · z∈[-239,+40]mm · 9 of 39 slices shown (2 of 2)]
[im 1/39]
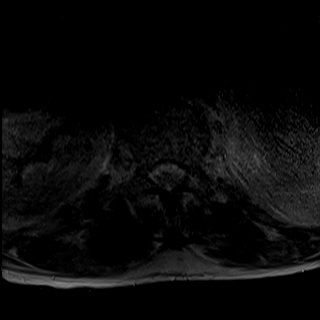
[im 7/39]
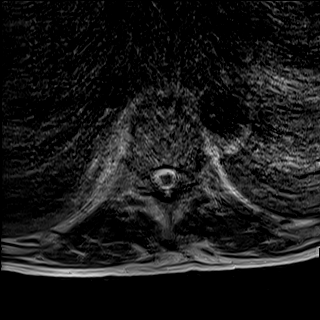
[im 13/39]
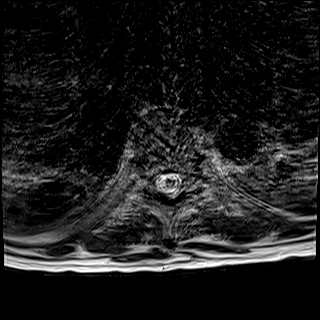
[im 16/39]
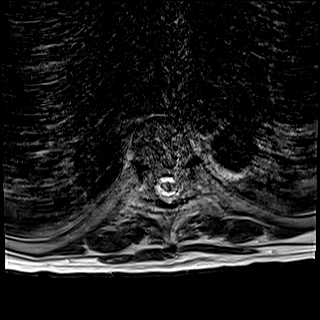
[im 20/39]
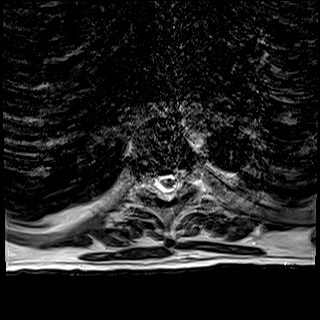
[im 23/39]
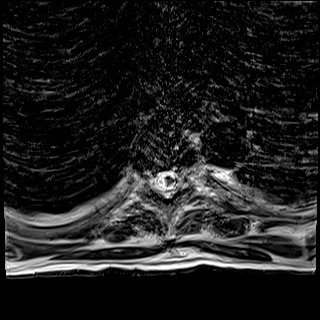
[im 26/39]
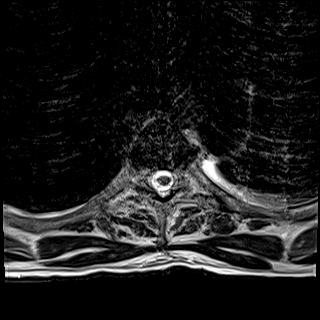
[im 32/39]
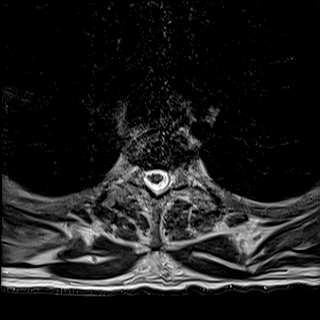
[im 39/39]
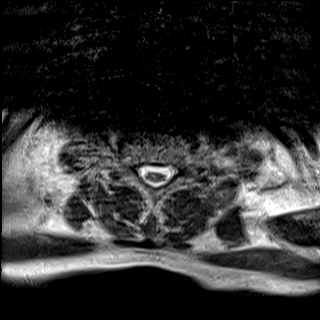

[31 of 48 positions shown; findings below may reference images not displayed]

FINDINGS: MRI THORACIC SPINE FINDINGS

Alignment:  Normal

Vertebrae: Mild compression deformity of T12 with mild edema
underlying the superior endplate. The other vertebral bodies are
normal.

Cord:  Normal

Paraspinal and other soft tissues: Small pleural effusions

Disc levels:

No spinal canal stenosis.

MRI LUMBAR SPINE FINDINGS

Segmentation:  Standard.

Alignment:  Physiologic.

Vertebrae: Mild compression deformity of L1 and severe compression
deformity of L4. Mild superior L1 endplate edema. Moderate diffuse
L4 edema. At L4, there is 4 mm of retropulsion.

Conus medullaris and cauda equina: Conus extends to the L2 level.
Conus and cauda equina appear normal.

Paraspinal and other soft tissues: Negative

Disc levels:

L1-L2: Normal disc space and facet joints. No spinal canal stenosis.
No neural foraminal stenosis.

L2-L3: Normal disc space and facet joints. No spinal canal stenosis.
No neural foraminal stenosis.

L3-L4: Retropulsion causes severe spinal canal stenosis. No neural
foraminal stenosis.

L4-L5: Right asymmetric disc bulge with right facet hypertrophy.
Right spinal canal stenosis. Mild bilateral neural foraminal
stenosis.

L5-S1: Normal disc space and facet joints. No spinal canal stenosis.
No neural foraminal stenosis.

Visualized sacrum: Normal.
IMPRESSION: 1. Acute to subacute compression fractures of T12, L1 and L4
2. Retropulsion of the L4 fracture causes severe spinal canal
stenosis.
3. Small pleural effusions.

## 2021-08-18 IMAGING — MR MR HIP*L* W/O CM
4 of 5 series · 26 of 40 positions shown · non-contrast
Comparison: X-ray [DATE], CT [DATE]

CLINICAL DATA: Hip pain, stress fracture suspected, neg xray

EXAM:
MR OF THE LEFT HIP WITHOUT CONTRAST
TECHNIQUE: Multiplanar, multisequence MR imaging was performed. No intravenous
contrast was administered.

[Series 11: T1 · coronal · left · 4.0mm · 0.59mm/px · 5 of 38 slices shown]
[im 1/38]
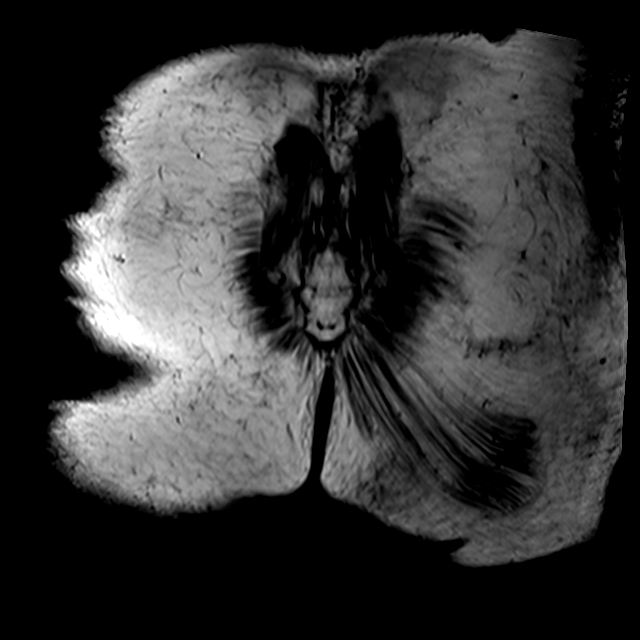
[im 5/38]
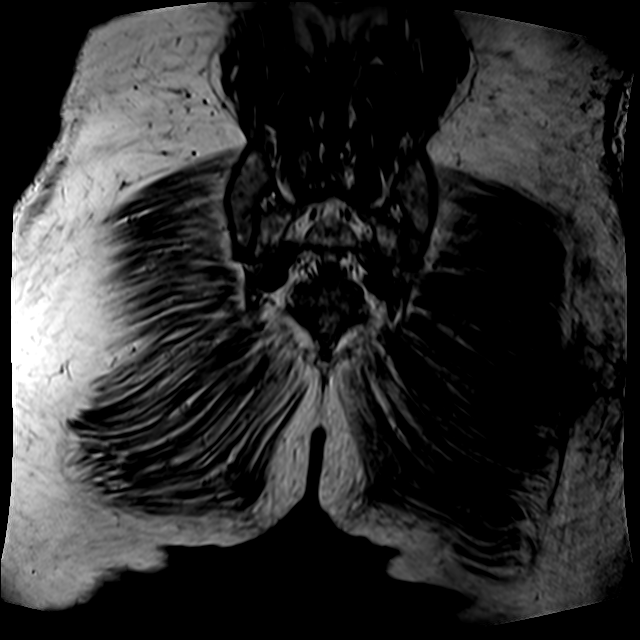
[im 13/38]
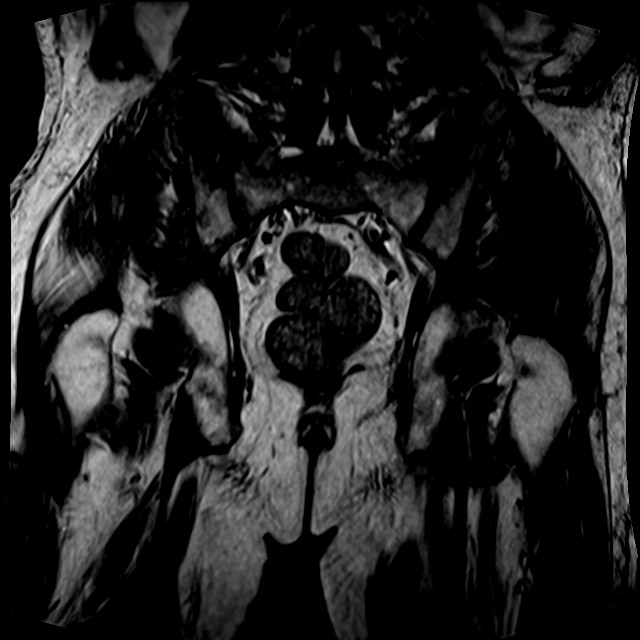
[im 21/38]
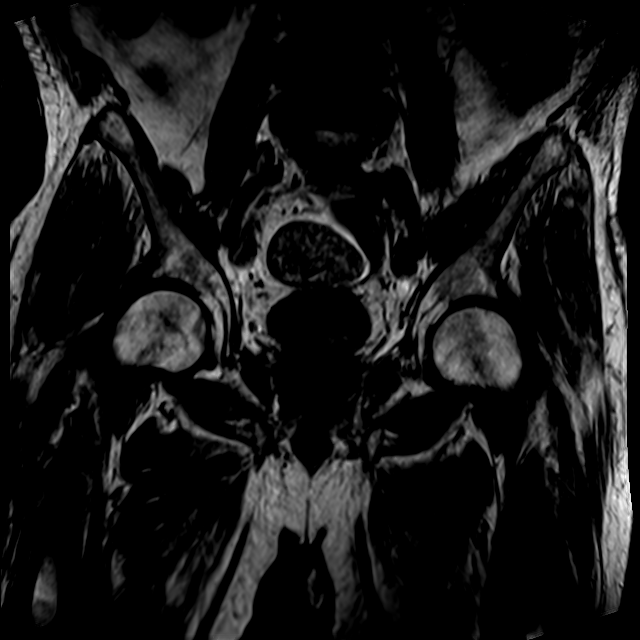
[im 33/38]
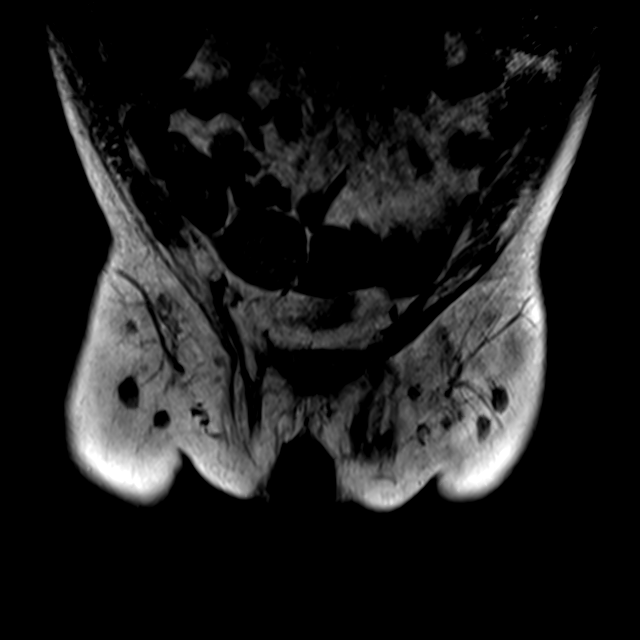

[Series 13: T2 fat-sat · axial · left · 4.0mm · 0.70mm/px · z∈[-98,+47]mm · 7 of 30 slices shown]
[im 1/30]
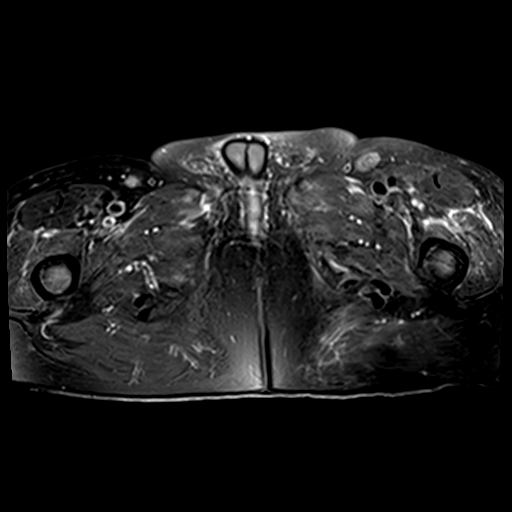
[im 5/30]
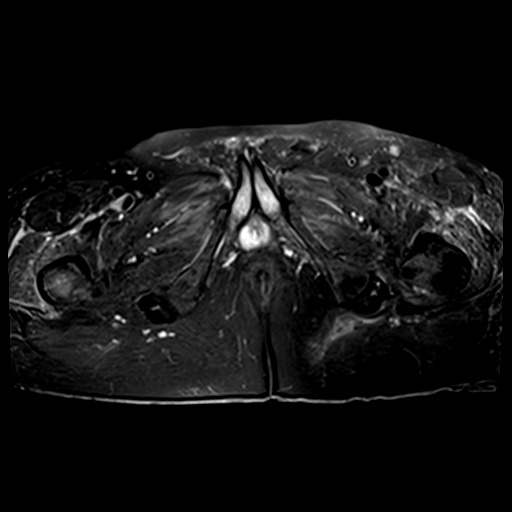
[im 10/30]
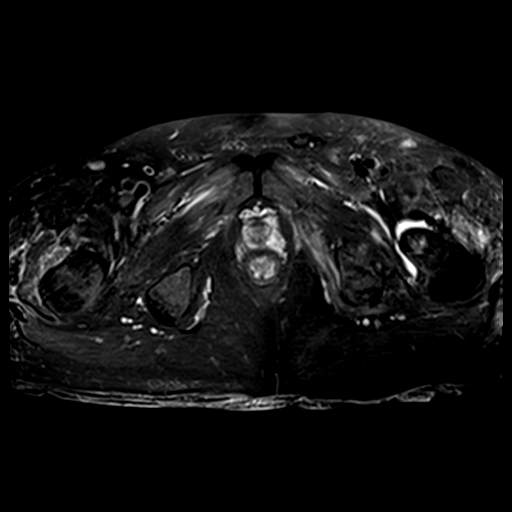
[im 15/30]
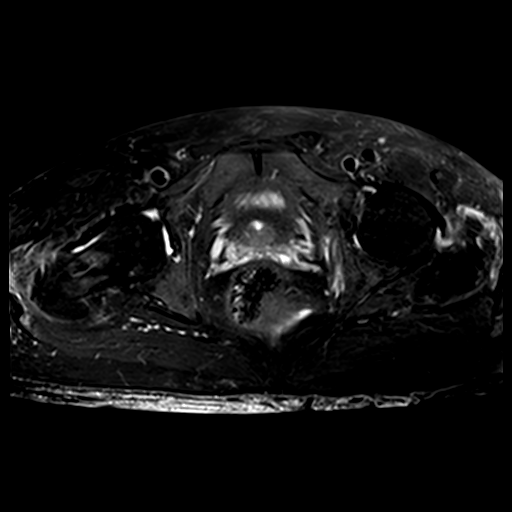
[im 20/30]
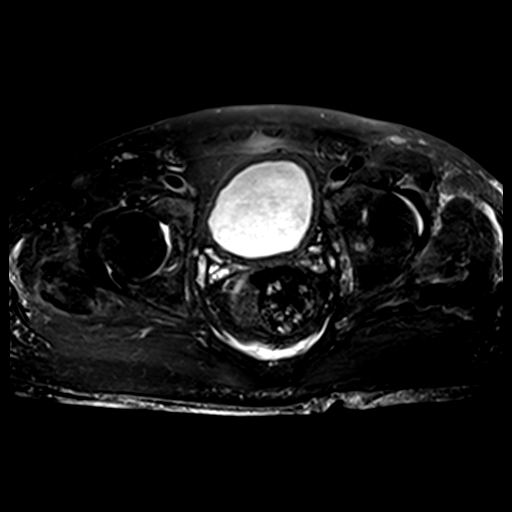
[im 25/30]
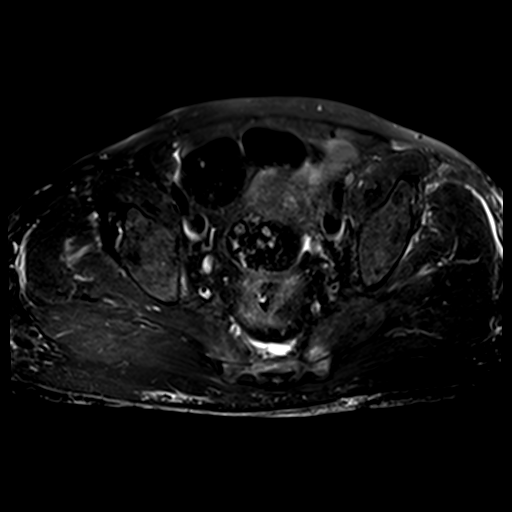
[im 30/30]
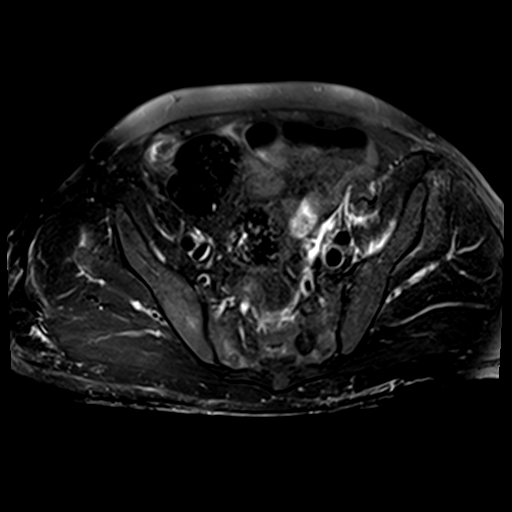

[Series 14: PD fat-sat · sagittal · left · 4.0mm · 0.70mm/px · 7 of 29 slices shown (1 of 2)]
[im 1/29]
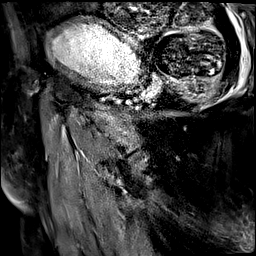
[im 5/29]
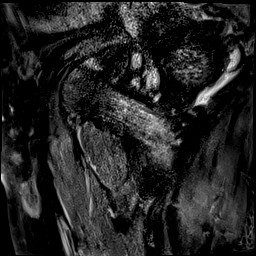
[im 10/29]
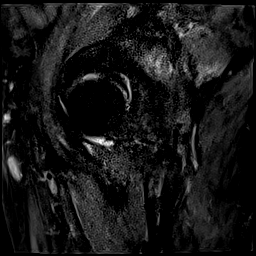
[im 15/29]
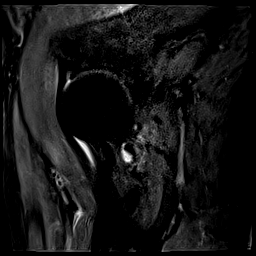
[im 19/29]
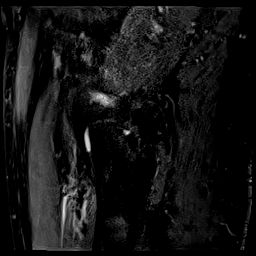
[im 24/29]
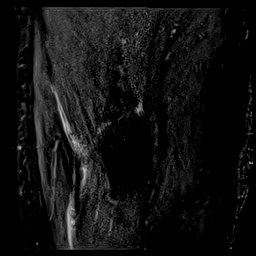
[im 29/29]
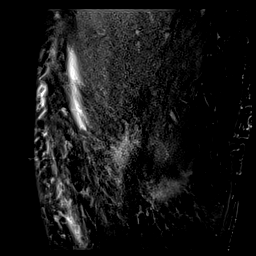

[Series 15: PD fat-sat · coronal · left · 4.0mm · 0.70mm/px · 7 of 29 slices shown (2 of 2)]
[im 1/29]
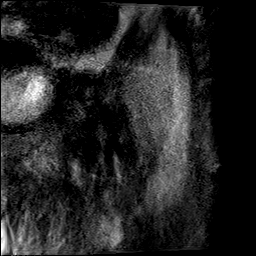
[im 5/29]
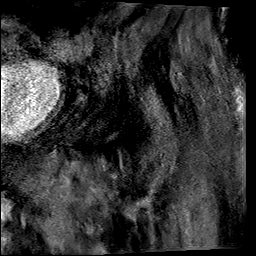
[im 10/29]
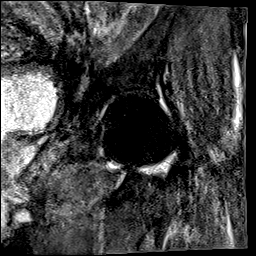
[im 15/29]
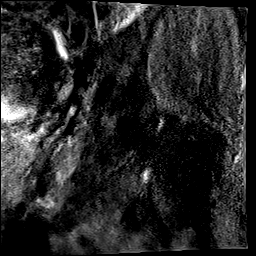
[im 19/29]
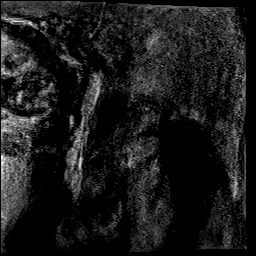
[im 24/29]
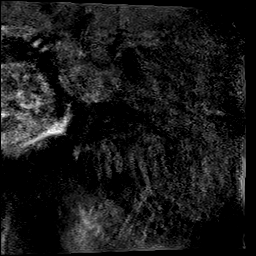
[im 29/29]
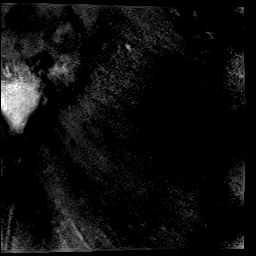

[26 of 40 positions shown; findings below may reference images not displayed]

FINDINGS: Technical Note: Despite efforts by the technologist and patient,
motion artifact is present on today's exam and could not be
eliminated. This reduces exam sensitivity and specificity.

Bones: Acute compression fracture of the L4 vertebral body, as
described on dedicated lumbar spine MRI. Elsewhere, no acute
fracture. No dislocation. No femoral head avascular necrosis. No
abnormality along the superior margin of the left femoral neck at
site of abnormality described on recent CT. Pelvic bony ring intact
without diastasis. No suspicious marrow replacing bone lesion.

Articular cartilage and labrum

Articular cartilage: No focal cartilage defect identified on motion
degraded images.

Labrum:  Suboptimally evaluated.

Joint or bursal effusion

Joint effusion:  Trace bilateral hip joint effusions, nonspecific.

Bursae: No abnormal bursal fluid collection.

Muscles and tendons

Muscles and tendons: Bilateral gluteus medius and minimus
tendinosis. Mild generalized intramuscular edema throughout the
pelvis, nonspecific.

Other findings

Miscellaneous: No fluid collection. No inguinal lymphadenopathy.
Circumferential thickening of the urinary bladder. Mild presacral
fluid/edema.
IMPRESSION: 1. Acute compression fracture of the L4 vertebral body, as described
on dedicated lumbar spine MRI.
2. No acute fracture or dislocation of the left hip. No abnormality
along the superior margin of the left femoral neck at site of
abnormality described on recent CT.
3. Bilateral gluteus medius and minimus tendinosis.
4. Mild generalized intramuscular edema throughout the pelvis,
nonspecific.
5. Circumferential thickening of the urinary bladder, which may
represent cystitis.

## 2021-08-18 IMAGING — DX DG TIBIA/FIBULA 2V*L*
4 series · 4 of 4 positions shown · non-contrast
Comparison: None.

CLINICAL DATA: Left tibia/fibula pain

EXAM:
LEFT TIBIA AND FIBULA - 2 VIEW

[tibia ap (1 of 2)]
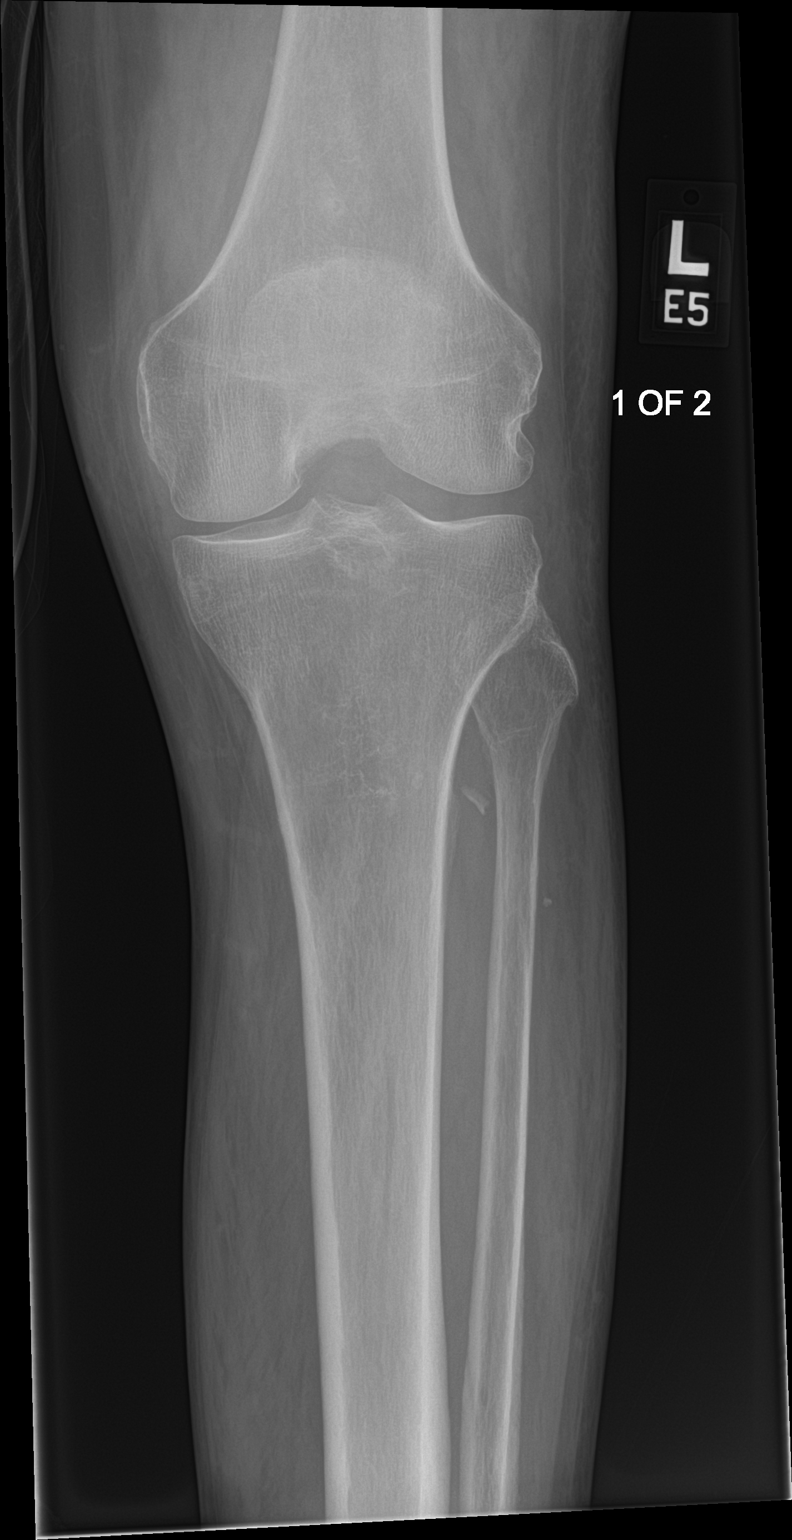

[tibia ap (2 of 2)]
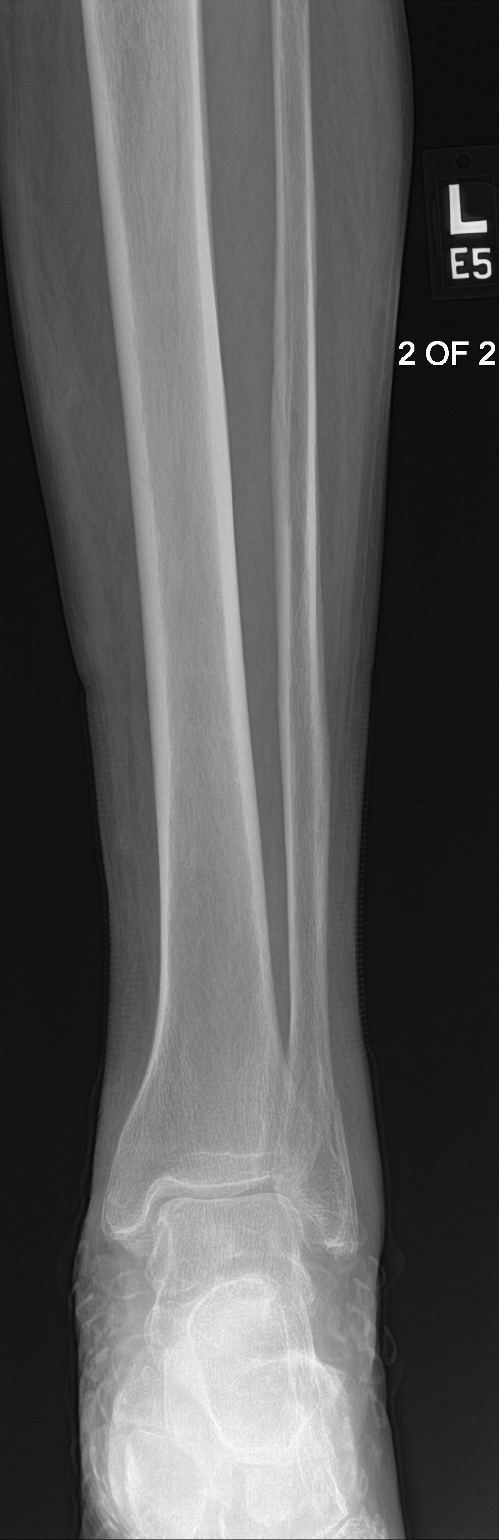

[tibia lat (1 of 2)]
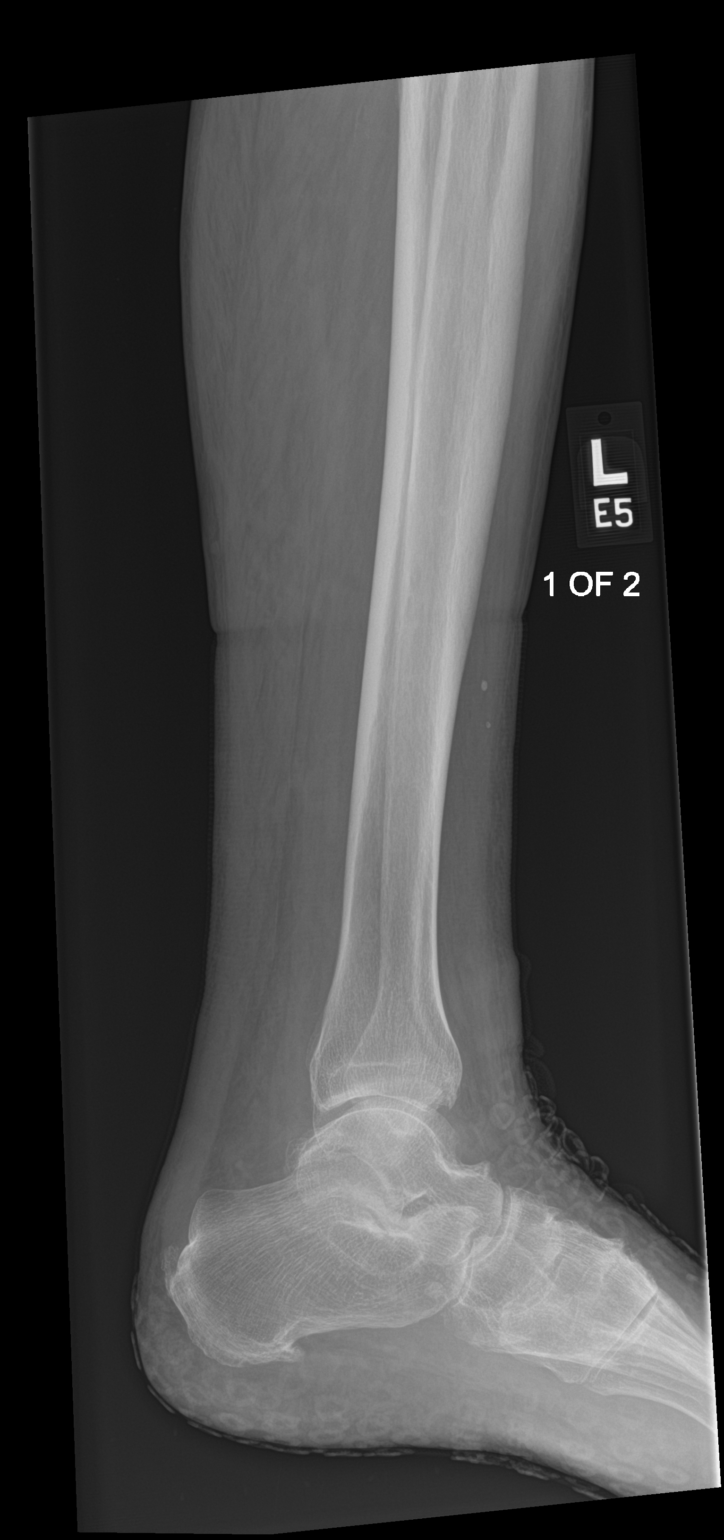

[tibia lat (2 of 2)]
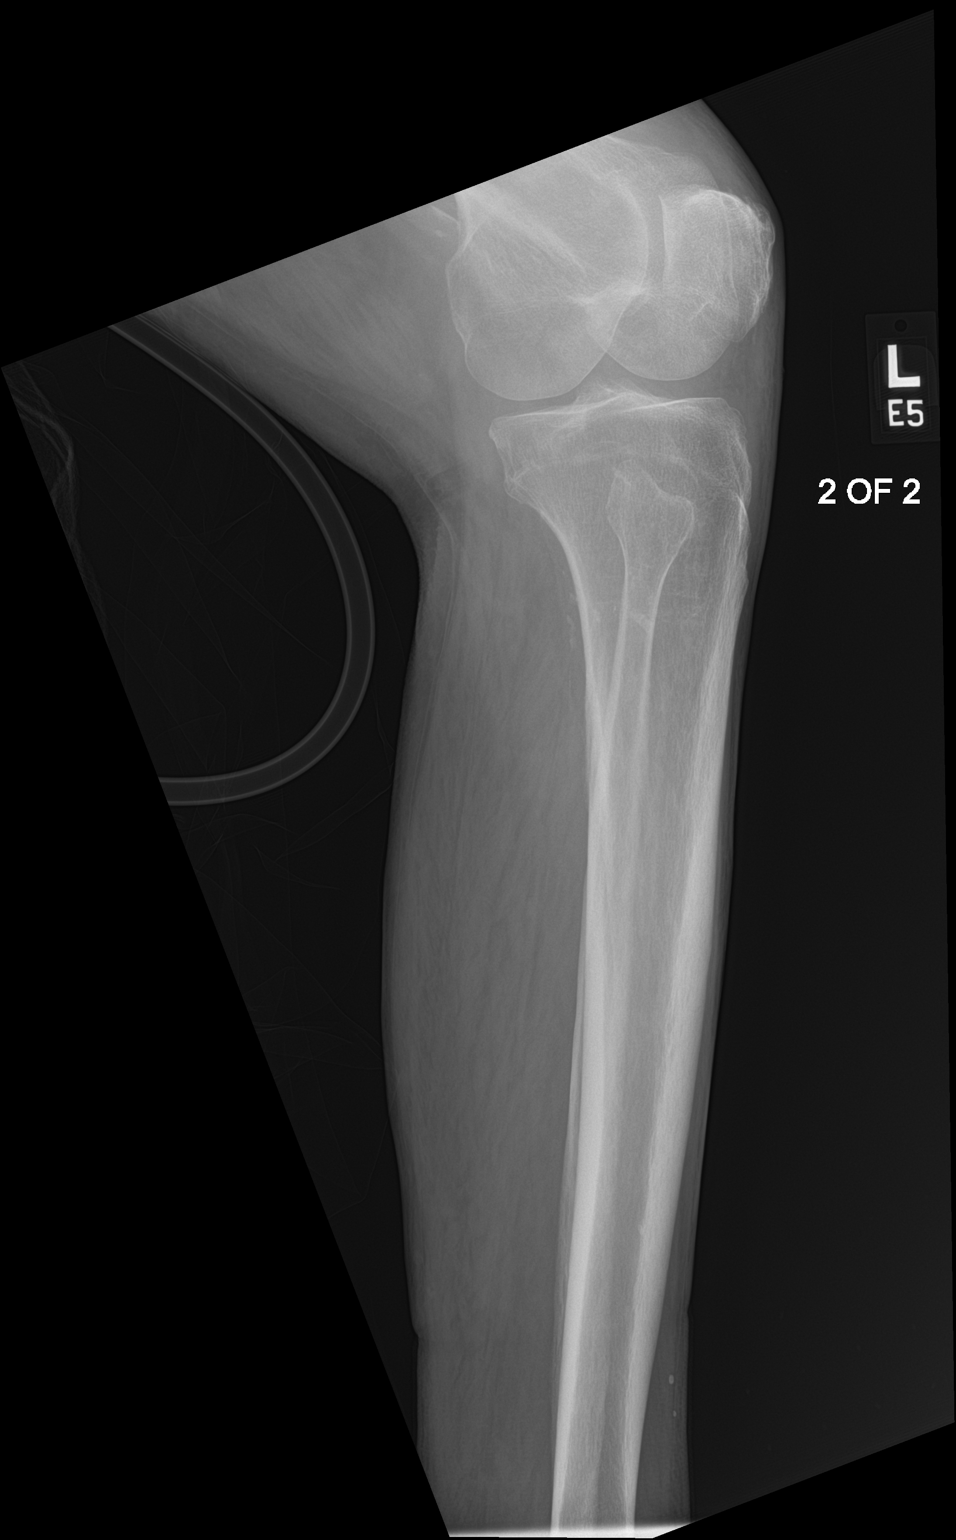

[4 of 4 positions shown; findings below may reference images not displayed]

FINDINGS: Possible nondisplaced distal fibular fracture. No evidence of
dislocation. Soft tissues are unremarkable.
IMPRESSION: Possible nondisplaced distal fibular fracture correlate for point
tenderness and recommend ankle radiographs for further evaluation.

## 2021-08-18 IMAGING — MR MR LUMBAR SPINE W/O CM
8 of 10 series · 36 of 48 positions shown · non-contrast
Comparison: None.

CLINICAL DATA: Compression fracture

EXAM:
MRI THORACIC AND LUMBAR SPINE WITHOUT CONTRAST
TECHNIQUE: Multiplanar and multiecho pulse sequences of the thoracic and lumbar
spine were obtained without intravenous contrast.

[Series 5: T2 · sagittal · 4.0mm · 0.81mm/px · 4 of 20 slices shown (1 of 4)]
[im 1/20]
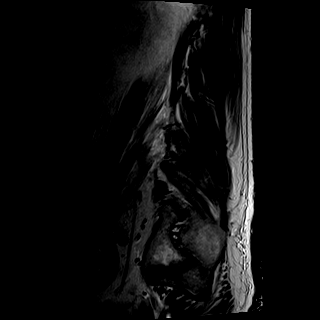
[im 7/20]
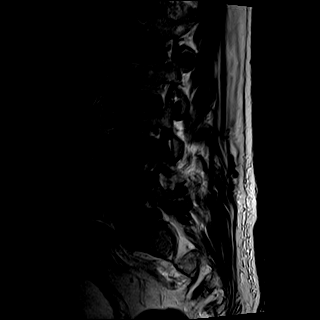
[im 13/20]
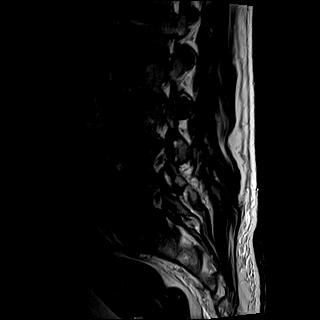
[im 20/20]
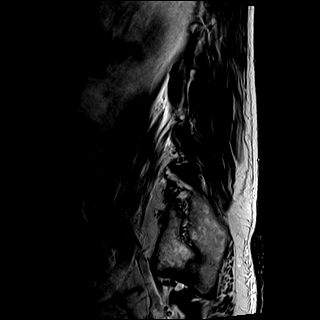

[Series 5: T2 · sagittal · 4.0mm · 0.81mm/px · 4 of 20 slices shown (2 of 4)]
[im 1/20]
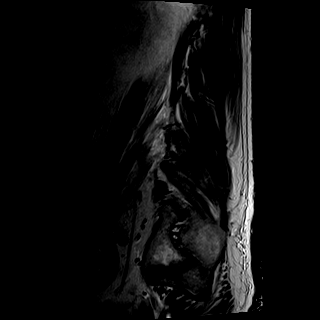
[im 7/20]
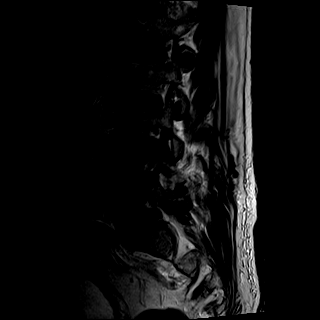
[im 13/20]
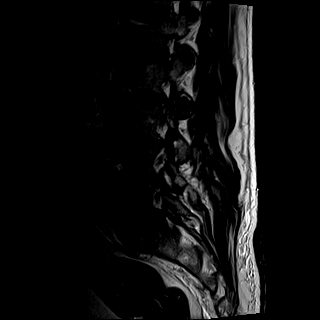
[im 20/20]
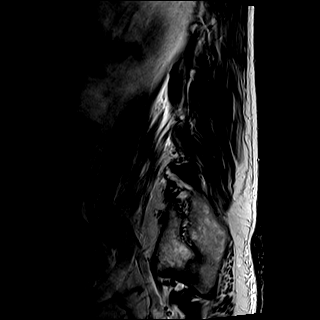

[Series 6: T1 · sagittal · 4.0mm · 0.81mm/px · 3 of 20 slices shown (1 of 4)]
[im 1/20]
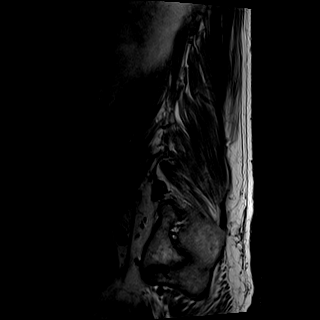
[im 10/20]
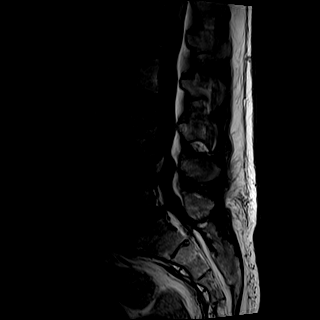
[im 20/20]
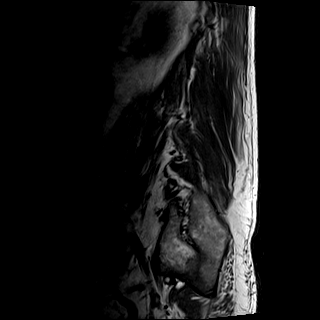

[Series 6: T1 · sagittal · 4.0mm · 0.81mm/px · 3 of 20 slices shown (2 of 4)]
[im 1/20]
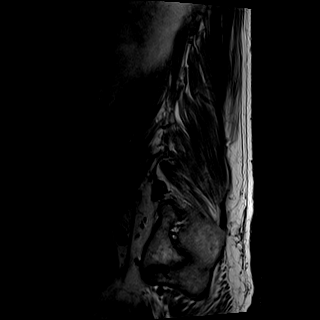
[im 10/20]
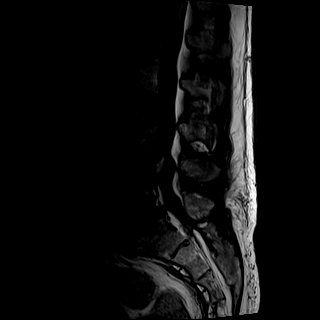
[im 20/20]
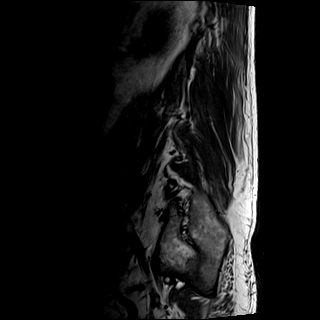

[Series 8: T2 · axial · 4.0mm · 0.78mm/px · z∈[-21,+221]mm · 7 of 42 slices shown (3 of 4)]
[im 1/42]
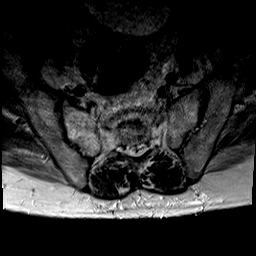
[im 7/42]
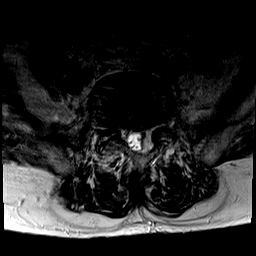
[im 14/42]
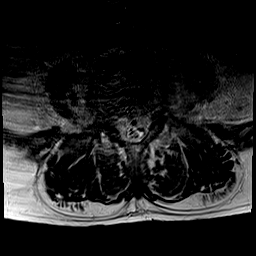
[im 21/42]
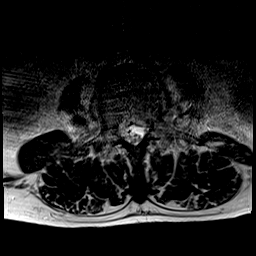
[im 28/42]
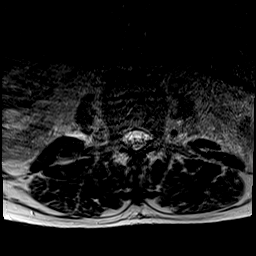
[im 35/42]
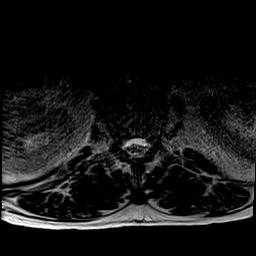
[im 42/42]
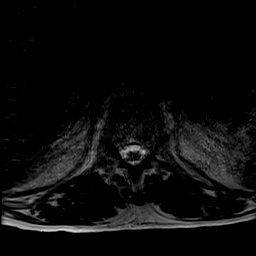

[Series 8: T2 · axial · 4.0mm · 0.78mm/px · z∈[-21,+221]mm · 7 of 42 slices shown (4 of 4)]
[im 1/42]
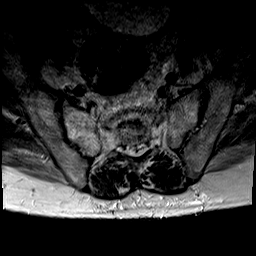
[im 7/42]
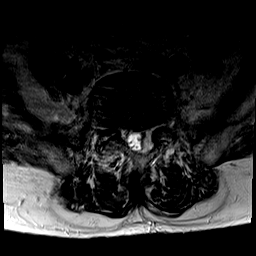
[im 14/42]
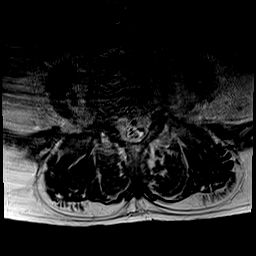
[im 21/42]
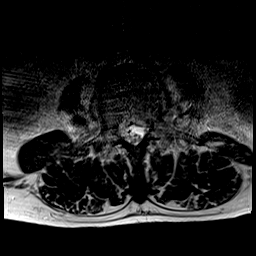
[im 28/42]
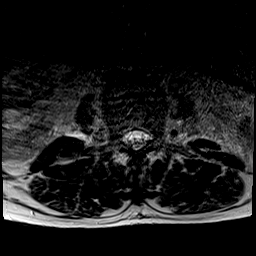
[im 35/42]
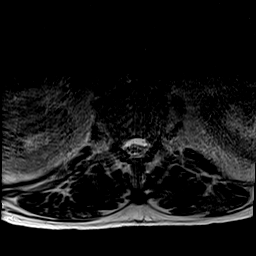
[im 42/42]
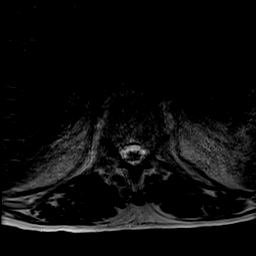

[Series 9: T1 · axial · 4.0mm · 0.39mm/px · z∈[-21,+221]mm · 7 of 42 slices shown (3 of 4)]
[im 1/42]
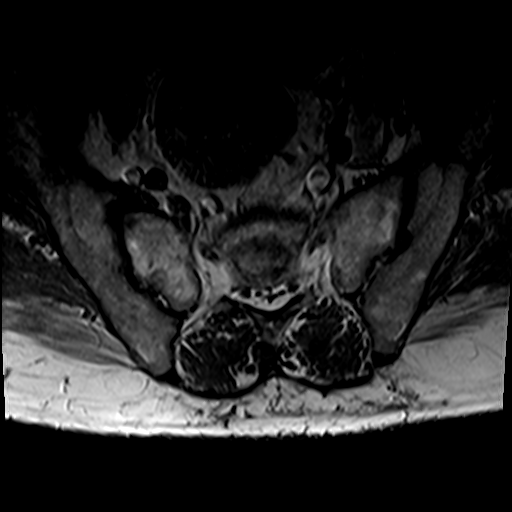
[im 7/42]
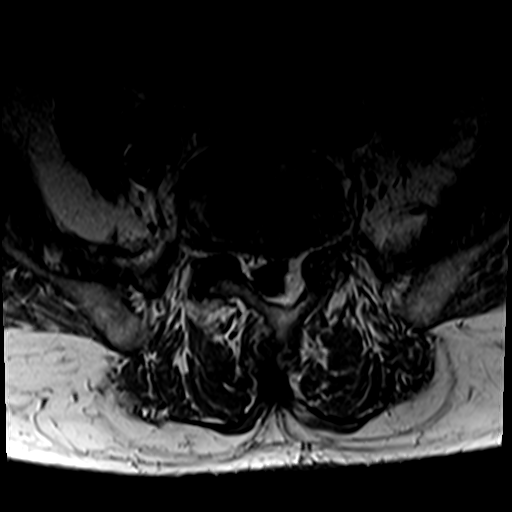
[im 14/42]
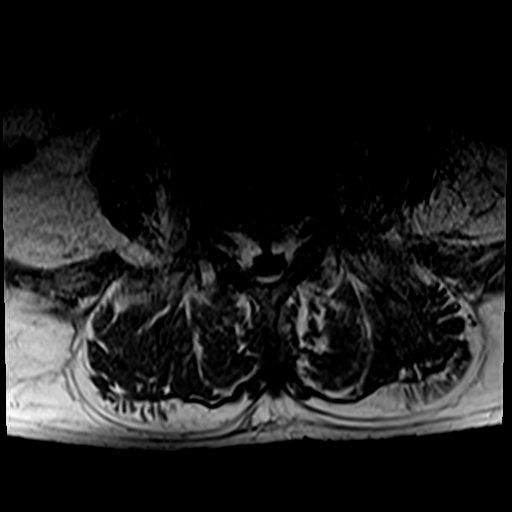
[im 21/42]
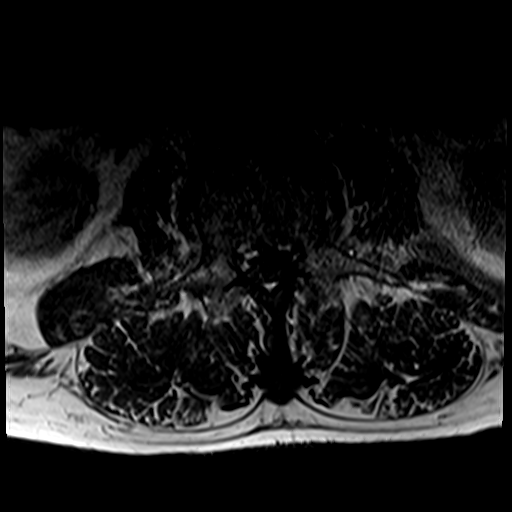
[im 28/42]
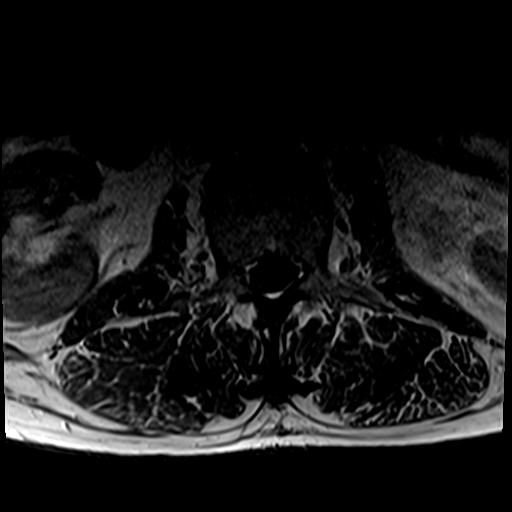
[im 35/42]
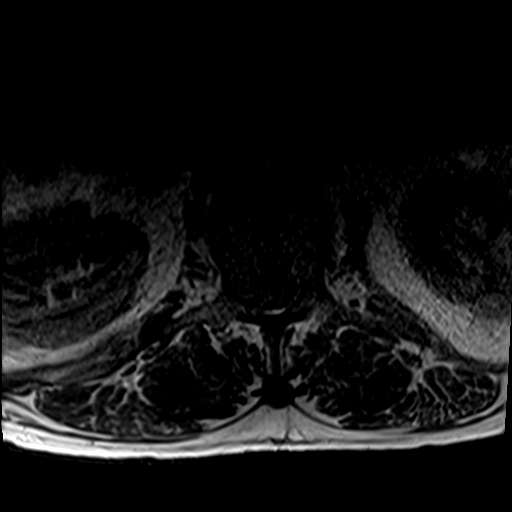
[im 42/42]
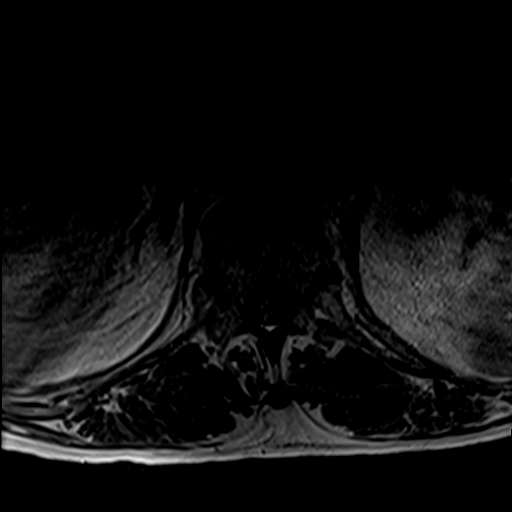

[Series 9: T1 · axial · 4.0mm · 0.39mm/px · 1 of 42 slices shown (4 of 4)]
[im 1/42]
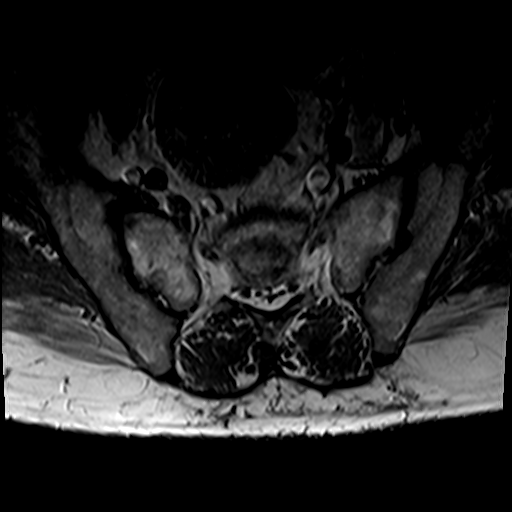

[36 of 48 positions shown; findings below may reference images not displayed]

FINDINGS: MRI THORACIC SPINE FINDINGS

Alignment:  Normal

Vertebrae: Mild compression deformity of T12 with mild edema
underlying the superior endplate. The other vertebral bodies are
normal.

Cord:  Normal

Paraspinal and other soft tissues: Small pleural effusions

Disc levels:

No spinal canal stenosis.

MRI LUMBAR SPINE FINDINGS

Segmentation:  Standard.

Alignment:  Physiologic.

Vertebrae: Mild compression deformity of L1 and severe compression
deformity of L4. Mild superior L1 endplate edema. Moderate diffuse
L4 edema. At L4, there is 4 mm of retropulsion.

Conus medullaris and cauda equina: Conus extends to the L2 level.
Conus and cauda equina appear normal.

Paraspinal and other soft tissues: Negative

Disc levels:

L1-L2: Normal disc space and facet joints. No spinal canal stenosis.
No neural foraminal stenosis.

L2-L3: Normal disc space and facet joints. No spinal canal stenosis.
No neural foraminal stenosis.

L3-L4: Retropulsion causes severe spinal canal stenosis. No neural
foraminal stenosis.

L4-L5: Right asymmetric disc bulge with right facet hypertrophy.
Right spinal canal stenosis. Mild bilateral neural foraminal
stenosis.

L5-S1: Normal disc space and facet joints. No spinal canal stenosis.
No neural foraminal stenosis.

Visualized sacrum: Normal.
IMPRESSION: 1. Acute to subacute compression fractures of T12, L1 and L4
2. Retropulsion of the L4 fracture causes severe spinal canal
stenosis.
3. Small pleural effusions.

## 2021-08-18 IMAGING — CT CT HIP*L* W/O CM
2 of 6 series · 13 of 46 positions shown, 18 images · non-contrast
Comparison: X-ray [DATE]

CLINICAL DATA: Left hip pain

EXAM:
CT OF THE LEFT HIP WITHOUT CONTRAST
TECHNIQUE: Multidetector CT imaging of the left hip was performed according to
the standard protocol. Multiplanar CT image reconstructions were
also generated.
RADIATION DOSE REDUCTION: This exam was performed according to the
departmental dose-optimization program which includes automated
exposure control, adjustment of the mA and/or kV according to
patient size and/or use of iterative reconstruction technique.

[Series 3: axial st · axial · 0.52mm/px · z∈[-1100,-904]mm · 10 of 114 slices shown, 15 images]
[im 8/114  soft-tissue]
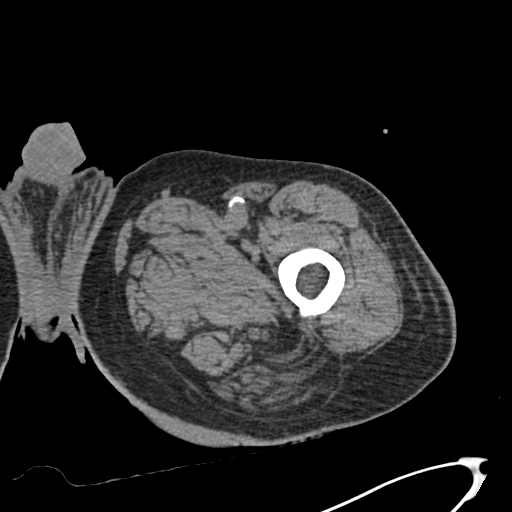
[im 8/114  bone]
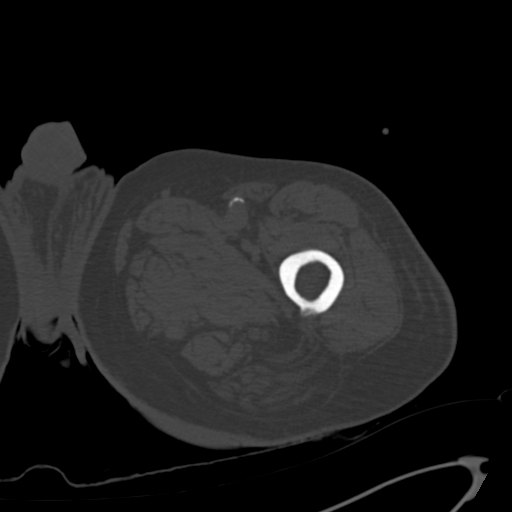
[im 23/114  soft-tissue]
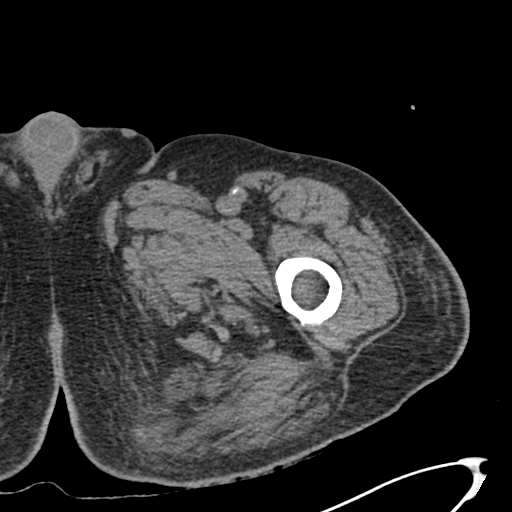
[im 31/114  soft-tissue]
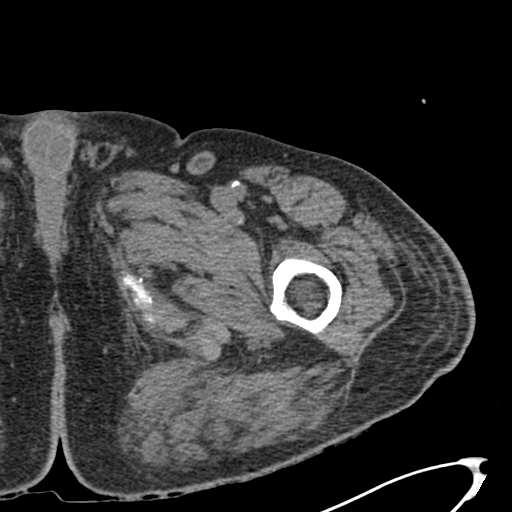
[im 46/114  soft-tissue]
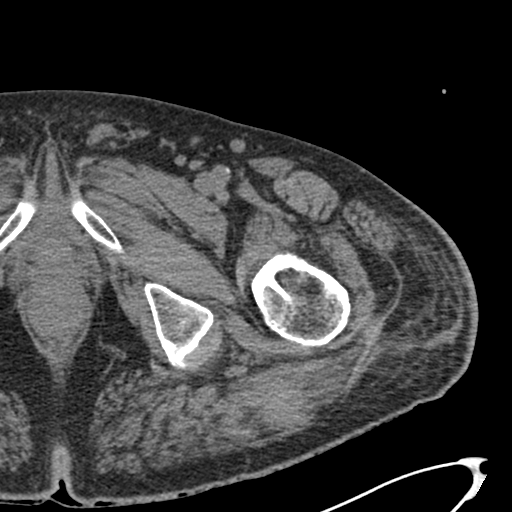
[im 61/114  soft-tissue]
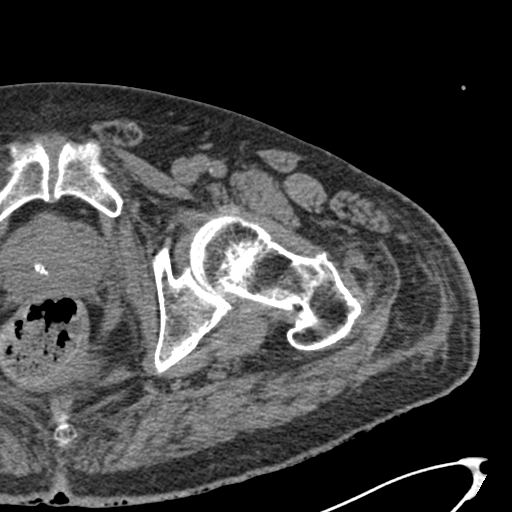
[im 68/114  soft-tissue]
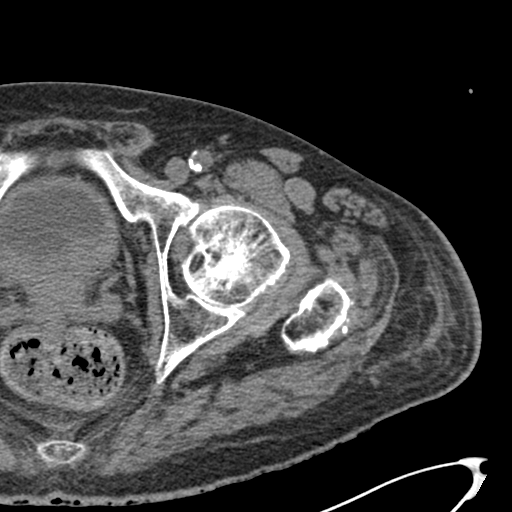
[im 83/114  soft-tissue]
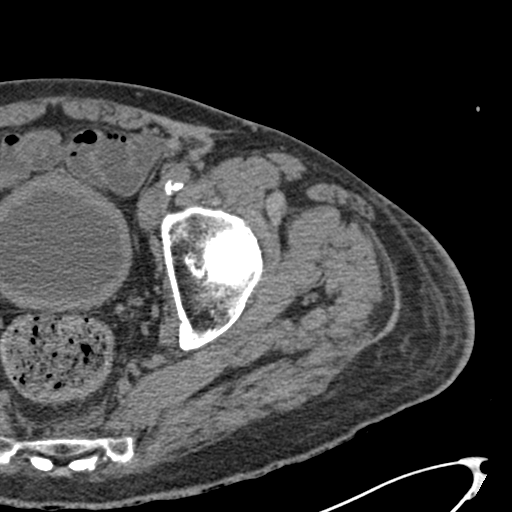
[im 83/114  lung]
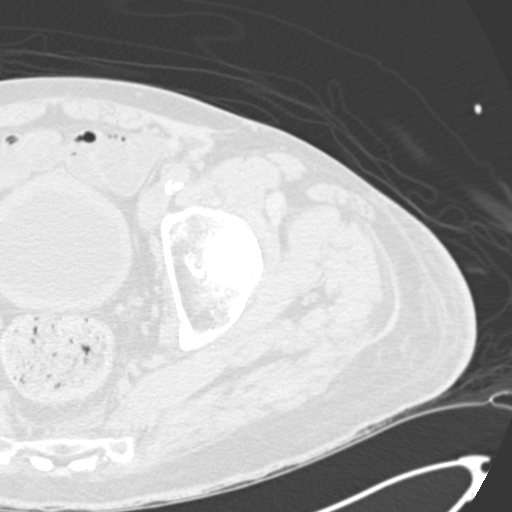
[im 91/114  soft-tissue]
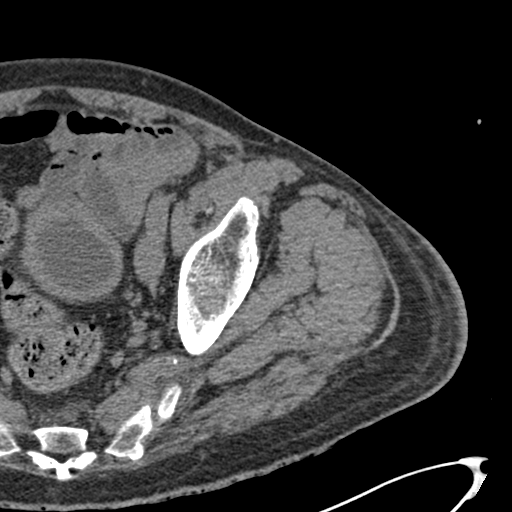
[im 91/114  lung]
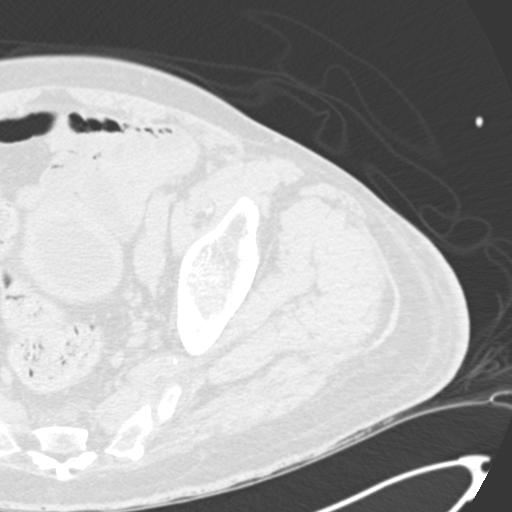
[im 98/114  lung]
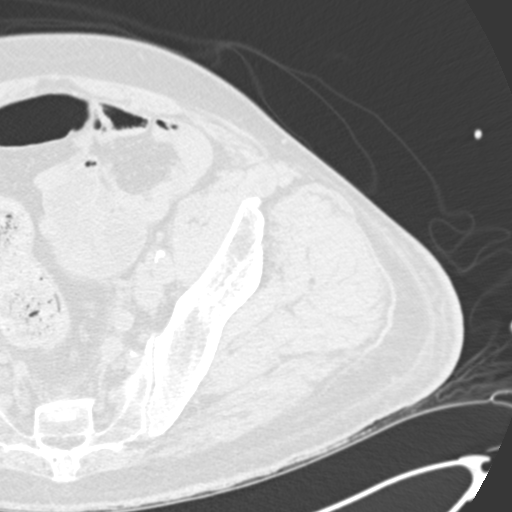
[im 106/114  soft-tissue]
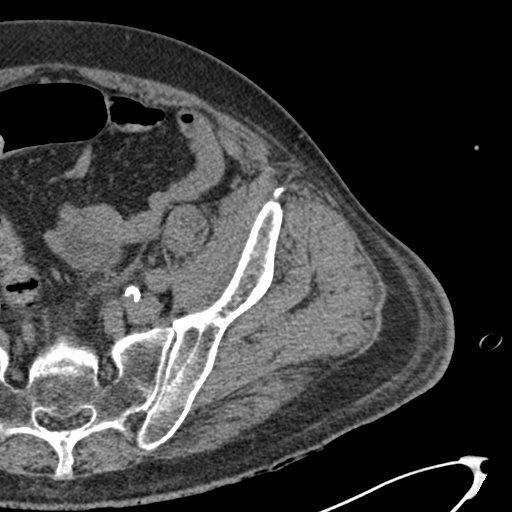
[im 106/114  lung]
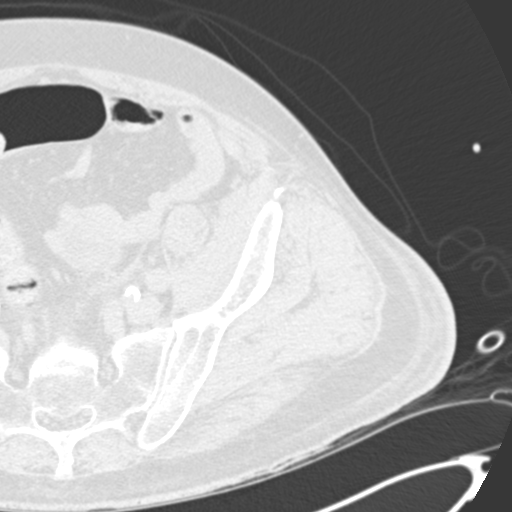
[im 106/114  bone]
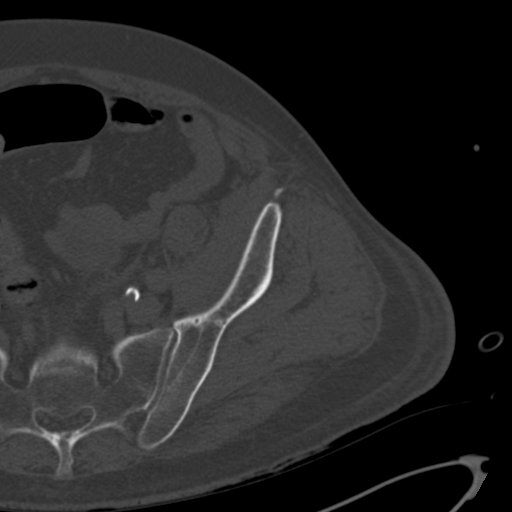

[Series 5: coronal st · coronal · 0.42mm/px · 3 of 105 slices shown]
[im 27/105  soft-tissue]
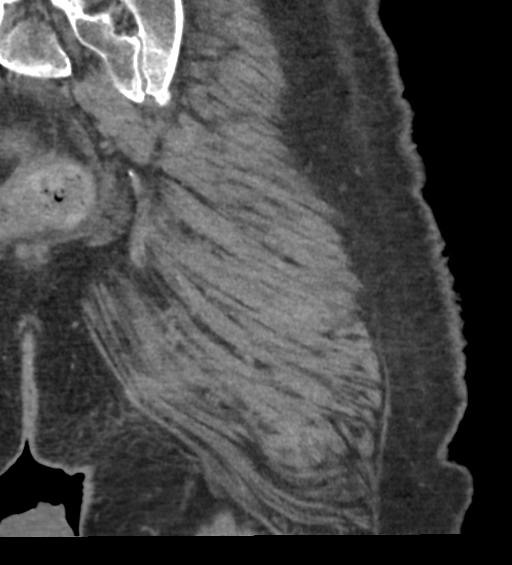
[im 53/105  soft-tissue]
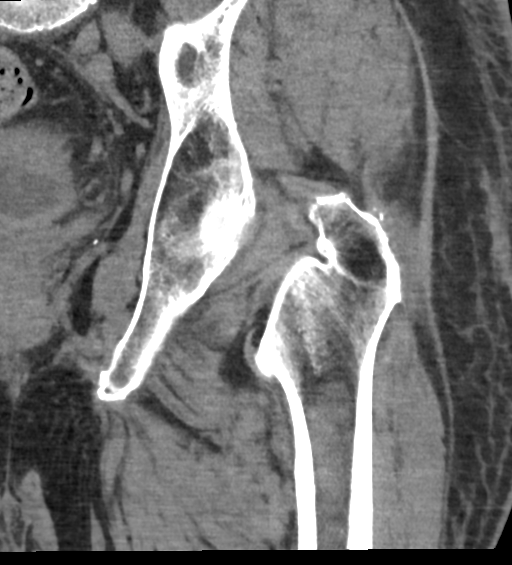
[im 79/105  soft-tissue]
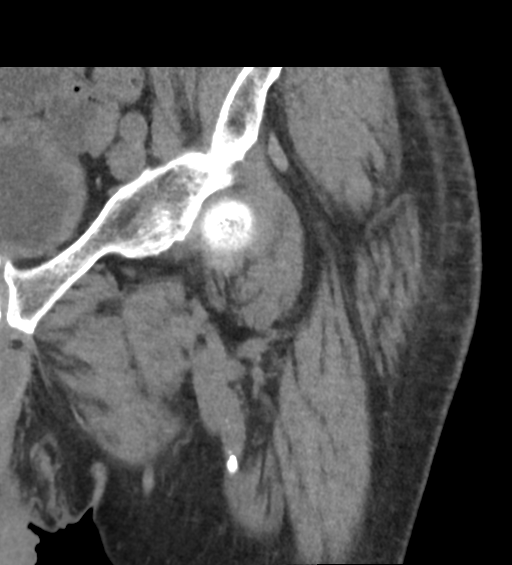

[13 of 46 positions shown; findings below may reference images not displayed]

FINDINGS: Bones/Joint/Cartilage

Slight cortical discontinuity along the superior surface of the left
femoral neck seen on coronal sequence (series 4, image 46). No
discernible intramedullary fracture line. Otherwise, no evidence of
fracture. No dislocation. Minimal left hip joint space narrowing.
Mild enthesopathic changes at the greater trochanter and ischial
tuberosity. No evidence of femoral head avascular necrosis.
Visualized portion of the left hemipelvis appears intact and
unremarkable. No suspicious lytic or sclerotic bone lesion.

Ligaments

Suboptimally assessed by CT.

Muscles and Tendons

No acute musculotendinous abnormality by CT.

Soft tissues

Subcutaneous edema with a small amount of ill-defined fluid within
the soft tissues overlying the lateral aspect of the hip. No
organized fluid collection. Circumferential urinary bladder wall
thickening. Trace presacral fluid, nonspecific. No left inguinal
lymphadenopathy. Atherosclerotic vascular calcifications are
present.
IMPRESSION: 1. Slight cortical discontinuity along the superior surface of the
left femoral neck, which may be artifactual or represent a
nondisplaced fracture. Further evaluation with MRI of the left hip
is recommended.
2. Subcutaneous edema with a small amount of ill-defined fluid
within the soft tissues overlying the lateral aspect of the hip,
which may represent sequela of recent trauma. No organized fluid
collection.
3. Circumferential urinary bladder wall thickening, which may
represent cystitis. Correlate with urinalysis.

## 2021-08-18 MED ORDER — ADULT MULTIVITAMIN W/MINERALS CH
1.0000 | ORAL_TABLET | Freq: Every day | ORAL | Status: DC
  Administered 2021-08-18 – 2021-08-24 (×5): 1 via ORAL
  Filled 2021-08-18 (×5): qty 1

## 2021-08-18 MED ORDER — CLONAZEPAM 0.5 MG PO TABS
0.5000 mg | ORAL_TABLET | Freq: Once | ORAL | Status: AC
  Administered 2021-08-18: 0.5 mg via ORAL
  Filled 2021-08-18: qty 1

## 2021-08-18 MED ORDER — THIAMINE HCL 100 MG/ML IJ SOLN
100.0000 mg | Freq: Every day | INTRAMUSCULAR | Status: DC
  Administered 2021-08-19: 100 mg via INTRAVENOUS
  Filled 2021-08-18: qty 2

## 2021-08-18 MED ORDER — THIAMINE HCL 100 MG PO TABS
100.0000 mg | ORAL_TABLET | Freq: Every day | ORAL | Status: DC
  Administered 2021-08-20 – 2021-08-24 (×4): 100 mg via ORAL
  Filled 2021-08-18 (×5): qty 1

## 2021-08-18 MED ORDER — FOLIC ACID 1 MG PO TABS
1.0000 mg | ORAL_TABLET | Freq: Every day | ORAL | Status: DC
  Administered 2021-08-18 – 2021-08-24 (×6): 1 mg via ORAL
  Filled 2021-08-18 (×6): qty 1

## 2021-08-18 MED ORDER — LORAZEPAM 1 MG PO TABS
1.0000 mg | ORAL_TABLET | ORAL | Status: AC | PRN
  Administered 2021-08-19 – 2021-08-20 (×2): 2 mg via ORAL
  Administered 2021-08-20: 1 mg via ORAL
  Administered 2021-08-21: 3 mg via ORAL
  Filled 2021-08-18 (×2): qty 2
  Filled 2021-08-18: qty 3
  Filled 2021-08-18: qty 2

## 2021-08-18 MED ORDER — PERFLUTREN LIPID MICROSPHERE
1.0000 mL | INTRAVENOUS | Status: AC | PRN
  Administered 2021-08-18: 3 mL via INTRAVENOUS
  Filled 2021-08-18: qty 10

## 2021-08-18 MED ORDER — LORAZEPAM 2 MG/ML IJ SOLN
1.0000 mg | INTRAMUSCULAR | Status: AC | PRN
  Administered 2021-08-18: 3 mg via INTRAVENOUS
  Administered 2021-08-20 (×3): 2 mg via INTRAVENOUS
  Filled 2021-08-18: qty 1
  Filled 2021-08-18: qty 2
  Filled 2021-08-18 (×2): qty 1

## 2021-08-18 NOTE — Progress Notes (Signed)
Cross Cover Additional 0.5 mg klonopin ordered secondary to patient report of anxiety and requesting med.

## 2021-08-18 NOTE — Progress Notes (Addendum)
PROGRESS NOTE    Lance House  NKN:[id number] DOB: Jun 28, 1951 DOA: 08/15/2021 PCP: [facility] [place]   Chief complaint.  Back pain. Brief Narrative:  Lance House is a 71 y.o. Caucasian male with medical history significant for anxiety, depression and gout who presented to the emergency room with acute onset of generalized weakness and a fall in the kitchen in front of the refrigerator.  Patient had a sodium of 112, CK of 5975, CT showed 9 mm intracranial hemorrhage. Patient was seen by physical therapy, had a severe back pain.  CT scan showed T12-L1 for compression fracture.  Assessment & Plan:   Principal Problem:   ICH (intracerebral hemorrhage) (HCC) Active Problems:   Hyponatremia   Hypokalemia   Rhabdomyolysis  Severe back pain and the left hip pain. Compression fracture between T12-L4. Patient had a severe back pain in the left hip pain when working with physical therapy.  I ordered a CT scan, showed compression fractures between T12-L4.  CT scan of the left hip also suspected line fracture.  Discussed with Dr. Belenda Cruise, from neurosurgery, obtain MRI of the lumbar spine.  I will also order MRI of the left hip. Patient replaced on TLSO brace.  Intracranial hemorrhage. Patient evaluated by neurosurgery, stable, no indication for surgery.  Severe symptomatic hyponatremia secondary to beer potomania. Hypokalemia. Traumatic rhabdomyolysis secondary to fall. Elevated troponin secondary to rhabdomyolysis. Sodium gradually improved to 130, CK level still elevated at 1750, troponin had dropped down.  I will obtain echocardiogram to evaluate ejection fraction and focal motion abnormality. I will continue normal saline, patient has no evidence of volume overload.    Hypertension urgency. Continue current treatment.  Alcohol abuse. Alcoholic hepatitis. Continue CIWA protocol.     DVT prophylaxis: SCDs Code Status: full Family Communication:  Disposition Plan:  Patient may need nursing home placement.  Status is: Inpatient   Remains inpatient appropriate because: Due to severity of disease.        I/O last 3 completed shifts: In: 1603 [P.O.:960; I.V.:443; IV Piggyback:200] Out: 2175 [Urine:2175] Total I/O In: 970.1 [I.V.:970.1] Out: -      Consultants:  Neurosurgery.  Procedures: None  Antimicrobials: None  Subjective: Patient still complaining of severe lower back pain with minimal exertion.  Still has some left hip pain.  The pain was worse yesterday with physical therapy.  But the patient was able to bear weight on left foot. Denies any short of breath or cough. No nausea vomiting abdominal pain.  Objective: Vitals:   08/17/21 1949 08/17/21 2354 08/18/21 0439 08/18/21 0827  BP: (!) 133/91 139/84 (!) 145/101 (!) 152/88  Pulse: 98 84 87 (!) 110  Resp: 18 19 16 18   Temp: 97.6 F (36.4 C) 98.5 F (36.9 C) 98.7 F (37.1 C) 97.9 F (36.6 C)  TempSrc: Oral Oral Oral Oral  SpO2: 96% 94% 96% 91%  Weight:      Height:        Intake/Output Summary (Last 24 hours) at 08/18/2021 1014 Last data filed at 08/18/2021 0951 Gross per 24 hour  Intake 2093.1 ml  Output 1700 ml  Net 393.1 ml   Filed Weights   08/15/21 0537  Weight: 86.2 kg    Examination:  General exam: Appears calm and comfortable  Respiratory system: Clear to auscultation. Respiratory effort normal. Cardiovascular system: S1 & S2 heard, RRR. No JVD, murmurs, rubs, gallops or clicks. No pedal edema. Gastrointestinal system: Abdomen is nondistended, soft and nontender. No organomegaly or masses felt. Normal bowel sounds  heard. Central nervous system: Alert and oriented x3. No focal neurological deficits. Extremities: Symmetric 5 x 5 power. Skin: No rashes, lesions or ulcers Psychiatry: Judgement and insight appear normal. Mood & affect appropriate.     Data Reviewed: I have personally reviewed following labs and imaging studies  CBC: Recent Labs  Lab  08/15/21 0543 08/16/21 0710 08/17/21 0253 08/18/21 0504  WBC 14.8* 7.8 10.0 7.4  NEUTROABS 13.3*  --  7.5 5.3  HGB 15.3 13.9 13.1 13.7  HCT 41.3 39.0 36.5* 39.0  MCV 88.8 92.4 91.7 94.4  PLT 181 153 171 590   Basic Metabolic Panel: Recent Labs  Lab 08/15/21 0543 08/16/21 0710 08/16/21 0858 08/16/21 1438 08/16/21 2219 08/17/21 0253 08/17/21 0639 08/17/21 1648 08/18/21 0504  NA 112* 123* 124*   < > 125* 124* 127* 127* 130*  K 3.7 2.9*  --   --   --  3.1*  --   --  3.8  CL 79* 91*  --   --   --  94*  --   --  98  CO2 21* 21*  --   --   --  24  --   --  25  GLUCOSE 92 62*  --   --   --  96  --   --  91  BUN <5* <5*  --   --   --  <5*  --   --  <5*  CREATININE 0.37* 0.38*  --   --   --  0.40*  --   --  0.44*  CALCIUM 8.2* 7.8*  --   --   --  7.8*  --   --  8.3*  MG  --   --  1.9  --   --  1.9  --   --  1.9  PHOS  --   --   --   --   --  2.6  --   --   --    < > = values in this interval not displayed.   GFR: Estimated Creatinine Clearance: 99.9 mL/min (A) (by C-G formula based on SCr of 0.44 mg/dL (L)). Liver Function Tests: Recent Labs  Lab 08/15/21 0543  AST 144*  ALT 48*  ALKPHOS 93  BILITOT 2.2*  PROT 6.7  ALBUMIN 3.3*   Recent Labs  Lab 08/15/21 0543  LIPASE 24   No results for input(s): AMMONIA in the last 168 hours. Coagulation Profile: No results for input(s): INR, PROTIME in the last 168 hours. Cardiac Enzymes: Recent Labs  Lab 08/15/21 0543 08/15/21 1845 08/16/21 0710 08/17/21 0253 08/18/21 0504  CKTOTAL 5,975* 3,059* 1,355* 1,507* 1,750*   BNP (last 3 results) No results for input(s): PROBNP in the last 8760 hours. HbA1C: No results for input(s): HGBA1C in the last 72 hours. CBG: No results for input(s): GLUCAP in the last 168 hours. Lipid Profile: No results for input(s): CHOL, HDL, LDLCALC, TRIG, CHOLHDL, LDLDIRECT in the last 72 hours. Thyroid Function Tests: Recent Labs    08/15/21 1845  TSH 1.319   Anemia Panel: No results  for input(s): VITAMINB12, FOLATE, FERRITIN, TIBC, IRON, RETICCTPCT in the last 72 hours. Sepsis Labs: No results for input(s): PROCALCITON, LATICACIDVEN in the last 168 hours.  Recent Results (from the past 240 hour(s))  Resp Panel by RT-PCR (Flu A&B, Covid) Nasopharyngeal Swab     Status: None   Collection Time: 08/15/21  5:43 AM   Specimen: Nasopharyngeal Swab; Nasopharyngeal(NP) swabs in vial transport medium  Result Value Ref Range Status   SARS Coronavirus 2 by RT PCR NEGATIVE NEGATIVE Final    Comment: (NOTE) SARS-CoV-2 target nucleic acids are NOT DETECTED.  The SARS-CoV-2 RNA is generally detectable in upper respiratory specimens during the acute phase of infection. The lowest concentration of SARS-CoV-2 viral copies this assay can detect is 138 copies/mL. A negative result does not preclude SARS-Cov-2 infection and should not be used as the sole basis for treatment or other patient management decisions. A negative result may occur with  improper specimen collection/handling, submission of specimen other than nasopharyngeal swab, presence of viral mutation(s) within the areas targeted by this assay, and inadequate number of viral copies(<138 copies/mL). A negative result must be combined with clinical observations, patient history, and epidemiological information. The expected result is Negative.  Fact Sheet for Patients:  EntrepreneurPulse.com.au  Fact Sheet for Healthcare Providers:  IncredibleEmployment.be  This test is no t yet approved or cleared by the [place] FDA and  has been authorized for detection and/or diagnosis of SARS-CoV-2 by FDA under an Emergency Use Authorization (EUA). This EUA will remain  in effect (meaning this test can be used) for the duration of the COVID-19 declaration under Section 564(b)(1) of the Act, 21 U.S.C.section 360bbb-3(b)(1), unless the authorization is terminated  or revoked sooner.        Influenza A by PCR NEGATIVE NEGATIVE Final   Influenza B by PCR NEGATIVE NEGATIVE Final    Comment: (NOTE) The Xpert Xpress SARS-CoV-2/FLU/RSV plus assay is intended as an aid in the diagnosis of influenza from Nasopharyngeal swab specimens and should not be used as a sole basis for treatment. Nasal washings and aspirates are unacceptable for Xpert Xpress SARS-CoV-2/FLU/RSV testing.  Fact Sheet for Patients: EntrepreneurPulse.com.au  Fact Sheet for Healthcare Providers: IncredibleEmployment.be  This test is not yet approved or cleared by the [place] FDA and has been authorized for detection and/or diagnosis of SARS-CoV-2 by FDA under an Emergency Use Authorization (EUA). This EUA will remain in effect (meaning this test can be used) for the duration of the COVID-19 declaration under Section 564(b)(1) of the Act, 21 U.S.C. section 360bbb-3(b)(1), unless the authorization is terminated or revoked.  Performed at [facility], [address] [place], [place] [address]          Radiology Studies: CT LUMBAR SPINE WO CONTRAST  Result Date: 08/18/2021 CLINICAL DATA:  Multiple falls, back pain EXAM: CT LUMBAR SPINE WITHOUT CONTRAST TECHNIQUE: Multidetector CT imaging of the lumbar spine was performed without intravenous contrast administration. Multiplanar CT image reconstructions were also generated. RADIATION DOSE REDUCTION: This exam was performed according to the departmental dose-optimization program which includes automated exposure control, adjustment of the mA and/or kV according to patient size and/or use of iterative reconstruction technique. COMPARISON:  CT abdomen and pelvis 04/08/2019 FINDINGS: Segmentation: 5 lumbar type vertebrae. Alignment: Normal. Vertebrae: There are acute appearing superior endplate compression fracture deformities of T12 and L1 with 20-25% height loss centrally. Approximally 2 mm bulging bony  retropulsion near the superior endplate of L1. Severe burst type compression fracture deformity of L4 with fracture extension through the posterior cortex, with complete height loss centrally and up to 4 mm bulging bony retropulsion at the superior endplate. Paraspinal and other soft tissues: No acute paraspinal soft tissue abnormality identified. Partially visualized densities in the bilateral lower lobes left-greater-than-right. Disc levels: Limited evaluation with CT. L1-L2: Facet and ligamentum flavum hypertrophy. Mild diffuse disc bulge. No significant spinal canal stenosis or neural foraminal  narrowing. L2-L3: Facet and ligamentum flavum hypertrophy. Mild to moderate diffuse disc bulge slightly eccentric to the right. No significant spinal canal stenosis or neural foraminal narrowing. L3-L4: Facet and ligamentum flavum hypertrophy. Diffuse disc bulge and prominent posterior epidural fat. Mild-to-moderate spinal canal stenosis and bilateral neural foraminal narrowing. L4-L5: Facet and ligamentum flavum hypertrophy. Moderate diffuse disc bulge. Likely mild spinal canal stenosis and moderate bilateral neural foraminal narrowing. L5-S1: Facet and ligamentum flavum hypertrophy. Diffuse disc bulge. No significant spinal canal stenosis. Mild bilateral neural foraminal narrowing. IMPRESSION: 1. Acute/recent appearing compression fractures of T12, L1 and most severely L4 as described. 2. Degenerative changes of the lumbar spine. 3. Densities in the partially visualized bilateral lower lobes left greater than right, possible significant atelectasis of the left lower lobe. Electronically Signed   By: Ofilia Neas M.D.   On: 08/18/2021 09:20   CT HIP LEFT WO CONTRAST  Result Date: 08/18/2021 CLINICAL DATA:  Left hip pain EXAM: CT OF THE LEFT HIP WITHOUT CONTRAST TECHNIQUE: Multidetector CT imaging of the left hip was performed according to the standard protocol. Multiplanar CT image reconstructions were also  generated. RADIATION DOSE REDUCTION: This exam was performed according to the departmental dose-optimization program which includes automated exposure control, adjustment of the mA and/or kV according to patient size and/or use of iterative reconstruction technique. COMPARISON:  X-ray 08/15/2021 FINDINGS: Bones/Joint/Cartilage Slight cortical discontinuity along the superior surface of the left femoral neck seen on coronal sequence (series 4, image 46). No discernible intramedullary fracture line. Otherwise, no evidence of fracture. No dislocation. Minimal left hip joint space narrowing. Mild enthesopathic changes at the greater trochanter and ischial tuberosity. No evidence of femoral head avascular necrosis. Visualized portion of the left hemipelvis appears intact and unremarkable. No suspicious lytic or sclerotic bone lesion. Ligaments Suboptimally assessed by CT. Muscles and Tendons No acute musculotendinous abnormality by CT. Soft tissues Subcutaneous edema with a small amount of ill-defined fluid within the soft tissues overlying the lateral aspect of the hip. No organized fluid collection. Circumferential urinary bladder wall thickening. Trace presacral fluid, nonspecific. No left inguinal lymphadenopathy. Atherosclerotic vascular calcifications are present. IMPRESSION: 1. Slight cortical discontinuity along the superior surface of the left femoral neck, which may be artifactual or represent a nondisplaced fracture. Further evaluation with MRI of the left hip is recommended. 2. Subcutaneous edema with a small amount of ill-defined fluid within the soft tissues overlying the lateral aspect of the hip, which may represent sequela of recent trauma. No organized fluid collection. 3. Circumferential urinary bladder wall thickening, which may represent cystitis. Correlate with urinalysis. Electronically Signed   By: Davina Poke D.O.   On: 08/18/2021 09:15        Scheduled Meds:  allopurinol  300 mg Oral  Daily   folic acid  1 mg Oral Daily   multivitamin with minerals  1 tablet Oral Daily   omega-3 acid ethyl esters   Oral Daily   senna-docusate  2 tablet Oral BID   thiamine  100 mg Oral Daily   Or   thiamine  100 mg Intravenous Daily   thiamine  100 mg Oral Daily   [START ON 08/19/2021] Vitamin D (Ergocalciferol)  50,000 Units Oral Weekly   Continuous Infusions:  0.9 % NaCl with KCl 20 mEq / L 100 mL/hr at 08/18/21 0951     LOS: 3 days    Time spent: 34 minutes    Sharen Hones, MD [facility]   To contact the attending provider between  7A-7P or the covering provider during after hours 7P-7A, please log into the web site [email or web address] and access using universal [place] password for that web site. If you do not have the password, please call the hospital operator.  08/18/2021, 10:14 AM

## 2021-08-18 NOTE — Progress Notes (Signed)
*  PRELIMINARY RESULTS* Echocardiogram 2D Echocardiogram has been performed.  Lance House Lance House Lance House 08/18/2021, 1:58 PM

## 2021-08-18 NOTE — Care Management Important Message (Signed)
Important Message  Patient Details  Name: Lance House MRN: [id number] Date of Birth: Nov 23, 1950   Medicare Important Message Given:  Yes     Dannette Barbara 08/18/2021, 1:02 PM

## 2021-08-18 NOTE — Progress Notes (Signed)
Was contacted by the hospitalist service regarding this patient.  He had presented yesterday after fall found on CT scan of the head to have a small amount of intraventricular blood in the right lateral ventricle stable and otherwise neurologically intact.  Was contacted again this morning because the patient had demonstrated back pain which was pain limiting on ambulation and mobilization found on CT scan imaging to have multiple compression fractures of the thoracic and lumbar spine.  The hospitalist team is informed us that she is moving his lower extremities grossly full strength and sensation and did not express any concerns around bowel or bladder function.  CT scan of the lumbar spine is notable for L4 compression fracture with greater than 50% loss of height no substantial retropulsion into the canal and no obvious severe compression of the spinal canal contents.  There is also recent appearing compression fractures of T12 and L1.  There is a straightening of the normal lordosis of the spine.  The spinal canal is otherwise patent on CT scan imaging.  I have asked that the patient be kept in a TLSO brace when mobilized or out of bed.  I have also asked that MRI of the thoracic and lumbar spine be obtained which they are doing.  We will review these films when they are available  Luciano Cutter. Johnney Killian, MD Neurosurgery

## 2021-08-18 NOTE — Progress Notes (Signed)
Per wife Caren Griffins, patient drinks at least 24 cans of beer a day.

## 2021-08-19 LAB — CBC WITH DIFFERENTIAL/PLATELET
Abs Immature Granulocytes: 0.05 10*3/uL (ref 0.00–0.07)
Basophils Absolute: 0.1 10*3/uL (ref 0.0–0.1)
Basophils Relative: 1 %
Eosinophils Absolute: 0.2 10*3/uL (ref 0.0–0.5)
Eosinophils Relative: 3 %
HCT: 42.1 % (ref 39.0–52.0)
Hemoglobin: 14.3 g/dL (ref 13.0–17.0)
Immature Granulocytes: 1 %
Lymphocytes Relative: 15 %
Lymphs Abs: 0.9 10*3/uL (ref 0.7–4.0)
MCH: 32.6 pg (ref 26.0–34.0)
MCHC: 34 g/dL (ref 30.0–36.0)
MCV: 96.1 fL (ref 80.0–100.0)
Monocytes Absolute: 0.7 10*3/uL (ref 0.1–1.0)
Monocytes Relative: 12 %
Neutro Abs: 4.5 10*3/uL (ref 1.7–7.7)
Neutrophils Relative %: 68 %
Platelets: 212 10*3/uL (ref 150–400)
RBC: 4.38 MIL/uL (ref 4.22–5.81)
RDW: 13.9 % (ref 11.5–15.5)
WBC: 6.4 10*3/uL (ref 4.0–10.5)
nRBC: 0 % (ref 0.0–0.2)

## 2021-08-19 LAB — BASIC METABOLIC PANEL
Anion gap: 8 (ref 5–15)
BUN: <5 mg/dL — ABNORMAL LOW (ref 8–23)
CO2: 25 mmol/L (ref 22–32)
Calcium: 8.6 mg/dL — ABNORMAL LOW (ref 8.9–10.3)
Chloride: 99 mmol/L (ref 98–111)
Creatinine, Ser: 0.53 mg/dL — ABNORMAL LOW (ref 0.61–1.24)
GFR, Estimated: >60 mL/min (ref >60)
Glucose, Bld: 91 mg/dL (ref 70–99)
Potassium: 3.7 mmol/L (ref 3.5–5.1)
Sodium: 132 mmol/L — ABNORMAL LOW (ref 135–145)

## 2021-08-19 LAB — CK: Total CK: 1882 U/L — ABNORMAL HIGH (ref 49–397)

## 2021-08-19 LAB — TROPONIN I (HIGH SENSITIVITY): Troponin I (High Sensitivity): 40 ng/L — ABNORMAL HIGH (ref <18)

## 2021-08-19 LAB — PHOSPHORUS: Phosphorus: 3.5 mg/dL (ref 2.5–4.6)

## 2021-08-19 LAB — MAGNESIUM: Magnesium: 1.9 mg/dL (ref 1.7–2.4)

## 2021-08-19 MED ORDER — FUROSEMIDE 10 MG/ML IJ SOLN
40.0000 mg | Freq: Once | INTRAMUSCULAR | Status: AC
  Administered 2021-08-19: 40 mg via INTRAVENOUS
  Filled 2021-08-19: qty 4

## 2021-08-19 NOTE — Progress Notes (Signed)
PT Cancellation Note  Patient Details Name: Lance House MRN: [id number] DOB: August 13, 1950   Cancelled Treatment:    Reason Eval/Treat Not Completed: Other (comment). Pt without TLSO, PT called ortho tech # to relay information to obtain TLSO. PT to re-attempt as able. Of note, pt also pending MRI results of R hip to check for hip fracture.   Lieutenant Diego PT, DPT 10:05 AM,08/19/21

## 2021-08-19 NOTE — TOC Initial Note (Signed)
Transition of Care [facility]) - Initial/Assessment Note    Patient Details  Name: Lance House MRN: [id number] Date of Birth: Mar 31, 1951  Transition of Care [facility]) CM/SW Contact:    Boris Sharper, LCSW Phone Number: 08/19/2021, 4:56 PM  Clinical Narrative:                 Pt agreed that he needed rehab after his hospital admission but was concerned about having back surgery because he is unable to walk. CSW explained that TOC works to get his discharge plans set up and that it is a process. Pt verbalized understanding and did not have a preference in facilities at this time. CSW completed FL2 and started PASRR but unable to submit additional info requested due to connectivity issues. TOC please submit info for PASRR review. Once PASRR in please fax FL2 to surrounding facilities.   Expected Discharge Plan: Skilled Nursing Facility Barriers to Discharge: Continued Medical Work up   Patient Goals and CMS Choice Patient states their goals for this hospitalization and ongoing recovery are:: to get his back surgery and recover CMS Medicare.gov Compare Post Acute Care list provided to:: Patient Choice offered to / list presented to : Patient  Expected Discharge Plan and Services Expected Discharge Plan: [place] Choice: [place] Living arrangements for the past 2 months: Mobile Home                                      Prior Living Arrangements/Services Living arrangements for the past 2 months: Mobile Home Lives with:: Spouse Patient language and need for interpreter reviewed:: Yes        Need for Family Participation in Patient Care: Yes (Comment) Care giver support system in place?: Yes (comment)   Criminal Activity/Legal Involvement Pertinent to Current Situation/Hospitalization: No - Comment as needed  Activities of Daily Living Home Assistive Devices/Equipment: None ADL Screening (condition at time of  admission) Patient's cognitive ability adequate to safely complete daily activities?: No Is the patient deaf or have difficulty hearing?: No Does the patient have difficulty seeing, even when wearing glasses/contacts?: No Does the patient have difficulty concentrating, remembering, or making decisions?: No Patient able to express need for assistance with ADLs?: Yes Does the patient have difficulty dressing or bathing?: No Independently performs ADLs?: Yes (appropriate for developmental age) Does the patient have difficulty walking or climbing stairs?: Yes Weakness of Legs: Both Weakness of Arms/Hands: None  Permission Sought/Granted Permission sought to share information with : Facility Art therapist granted to share information with : Yes, Verbal Permission Granted  Share Information with NAME: Caren Griffins     Permission granted to share info w Relationship: spouse  Permission granted to share info w Contact Information: [phone]  Emotional Assessment Appearance:: Other (Comment Required (unable to assess) Attitude/Demeanor/Rapport: Unable to Assess Affect (typically observed): Unable to Assess Orientation: : Oriented to Self, Oriented to Place, Oriented to  Time, Oriented to Situation Alcohol / Substance Use: Alcohol Use Psych Involvement: No (comment)  Admission diagnosis:  Hyponatremia [E87.1] Pain [R52] ICH (intracerebral hemorrhage) ([place]) [I61.9] Elevated troponin [R77.8] Fall, initial encounter [W19.XXXA] Alcoholic intoxication without complication ([place]) [E08.144] Compression fracture of L1 vertebra, initial encounter ([place]) [S32.010A] Compression fracture of L4 vertebra, initial encounter [facility]) [S32.040A] Patient Active Problem List   Diagnosis Date Noted   Compression fracture of thoracic vertebra ([place]) 08/18/2021  Compression fracture of L4 lumbar vertebra, closed, initial encounter ([place]) 08/18/2021   Hyponatremia 08/16/2021   Hypokalemia  08/16/2021   Rhabdomyolysis 08/16/2021   ICH (intracerebral hemorrhage) ([place]) 08/15/2021   S/P inguinal hernia repair 04/13/2019   Incarcerated left inguinal hernia    Abdominal pain 04/08/2019   Hernia 08/13/2013   Tobacco use disorder 08/13/2013   Chronic sinusitis 02/26/2013   Dyspnea 02/26/2013   Anxiety 02/03/2013   Left flank pain 02/03/2013   PCP:  [facility], [place]:   [facility] [address] [place] [address] Phone: [phone] Fax: [phone]  [id number] [place], [place] - [address] [place] [address] Phone: [phone] Fax: [place], [place] [address] Phone: [phone] Fax: [phone]     Social Determinants of Health (SDOH) Interventions    Readmission Risk Interventions No flowsheet data found.

## 2021-08-19 NOTE — Progress Notes (Signed)
PROGRESS NOTE    Lance House  SAY:[id number] DOB: Jul 20, 1951 DOA: 08/15/2021 PCP: [facility]   Chief complaint.  Severe low back pain. Brief Narrative:   Lance House is a 71 y.o. Caucasian male with medical history significant for anxiety, depression and gout who presented to the emergency room with acute onset of generalized weakness and a fall in the kitchen in front of the refrigerator.  Patient had a sodium of 112, CK of 5975, CT showed 9 mm intracranial hemorrhage. Patient was seen by physical therapy, had a severe back pain.  CT scan showed T12-L4 for compression fracture.  Assessment & Plan:   Principal Problem:   ICH (intracerebral hemorrhage) (HCC) Active Problems:   Hyponatremia   Hypokalemia   Rhabdomyolysis   Compression fracture of thoracic vertebra (HCC)   Compression fracture of L4 lumbar vertebra, closed, initial encounter (HCC)  Severe back pain and the left hip pain. Compression fracture between T12-L4. Patient has been evaluated by neurosurgery, MRI confirmed fracture at T12-L1.  Acute to subacute.  Hip MRI ruled out fracture. Pending TLSO.   Intracranial hemorrhage. Patient evaluated by neurosurgery, stable, no indication for surgery.  Severe symptomatic hyponatremia secondary to beer potomania. Hypokalemia. Traumatic rhabdomyolysis secondary to fall. Elevated troponin secondary to rhabdomyolysis. Sodium level improving.  CK level still going up.  I will continue IV fluids.  I will also give a dose of IV Lasix.     Hypertension urgency. Continue current treatment.  Alcohol abuse. Alcoholic hepatitis. Continue CIWA protocol.     DVT prophylaxis: SCDs Code Status: full Family Communication:  Disposition Plan: Patient may need nursing home placement.  Status is: Inpatient   Remains inpatient appropriate because: Due to severity of disease.     I/O last 3 completed shifts: In: 1570.1 [P.O.:600; I.V.:970.1] Out: 3450  [Urine:3450] Total I/O In: 240 [P.O.:240] Out: 1850 [Urine:1850]     Consultants:  Neurosurgery  Procedures: None  Antimicrobials: pending  Subjective: Patient still complaining of back pain, worse with movement. No incontinence of urine or bowel. No fever chills pain No short of breath or cough.  Objective: Vitals:   08/19/21 0821 08/19/21 1114 08/19/21 1128 08/19/21 1158  BP: 130/83  135/85   Pulse: 72  92   Resp: 16 16 16 16   Temp: 98.4 F (36.9 C)  97.9 F (36.6 C)   TempSrc:      SpO2: 98%  95%   Weight:      Height:        Intake/Output Summary (Last 24 hours) at 08/19/2021 1330 Last data filed at 08/19/2021 1116 Gross per 24 hour  Intake 600 ml  Output 4500 ml  Net -3900 ml   Filed Weights   08/15/21 0537  Weight: 86.2 kg    Examination:  General exam: Appears calm and comfortable  Respiratory system: Clear to auscultation. Respiratory effort normal. Cardiovascular system: S1 & S2 heard, RRR. No JVD, murmurs, rubs, gallops or clicks. No pedal edema. Gastrointestinal system: Abdomen is nondistended, soft and nontender. No organomegaly or masses felt. Normal bowel sounds heard. Central nervous system: Alert and oriented. No focal neurological deficits. Extremities: Symmetric 5 x 5 power. Skin: No rashes, lesions or ulcers Psychiatry: Judgement and insight appear normal. Mood & affect appropriate.     Data Reviewed: I have personally reviewed following labs and imaging studies  CBC: Recent Labs  Lab 08/15/21 0543 08/16/21 0710 08/17/21 0253 08/18/21 0504 08/19/21 0515  WBC 14.8* 7.8 10.0 7.4 6.4  NEUTROABS 13.3*  --  7.5 5.3 4.5  HGB 15.3 13.9 13.1 13.7 14.3  HCT 41.3 39.0 36.5* 39.0 42.1  MCV 88.8 92.4 91.7 94.4 96.1  PLT 181 153 171 190 355   Basic Metabolic Panel: Recent Labs  Lab 08/15/21 0543 08/16/21 0710 08/16/21 0858 08/16/21 1438 08/17/21 0253 08/17/21 0639 08/17/21 1648 08/18/21 0504 08/19/21 0515  NA 112* 123* 124*    < > 124* 127* 127* 130* 132*  K 3.7 2.9*  --   --  3.1*  --   --  3.8 3.7  CL 79* 91*  --   --  94*  --   --  98 99  CO2 21* 21*  --   --  24  --   --  25 25  GLUCOSE 92 62*  --   --  96  --   --  91 91  BUN <5* <5*  --   --  <5*  --   --  <5* <5*  CREATININE 0.37* 0.38*  --   --  0.40*  --   --  0.44* 0.53*  CALCIUM 8.2* 7.8*  --   --  7.8*  --   --  8.3* 8.6*  MG  --   --  1.9  --  1.9  --   --  1.9 1.9  PHOS  --   --   --   --  2.6  --   --   --  3.5   < > = values in this interval not displayed.   GFR: Estimated Creatinine Clearance: 99.9 mL/min (A) (by C-G formula based on SCr of 0.53 mg/dL (L)). Liver Function Tests: Recent Labs  Lab 08/15/21 0543  AST 144*  ALT 48*  ALKPHOS 93  BILITOT 2.2*  PROT 6.7  ALBUMIN 3.3*   Recent Labs  Lab 08/15/21 0543  LIPASE 24   No results for input(s): AMMONIA in the last 168 hours. Coagulation Profile: No results for input(s): INR, PROTIME in the last 168 hours. Cardiac Enzymes: Recent Labs  Lab 08/15/21 1845 08/16/21 0710 08/17/21 0253 08/18/21 0504 08/19/21 0515  CKTOTAL 3,059* 1,355* 1,507* 1,750* 1,882*   BNP (last 3 results) No results for input(s): PROBNP in the last 8760 hours. HbA1C: No results for input(s): HGBA1C in the last 72 hours. CBG: No results for input(s): GLUCAP in the last 168 hours. Lipid Profile: No results for input(s): CHOL, HDL, LDLCALC, TRIG, CHOLHDL, LDLDIRECT in the last 72 hours. Thyroid Function Tests: No results for input(s): TSH, T4TOTAL, FREET4, T3FREE, THYROIDAB in the last 72 hours. Anemia Panel: No results for input(s): VITAMINB12, FOLATE, FERRITIN, TIBC, IRON, RETICCTPCT in the last 72 hours. Sepsis Labs: No results for input(s): PROCALCITON, LATICACIDVEN in the last 168 hours.  Recent Results (from the past 240 hour(s))  Resp Panel by RT-PCR (Flu A&B, Covid) Nasopharyngeal Swab     Status: None   Collection Time: 08/15/21  5:43 AM   Specimen: Nasopharyngeal Swab;  Nasopharyngeal(NP) swabs in vial transport medium  Result Value Ref Range Status   SARS Coronavirus 2 by RT PCR NEGATIVE NEGATIVE Final    Comment: (NOTE) SARS-CoV-2 target nucleic acids are NOT DETECTED.  The SARS-CoV-2 RNA is generally detectable in upper respiratory specimens during the acute phase of infection. The lowest concentration of SARS-CoV-2 viral copies this assay can detect is 138 copies/mL. A negative result does not preclude SARS-Cov-2 infection and should not be used as the sole basis for treatment or other patient management decisions. A negative  result may occur with  improper specimen collection/handling, submission of specimen other than nasopharyngeal swab, presence of viral mutation(s) within the areas targeted by this assay, and inadequate number of viral copies(<138 copies/mL). A negative result must be combined with clinical observations, patient history, and epidemiological information. The expected result is Negative.  Fact Sheet for Patients:  EntrepreneurPulse.com.au  Fact Sheet for Healthcare Providers:  IncredibleEmployment.be  This test is no t yet approved or cleared by the [place] FDA and  has been authorized for detection and/or diagnosis of SARS-CoV-2 by FDA under an Emergency Use Authorization (EUA). This EUA will remain  in effect (meaning this test can be used) for the duration of the COVID-19 declaration under Section 564(b)(1) of the Act, 21 U.S.C.section 360bbb-3(b)(1), unless the authorization is terminated  or revoked sooner.       Influenza A by PCR NEGATIVE NEGATIVE Final   Influenza B by PCR NEGATIVE NEGATIVE Final    Comment: (NOTE) The Xpert Xpress SARS-CoV-2/FLU/RSV plus assay is intended as an aid in the diagnosis of influenza from Nasopharyngeal swab specimens and should not be used as a sole basis for treatment. Nasal washings and aspirates are unacceptable for Xpert Xpress  SARS-CoV-2/FLU/RSV testing.  Fact Sheet for Patients: EntrepreneurPulse.com.au  Fact Sheet for Healthcare Providers: IncredibleEmployment.be  This test is not yet approved or cleared by the [place] FDA and has been authorized for detection and/or diagnosis of SARS-CoV-2 by FDA under an Emergency Use Authorization (EUA). This EUA will remain in effect (meaning this test can be used) for the duration of the COVID-19 declaration under Section 564(b)(1) of the Act, 21 U.S.C. section 360bbb-3(b)(1), unless the authorization is terminated or revoked.  Performed at [facility], [address] [place], [place] [address]          Radiology Studies: DG Tibia/Fibula Left  Result Date: 08/18/2021 CLINICAL DATA:  Left tibia/fibula pain EXAM: LEFT TIBIA AND FIBULA - 2 VIEW COMPARISON:  None. FINDINGS: Possible nondisplaced distal fibular fracture. No evidence of dislocation. Soft tissues are unremarkable. IMPRESSION: Possible nondisplaced distal fibular fracture correlate for point tenderness and recommend ankle radiographs for further evaluation. Electronically Signed   By: Yetta Glassman M.D.   On: 08/18/2021 13:00   CT LUMBAR SPINE WO CONTRAST  Result Date: 08/18/2021 CLINICAL DATA:  Multiple falls, back pain EXAM: CT LUMBAR SPINE WITHOUT CONTRAST TECHNIQUE: Multidetector CT imaging of the lumbar spine was performed without intravenous contrast administration. Multiplanar CT image reconstructions were also generated. RADIATION DOSE REDUCTION: This exam was performed according to the departmental dose-optimization program which includes automated exposure control, adjustment of the mA and/or kV according to patient size and/or use of iterative reconstruction technique. COMPARISON:  CT abdomen and pelvis 04/08/2019 FINDINGS: Segmentation: 5 lumbar type vertebrae. Alignment: Normal. Vertebrae: There are acute appearing superior endplate  compression fracture deformities of T12 and L1 with 20-25% height loss centrally. Approximally 2 mm bulging bony retropulsion near the superior endplate of L1. Severe burst type compression fracture deformity of L4 with fracture extension through the posterior cortex, with complete height loss centrally and up to 4 mm bulging bony retropulsion at the superior endplate. Paraspinal and other soft tissues: No acute paraspinal soft tissue abnormality identified. Partially visualized densities in the bilateral lower lobes left-greater-than-right. Disc levels: Limited evaluation with CT. L1-L2: Facet and ligamentum flavum hypertrophy. Mild diffuse disc bulge. No significant spinal canal stenosis or neural foraminal narrowing. L2-L3: Facet and ligamentum flavum hypertrophy. Mild to moderate diffuse disc bulge slightly  eccentric to the right. No significant spinal canal stenosis or neural foraminal narrowing. L3-L4: Facet and ligamentum flavum hypertrophy. Diffuse disc bulge and prominent posterior epidural fat. Mild-to-moderate spinal canal stenosis and bilateral neural foraminal narrowing. L4-L5: Facet and ligamentum flavum hypertrophy. Moderate diffuse disc bulge. Likely mild spinal canal stenosis and moderate bilateral neural foraminal narrowing. L5-S1: Facet and ligamentum flavum hypertrophy. Diffuse disc bulge. No significant spinal canal stenosis. Mild bilateral neural foraminal narrowing. IMPRESSION: 1. Acute/recent appearing compression fractures of T12, L1 and most severely L4 as described. 2. Degenerative changes of the lumbar spine. 3. Densities in the partially visualized bilateral lower lobes left greater than right, possible significant atelectasis of the left lower lobe. Electronically Signed   By: Ofilia Neas M.D.   On: 08/18/2021 09:20   MR THORACIC SPINE WO CONTRAST  Result Date: 08/19/2021 CLINICAL DATA:  Compression fracture EXAM: MRI THORACIC AND LUMBAR SPINE WITHOUT CONTRAST TECHNIQUE:  Multiplanar and multiecho pulse sequences of the thoracic and lumbar spine were obtained without intravenous contrast. COMPARISON:  None. FINDINGS: MRI THORACIC SPINE FINDINGS Alignment:  Normal Vertebrae: Mild compression deformity of T12 with mild edema underlying the superior endplate. The other vertebral bodies are normal. Cord:  Normal Paraspinal and other soft tissues: Small pleural effusions Disc levels: No spinal canal stenosis. MRI LUMBAR SPINE FINDINGS Segmentation:  Standard. Alignment:  Physiologic. Vertebrae: Mild compression deformity of L1 and severe compression deformity of L4. Mild superior L1 endplate edema. Moderate diffuse L4 edema. At L4, there is 4 mm of retropulsion. Conus medullaris and cauda equina: Conus extends to the L2 level. Conus and cauda equina appear normal. Paraspinal and other soft tissues: Negative Disc levels: L1-L2: Normal disc space and facet joints. No spinal canal stenosis. No neural foraminal stenosis. L2-L3: Normal disc space and facet joints. No spinal canal stenosis. No neural foraminal stenosis. L3-L4: Retropulsion causes severe spinal canal stenosis. No neural foraminal stenosis. L4-L5: Right asymmetric disc bulge with right facet hypertrophy. Right spinal canal stenosis. Mild bilateral neural foraminal stenosis. L5-S1: Normal disc space and facet joints. No spinal canal stenosis. No neural foraminal stenosis. Visualized sacrum: Normal. IMPRESSION: 1. Acute to subacute compression fractures of T12, L1 and L4 2. Retropulsion of the L4 fracture causes severe spinal canal stenosis. 3. Small pleural effusions. Electronically Signed   By: Ulyses Jarred M.D.   On: 08/19/2021 00:00   MR LUMBAR SPINE WO CONTRAST  Result Date: 08/19/2021 CLINICAL DATA:  Compression fracture EXAM: MRI THORACIC AND LUMBAR SPINE WITHOUT CONTRAST TECHNIQUE: Multiplanar and multiecho pulse sequences of the thoracic and lumbar spine were obtained without intravenous contrast. COMPARISON:  None.  FINDINGS: MRI THORACIC SPINE FINDINGS Alignment:  Normal Vertebrae: Mild compression deformity of T12 with mild edema underlying the superior endplate. The other vertebral bodies are normal. Cord:  Normal Paraspinal and other soft tissues: Small pleural effusions Disc levels: No spinal canal stenosis. MRI LUMBAR SPINE FINDINGS Segmentation:  Standard. Alignment:  Physiologic. Vertebrae: Mild compression deformity of L1 and severe compression deformity of L4. Mild superior L1 endplate edema. Moderate diffuse L4 edema. At L4, there is 4 mm of retropulsion. Conus medullaris and cauda equina: Conus extends to the L2 level. Conus and cauda equina appear normal. Paraspinal and other soft tissues: Negative Disc levels: L1-L2: Normal disc space and facet joints. No spinal canal stenosis. No neural foraminal stenosis. L2-L3: Normal disc space and facet joints. No spinal canal stenosis. No neural foraminal stenosis. L3-L4: Retropulsion causes severe spinal canal stenosis. No neural foraminal stenosis. L4-L5: Right  asymmetric disc bulge with right facet hypertrophy. Right spinal canal stenosis. Mild bilateral neural foraminal stenosis. L5-S1: Normal disc space and facet joints. No spinal canal stenosis. No neural foraminal stenosis. Visualized sacrum: Normal. IMPRESSION: 1. Acute to subacute compression fractures of T12, L1 and L4 2. Retropulsion of the L4 fracture causes severe spinal canal stenosis. 3. Small pleural effusions. Electronically Signed   By: Ulyses Jarred M.D.   On: 08/19/2021 00:00   CT HIP LEFT WO CONTRAST  Result Date: 08/18/2021 CLINICAL DATA:  Left hip pain EXAM: CT OF THE LEFT HIP WITHOUT CONTRAST TECHNIQUE: Multidetector CT imaging of the left hip was performed according to the standard protocol. Multiplanar CT image reconstructions were also generated. RADIATION DOSE REDUCTION: This exam was performed according to the departmental dose-optimization program which includes automated exposure control,  adjustment of the mA and/or kV according to patient size and/or use of iterative reconstruction technique. COMPARISON:  X-ray 08/15/2021 FINDINGS: Bones/Joint/Cartilage Slight cortical discontinuity along the superior surface of the left femoral neck seen on coronal sequence (series 4, image 46). No discernible intramedullary fracture line. Otherwise, no evidence of fracture. No dislocation. Minimal left hip joint space narrowing. Mild enthesopathic changes at the greater trochanter and ischial tuberosity. No evidence of femoral head avascular necrosis. Visualized portion of the left hemipelvis appears intact and unremarkable. No suspicious lytic or sclerotic bone lesion. Ligaments Suboptimally assessed by CT. Muscles and Tendons No acute musculotendinous abnormality by CT. Soft tissues Subcutaneous edema with a small amount of ill-defined fluid within the soft tissues overlying the lateral aspect of the hip. No organized fluid collection. Circumferential urinary bladder wall thickening. Trace presacral fluid, nonspecific. No left inguinal lymphadenopathy. Atherosclerotic vascular calcifications are present. IMPRESSION: 1. Slight cortical discontinuity along the superior surface of the left femoral neck, which may be artifactual or represent a nondisplaced fracture. Further evaluation with MRI of the left hip is recommended. 2. Subcutaneous edema with a small amount of ill-defined fluid within the soft tissues overlying the lateral aspect of the hip, which may represent sequela of recent trauma. No organized fluid collection. 3. Circumferential urinary bladder wall thickening, which may represent cystitis. Correlate with urinalysis. Electronically Signed   By: Davina Poke D.O.   On: 08/18/2021 09:15   MR HIP LEFT WO CONTRAST  Result Date: 08/19/2021 CLINICAL DATA:  Hip pain, stress fracture suspected, neg xray EXAM: MR OF THE LEFT HIP WITHOUT CONTRAST TECHNIQUE: Multiplanar, multisequence MR imaging was  performed. No intravenous contrast was administered. COMPARISON:  X-ray 08/15/2021, CT 08/18/2021 FINDINGS: Technical Note: Despite efforts by the technologist and patient, motion artifact is present on today's exam and could not be eliminated. This reduces exam sensitivity and specificity. Bones: Acute compression fracture of the L4 vertebral body, as described on dedicated lumbar spine MRI. Elsewhere, no acute fracture. No dislocation. No femoral head avascular necrosis. No abnormality along the superior margin of the left femoral neck at site of abnormality described on recent CT. Pelvic bony ring intact without diastasis. No suspicious marrow replacing bone lesion. Articular cartilage and labrum Articular cartilage: No focal cartilage defect identified on motion degraded images. Labrum:  Suboptimally evaluated. Joint or bursal effusion Joint effusion:  Trace bilateral hip joint effusions, nonspecific. Bursae: No abnormal bursal fluid collection. Muscles and tendons Muscles and tendons: Bilateral gluteus medius and minimus tendinosis. Mild generalized intramuscular edema throughout the pelvis, nonspecific. Other findings Miscellaneous: No fluid collection. No inguinal lymphadenopathy. Circumferential thickening of the urinary bladder. Mild presacral fluid/edema. IMPRESSION: 1. Acute compression fracture  of the L4 vertebral body, as described on dedicated lumbar spine MRI. 2. No acute fracture or dislocation of the left hip. No abnormality along the superior margin of the left femoral neck at site of abnormality described on recent CT. 3. Bilateral gluteus medius and minimus tendinosis. 4. Mild generalized intramuscular edema throughout the pelvis, nonspecific. 5. Circumferential thickening of the urinary bladder, which may represent cystitis. Electronically Signed   By: Davina Poke D.O.   On: 08/19/2021 11:21   ECHOCARDIOGRAM COMPLETE  Result Date: 08/18/2021    ECHOCARDIOGRAM REPORT   Patient Name:    NORI POLAND Date of Exam: 08/18/2021 Medical Rec #:  [id number]     Height:       73.0 in Accession #:    [id number]    Weight:       190.0 lb Date of Birth:  23-Oct-1950     BSA:          2.105 m Patient Age:    17 years      BP:           134/89 mmHg Patient Gender: M             HR:           86 bpm. Exam Location:  [facility] Procedure: 2D Echo, Color Doppler, Cardiac Doppler and Intracardiac            Opacification Agent Indications:     Elevated troponin  History:         Patient has no prior history of Echocardiogram examinations.                  Risk Factors:Hypertension and Current Smoker. Gout.  Sonographer:     Charmayne Sheer Referring Phys:  [id number] Sharen Hones Diagnosing Phys: Ida Rogue MD  Sonographer Comments: Technically difficult study due to poor echo windows. IMPRESSIONS  1. Left ventricular ejection fraction, by estimation, is 60 to 65%. The left ventricle has normal function. The left ventricle has no regional wall motion abnormalities. Left ventricular diastolic parameters are consistent with Grade I diastolic dysfunction (impaired relaxation).  2. Right ventricular systolic function is normal. The right ventricular size is normal. Tricuspid regurgitation signal is inadequate for assessing PA pressure.  3. The mitral valve is normal in structure. No evidence of mitral valve regurgitation. No evidence of mitral stenosis.  4. The aortic valve was not well visualized. Aortic valve regurgitation is not visualized. Aortic valve sclerosis is present, with no evidence of aortic valve stenosis. FINDINGS  Left Ventricle: Left ventricular ejection fraction, by estimation, is 60 to 65%. The left ventricle has normal function. The left ventricle has no regional wall motion abnormalities. Definity contrast agent was given IV to delineate the left ventricular  endocardial borders. The left ventricular internal cavity size was normal in size. There is no left ventricular hypertrophy. Left ventricular diastolic  parameters are consistent with Grade I diastolic dysfunction (impaired relaxation). Right Ventricle: The right ventricular size is normal. No increase in right ventricular wall thickness. Right ventricular systolic function is normal. Tricuspid regurgitation signal is inadequate for assessing PA pressure. Left Atrium: Left atrial size was normal in size. Right Atrium: Right atrial size was normal in size. Pericardium: There is no evidence of pericardial effusion. Mitral Valve: The mitral valve is normal in structure. No evidence of mitral valve regurgitation. No evidence of mitral valve stenosis. MV peak gradient, 4.0 mmHg. The mean mitral valve gradient is 2.0 mmHg. Tricuspid Valve: The tricuspid  valve is normal in structure. Tricuspid valve regurgitation is not demonstrated. No evidence of tricuspid stenosis. Aortic Valve: The aortic valve was not well visualized. Aortic valve regurgitation is not visualized. Aortic valve sclerosis is present, with no evidence of aortic valve stenosis. Aortic valve mean gradient measures 3.0 mmHg. Aortic valve peak gradient measures 4.6 mmHg. Aortic valve area, by VTI measures 3.21 cm. Pulmonic Valve: The pulmonic valve was normal in structure. Pulmonic valve regurgitation is not visualized. No evidence of pulmonic stenosis. Aorta: The aortic root is normal in size and structure. Venous: The pulmonary veins were not well visualized. The inferior vena cava was not well visualized. IAS/Shunts: No atrial level shunt detected by color flow Doppler.  LEFT VENTRICLE PLAX 2D LVIDd:         5.45 cm   Diastology LVIDs:         4.61 cm   LV e' medial:    9.03 cm/s LV PW:         0.91 cm   LV E/e' medial:  8.3 LV IVS:        0.72 cm   LV e' lateral:   9.25 cm/s LVOT diam:     2.30 cm   LV E/e' lateral: 8.2 LV SV:         55 LV SV Index:   26 LVOT Area:     4.15 cm  LEFT ATRIUM           Index LA diam:      2.90 cm 1.38 cm/m LA Vol (A4C): 41.1 ml 19.52 ml/m  AORTIC VALVE                     PULMONIC VALVE AV Area (Vmax):    2.97 cm     PV Vmax:       0.89 m/s AV Area (Vmean):   3.00 cm     PV Vmean:      62.000 cm/s AV Area (VTI):     3.21 cm     PV VTI:        0.138 m AV Vmax:           107.00 cm/s  PV Peak grad:  3.2 mmHg AV Vmean:          75.300 cm/s  PV Mean grad:  2.0 mmHg AV VTI:            0.172 m AV Peak Grad:      4.6 mmHg AV Mean Grad:      3.0 mmHg LVOT Vmax:         76.40 cm/s LVOT Vmean:        54.300 cm/s LVOT VTI:          0.133 m LVOT/AV VTI ratio: 0.77  AORTA Ao Root diam: 3.40 cm MITRAL VALVE MV Area (PHT): 8.52 cm    SHUNTS MV Area VTI:   3.63 cm    Systemic VTI:  0.13 m MV Peak grad:  4.0 mmHg    Systemic Diam: 2.30 cm MV Mean grad:  2.0 mmHg MV Vmax:       1.00 m/s MV Vmean:      66.1 cm/s MV Decel Time: 89 msec MV E velocity: 75.40 cm/s MV A velocity: 96.80 cm/s MV E/A ratio:  0.78 Ida Rogue MD Electronically signed by Ida Rogue MD Signature Date/Time: 08/18/2021/5:53:23 PM    Final         Scheduled Meds:  allopurinol  300  mg Oral Daily   folic acid  1 mg Oral Daily   multivitamin with minerals  1 tablet Oral Daily   omega-3 acid ethyl esters   Oral Daily   senna-docusate  2 tablet Oral BID   thiamine  100 mg Oral Daily   Or   thiamine  100 mg Intravenous Daily   Vitamin D (Ergocalciferol)  50,000 Units Oral Weekly   Continuous Infusions:  0.9 % NaCl with KCl 20 mEq / L 100 mL/hr at 08/18/21 1826     LOS: 4 days    Time spent: 28 minutes    Sharen Hones, MD [facility]   To contact the attending provider between 7A-7P or the covering provider during after hours 7P-7A, please log into the web site [email or web address] and access using universal [place] password for that web site. If you do not have the password, please call the hospital operator.  08/19/2021, 1:30 PM

## 2021-08-19 NOTE — NC FL2 (Signed)
[place] LEVEL OF CARE SCREENING TOOL     IDENTIFICATION  Patient Name: Lance House Birthdate: 1951-07-05 Sex: male Admission Date (Current Location): 08/15/2021  [facility] and [place] Number:  Engineering geologist and Address:  [facility], [address], [place], [place] [address]      Provider Number: [id number]  Attending Physician Name and Address:  Sharen Hones, MD  Relative Name and Phone Number:  Caren Griffins [phone]    Current Level of Care: Hospital Recommended Level of Care: [place] Prior Approval Number:    Date Approved/Denied:   PASRR Number:    Discharge Plan: SNF    Current Diagnoses: Patient Active Problem List   Diagnosis Date Noted   Compression fracture of thoracic vertebra ([place] 08/18/2021   Compression fracture of L4 lumbar vertebra, closed, initial encounter ([place]) 08/18/2021   Hyponatremia 08/16/2021   Hypokalemia 08/16/2021   Rhabdomyolysis 08/16/2021   ICH (intracerebral hemorrhage) ([place]) 08/15/2021   S/P inguinal hernia repair 04/13/2019   Incarcerated left inguinal hernia    Abdominal pain 04/08/2019   Hernia 08/13/2013   Tobacco use disorder 08/13/2013   Chronic sinusitis 02/26/2013   Dyspnea 02/26/2013   Anxiety 02/03/2013   Left flank pain 02/03/2013    Orientation RESPIRATION BLADDER Height & Weight     Self, Time, Situation, Place  Normal Incontinent Weight: 190 lb (86.2 kg) Height:  6\' 1"  (185.4 cm)  BEHAVIORAL SYMPTOMS/MOOD NEUROLOGICAL BOWEL NUTRITION STATUS      Continent    AMBULATORY STATUS COMMUNICATION OF NEEDS Skin   Extensive Assist Verbally Normal                       Personal Care Assistance Level of Assistance  Bathing, Feeding, Dressing Bathing Assistance: Maximum assistance Feeding assistance: Limited assistance Dressing Assistance: Maximum assistance     Functional Limitations Info  Sight, Hearing, Speech Sight Info: Impaired Hearing  Info: Adequate Speech Info: Adequate    SPECIAL CARE FACTORS FREQUENCY  PT (By licensed PT), OT (By licensed OT)     PT Frequency: 5x week OT Frequency: 5x week            Contractures Contractures Info: Not present    Additional Factors Info  Allergies   Allergies Info: Ciprocin, ciprofloxacin, colchicine, duloxetine           Current Medications (08/19/2021):  This is the current hospital active medication list Current Facility-Administered Medications  Medication Dose Route Frequency Provider Last Rate Last Admin   0.9 % NaCl with KCl 20 mEq/ L  infusion   Intravenous Continuous Sharen Hones, MD 100 mL/hr at 08/18/21 1826 New Bag at 08/18/21 1826   acetaminophen (TYLENOL) tablet 650 mg  650 mg Oral Q6H PRN Mansy, Jan A, MD   650 mg at 08/19/21 0445   Or   acetaminophen (TYLENOL) suppository 650 mg  650 mg Rectal Q6H PRN Mansy, Jan A, MD       allopurinol (ZYLOPRIM) tablet 300 mg  300 mg Oral Daily Mansy, Jan A, MD   300 mg at 08/18/21 1334   clonazePAM (KLONOPIN) tablet 1 mg  1 mg Oral BID PRN Carrie Mew, MD   1 mg at 50/93/26 7124   folic acid (FOLVITE) tablet 1 mg  1 mg Oral Daily Sharen Hones, MD   1 mg at 08/19/21 5809   LORazepam (ATIVAN) tablet 1-4 mg  1-4 mg Oral Q1H PRN Sharen Hones, MD  Or   LORazepam (ATIVAN) injection 1-4 mg  1-4 mg Intravenous Q1H PRN Sharen Hones, MD   3 mg at 08/18/21 1029   magnesium hydroxide (MILK OF MAGNESIA) suspension 30 mL  30 mL Oral Daily PRN Mansy, Jan A, MD       multivitamin with minerals tablet 1 tablet  1 tablet Oral Daily Sharen Hones, MD   1 tablet at 08/19/21 1771   omega-3 acid ethyl esters (LOVAZA) capsule   Oral Daily Mansy, Jan A, MD   1 g at 08/19/21 1657   ondansetron (ZOFRAN) tablet 4 mg  4 mg Oral Q6H PRN Mansy, Jan A, MD       Or   ondansetron [facility]) injection 4 mg  4 mg Intravenous Q6H PRN Mansy, Jan A, MD   4 mg at 08/18/21 0445   oxyCODONE (Oxy IR/ROXICODONE) immediate release tablet 10 mg  10 mg  Oral TID PRN Mansy, Jan A, MD   10 mg at 08/19/21 1113   senna-docusate (Senokot-S) tablet 2 tablet  2 tablet Oral BID Sharen Hones, MD   2 tablet at 08/18/21 1951   thiamine tablet 100 mg  100 mg Oral Daily Sharen Hones, MD       Or   thiamine (B-1) injection 100 mg  100 mg Intravenous Daily Sharen Hones, MD   100 mg at 08/19/21 9038   traZODone (DESYREL) tablet 25 mg  25 mg Oral QHS PRN Mansy, Jan A, MD   25 mg at 08/17/21 2355   Vitamin D (Ergocalciferol) (DRISDOL) capsule 50,000 Units  50,000 Units Oral Weekly Mansy, Arvella Merles, MD   50,000 Units at 08/19/21 3338     Discharge Medications: Please see discharge summary for a list of discharge medications.  Relevant Imaging Results:  Relevant Lab Results:   Additional Information SS: [id number]  Boris Sharper, LCSW

## 2021-08-20 LAB — BASIC METABOLIC PANEL
Anion gap: 7 (ref 5–15)
BUN: <5 mg/dL — ABNORMAL LOW (ref 8–23)
CO2: 24 mmol/L (ref 22–32)
Calcium: 8.4 mg/dL — ABNORMAL LOW (ref 8.9–10.3)
Chloride: 102 mmol/L (ref 98–111)
Creatinine, Ser: 0.44 mg/dL — ABNORMAL LOW (ref 0.61–1.24)
GFR, Estimated: >60 mL/min (ref >60)
Glucose, Bld: 94 mg/dL (ref 70–99)
Potassium: 3.8 mmol/L (ref 3.5–5.1)
Sodium: 133 mmol/L — ABNORMAL LOW (ref 135–145)

## 2021-08-20 LAB — TROPONIN I (HIGH SENSITIVITY): Troponin I (High Sensitivity): 18 ng/L — ABNORMAL HIGH (ref <18)

## 2021-08-20 LAB — CK: Total CK: 890 U/L — ABNORMAL HIGH (ref 49–397)

## 2021-08-20 LAB — MAGNESIUM: Magnesium: 1.8 mg/dL (ref 1.7–2.4)

## 2021-08-20 NOTE — Clinical Social Work Note (Signed)
RE:  Lance House   Date of Birth:   _25-Dec-1952___  Date:  08/20/21      To Whom It May Concern:  Please be advised that the above-named patient will require a short-term nursing home stay - anticipated 30 days or less for rehabilitation and strengthening.  The plan is for return home.   Md electronic signature and date noted below.

## 2021-08-20 NOTE — Progress Notes (Signed)
PROGRESS NOTE    Lance House  PZW:[id number] DOB: 09-03-1950 DOA: 08/15/2021 PCP: [facility]    Brief Narrative:  Lance House is a 71 y.o. Caucasian male with medical history significant for anxiety, depression and gout who presented to the emergency room with acute onset of generalized weakness and a fall in the kitchen in front of the refrigerator.  Patient had a sodium of 112, CK of 5975, CT showed 9 mm intracranial hemorrhage. Patient was seen by physical therapy, had a severe back pain.  CT scan showed T12-L4 for compression fracture.   Assessment & Plan:   Principal Problem:   ICH (intracerebral hemorrhage) (HCC) Active Problems:   Hyponatremia   Hypokalemia   Rhabdomyolysis   Compression fracture of thoracic vertebra (HCC)   Compression fracture of L4 lumbar vertebra, closed, initial encounter (HCC)  Severe back pain and the left hip pain. Compression fracture between T12-L4. Patient has been evaluated by neurosurgery, MRI confirmed fracture at T12-L1.  Acute to subacute.  Hip MRI ruled out fracture. Started TLSO. Continue pain medicine.  Pain seems to be better controlled today.  Intracranial hemorrhage. Patient evaluated by neurosurgery, stable, no indication for surgery.  Severe symptomatic hyponatremia secondary to beer potomania. Hypokalemia. Traumatic rhabdomyolysis secondary to fall. Elevated troponin secondary to rhabdomyolysis. Patient was giving IV fluids, also received Lasix yesterday.  Condition finally improved.  CK level had dropped down below 1000.  Sodium level also improved.  Will discontinue IV fluids.     Hypertension urgency. Continue current treatment.  Alcohol abuse. Alcoholic hepatitis. Continue CIWA protocol.     DVT prophylaxis: SCDs Code Status: full Family Communication:  Disposition Plan: Patient may need nursing home placement.  Status is: Inpatient   Remains inpatient appropriate because: Due to severity  of disease.       I/O last 3 completed shifts: In: 600 [P.O.:600] Out: 5950 [Urine:5950] No intake/output data recorded.     Consultants:  Neurosurgery  Procedures: None  Antimicrobials: None  Subjective: Patient condition much improved today.  Still has significant weakness and back pain. Denies any short of breath or cough. No abdominal pain or nausea vomiting.  Objective: Vitals:   08/19/21 2316 08/20/21 0407 08/20/21 0418 08/20/21 0809  BP: 130/83 (!) 148/90 (!) 143/95 (!) 149/85  Pulse: 75 80 91 77  Resp: 18 18 18 16   Temp: (!) 97.4 F (36.3 C) 98.2 F (36.8 C) 97.8 F (36.6 C) 97.6 F (36.4 C)  TempSrc:   Oral Oral  SpO2: 95% 95% 94% 96%  Weight:      Height:        Intake/Output Summary (Last 24 hours) at 08/20/2021 1118 Last data filed at 08/20/2021 0500 Gross per 24 hour  Intake 240 ml  Output 1450 ml  Net -1210 ml   Filed Weights   08/15/21 0537  Weight: 86.2 kg    Examination:  General exam: Appears calm and comfortable  Respiratory system: Clear to auscultation. Respiratory effort normal. Cardiovascular system: S1 & S2 heard, RRR. No JVD, murmurs, rubs, gallops or clicks. No pedal edema. Gastrointestinal system: Abdomen is nondistended, soft and nontender. No organomegaly or masses felt. Normal bowel sounds heard. Central nervous system: Alert and oriented. No focal neurological deficits. Extremities: Symmetric 5 x 5 power. Skin: No rashes, lesions or ulcers Psychiatry: Judgement and insight appear normal. Mood & affect appropriate.     Data Reviewed: I have personally reviewed following labs and imaging studies  CBC: Recent Labs  Lab 08/15/21 0543  08/16/21 0710 08/17/21 0253 08/18/21 0504 08/19/21 0515  WBC 14.8* 7.8 10.0 7.4 6.4  NEUTROABS 13.3*  --  7.5 5.3 4.5  HGB 15.3 13.9 13.1 13.7 14.3  HCT 41.3 39.0 36.5* 39.0 42.1  MCV 88.8 92.4 91.7 94.4 96.1  PLT 181 153 171 190 625   Basic Metabolic Panel: Recent Labs  Lab  08/16/21 0710 08/16/21 0858 08/16/21 1438 08/17/21 0253 08/17/21 0639 08/17/21 1648 08/18/21 0504 08/19/21 0515 08/20/21 0537  NA 123* 124*   < > 124* 127* 127* 130* 132* 133*  K 2.9*  --   --  3.1*  --   --  3.8 3.7 3.8  CL 91*  --   --  94*  --   --  98 99 102  CO2 21*  --   --  24  --   --  25 25 24   GLUCOSE 62*  --   --  96  --   --  91 91 94  BUN <5*  --   --  <5*  --   --  <5* <5* <5*  CREATININE 0.38*  --   --  0.40*  --   --  0.44* 0.53* 0.44*  CALCIUM 7.8*  --   --  7.8*  --   --  8.3* 8.6* 8.4*  MG  --  1.9  --  1.9  --   --  1.9 1.9 1.8  PHOS  --   --   --  2.6  --   --   --  3.5  --    < > = values in this interval not displayed.   GFR: Estimated Creatinine Clearance: 99.9 mL/min (A) (by C-G formula based on SCr of 0.44 mg/dL (L)). Liver Function Tests: Recent Labs  Lab 08/15/21 0543  AST 144*  ALT 48*  ALKPHOS 93  BILITOT 2.2*  PROT 6.7  ALBUMIN 3.3*   Recent Labs  Lab 08/15/21 0543  LIPASE 24   No results for input(s): AMMONIA in the last 168 hours. Coagulation Profile: No results for input(s): INR, PROTIME in the last 168 hours. Cardiac Enzymes: Recent Labs  Lab 08/16/21 0710 08/17/21 0253 08/18/21 0504 08/19/21 0515 08/20/21 0537  CKTOTAL 1,355* 1,507* 1,750* 1,882* 890*   BNP (last 3 results) No results for input(s): PROBNP in the last 8760 hours. HbA1C: No results for input(s): HGBA1C in the last 72 hours. CBG: No results for input(s): GLUCAP in the last 168 hours. Lipid Profile: No results for input(s): CHOL, HDL, LDLCALC, TRIG, CHOLHDL, LDLDIRECT in the last 72 hours. Thyroid Function Tests: No results for input(s): TSH, T4TOTAL, FREET4, T3FREE, THYROIDAB in the last 72 hours. Anemia Panel: No results for input(s): VITAMINB12, FOLATE, FERRITIN, TIBC, IRON, RETICCTPCT in the last 72 hours. Sepsis Labs: No results for input(s): PROCALCITON, LATICACIDVEN in the last 168 hours.  Recent Results (from the past 240 hour(s))  Resp Panel  by RT-PCR (Flu A&B, Covid) Nasopharyngeal Swab     Status: None   Collection Time: 08/15/21  5:43 AM   Specimen: Nasopharyngeal Swab; Nasopharyngeal(NP) swabs in vial transport medium  Result Value Ref Range Status   SARS Coronavirus 2 by RT PCR NEGATIVE NEGATIVE Final    Comment: (NOTE) SARS-CoV-2 target nucleic acids are NOT DETECTED.  The SARS-CoV-2 RNA is generally detectable in upper respiratory specimens during the acute phase of infection. The lowest concentration of SARS-CoV-2 viral copies this assay can detect is 138 copies/mL. A negative result does not preclude SARS-Cov-2  infection and should not be used as the sole basis for treatment or other patient management decisions. A negative result may occur with  improper specimen collection/handling, submission of specimen other than nasopharyngeal swab, presence of viral mutation(s) within the areas targeted by this assay, and inadequate number of viral copies(<138 copies/mL). A negative result must be combined with clinical observations, patient history, and epidemiological information. The expected result is Negative.  Fact Sheet for Patients:  EntrepreneurPulse.com.au  Fact Sheet for Healthcare Providers:  IncredibleEmployment.be  This test is no t yet approved or cleared by the [place] FDA and  has been authorized for detection and/or diagnosis of SARS-CoV-2 by FDA under an Emergency Use Authorization (EUA). This EUA will remain  in effect (meaning this test can be used) for the duration of the COVID-19 declaration under Section 564(b)(1) of the Act, 21 U.S.C.section 360bbb-3(b)(1), unless the authorization is terminated  or revoked sooner.       Influenza A by PCR NEGATIVE NEGATIVE Final   Influenza B by PCR NEGATIVE NEGATIVE Final    Comment: (NOTE) The Xpert Xpress SARS-CoV-2/FLU/RSV plus assay is intended as an aid in the diagnosis of influenza from Nasopharyngeal swab  specimens and should not be used as a sole basis for treatment. Nasal washings and aspirates are unacceptable for Xpert Xpress SARS-CoV-2/FLU/RSV testing.  Fact Sheet for Patients: EntrepreneurPulse.com.au  Fact Sheet for Healthcare Providers: IncredibleEmployment.be  This test is not yet approved or cleared by the [place] FDA and has been authorized for detection and/or diagnosis of SARS-CoV-2 by FDA under an Emergency Use Authorization (EUA). This EUA will remain in effect (meaning this test can be used) for the duration of the COVID-19 declaration under Section 564(b)(1) of the Act, 21 U.S.C. section 360bbb-3(b)(1), unless the authorization is terminated or revoked.  Performed at [facility], [address], [place],  [address]          Radiology Studies: DG Tibia/Fibula Left  Result Date: 08/18/2021 CLINICAL DATA:  Left tibia/fibula pain EXAM: LEFT TIBIA AND FIBULA - 2 VIEW COMPARISON:  None. FINDINGS: Possible nondisplaced distal fibular fracture. No evidence of dislocation. Soft tissues are unremarkable. IMPRESSION: Possible nondisplaced distal fibular fracture correlate for point tenderness and recommend ankle radiographs for further evaluation. Electronically Signed   By: Yetta Glassman M.D.   On: 08/18/2021 13:00   MR THORACIC SPINE WO CONTRAST  Result Date: 08/19/2021 CLINICAL DATA:  Compression fracture EXAM: MRI THORACIC AND LUMBAR SPINE WITHOUT CONTRAST TECHNIQUE: Multiplanar and multiecho pulse sequences of the thoracic and lumbar spine were obtained without intravenous contrast. COMPARISON:  None. FINDINGS: MRI THORACIC SPINE FINDINGS Alignment:  Normal Vertebrae: Mild compression deformity of T12 with mild edema underlying the superior endplate. The other vertebral bodies are normal. Cord:  Normal Paraspinal and other soft tissues: Small pleural effusions Disc levels: No spinal canal stenosis. MRI LUMBAR  SPINE FINDINGS Segmentation:  Standard. Alignment:  Physiologic. Vertebrae: Mild compression deformity of L1 and severe compression deformity of L4. Mild superior L1 endplate edema. Moderate diffuse L4 edema. At L4, there is 4 mm of retropulsion. Conus medullaris and cauda equina: Conus extends to the L2 level. Conus and cauda equina appear normal. Paraspinal and other soft tissues: Negative Disc levels: L1-L2: Normal disc space and facet joints. No spinal canal stenosis. No neural foraminal stenosis. L2-L3: Normal disc space and facet joints. No spinal canal stenosis. No neural foraminal stenosis. L3-L4: Retropulsion causes severe spinal canal stenosis. No neural foraminal stenosis. L4-L5: Right asymmetric disc  bulge with right facet hypertrophy. Right spinal canal stenosis. Mild bilateral neural foraminal stenosis. L5-S1: Normal disc space and facet joints. No spinal canal stenosis. No neural foraminal stenosis. Visualized sacrum: Normal. IMPRESSION: 1. Acute to subacute compression fractures of T12, L1 and L4 2. Retropulsion of the L4 fracture causes severe spinal canal stenosis. 3. Small pleural effusions. Electronically Signed   By: Ulyses Jarred M.D.   On: 08/19/2021 00:00   MR LUMBAR SPINE WO CONTRAST  Result Date: 08/19/2021 CLINICAL DATA:  Compression fracture EXAM: MRI THORACIC AND LUMBAR SPINE WITHOUT CONTRAST TECHNIQUE: Multiplanar and multiecho pulse sequences of the thoracic and lumbar spine were obtained without intravenous contrast. COMPARISON:  None. FINDINGS: MRI THORACIC SPINE FINDINGS Alignment:  Normal Vertebrae: Mild compression deformity of T12 with mild edema underlying the superior endplate. The other vertebral bodies are normal. Cord:  Normal Paraspinal and other soft tissues: Small pleural effusions Disc levels: No spinal canal stenosis. MRI LUMBAR SPINE FINDINGS Segmentation:  Standard. Alignment:  Physiologic. Vertebrae: Mild compression deformity of L1 and severe compression  deformity of L4. Mild superior L1 endplate edema. Moderate diffuse L4 edema. At L4, there is 4 mm of retropulsion. Conus medullaris and cauda equina: Conus extends to the L2 level. Conus and cauda equina appear normal. Paraspinal and other soft tissues: Negative Disc levels: L1-L2: Normal disc space and facet joints. No spinal canal stenosis. No neural foraminal stenosis. L2-L3: Normal disc space and facet joints. No spinal canal stenosis. No neural foraminal stenosis. L3-L4: Retropulsion causes severe spinal canal stenosis. No neural foraminal stenosis. L4-L5: Right asymmetric disc bulge with right facet hypertrophy. Right spinal canal stenosis. Mild bilateral neural foraminal stenosis. L5-S1: Normal disc space and facet joints. No spinal canal stenosis. No neural foraminal stenosis. Visualized sacrum: Normal. IMPRESSION: 1. Acute to subacute compression fractures of T12, L1 and L4 2. Retropulsion of the L4 fracture causes severe spinal canal stenosis. 3. Small pleural effusions. Electronically Signed   By: Ulyses Jarred M.D.   On: 08/19/2021 00:00   MR HIP LEFT WO CONTRAST  Result Date: 08/19/2021 CLINICAL DATA:  Hip pain, stress fracture suspected, neg xray EXAM: MR OF THE LEFT HIP WITHOUT CONTRAST TECHNIQUE: Multiplanar, multisequence MR imaging was performed. No intravenous contrast was administered. COMPARISON:  X-ray 08/15/2021, CT 08/18/2021 FINDINGS: Technical Note: Despite efforts by the technologist and patient, motion artifact is present on today's exam and could not be eliminated. This reduces exam sensitivity and specificity. Bones: Acute compression fracture of the L4 vertebral body, as described on dedicated lumbar spine MRI. Elsewhere, no acute fracture. No dislocation. No femoral head avascular necrosis. No abnormality along the superior margin of the left femoral neck at site of abnormality described on recent CT. Pelvic bony ring intact without diastasis. No suspicious marrow replacing bone  lesion. Articular cartilage and labrum Articular cartilage: No focal cartilage defect identified on motion degraded images. Labrum:  Suboptimally evaluated. Joint or bursal effusion Joint effusion:  Trace bilateral hip joint effusions, nonspecific. Bursae: No abnormal bursal fluid collection. Muscles and tendons Muscles and tendons: Bilateral gluteus medius and minimus tendinosis. Mild generalized intramuscular edema throughout the pelvis, nonspecific. Other findings Miscellaneous: No fluid collection. No inguinal lymphadenopathy. Circumferential thickening of the urinary bladder. Mild presacral fluid/edema. IMPRESSION: 1. Acute compression fracture of the L4 vertebral body, as described on dedicated lumbar spine MRI. 2. No acute fracture or dislocation of the left hip. No abnormality along the superior margin of the left femoral neck at site of abnormality described on recent CT.  3. Bilateral gluteus medius and minimus tendinosis. 4. Mild generalized intramuscular edema throughout the pelvis, nonspecific. 5. Circumferential thickening of the urinary bladder, which may represent cystitis. Electronically Signed   By: Davina Poke D.O.   On: 08/19/2021 11:21   ECHOCARDIOGRAM COMPLETE  Result Date: 08/18/2021    ECHOCARDIOGRAM REPORT   Patient Name:   Lance House Date of Exam: 08/18/2021 Medical Rec #:  [id number]     Height:       73.0 in Accession #:    [id number]    Weight:       190.0 lb Date of Birth:  [birth year]     BSA:          2.105 m Patient Age:    61 years      BP:           134/89 mmHg Patient Gender: M             HR:           86 bpm. Exam Location:  [facility] Procedure: 2D Echo, Color Doppler, Cardiac Doppler and Intracardiac            Opacification Agent Indications:     Elevated troponin  History:         Patient has no prior history of Echocardiogram examinations.                  Risk Factors:Hypertension and Current Smoker. Gout.  Sonographer:     Charmayne Sheer Referring Phys:  [id number] Sharen Hones  Diagnosing Phys: Ida Rogue MD  Sonographer Comments: Technically difficult study due to poor echo windows. IMPRESSIONS  1. Left ventricular ejection fraction, by estimation, is 60 to 65%. The left ventricle has normal function. The left ventricle has no regional wall motion abnormalities. Left ventricular diastolic parameters are consistent with Grade I diastolic dysfunction (impaired relaxation).  2. Right ventricular systolic function is normal. The right ventricular size is normal. Tricuspid regurgitation signal is inadequate for assessing PA pressure.  3. The mitral valve is normal in structure. No evidence of mitral valve regurgitation. No evidence of mitral stenosis.  4. The aortic valve was not well visualized. Aortic valve regurgitation is not visualized. Aortic valve sclerosis is present, with no evidence of aortic valve stenosis. FINDINGS  Left Ventricle: Left ventricular ejection fraction, by estimation, is 60 to 65%. The left ventricle has normal function. The left ventricle has no regional wall motion abnormalities. Definity contrast agent was given IV to delineate the left ventricular  endocardial borders. The left ventricular internal cavity size was normal in size. There is no left ventricular hypertrophy. Left ventricular diastolic parameters are consistent with Grade I diastolic dysfunction (impaired relaxation). Right Ventricle: The right ventricular size is normal. No increase in right ventricular wall thickness. Right ventricular systolic function is normal. Tricuspid regurgitation signal is inadequate for assessing PA pressure. Left Atrium: Left atrial size was normal in size. Right Atrium: Right atrial size was normal in size. Pericardium: There is no evidence of pericardial effusion. Mitral Valve: The mitral valve is normal in structure. No evidence of mitral valve regurgitation. No evidence of mitral valve stenosis. MV peak gradient, 4.0 mmHg. The mean mitral valve gradient is 2.0 mmHg.  Tricuspid Valve: The tricuspid valve is normal in structure. Tricuspid valve regurgitation is not demonstrated. No evidence of tricuspid stenosis. Aortic Valve: The aortic valve was not well visualized. Aortic valve regurgitation is not visualized. Aortic valve sclerosis is present, with no evidence of aortic  valve stenosis. Aortic valve mean gradient measures 3.0 mmHg. Aortic valve peak gradient measures 4.6 mmHg. Aortic valve area, by VTI measures 3.21 cm. Pulmonic Valve: The pulmonic valve was normal in structure. Pulmonic valve regurgitation is not visualized. No evidence of pulmonic stenosis. Aorta: The aortic root is normal in size and structure. Venous: The pulmonary veins were not well visualized. The inferior vena cava was not well visualized. IAS/Shunts: No atrial level shunt detected by color flow Doppler.  LEFT VENTRICLE PLAX 2D LVIDd:         5.45 cm   Diastology LVIDs:         4.61 cm   LV e' medial:    9.03 cm/s LV PW:         0.91 cm   LV E/e' medial:  8.3 LV IVS:        0.72 cm   LV e' lateral:   9.25 cm/s LVOT diam:     2.30 cm   LV E/e' lateral: 8.2 LV SV:         55 LV SV Index:   26 LVOT Area:     4.15 cm  LEFT ATRIUM           Index LA diam:      2.90 cm 1.38 cm/m LA Vol (A4C): 41.1 ml 19.52 ml/m  AORTIC VALVE                    PULMONIC VALVE AV Area (Vmax):    2.97 cm     PV Vmax:       0.89 m/s AV Area (Vmean):   3.00 cm     PV Vmean:      62.000 cm/s AV Area (VTI):     3.21 cm     PV VTI:        0.138 m AV Vmax:           107.00 cm/s  PV Peak grad:  3.2 mmHg AV Vmean:          75.300 cm/s  PV Mean grad:  2.0 mmHg AV VTI:            0.172 m AV Peak Grad:      4.6 mmHg AV Mean Grad:      3.0 mmHg LVOT Vmax:         76.40 cm/s LVOT Vmean:        54.300 cm/s LVOT VTI:          0.133 m LVOT/AV VTI ratio: 0.77  AORTA Ao Root diam: 3.40 cm MITRAL VALVE MV Area (PHT): 8.52 cm    SHUNTS MV Area VTI:   3.63 cm    Systemic VTI:  0.13 m MV Peak grad:  4.0 mmHg    Systemic Diam: 2.30 cm MV Mean  grad:  2.0 mmHg MV Vmax:       1.00 m/s MV Vmean:      66.1 cm/s MV Decel Time: 89 msec MV E velocity: 75.40 cm/s MV A velocity: 96.80 cm/s MV E/A ratio:  0.78 Ida Rogue MD Electronically signed by Ida Rogue MD Signature Date/Time: 08/18/2021/5:53:23 PM    Final         Scheduled Meds:  allopurinol  300 mg Oral Daily   folic acid  1 mg Oral Daily   multivitamin with minerals  1 tablet Oral Daily   omega-3 acid ethyl esters   Oral Daily   senna-docusate  2 tablet Oral BID  thiamine  100 mg Oral Daily   Or   thiamine  100 mg Intravenous Daily   Vitamin D (Ergocalciferol)  50,000 Units Oral Weekly   Continuous Infusions:   LOS: 5 days    Time spent: 28 minutes    Sharen Hones, MD [facility]   To contact the attending provider between 7A-7P or the covering provider during after hours 7P-7A, please log into the web site [email or web address] and access using universal [place] password for that web site. If you do not have the password, please call the hospital operator.  08/20/2021, 11:18 AM

## 2021-08-21 LAB — CK: Total CK: 450 U/L — ABNORMAL HIGH (ref 49–397)

## 2021-08-21 LAB — BASIC METABOLIC PANEL
Anion gap: 5 (ref 5–15)
BUN: <5 mg/dL — ABNORMAL LOW (ref 8–23)
CO2: 26 mmol/L (ref 22–32)
Calcium: 8.6 mg/dL — ABNORMAL LOW (ref 8.9–10.3)
Chloride: 99 mmol/L (ref 98–111)
Creatinine, Ser: 0.4 mg/dL — ABNORMAL LOW (ref 0.61–1.24)
GFR, Estimated: >60 mL/min (ref >60)
Glucose, Bld: 114 mg/dL — ABNORMAL HIGH (ref 70–99)
Potassium: 3.6 mmol/L (ref 3.5–5.1)
Sodium: 130 mmol/L — ABNORMAL LOW (ref 135–145)

## 2021-08-21 MED ORDER — SODIUM CHLORIDE 1 G PO TABS
1.0000 g | ORAL_TABLET | Freq: Two times a day (BID) | ORAL | Status: DC
  Administered 2021-08-21 – 2021-08-24 (×7): 1 g via ORAL
  Filled 2021-08-21 (×8): qty 1

## 2021-08-21 NOTE — Progress Notes (Signed)
PROGRESS NOTE    Lance House  QQV:[id number] DOB: [birth year] DOA: 08/15/2021 PCP: [facility]    Brief Narrative:  Lance House is a 71 y.o. Caucasian male with medical history significant for anxiety, depression and gout who presented to the emergency room with acute onset of generalized weakness and a fall in the kitchen in front of the refrigerator.  Patient had a sodium of 112, CK of 5975, CT showed 9 mm intracranial hemorrhage. Patient was seen by physical therapy, had a severe back pain.  CT scan showed T12, L4 for compression fracture.   Assessment & Plan:   Principal Problem:   ICH (intracerebral hemorrhage) (HCC) Active Problems:   Hyponatremia   Hypokalemia   Rhabdomyolysis   Compression fracture of thoracic vertebra (HCC)   Compression fracture of L4 lumbar vertebra, closed, initial encounter (HCC)   Severe back pain and the left hip pain. Compression fracture between T12-L4. Patient has been evaluated by neurosurgery, MRI confirmed fracture at T12,L4.  Acute to subacute.  Hip MRI ruled out fracture. Started TLSO. Contacted neurosurgery to be seen again today. Continue pain medicine as needed.  Pending nursing home placement.   Intracranial hemorrhage. Patient evaluated by neurosurgery, stable, no indication for surgery.  Severe symptomatic hyponatremia secondary to beer potomania. Hypokalemia. Traumatic rhabdomyolysis secondary to fall. Elevated troponin secondary to rhabdomyolysis. Improved.  Sodium level still low today at 130, start sodium tablets 1 g twice a day, fluid restriction.     Hypertension urgency. Continue current treatment.  Alcohol abuse. Alcoholic hepatitis. No alcohol withdrawal.     DVT prophylaxis: SCDs Code Status: full Family Communication:  Disposition Plan: Patient may need nursing home placement.  Status is: Inpatient   Remains inpatient appropriate because: Due to severity of disease.      I/O last 3  completed shifts: In: 4743.9 [P.O.:1157; I.V.:3586.9] Out: 3200 [Urine:3200] Total I/O In: 240 [P.O.:240] Out: -      Consultants:  Neurosurgery.  Procedures: None  Antimicrobials: None  Subjective: Patient still complaining significant back pain, But seem to be better with pain medicine. Denies any abdominal pain nausea vomiting, no diarrhea. No fever chills pain No short of breath or cough.  Objective: Vitals:   08/20/21 2353 08/21/21 0450 08/21/21 0747 08/21/21 1144  BP: (!) 139/94 131/87 117/87 129/82  Pulse: 98 84 100 81  Resp: 16 17 16 14   Temp: 97.8 F (36.6 C) 97.7 F (36.5 C) 97.9 F (36.6 C) 97.9 F (36.6 C)  TempSrc: Oral Oral Oral   SpO2: 95% 93% 91% 97%  Weight:      Height:        Intake/Output Summary (Last 24 hours) at 08/21/2021 1240 Last data filed at 08/21/2021 0800 Gross per 24 hour  Intake 917 ml  Output 1750 ml  Net -833 ml   Filed Weights   08/15/21 0537  Weight: 86.2 kg    Examination:  General exam: Appears calm and comfortable  Respiratory system: Clear to auscultation. Respiratory effort normal. Cardiovascular system: S1 & S2 heard, RRR. No JVD, murmurs, rubs, gallops or clicks. No pedal edema. Gastrointestinal system: Abdomen is nondistended, soft and nontender. No organomegaly or masses felt. Normal bowel sounds heard. Central nervous system: Alert and oriented. No focal neurological deficits. Extremities: Symmetric 5 x 5 power. Skin: No rashes, lesions or ulcers Psychiatry: Judgement and insight appear normal. Mood & affect appropriate.     Data Reviewed: I have personally reviewed following labs and imaging studies  CBC: Recent Labs  Lab 08/15/21 0543 08/16/21 0710 08/17/21 0253 08/18/21 0504 08/19/21 0515  WBC 14.8* 7.8 10.0 7.4 6.4  NEUTROABS 13.3*  --  7.5 5.3 4.5  HGB 15.3 13.9 13.1 13.7 14.3  HCT 41.3 39.0 36.5* 39.0 42.1  MCV 88.8 92.4 91.7 94.4 96.1  PLT 181 153 171 190 850   Basic Metabolic  Panel: Recent Labs  Lab 08/16/21 0858 08/16/21 1438 08/17/21 0253 08/17/21 0639 08/17/21 1648 08/18/21 0504 08/19/21 0515 08/20/21 0537 08/21/21 0550  NA 124*   < > 124*   < > 127* 130* 132* 133* 130*  K  --   --  3.1*  --   --  3.8 3.7 3.8 3.6  CL  --   --  94*  --   --  98 99 102 99  CO2  --   --  24  --   --  25 25 24 26   GLUCOSE  --   --  96  --   --  91 91 94 114*  BUN  --   --  <5*  --   --  <5* <5* <5* <5*  CREATININE  --   --  0.40*  --   --  0.44* 0.53* 0.44* 0.40*  CALCIUM  --   --  7.8*  --   --  8.3* 8.6* 8.4* 8.6*  MG 1.9  --  1.9  --   --  1.9 1.9 1.8  --   PHOS  --   --  2.6  --   --   --  3.5  --   --    < > = values in this interval not displayed.   GFR: Estimated Creatinine Clearance: 99.9 mL/min (A) (by C-G formula based on SCr of 0.4 mg/dL (L)). Liver Function Tests: Recent Labs  Lab 08/15/21 0543  AST 144*  ALT 48*  ALKPHOS 93  BILITOT 2.2*  PROT 6.7  ALBUMIN 3.3*   Recent Labs  Lab 08/15/21 0543  LIPASE 24   No results for input(s): AMMONIA in the last 168 hours. Coagulation Profile: No results for input(s): INR, PROTIME in the last 168 hours. Cardiac Enzymes: Recent Labs  Lab 08/17/21 0253 08/18/21 0504 08/19/21 0515 08/20/21 0537 08/21/21 0550  CKTOTAL 1,507* 1,750* 1,882* 890* 450*   BNP (last 3 results) No results for input(s): PROBNP in the last 8760 hours. HbA1C: No results for input(s): HGBA1C in the last 72 hours. CBG: No results for input(s): GLUCAP in the last 168 hours. Lipid Profile: No results for input(s): CHOL, HDL, LDLCALC, TRIG, CHOLHDL, LDLDIRECT in the last 72 hours. Thyroid Function Tests: No results for input(s): TSH, T4TOTAL, FREET4, T3FREE, THYROIDAB in the last 72 hours. Anemia Panel: No results for input(s): VITAMINB12, FOLATE, FERRITIN, TIBC, IRON, RETICCTPCT in the last 72 hours. Sepsis Labs: No results for input(s): PROCALCITON, LATICACIDVEN in the last 168 hours.  Recent Results (from the past 240  hour(s))  Resp Panel by RT-PCR (Flu A&B, Covid) Nasopharyngeal Swab     Status: None   Collection Time: 08/15/21  5:43 AM   Specimen: Nasopharyngeal Swab; Nasopharyngeal(NP) swabs in vial transport medium  Result Value Ref Range Status   SARS Coronavirus 2 by RT PCR NEGATIVE NEGATIVE Final    Comment: (NOTE) SARS-CoV-2 target nucleic acids are NOT DETECTED.  The SARS-CoV-2 RNA is generally detectable in upper respiratory specimens during the acute phase of infection. The lowest concentration of SARS-CoV-2 viral copies this assay can detect is 138 copies/mL. A  negative result does not preclude SARS-Cov-2 infection and should not be used as the sole basis for treatment or other patient management decisions. A negative result may occur with  improper specimen collection/handling, submission of specimen other than nasopharyngeal swab, presence of viral mutation(s) within the areas targeted by this assay, and inadequate number of viral copies(<138 copies/mL). A negative result must be combined with clinical observations, patient history, and epidemiological information. The expected result is Negative.  Fact Sheet for Patients:  EntrepreneurPulse.com.au  Fact Sheet for Healthcare Providers:  IncredibleEmployment.be  This test is no t yet approved or cleared by the [place] FDA and  has been authorized for detection and/or diagnosis of SARS-CoV-2 by FDA under an Emergency Use Authorization (EUA). This EUA will remain  in effect (meaning this test can be used) for the duration of the COVID-19 declaration under Section 564(b)(1) of the Act, 21 U.S.C.section 360bbb-3(b)(1), unless the authorization is terminated  or revoked sooner.       Influenza A by PCR NEGATIVE NEGATIVE Final   Influenza B by PCR NEGATIVE NEGATIVE Final    Comment: (NOTE) The Xpert Xpress SARS-CoV-2/FLU/RSV plus assay is intended as an aid in the diagnosis of influenza from  Nasopharyngeal swab specimens and should not be used as a sole basis for treatment. Nasal washings and aspirates are unacceptable for Xpert Xpress SARS-CoV-2/FLU/RSV testing.  Fact Sheet for Patients: EntrepreneurPulse.com.au  Fact Sheet for Healthcare Providers: IncredibleEmployment.be  This test is not yet approved or cleared by the [place] FDA and has been authorized for detection and/or diagnosis of SARS-CoV-2 by FDA under an Emergency Use Authorization (EUA). This EUA will remain in effect (meaning this test can be used) for the duration of the COVID-19 declaration under Section 564(b)(1) of the Act, 21 U.S.C. section 360bbb-3(b)(1), unless the authorization is terminated or revoked.  Performed at [facility], [address] [place], [place] [address]          Radiology Studies: No results found.      Scheduled Meds:  allopurinol  300 mg Oral Daily   folic acid  1 mg Oral Daily   multivitamin with minerals  1 tablet Oral Daily   omega-3 acid ethyl esters   Oral Daily   senna-docusate  2 tablet Oral BID   sodium chloride  1 g Oral BID WC   thiamine  100 mg Oral Daily   Or   thiamine  100 mg Intravenous Daily   Vitamin D (Ergocalciferol)  50,000 Units Oral Weekly   Continuous Infusions:   LOS: 6 days    Time spent: 27 minutes    Sharen Hones, MD [facility]   To contact the attending provider between 7A-7P or the covering provider during after hours 7P-7A, please log into the web site [email or web address] and access using universal [place] password for that web site. If you do not have the password, please call the hospital operator.  08/21/2021, 12:40 PM

## 2021-08-21 NOTE — Care Management Important Message (Signed)
Important Message  Patient Details  Name: Lance House MRN: [id number] Date of Birth: 1951/03/20   Medicare Important Message Given:  Yes     Dannette Barbara 08/21/2021, 11:41 AM

## 2021-08-21 NOTE — TOC Progression Note (Addendum)
Transition of Care [facility]) - Progression Note    Patient Details  Name: Lance House MRN: [id number] Date of Birth: 11/25/1950  Transition of Care [facility]) CM/SW [place], [place] Phone Number: 08/21/2021, 12:14 PM  Clinical Narrative:     Requested documents sent to [facility] must to obtain PASRR number.   Referrals faxed out to facilities pending bed offers at this time.   Update: PASRR number received [id number]   Expected Discharge Plan: [place] Barriers to Discharge: Continued Medical Work up  Expected Discharge Plan and Services Expected Discharge Plan: [place] Choice: [place] arrangements for the past 2 months: Mobile Home                                       Social Determinants of Health (SDOH) Interventions    Readmission Risk Interventions No flowsheet data found.

## 2021-08-21 NOTE — Progress Notes (Addendum)
Physical Therapy Treatment Patient Details Name: Lance House MRN: [id number] DOB: 1950-09-02 Today's Date: 08/21/2021   History of Present Illness Pt is a 71 yo male with who presented to the ED with acute onset of generalized weakness and a fall in the kitchen in front of the refrigerator.  Patient had a sodium of 112, CK of 5975, CT showed 9 mm intracranial hemorrhage. PmHx: anxiety, depression, gout, and chronic alcohol use.    PT Comments    Pt asleep in bed upon PT entrance into room, but woke to voice and tactile cues. He reports 10/10 pain at rest. He is able to perform bed mobility w/ Supervision, but relies heavily on usage of bed rail for log rolling technique due to pain level. Once seated at EOB today, Pt was educated and instructed on the usage and wearing of TLSO to ensure following mobility and OOB activity protocol. He required verbal cueing on appropriate donning and doffing of the TLSO. Pt expressed some understanding of this education, and it should continue to be reinforced w/ subsequent sessions. Pt was able to perform sit to stand w/ minA using RW and was able to take a few side-steps to transfer to the recliner with CGA. He reported no change in pain level throughout this session. Pt will benefit from continued skilled PT to improve LE strength, mobility, gait, decrease c/o pain, and restore PLOF. Current discharge recommendation remains appropriate due to the level of assistance required by the patient to ensure safety and improve overall function.   Recommendations for follow up therapy are one component of a multi-disciplinary discharge planning process, led by the attending physician.  Recommendations may be updated based on patient status, additional functional criteria and insurance authorization.  Follow Up Recommendations  Skilled nursing-short term rehab (<3 hours/day)     Assistance Recommended at Discharge Intermittent Supervision/Assistance  Patient can return  home with the following A lot of help with walking and/or transfers;A lot of help with bathing/dressing/bathroom;Assistance with feeding;Assist for transportation;Assistance with cooking/housework   Equipment Recommendations  Rolling walker (2 wheels)    Recommendations for Other Services       Precautions / Restrictions Precautions Precautions: Fall Restrictions Weight Bearing Restrictions: No     Mobility  Bed Mobility Overal bed mobility: Needs Assistance Bed Mobility: Supine to Sit     Supine to sit: Supervision     General bed mobility comments: increased time/effort; reliance on log    Transfers Overall transfer level: Needs assistance Equipment used: Rolling walker (2 wheels) Transfers: Sit to/from Stand, Bed to chair/wheelchair/BSC Sit to Stand: Min guard   Step pivot transfers: Min guard       General transfer comment: bed elevated, VC for hand placement    Ambulation/Gait                   Stairs             Wheelchair Mobility    Modified Rankin (Stroke Patients Only)       Balance Overall balance assessment: Needs assistance Sitting-balance support: Bilateral upper extremity supported, Feet supported Sitting balance-Leahy Scale: Fair     Standing balance support: Bilateral upper extremity supported, Reliant on assistive device for balance Standing balance-Leahy Scale: Fair                              Cognition Arousal/Alertness: Awake/alert Behavior During Therapy: WFL for tasks assessed/performed Overall Cognitive Status: Within  Functional Limits for tasks assessed                                          Exercises Other Exercises Other Exercises: Pt instructed on usage of TLSO and educated on protocol for doning w/ mobility and OOB activities.    General Comments        Pertinent Vitals/Pain Pain Assessment Pain Assessment: 0-10 Pain Score: 10-Worst pain ever Pain Descriptors /  Indicators: Grimacing, Discomfort, Aching, Moaning Pain Intervention(s): Limited activity within patient's tolerance, Monitored during session, Repositioned, Patient requesting pain meds-RN notified    Home Living                          Prior Function            PT Goals (current goals can now be found in the care plan section) Progress towards PT goals: Progressing toward goals    Frequency    Min 2X/week      PT Plan Current plan remains appropriate    Co-evaluation              AM-PAC PT "6 Clicks" Mobility   Outcome Measure  Help needed turning from your back to your side while in a flat bed without using bedrails?: A Little Help needed moving from lying on your back to sitting on the side of a flat bed without using bedrails?: A Little Help needed moving to and from a bed to a chair (including a wheelchair)?: A Little Help needed standing up from a chair using your arms (e.g., wheelchair or bedside chair)?: A Little Help needed to walk in hospital room?: A Lot Help needed climbing 3-5 steps with a railing? : A Lot 6 Click Score: 16    End of Session Equipment Utilized During Treatment: Gait belt Activity Tolerance: Patient tolerated treatment well;Patient limited by pain Patient left: in chair;with call bell/phone within reach;with chair alarm set Nurse Communication: Mobility status PT Visit Diagnosis: Unsteadiness on feet (R26.81);History of falling (Z91.81);Muscle weakness (generalized) (M62.81);Pain Pain - Right/Left:  (midline) Pain - part of body:  (back/hip pain)     Time: 1914-7829 PT Time Calculation (min) (ACUTE ONLY): 27 min  Charges:                         Jonnie Kind, SPT 08/21/2021, 11:56 AM

## 2021-08-21 NOTE — Progress Notes (Signed)
OT Cancellation Note  Patient Details Name: Lance House MRN: [id number] DOB: 17-Dec-1950   Cancelled Treatment:    Reason Eval/Treat Not Completed: Fatigue/lethargy limiting ability to participate;Pain limiting ability to participate. Pt sleeping upon attempt, wakes easily to verbal cues. Pt endorsing just back to bed and having 10/10 pain. Received pain medication at 8am. Pt declined OT tx at this time 2/2 pain and fatigue. Will re-attempt at later date/time as able.   Ardeth Perfect., MPH, MS, OTR/L ascom [phone] 08/21/21, 10:25 AM

## 2021-08-22 LAB — BASIC METABOLIC PANEL
Anion gap: 8 (ref 5–15)
BUN: 6 mg/dL — ABNORMAL LOW (ref 8–23)
CO2: 22 mmol/L (ref 22–32)
Calcium: 8.3 mg/dL — ABNORMAL LOW (ref 8.9–10.3)
Chloride: 100 mmol/L (ref 98–111)
Creatinine, Ser: 0.39 mg/dL — ABNORMAL LOW (ref 0.61–1.24)
GFR, Estimated: >60 mL/min (ref >60)
Glucose, Bld: 102 mg/dL — ABNORMAL HIGH (ref 70–99)
Potassium: 3.6 mmol/L (ref 3.5–5.1)
Sodium: 130 mmol/L — ABNORMAL LOW (ref 135–145)

## 2021-08-22 LAB — MAGNESIUM: Magnesium: 2 mg/dL (ref 1.7–2.4)

## 2021-08-22 MED ORDER — SENNOSIDES-DOCUSATE SODIUM 8.6-50 MG PO TABS: 2.0000 | ORAL_TABLET | Freq: Two times a day (BID) | ORAL | Status: DC

## 2021-08-22 MED ORDER — OXYCODONE HCL 5 MG PO TABS
15.0000 mg | ORAL_TABLET | ORAL | Status: DC | PRN
  Administered 2021-08-22 – 2021-08-24 (×11): 15 mg via ORAL
  Filled 2021-08-22 (×11): qty 3

## 2021-08-22 MED ORDER — OXYCODONE HCL 10 MG PO TABS: 10.0000 mg | ORAL_TABLET | ORAL | 0 refills | Status: DC | PRN

## 2021-08-22 MED ORDER — CLONAZEPAM 1 MG PO TABS: 0.5000 mg | ORAL_TABLET | Freq: Two times a day (BID) | ORAL | 0 refills | Status: DC | PRN

## 2021-08-22 MED ORDER — SODIUM CHLORIDE 1 G PO TABS: 1.0000 g | ORAL_TABLET | Freq: Two times a day (BID) | ORAL | 0 refills | Status: AC

## 2021-08-22 NOTE — Progress Notes (Addendum)
Patient sitting up in chair awaiting EMS for transportation. Patient eating crackers and peanut butter. Chair alarm intact and working appropriately. Reminded patient to push call bell when help is needed. No c/o pain. Belongings nearby. Call light within reach.  Ambulated in room with crutch to door. Alert and oriented x 4. Forgetful at times. Was noted knocking on another patient's room, confused.  2215- Assisted patient to bathroom. Use walker. Tolerated fine. Stood at toilet to urinate. Chair alarm placed back on patient.  2330- Patient chair alarm going off. Patient standing at bedside saying he just wanted to stretch his legs. Patient high fall risk for fall admission. Patient often forgets to call for assistance when he wants to get up. Gripper socks on. Belongings within reach. Patient denies wanting to get in bed right now. Chair alarm on and working.   0006- Patient keeps standing up setting off chair alarm. When nurse gets to room, he sits down and he appears frustrated at the chair alarm. Patient asked this nurse for a cigarette, he says to "go in the kitchen in the cabinet and its where his wife keeps the cigarettes." Reoriented patient that we are in the hospital and there is a no smoking policy.

## 2021-08-22 NOTE — Progress Notes (Signed)
Occupational Therapy Treatment Patient Details Name: Lance House MRN: [id number] DOB: 19-Apr-1951 Today's Date: 08/22/2021   History of present illness Pt is a 71 yo male with who presented to the ED with acute onset of generalized weakness and a fall in the kitchen in front of the refrigerator.  Patient had a sodium of 112, CK of 5975, CT showed 9 mm intracranial hemorrhage. PmHx: anxiety, depression, gout, and chronic alcohol use.   OT comments  Pt seen for OT tx this date. Pt agreeable to get out of bed to the recliner for lunch later. Pt noted with urine soaked bed and pt endorses that the male [place] system was leaking but had not notified nursing for assistance. Pt performed bed mobility with supervision and +time/effort 2/2 pain. CGA for ADL transfers from EOB to recliner +RW and MIN VC for hand placement to improve technique/safety. With encouragement after education into benefits, pt agreeable for assist with cleansing skin to remove urine, pt required MAX A in standing with BUE support on RW for stability. Pt encouraged to notify nursing of future leaks. Nursing notified at end of session. Pt progressing towards goals, and continues to benefit from skilled OT services. Continue to recommend SNF At this time.    Recommendations for follow up therapy are one component of a multi-disciplinary discharge planning process, led by the attending physician.  Recommendations may be updated based on patient status, additional functional criteria and insurance authorization.    Follow Up Recommendations  Skilled nursing-short term rehab (<3 hours/day)    Assistance Recommended at Discharge Frequent or constant Supervision/Assistance  Patient can return home with the following  A lot of help with bathing/dressing/bathroom;Help with stairs or ramp for entrance;Assist for transportation;Assistance with cooking/housework;Direct supervision/assist for medications management;A little help with walking  and/or transfers   Equipment Recommendations  BSC/3in1    Recommendations for Other Services      Precautions / Restrictions Precautions Precautions: Fall Restrictions Weight Bearing Restrictions: No       Mobility Bed Mobility Overal bed mobility: Needs Assistance Bed Mobility: Supine to Sit     Supine to sit: Supervision     General bed mobility comments: increased time/effort, use of bed rails    Transfers Overall transfer level: Needs assistance Equipment used: Rolling walker (2 wheels) Transfers: Sit to/from Stand, Bed to chair/wheelchair/BSC Sit to Stand: Min guard     Step pivot transfers: Min guard     General transfer comment: VC for RW mgt     Balance Overall balance assessment: Needs assistance Sitting-balance support: No upper extremity supported, Feet supported Sitting balance-Leahy Scale: Fair     Standing balance support: Bilateral upper extremity supported, Reliant on assistive device for balance Standing balance-Leahy Scale: Fair                             ADL either performed or assessed with clinical judgement   ADL Overall ADL's : Needs assistance/impaired                             Toileting- Clothing Manipulation and Hygiene: Sit to/from stand;Maximal assistance Toileting - Clothing Manipulation Details (indicate cue type and reason): MAX A for pericare and cleansing buttocks/legs from urine in bed, soaked sacral foam pad removed. RN notified.     Functional mobility during ADLs: Min guard;Rolling walker (2 wheels);Cueing for safety      Extremity/Trunk Assessment  Vision       Perception     Praxis      Cognition Arousal/Alertness: Awake/alert Behavior During Therapy: WFL for tasks assessed/performed Overall Cognitive Status: Within Functional Limits for tasks assessed                                 General Comments: encouragement needed to get cleaned up after  finding him in urine soaked bed        Exercises Other Exercises Other Exercises: Pt educated in benefits of TLSO (refused for bed to recliner) and cleansing the skin of urine to minimize risk of skin breakdown.    Shoulder Instructions       General Comments      Pertinent Vitals/ Pain       Pain Assessment Pain Assessment: 0-10 Pain Score: 10-Worst pain ever Pain Location: Low back/L Hip Pain Descriptors / Indicators: Grimacing, Discomfort, Aching, Moaning Pain Intervention(s): Limited activity within patient's tolerance, Monitored during session, Premedicated before session, Repositioned  Home Living                                          Prior Functioning/Environment              Frequency  Min 2X/week        Progress Toward Goals  OT Goals(current goals can now be found in the care plan section)  Progress towards OT goals: Progressing toward goals  Acute Rehab OT Goals Patient Stated Goal: to have less pain OT Goal Formulation: With patient Time For Goal Achievement: 08/31/21 Potential to Achieve Goals: Good  Plan Discharge plan remains appropriate;Frequency remains appropriate    Co-evaluation                 AM-PAC OT "6 Clicks" Daily Activity     Outcome Measure   Help from another person eating meals?: None Help from another person taking care of personal grooming?: None Help from another person toileting, which includes using toliet, bedpan, or urinal?: A Little Help from another person bathing (including washing, rinsing, drying)?: A Little Help from another person to put on and taking off regular upper body clothing?: None Help from another person to put on and taking off regular lower body clothing?: A Little 6 Click Score: 21    End of Session Equipment Utilized During Treatment: Rolling walker (2 wheels)  OT Visit Diagnosis: Other abnormalities of gait and mobility (R26.89);Muscle weakness (generalized)  (M62.81);Pain Pain - Right/Left:  (back)   Activity Tolerance Patient tolerated treatment well   Patient Left in chair;with call bell/phone within reach;with chair alarm set   Nurse Communication Mobility status;Other (comment) (urine soaked bed stripped, mattress wiped down, needs new male purewick system (his was leaking) and needs bath)        Time: 1132-1150 OT Time Calculation (min): 18 min  Charges: OT General Charges $OT Visit: 1 Visit OT Treatments $Self Care/Home Management : 8-22 mins  Ardeth Perfect., MPH, MS, OTR/L ascom [phone] 08/22/21, 12:20 PM

## 2021-08-22 NOTE — Progress Notes (Signed)
Mobility Specialist - Progress Note   08/22/21 1515  Mobility  Activity Ambulated with assistance to bathroom;Ambulated with assistance in room  Level of Assistance Minimal assist, patient does 75% or more  Assistive Device Front wheel walker  Distance Ambulated (ft) 20 ft  Activity Response Tolerated well  $Mobility charge 1 Mobility     Mobility responded to chair alarm. Upon arrival, pt standing at foot of bed ambulating towards bathroom. Noted pt disconnected condom cath. MinA for stability + vc for safe body-RW distance and hand placement. After urinal output, pt repeats "something don't feel right" and rests forearms on RW---but could not clarify further. Pt's legs began to give out and student RN able to provide chair for seated rest break. NT and RN entered, VS checked and stable. Pt ambulated back to chair with alarm set. Pt anticipating d/c today.    Kathee Delton Mobility Specialist 08/22/21, 3:20 PM

## 2021-08-22 NOTE — Progress Notes (Signed)
Report called to [facility] at [facility].

## 2021-08-22 NOTE — Discharge Summary (Signed)
Physician Discharge Summary  Patient ID: Lance House MRN: [id number] DOB/AGE: 1951/05/26 71 y.o.  Admit date: 08/15/2021 Discharge date: 08/22/2021  Admission Diagnoses:  Discharge Diagnoses:  Principal Problem:   ICH (intracerebral hemorrhage) ([place]) Active Problems:   Hyponatremia   Hypokalemia   Rhabdomyolysis   Compression fracture of thoracic vertebra (HCC)   Compression fracture of L4 lumbar vertebra, closed, initial encounter [facility])   Discharged Condition: fair  Hospital Course:  Lance House is a 71 y.o. Caucasian male with medical history significant for anxiety, depression and gout who presented to the emergency room with acute onset of generalized weakness and a fall in the kitchen in front of the refrigerator.  Patient had a sodium of 112, CK of 5975, CT showed 9 mm intracranial hemorrhage. Patient was seen by physical therapy, had a severe back pain.  CT scan showed T12, L4 for compression fracture.       Consults:  Neurosurgery.  Significant Diagnostic Studies:  Severe back pain and the left hip pain. Compression fracture between T12-L4. Patient has been evaluated by neurosurgery, MRI confirmed fracture at T12,L4.  Acute to subacute.  Hip MRI ruled out fracture. Started TLSO. Discussed with Dr. Izora Ribas, who has reviewed MRI results, does not recommend surgery.  Patient will follow up with him in 1 month in office. Continue pain medicine as needed.    Intracranial hemorrhage. Patient evaluated by neurosurgery, stable, no indication for surgery.  Severe symptomatic hyponatremia secondary to beer potomania. Hypokalemia. Traumatic rhabdomyolysis secondary to fall. Elevated troponin secondary to rhabdomyolysis. Improved.  Sodium level still low today at 130, start sodium tablets 1 g twice a day, fluid restriction.     Hypertension urgency. Continue current treatment.  Alcohol abuse. Alcoholic hepatitis. No alcohol withdrawal. Treatments: Pain meds,  IVF  Discharge Exam: Blood pressure 120/81, pulse 85, temperature 98.6 F (37 C), resp. rate 18, height 6\' 1"  (1.854 m), weight 86.2 kg, SpO2 94 %. General appearance: alert and cooperative Resp: clear to auscultation bilaterally Cardio: regular rate and rhythm, S1, S2 normal, no murmur, click, rub or gallop GI: soft, non-tender; bowel sounds normal; no masses,  no organomegaly Extremities: extremities normal, atraumatic, no cyanosis or edema  Disposition: Discharge disposition: 03-Skilled Nursing Facility       Discharge Instructions     Diet - low sodium heart healthy   Complete by: As directed    Diet general   Complete by: As directed    Low fat diet, fluid less than 1576ml/day   Increase activity slowly   Complete by: As directed       Allergies as of 08/22/2021       Reactions   Ciprocin-fluocin-procin [fluocinolone] Anaphylaxis   Hip hurt   Ciprofloxacin    Colchicine    Duloxetine Diarrhea, Nausea And Vomiting, Other (See Comments)   Altered mental status        Medication List     STOP taking these medications    ibuprofen 800 MG tablet Commonly known as: ADVIL   Omega-3 1000 MG Caps       TAKE these medications    albuterol 108 (90 Base) MCG/ACT inhaler Commonly known as: VENTOLIN HFA Albuterol Sulfate HFA 108 (90 Base) MCG/ACT Inhalation Aerosol Solution QTY: 18 gram Days: 30 Refills: 5  Written: 07/08/19 Patient Instructions: inhale 1-2 puffs every 4-6 hours for shortness of breath   allopurinol 300 MG tablet Commonly known as: ZYLOPRIM Take 300 mg by mouth daily.   clonazePAM 1 MG tablet Commonly known  as: KLONOPIN Take 0.5 tablets (0.5 mg total) by mouth 2 (two) times daily as needed. What changed: how much to take   Oxycodone HCl 10 MG Tabs Take 1 tablet (10 mg total) by mouth every 4 (four) hours as needed. What changed: when to take this   senna-docusate 8.6-50 MG tablet Commonly known as: Senokot-S Take 2 tablets by mouth 2  (two) times daily.   sodium chloride 1 g tablet Take 1 tablet (1 g total) by mouth 2 (two) times daily with a meal for 7 days.   Vitamin D (Ergocalciferol) 1.25 MG (50000 UNIT) Caps capsule Commonly known as: DRISDOL Take 1 capsule by mouth once a week.        Follow-up Information     [facility] Follow up in 1 week(s).   Contact information: [place] [address] [phone]         Meade Maw, MD Follow up in 1 month(s).   Specialty: Neurosurgery Contact information: [place] [address] [phone]                35 minutes Signed: Sharen Hones 08/22/2021, 1:42 PM

## 2021-08-22 NOTE — TOC Progression Note (Addendum)
Transition of Care [facility]) - Progression Note    Patient Details  Name: Laquentin Loudermilk MRN: [id number] Date of Birth: 11/16/1950  Transition of Care [facility]) CM/SW [place],  Phone Number: 08/22/2021, 9:31 AM  Clinical Narrative:     Update 2:17: Wife informed of updated bed offer from [facility], accepts. EMS has been called for [facility].RN made aware  Update: Patient has bed offers from [place] and [facility] that can take today, CSW informed patient and wife. Wife requested offers be faxed to her at [phone] for her to choose. Reports she will call CSW back.   Patient continues to have no bed offers CSW has expanded search, pending offers at this time.   Expected Discharge Plan: [place] Barriers to Discharge: Continued Medical Work up  Expected Discharge Plan and Services Expected Discharge Plan: [place] Choice: [place] arrangements for the past 2 months: Mobile Home                                       Social Determinants of Health (SDOH) Interventions    Readmission Risk Interventions No flowsheet data found.

## 2021-08-22 NOTE — Progress Notes (Signed)
PIV removed. Discharge packet placed in packet for receiving facility. Patient belongings gathered and packed to discharge.

## 2021-08-22 NOTE — TOC Transition Note (Addendum)
Transition of Care [facility]) - CM/SW Discharge Note   Patient Details  Name: Elzie Knisley MRN: [id number] Date of Birth: 1951/06/25  Transition of Care [facility]) CM/SW Contact:  Alberteen Sam, LCSW Phone Number: 08/22/2021, 1:46 PM   Clinical Narrative:      Patient will DC to: [facility] Anticipated DC date:08/22/21 Family notified: spouse Transport by: [facility]  Per MD patient ready for DC to  [facility]. RN, patient, patient's family, and facility notified of DC. Discharge Summary sent to facility. RN given number for report [phone] Room 224. DC packet on chart. Ambulance transport requested for patient.  CSW signing off.  [place], [place]  Final next level of care: Skilled Nursing Facility Barriers to Discharge: No Barriers Identified   Patient Goals and CMS Choice Patient states their goals for this hospitalization and ongoing recovery are:: to go home CMS Medicare.gov Compare Post Acute Care list provided to:: Patient Choice offered to / list presented to : Patient  Discharge Placement              Patient chooses bed at:  ([facility]) Patient to be transferred to facility by: [facility] Name of family member notified: spouse Patient and family notified of of transfer: 08/22/21  Discharge Plan and Services     Post Acute Care Choice: [facility]                               Social Determinants of Health (SDOH) Interventions     Readmission Risk Interventions No flowsheet data found.

## 2021-08-23 DIAGNOSIS — S0636AA Traumatic hemorrhage of cerebrum, unspecified, with loss of consciousness status unknown, initial encounter: Secondary | ICD-10-CM

## 2021-08-23 DIAGNOSIS — F10920 Alcohol use, unspecified with intoxication, uncomplicated: Secondary | ICD-10-CM

## 2021-08-23 DIAGNOSIS — S22000A Wedge compression fracture of unspecified thoracic vertebra, initial encounter for closed fracture: Secondary | ICD-10-CM

## 2021-08-23 DIAGNOSIS — W19XXXA Unspecified fall, initial encounter: Secondary | ICD-10-CM

## 2021-08-23 NOTE — Progress Notes (Signed)
PROGRESS NOTE  Lance House    DOB: 1950/09/15, 71 y.o.  JME:[id number]  PCP: [facility]   Code Status: Full Code   DOA: 08/15/2021   LOS: 8  Brief Narrative of Current Hospitalization  Lance House is a 71 y.o. male with a PMH significant for anxiety, depression, gout. They presented from home to the ED on 08/15/2021 with weakness and a fall. In the ED, it was found that they had ICH and compression fracture of T12-L4. Neurosurgery was consulted. They were treated with TLSO. He was stable and did not require surgical intervention.  Patient was admitted to medicine service for further workup and management of ICH as outlined in detail below.  08/23/21 -stable. Patient was not transported to SNF yesterday due to transportation delays and now his insurance authorization will need to be restarted. He is medically ready for dc once SNF bed available  Assessment & Plan  Principal Problem:   ICH (intracerebral hemorrhage) (HCC) Active Problems:   Hyponatremia   Hypokalemia   Rhabdomyolysis   Compression fracture of thoracic vertebra (HCC)   Compression fracture of L4 lumbar vertebra, closed, initial encounter (HCC)  Severe back pain and the left hip pain. Compression fracture between T12-L4. Patient has been evaluated by neurosurgery, MRI confirmed fracture at T12,L4.  Acute to subacute.  Hip MRI ruled out fracture. Started TLSO. Continue pain medicine as needed.  Pending nursing home placement. - PT/OT   Intracranial hemorrhage. Patient evaluated by neurosurgery, stable, no indication for surgery.  Severe symptomatic hyponatremia secondary to beer potomania. Hypokalemia. Traumatic rhabdomyolysis secondary to fall. Elevated troponin secondary to rhabdomyolysis. Improved.  Sodium level stable around 130 - continue sodium tablets 1 g twice a day, fluid restriction.    Hypertension urgency. Continue current treatment.  Alcohol abuse. Alcoholic hepatitis. No  alcohol withdrawal.  DVT prophylaxis: SCDs Start: 08/15/21 1313   Diet:  Diet Orders (From admission, onward)     Start     Ordered   08/22/21 0000  Diet - low sodium heart healthy        08/22/21 1341   08/22/21 0000  Diet general       Comments: Low fat diet, fluid less than 1548ml/day   08/22/21 1341   08/21/21 1243  Diet Heart Room service appropriate? Yes; Fluid consistency: Thin; Fluid restriction: 1500 mL Fluid  Diet effective now       Question Answer Comment  Room service appropriate? Yes   Fluid consistency: Thin   Fluid restriction: 1500 mL Fluid      08/21/21 1242            Subjective 08/23/21    Pt reports severe pain in right leg/back which is unchanged from baseline. He states that there is no way he can go to SNF  Disposition Plan & Communication  Patient status: Inpatient  Admitted From: Home Disposition: Skilled nursing facility Anticipated discharge date: 2/2  Family Communication: none  Consults, Procedures, Significant Events  Consultants:  Neurosurgery   Procedures/significant events:  None  Antimicrobials:  Anti-infectives (From admission, onward)    None       Objective   Vitals:   08/22/21 2008 08/22/21 2301 08/23/21 0756 08/23/21 1130  BP: 100/72 91/68 138/86 116/71  Pulse: 90 80 98 86  Resp:  16 18 18   Temp: 97.7 F (36.5 C) 98.4 F (36.9 C) 97.8 F (36.6 C) 97.8 F (36.6 C)  TempSrc:      SpO2: 97% 94% 94% 93%  Weight:      Height:        Intake/Output Summary (Last 24 hours) at 08/23/2021 1352 Last data filed at 08/23/2021 1129 Gross per 24 hour  Intake 720 ml  Output 150 ml  Net 570 ml   Filed Weights   08/15/21 0537  Weight: 86.2 kg    Patient BMI: Body mass index is 25.07 kg/m.   Physical Exam: General: awake, alert, NAD HEENT: atraumatic, clear conjunctiva, anicteric sclera, MMM, hearing grossly normal Respiratory: normal respiratory effort. Cardiovascular: quick capillary refill  Gastrointestinal:  soft, NT, ND Nervous: A&O x3. no gross focal neurologic deficits, normal speech Extremities: decreased movement of right leg. Sensation intact.  Skin: dry, intact, normal temperature, normal color. No rashes, lesions or ulcers on exposed skin Psychiatry: agitated  Labs   I have personally reviewed following labs and imaging studies CBC    Component Value Date/Time   WBC 6.4 08/19/2021 0515   RBC 4.38 08/19/2021 0515   HGB 14.3 08/19/2021 0515   HCT 42.1 08/19/2021 0515   PLT 212 08/19/2021 0515   MCV 96.1 08/19/2021 0515   MCH 32.6 08/19/2021 0515   MCHC 34.0 08/19/2021 0515   RDW 13.9 08/19/2021 0515   LYMPHSABS 0.9 08/19/2021 0515   MONOABS 0.7 08/19/2021 0515   EOSABS 0.2 08/19/2021 0515   BASOSABS 0.1 08/19/2021 0515   BMP Latest Ref Rng & Units 08/22/2021 08/21/2021 08/20/2021  Glucose 70 - 99 mg/dL 102(H) 114(H) 94  BUN 8 - 23 mg/dL 6(L) <5(L) <5(L)  Creatinine 0.61 - 1.24 mg/dL 0.39(L) 0.40(L) 0.44(L)  Sodium 135 - 145 mmol/L 130(L) 130(L) 133(L)  Potassium 3.5 - 5.1 mmol/L 3.6 3.6 3.8  Chloride 98 - 111 mmol/L 100 99 102  CO2 22 - 32 mmol/L 22 26 24   Calcium 8.9 - 10.3 mg/dL 8.3(L) 8.6(L) 8.4(L)   Imaging Studies  No results found.  Medications   Scheduled Meds:  allopurinol  300 mg Oral Daily   folic acid  1 mg Oral Daily   multivitamin with minerals  1 tablet Oral Daily   omega-3 acid ethyl esters   Oral Daily   senna-docusate  2 tablet Oral BID   sodium chloride  1 g Oral BID WC   thiamine  100 mg Oral Daily   Or   thiamine  100 mg Intravenous Daily   Vitamin D (Ergocalciferol)  50,000 Units Oral Weekly   No recently discontinued medications to reconcile  LOS: 8 days   Richarda Osmond, DO [facility] 08/23/2021, 1:52 PM   Available by Epic secure chat 7AM-7PM. If 7PM-7AM, please contact night-coverage Refer to [email or web address] to contact the [facility] Attending or Consulting provider for this pt

## 2021-08-23 NOTE — Progress Notes (Signed)
PT Cancellation Note  Patient Details Name: Lance House MRN: [id number] DOB: 1951/04/15   Cancelled Treatment:    Reason Eval/Treat Not Completed: Other (comment) Pt reported in too much pain to get OOB to move to recliner and then have to get back up into bed later for the night.   Jonnie Kind, SPT 08/23/2021, 3:46 PM

## 2021-08-24 ENCOUNTER — Emergency Department: Payer: Medicare HMO

## 2021-08-24 ENCOUNTER — Emergency Department
Admission: EM | Admit: 2021-08-24 | Discharge: 2021-09-04 | Disposition: A | Discharge status: Home / Self Care | Payer: Medicare HMO | Attending: Emergency Medicine | Admitting: Emergency Medicine

## 2021-08-24 ENCOUNTER — Encounter: Payer: Self-pay | Admitting: Emergency Medicine

## 2021-08-24 ENCOUNTER — Other Ambulatory Visit: Payer: Self-pay

## 2021-08-24 DIAGNOSIS — W1839XA Other fall on same level, initial encounter: Secondary | ICD-10-CM | POA: Diagnosis not present

## 2021-08-24 DIAGNOSIS — M546 Pain in thoracic spine: Secondary | ICD-10-CM | POA: Insufficient documentation

## 2021-08-24 DIAGNOSIS — M25552 Pain in left hip: Secondary | ICD-10-CM | POA: Diagnosis not present

## 2021-08-24 DIAGNOSIS — E871 Hypo-osmolality and hyponatremia: Secondary | ICD-10-CM | POA: Insufficient documentation

## 2021-08-24 DIAGNOSIS — S32040A Wedge compression fracture of fourth lumbar vertebra, initial encounter for closed fracture: Secondary | ICD-10-CM

## 2021-08-24 DIAGNOSIS — M545 Low back pain, unspecified: Secondary | ICD-10-CM | POA: Diagnosis not present

## 2021-08-24 DIAGNOSIS — Z20822 Contact with and (suspected) exposure to covid-19: Secondary | ICD-10-CM | POA: Diagnosis not present

## 2021-08-24 DIAGNOSIS — R519 Headache, unspecified: Secondary | ICD-10-CM | POA: Diagnosis not present

## 2021-08-24 DIAGNOSIS — D72829 Elevated white blood cell count, unspecified: Secondary | ICD-10-CM | POA: Diagnosis not present

## 2021-08-24 DIAGNOSIS — S0990XA Unspecified injury of head, initial encounter: Secondary | ICD-10-CM | POA: Diagnosis not present

## 2021-08-24 DIAGNOSIS — R9082 White matter disease, unspecified: Secondary | ICD-10-CM | POA: Diagnosis not present

## 2021-08-24 DIAGNOSIS — R778 Other specified abnormalities of plasma proteins: Secondary | ICD-10-CM

## 2021-08-24 DIAGNOSIS — R7989 Other specified abnormal findings of blood chemistry: Secondary | ICD-10-CM

## 2021-08-24 DIAGNOSIS — Z9889 Other specified postprocedural states: Secondary | ICD-10-CM | POA: Diagnosis not present

## 2021-08-24 DIAGNOSIS — M549 Dorsalgia, unspecified: Secondary | ICD-10-CM

## 2021-08-24 DIAGNOSIS — R52 Pain, unspecified: Secondary | ICD-10-CM

## 2021-08-24 LAB — RESP PANEL BY RT-PCR (FLU A&B, COVID) ARPGX2
Influenza A by PCR: NEGATIVE
Influenza B by PCR: NEGATIVE
SARS Coronavirus 2 by RT PCR: NEGATIVE

## 2021-08-24 LAB — GLUCOSE, CAPILLARY: Glucose-Capillary: 98 mg/dL (ref 70–99)

## 2021-08-24 IMAGING — CR DG HIP (WITH OR WITHOUT PELVIS) 2-3V*L*
1 series · 3 of 3 positions shown · non-contrast
Comparison: Left hip MRI dated [DATE], CT dated [DATE] and
pelvis radiograph dated [DATE].

CLINICAL DATA: Left hip pain following a fall.

EXAM:
DG HIP (WITH OR WITHOUT PELVIS) 2-3V LEFT

[Series 1: dg hip unilat w or w/o pelvis 2-3 views  · non-contrast · 0.14mm/px · 3 of 3 slices shown]
[im 1/3]
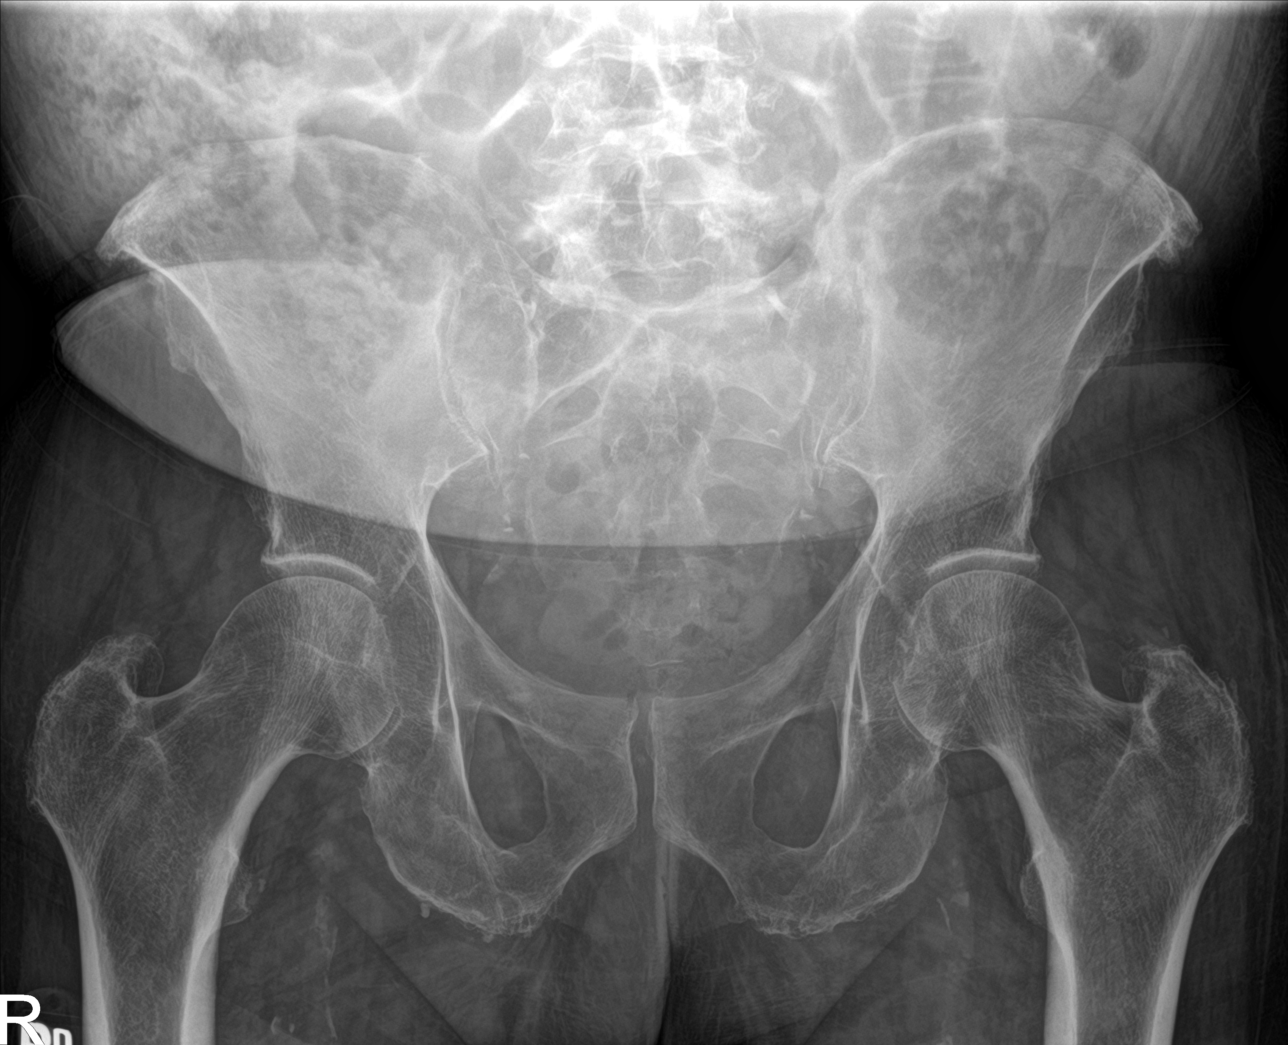
[im 2/3]
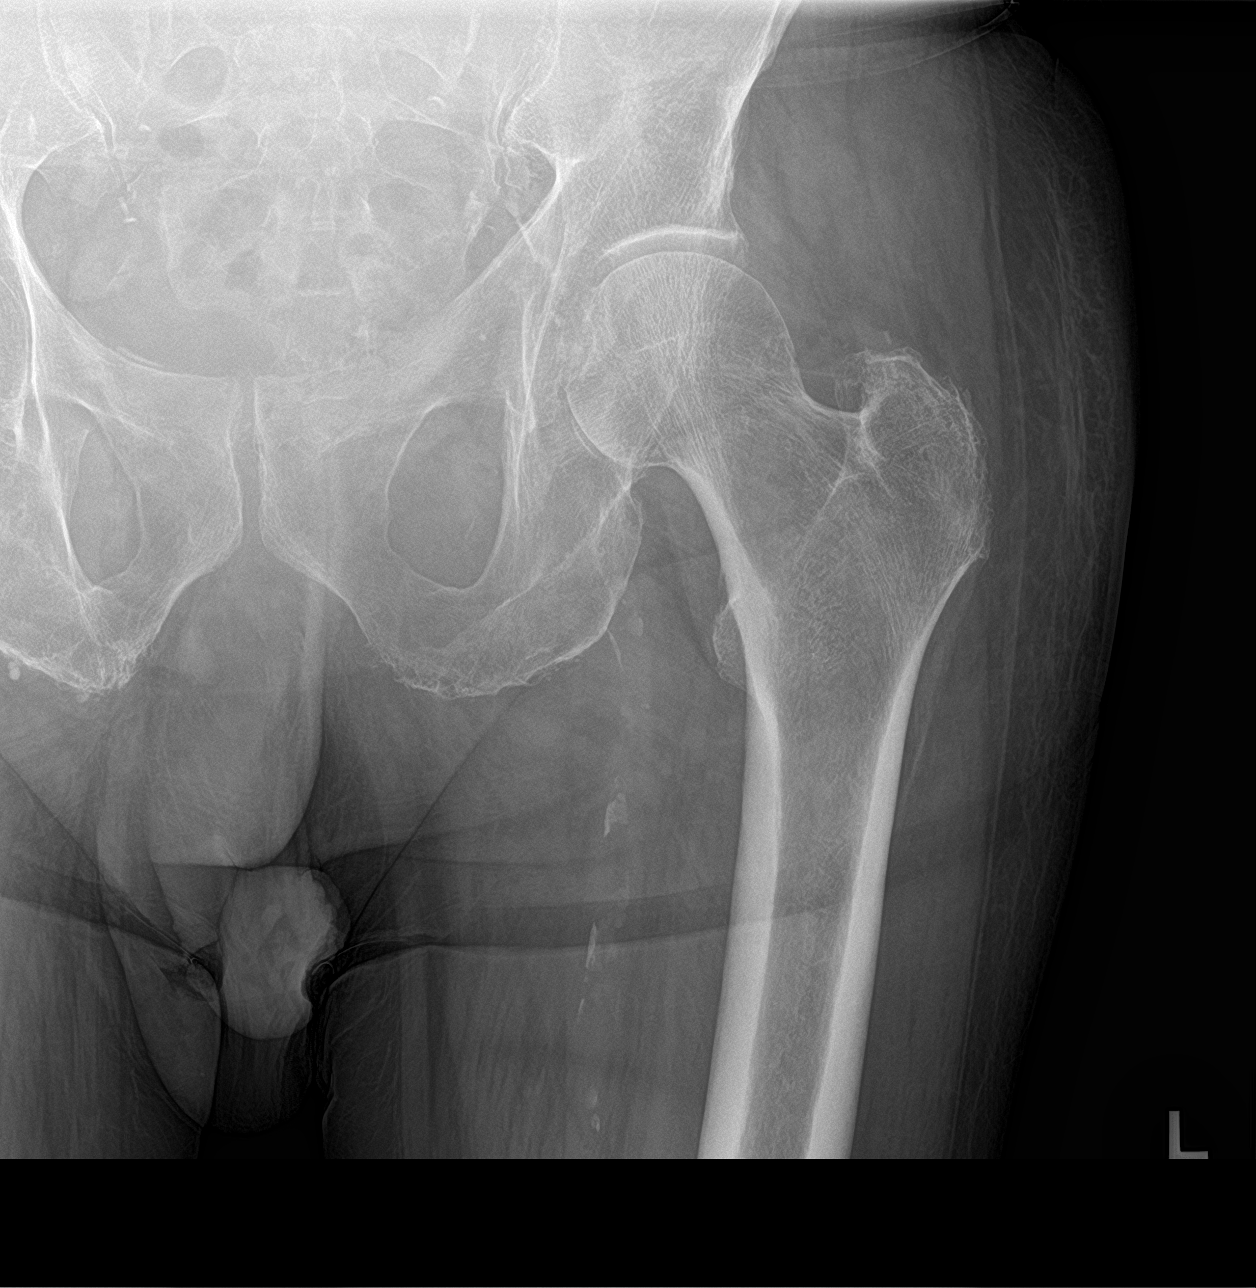
[im 3/3]
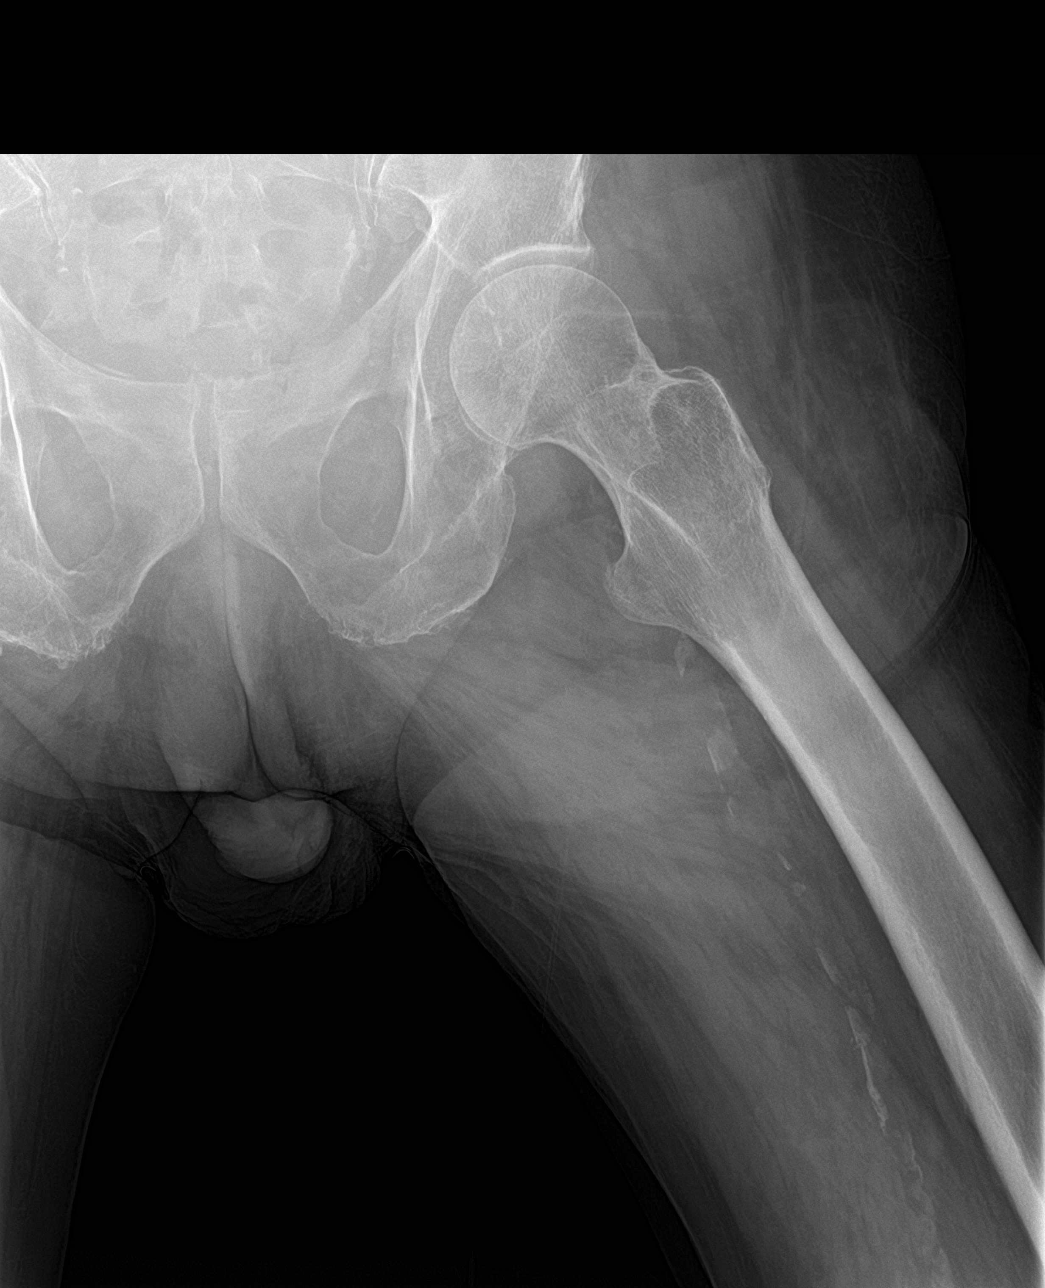

[3 of 3 positions shown; findings below may reference images not displayed]

FINDINGS: The bones appear osteopenic. No fracture or dislocation seen. Lower
lumbar spine degenerative changes with a stable L4 compression
deformity. Atheromatous arterial calcifications.
IMPRESSION: No hip fracture or dislocation seen.

## 2021-08-24 MED ORDER — VITAMIN D (ERGOCALCIFEROL) 1.25 MG (50000 UNIT) PO CAPS
50000.0000 [IU] | ORAL_CAPSULE | ORAL | Status: DC
  Administered 2021-08-25 – 2021-08-31 (×2): 50000 [IU] via ORAL
  Filled 2021-08-24 (×3): qty 1

## 2021-08-24 MED ORDER — OXYCODONE HCL 5 MG PO TABS
10.0000 mg | ORAL_TABLET | ORAL | Status: DC | PRN
  Administered 2021-08-24 – 2021-09-04 (×57): 10 mg via ORAL
  Filled 2021-08-24 (×58): qty 2

## 2021-08-24 MED ORDER — ACETAMINOPHEN 325 MG PO TABS: 650.0000 mg | ORAL_TABLET | Freq: Four times a day (QID) | ORAL | Status: DC | PRN

## 2021-08-24 MED ORDER — ALBUTEROL SULFATE (2.5 MG/3ML) 0.083% IN NEBU: 3.0000 mL | INHALATION_SOLUTION | RESPIRATORY_TRACT | Status: DC | PRN

## 2021-08-24 MED ORDER — CLONAZEPAM 0.5 MG PO TABS
0.5000 mg | ORAL_TABLET | Freq: Two times a day (BID) | ORAL | Status: DC | PRN
  Administered 2021-08-24 – 2021-09-03 (×12): 0.5 mg via ORAL
  Filled 2021-08-24 (×12): qty 1

## 2021-08-24 MED ORDER — ACETAMINOPHEN 325 MG PO TABS
650.0000 mg | ORAL_TABLET | Freq: Four times a day (QID) | ORAL | Status: DC | PRN
  Administered 2021-08-26: 325 mg via ORAL
  Administered 2021-08-27 – 2021-09-03 (×7): 650 mg via ORAL
  Filled 2021-08-24 (×8): qty 2

## 2021-08-24 MED ORDER — SODIUM CHLORIDE 1 G PO TABS
1.0000 g | ORAL_TABLET | Freq: Two times a day (BID) | ORAL | Status: DC
  Administered 2021-08-27 – 2021-09-03 (×14): 1 g via ORAL
  Filled 2021-08-24 (×26): qty 1

## 2021-08-24 MED ORDER — ALLOPURINOL 300 MG PO TABS
300.0000 mg | ORAL_TABLET | Freq: Every day | ORAL | Status: DC
  Administered 2021-08-25: 300 mg via ORAL
  Filled 2021-08-24 (×14): qty 1

## 2021-08-24 NOTE — ED Notes (Signed)
Provider at bedside at this time

## 2021-08-24 NOTE — ED Notes (Signed)
Pt assisted to the bathroom to urinate and then wheeled back to the lobby. Pt with no further needs at this time.

## 2021-08-24 NOTE — Care Management Important Message (Signed)
Important Message  Patient Details  Name: Lance House MRN: [id number] Date of Birth: 04-06-1951   Medicare Important Message Given:  Yes     Dannette Barbara 08/24/2021, 2:09 PM

## 2021-08-24 NOTE — ED Notes (Signed)
[facility] contacted about the status of the patients residency at the facility. This RN was informed by their supervising RN that the patient left AMA today from the facility and no longer has placement there.

## 2021-08-24 NOTE — TOC Transition Note (Signed)
Transition of Care [facility]) - CM/SW Discharge Note   Patient Details  Name: Lance House MRN: [id number] Date of Birth: 1951/04/13  Transition of Care [facility]) CM/SW Contact:  Alberteen Sam, LCSW Phone Number: 08/24/2021, 12:14 PM   Clinical Narrative:      Patient will DC to: [facility] Anticipated DC date: 08/24/21 Family notified: spouse Information systems manager by: [facility]  Per MD patient ready [facility] . RN, patient, patient's family, and facility notified of DC. Discharge Summary sent to facility. RN given number for report   Room [phone] 219A. DC packet on chart. Ambulance transport requested for patient.  CSW signing off.  Pricilla Riffle, LCSW   Final next level of care: Skilled Nursing Facility Barriers to Discharge: No Barriers Identified   Patient Goals and CMS Choice Patient states their goals for this hospitalization and ongoing recovery are:: to go home CMS Medicare.gov Compare Post Acute Care list provided to:: Patient Choice offered to / list presented to : Patient  Discharge Placement              Patient chooses bed at: [facility] Patient to be transferred to facility by: [facility] Name of family member notified: spouse Caren Griffins Patient and family notified of of transfer: 08/24/21  Discharge Plan and Services     Post Acute Care Choice: [place]                               Social Determinants of Health (SDOH) Interventions     Readmission Risk Interventions No flowsheet data found.

## 2021-08-24 NOTE — Discharge Summary (Signed)
Physician Discharge Summary  Patient ID: Lance House MRN: [id number] DOB/AGE: 1950/12/16 71 y.o.  Admit date: 08/15/2021 Discharge date: 08/24/2021  Admission Diagnoses: [place] Thoracic and lumbar compression fractures  Recommendations for f/u: See PCP within 1 week to monitor sodium level and screening for dementia See neurosurgery to follow up on fractures and ICH  Discharge Diagnoses:  Principal Problem:   ICH (intracerebral hemorrhage) ([place]) Active Problems:   Hyponatremia   Hypokalemia   Rhabdomyolysis   Compression fracture of thoracic vertebra (HCC)   Compression fracture of L4 lumbar vertebra, closed, initial encounter ([place])   Alcoholic intoxication without complication ([place])   Fall   Compression fracture of fourth lumbar vertebra ([place])   Elevated troponin   Pain  Discharged Condition: fair  Hospital Course:  Lance House is a 71 y.o. Caucasian male with medical history significant for anxiety, depression, EtOH dependence and gout who presented to the emergency room with acute onset of generalized weakness and a fall in the kitchen.   On presentation, patient had a sodium of 112, CK of 5975. CT chest/abd/pelv showed T12- L4 compression fractures. CT head showed 9 mm intracranial hemorrhage.  Neurosurgery evaluated the injuries which remained stable and recommended non-operative management. No follow up recommended.  Patient's main complaint throughout stay was his lower back and left hip/leg pain which he endorsed was always 10/10 and unchanged from his baseline.  Neurosurgery recommended TSLO with which patient was non-adherent.  Patient was seen by physical therapy who recommended SNF after discharge.      Consults:  Neurosurgery.  Significant Diagnostic Studies:  Severe back pain and the left hip pain. Compression fracture between T12-L4. Patient has been evaluated by neurosurgery, MRI confirmed fracture at T12,L4.  Acute to subacute.  Hip MRI ruled out  fracture. Started TLSO. Discussed with Dr. Izora Ribas, who has reviewed MRI results, does not recommend surgery.  Patient will follow up with him in 1 month in office. Continue pain medicine as needed.    Intracranial hemorrhage. Patient evaluated by neurosurgery, stable, no indication for surgery.  Severe symptomatic hyponatremia secondary to beer potomania. Hypokalemia- resolved Traumatic rhabdomyolysis secondary to fall- resolved Elevated troponin secondary to rhabdomyolysis. Improved.  Sodium level remained stable around 130. Started sodium tablets 1 g twice a day, fluid restriction for a 1 week course.    Hypertension urgency. Continue current treatment.  Alcohol abuse. Alcoholic hepatitis. No alcohol withdrawal. Treatments: Pain meds, IVF  Discharge Exam: Blood pressure 133/86, pulse 100, temperature 98.3 F (36.8 C), resp. rate (!) (P) 21, height 6\' 1"  (1.854 m), weight 86.2 kg, SpO2 95 %. General appearance: alert and cooperative Resp: clear to auscultation bilaterally Cardio: regular rate and rhythm, S1, S2 normal, no murmur, click, rub or gallop GI: soft, non-tender; bowel sounds normal; no masses,  no organomegaly Extremities: extremities normal, atraumatic, no cyanosis or edema  Disposition: Discharge disposition: 03-Skilled Nursing Facility       Discharge Instructions     Diet - low sodium heart healthy   Complete by: As directed    Diet general   Complete by: As directed    Low fat diet, fluid less than 1525ml/day   Increase activity slowly   Complete by: As directed       Allergies as of 08/24/2021       Reactions   Ciprocin-fluocin-procin [fluocinolone] Anaphylaxis   Hip hurt   Ciprofloxacin    Colchicine    Duloxetine Diarrhea, Nausea And Vomiting, Other (See Comments)   Altered mental status  Medication List     STOP taking these medications    ibuprofen 800 MG tablet Commonly known as: ADVIL   Omega-3 1000 MG Caps        TAKE these medications    acetaminophen 325 MG tablet Commonly known as: TYLENOL Take 2 tablets (650 mg total) by mouth every 6 (six) hours as needed for mild pain (or Fever >/= 101).   albuterol 108 (90 Base) MCG/ACT inhaler Commonly known as: VENTOLIN HFA Albuterol Sulfate HFA 108 (90 Base) MCG/ACT Inhalation Aerosol Solution QTY: 18 gram Days: 30 Refills: 5  Written: 07/08/19 Patient Instructions: inhale 1-2 puffs every 4-6 hours for shortness of breath   allopurinol 300 MG tablet Commonly known as: ZYLOPRIM Take 300 mg by mouth daily.   clonazePAM 1 MG tablet Commonly known as: KLONOPIN Take 0.5 tablets (0.5 mg total) by mouth 2 (two) times daily as needed. What changed: how much to take   Oxycodone HCl 10 MG Tabs Take 1 tablet (10 mg total) by mouth every 4 (four) hours as needed. What changed: when to take this   sodium chloride 1 g tablet Take 1 tablet (1 g total) by mouth 2 (two) times daily with a meal for 7 days.   Vitamin D (Ergocalciferol) 1.25 MG (50000 UNIT) Caps capsule Commonly known as: DRISDOL Take 1 capsule by mouth once a week.        Follow-up Information     [facility] Follow up in 1 week(s).   Why: Appointment scheduled for Tuesday, 08/29/21 at 9:45 with Georgian Co, NP Contact information: [place] [address] [phone]         Meade Maw, MD Follow up in 1 month(s).   Specialty: Neurosurgery Why: Appointment already scheduled for 09/12/2021 at 10:30am with [facility] information: [address] [place] [address] [phone]                35 minutes Signed: Richarda Osmond 08/24/2021, 12:23 PM

## 2021-08-24 NOTE — ED Triage Notes (Signed)
Pt to ED via [facility] from [facility] for fall. Patient was discharged this facility approx 2 hours ago to [facility] for rehab. Pt states that facility told him "there is nothing we can do for your fractured hip and you need to go back to the hospital." During this time at the facility patient said he fell onto hip while getting off bed. Patient is Aox4 at this time.

## 2021-08-24 NOTE — TOC Progression Note (Signed)
Transition of Care [facility]) - Progression Note    Patient Details  Name: Lance House MRN: [id number] Date of Birth: 1951-03-07  Transition of Care [facility]) CM/SW [place], [place] Phone Number: 08/24/2021, 8:53 AM  Clinical Narrative:     CSW has reached out to Neoma Laming at [facility] to inquire as to [employer] authorization. Auth Pending at this time.   Expected Discharge Plan: [place] Barriers to Discharge: No Barriers Identified  Expected Discharge Plan and Services Expected Discharge Plan: [place] Choice: [place] arrangements for the past 2 months: Mobile Home Expected Discharge Date: 08/22/21                                     Social Determinants of Health (SDOH) Interventions    Readmission Risk Interventions No flowsheet data found.

## 2021-08-24 NOTE — ED Notes (Signed)
Pt assisted to the bathroom via wheel chair

## 2021-08-24 NOTE — ED Provider Notes (Signed)
[facility] Provider Note    Event Date/Time   First MD Initiated Contact with Patient 08/24/21 [phone]     (approximate)   History   Fall   HPI  Lance House is a 71 y.o. male with past medical history of EtOH use disorder, anxiety, depression, who presents from his rehab facility because of hip pain.  Patient was discharged from the hospital today where he was admitted after a fall found to have compression fracture from T12-L4 and a small ICH.  His hospitalization was complicated by hyponatremia due to beer Potomania, hypokalemia and rhabdomyolysis.  He was discharged to [facility] today.  Patient tells me that he was working at the facility with the physical therapist and they told him there was nothing else they could do for him because he was in so much pain.  Apparently patient left AGAINST MEDICAL ADVICE from his rehab.  His wife is not willing to take him back home.   Past Medical History:  Diagnosis Date   Anxiety    Depression    Gout     Patient Active Problem List   Diagnosis Date Noted   Compression fracture of fourth lumbar vertebra (HCC)    Elevated troponin    Pain    Alcoholic intoxication without complication (HCC)    Fall    Compression fracture of thoracic vertebra (HCC) 08/18/2021   Compression fracture of L4 lumbar vertebra, closed, initial encounter ([place]) 08/18/2021   Hyponatremia 08/16/2021   Hypokalemia 08/16/2021   Rhabdomyolysis 08/16/2021   ICH (intracerebral hemorrhage) ([place]) 08/15/2021   S/P inguinal hernia repair 04/13/2019   Incarcerated left inguinal hernia    Abdominal pain 04/08/2019   Hernia 08/13/2013   Tobacco use disorder 08/13/2013   Chronic sinusitis 02/26/2013   Dyspnea 02/26/2013   Anxiety 02/03/2013   Left flank pain 02/03/2013     Physical Exam  Triage Vital Signs: ED Triage Vitals  Enc Vitals Group     BP 08/24/21 1750 (!) 156/102     Pulse Rate 08/24/21 1750 96     Resp 08/24/21 1750 18      Temp 08/24/21 1750 97.7 F (36.5 C)     Temp Source 08/24/21 1750 Oral     SpO2 08/24/21 1750 94 %     Weight 08/24/21 1751 180 lb (81.6 kg)     Height 08/24/21 1751 6\' 1"  (1.854 m)     Head Circumference --      Peak Flow --      Pain Score 08/24/21 1750 10     Pain Loc --      Pain Edu? --      Excl. in [place]? --     Most recent vital signs: Vitals:   08/24/21 1750  BP: (!) 156/102  Pulse: 96  Resp: 18  Temp: 97.7 F (36.5 C)  SpO2: 94%     General: Awake, no distress.  Appears chronically ill CV:  Good peripheral perfusion.  Resp:  Normal effort.  Abd:  No distention.  Neuro:             Awake, Alert, Oriented x 3  Other:     ED Results / Procedures / Treatments  Labs (all labs ordered are listed, but only abnormal results are displayed) Labs Reviewed  RESP PANEL BY RT-PCR (FLU A&B, COVID) [id number]     EKG     RADIOLOGY Reviewed the x-ray of the left hip which shows no fracture dislocation  PROCEDURES:  MEDICATIONS ORDERED IN ED: Medications  acetaminophen (TYLENOL) tablet 650 mg (has no administration in time range)  albuterol (VENTOLIN HFA) 108 (90 Base) MCG/ACT inhaler 2 puff (has no administration in time range)  allopurinol (ZYLOPRIM) tablet 300 mg (has no administration in time range)  clonazePAM (KLONOPIN) tablet 0.5 mg (0.5 mg Oral Given 08/24/21 2201)  oxyCODONE (Oxy IR/ROXICODONE) immediate release tablet 10 mg (10 mg Oral Given 08/24/21 2201)  sodium chloride tablet 1 g (has no administration in time range)  Vitamin D (Ergocalciferol) (DRISDOL) capsule 50,000 Units (has no administration in time range)     IMPRESSION / MDM / ASSESSMENT AND PLAN / ED COURSE  I reviewed the triage vital signs and the nursing notes.                             Patient is a 71 year old male who was recently hospitalized after a fall found to have compression fractures and a small ICH that was managed conservatively.  He was discharged today and left AGAINST  MEDICAL ADVICE from his rehab facility.  Tells me he was in too much pain and that they actually told him there was nothing they could do for him although I do not know if this is for sure the truth.  Patient is able to ambulate with a crutch.  Unfortunately he does not have a home to go to because his wife cannot take him back.  Apparently patient had another fall today in the left hip so I obtained an x-ray of the hip which does not show any fracture dislocation.  We will consult social work and have the patient board in the ED.  Home meds ordered.  Has been in the hospital so my suspicion and concern for alcohol withdrawal is low.   FINAL CLINICAL IMPRESSION(S) / ED DIAGNOSES   Final diagnoses:  Back pain, unspecified back location, unspecified back pain laterality, unspecified chronicity     Rx / DC Orders   ED Discharge Orders     None        Note:  This document was prepared using Dragon voice recognition software and may include unintentional dictation errors.   Rada Hay, MD 08/24/21 2308

## 2021-08-24 NOTE — Progress Notes (Addendum)
Pt continues to experience significant pain to lower back (consistent with area of fracture).   Pt additionally continues to refused back brace provided and has been educated on the need for such.   At this time pt is being monitored while awaiting further discharge instructions.

## 2021-08-25 NOTE — TOC Initial Note (Signed)
Transition of Care [facility]) - Initial/Assessment Note    Patient Details  Name: Lance House MRN: [id number] Date of Birth: [birth year]  Transition of Care [facility]) CM/SW Contact:    Shelbie Hutching, RN Phone Number: 08/25/2021, 12:23 PM  Clinical Narrative:                 Patient discharged from the hospital yesterday to [facility] for short term rehab.  Patient left AMA when he got there and the facility sent him back to the emergency department.  Patient's wife was contacted by [facility] and she refused to take him home saying she could not care for him.   RNCM met with patient at the bedside, no need for admission to hospital just needs SNF placement.  Patient is confused and did not even realize he was at the hospital.  He agrees to SNF [facility] will also speak with wife as patient is not making a lot of sense.   TOC will work on placement.  PT consult pending.  PT needed for placement.  Expected Discharge Plan: Skilled Nursing Facility Barriers to Discharge: SNF Pending bed offer, ED SNF auth   Patient Goals and CMS Choice Patient states their goals for this hospitalization and ongoing recovery are:: Patient agrees to go to another facility- he is confused CMS Medicare.gov Compare Post Acute Care list provided to:: Patient Represenative (must comment) Choice offered to / list presented to : Spouse, Patient  Expected Discharge Plan and Services Expected Discharge Plan: [place]   Discharge Planning Services: CM Consult Post Acute Care Choice: [place] Living arrangements for the past 2 months: Mobile Home                 DME Arranged: N/A DME Agency: NA       HH Arranged: NA          Prior Living Arrangements/Services Living arrangements for the past 2 months: Mobile Home Lives with:: Spouse Patient language and need for interpreter reviewed:: Yes Do you feel safe going back to the place where you live?: Yes      Need for Family  Participation in Patient Care: Yes (Comment) (compression fractures) Care giver support system in place?: Yes (comment) (wife) Current home services: DME (cane) Criminal Activity/Legal Involvement Pertinent to Current Situation/Hospitalization: No - Comment as needed  Activities of Daily Living      Permission Sought/Granted Permission sought to share information with : Case Manager, Customer service manager, Family Supports Permission granted to share information with : Yes, Verbal Permission Granted  Share Information with NAME: Lance House  Permission granted to share info w AGENCY: SNF's  Permission granted to share info w Relationship: spouse  Permission granted to share info w Contact Information: [phone]  Emotional Assessment Appearance:: Appears stated age, Disheveled Attitude/Demeanor/Rapport: Engaged Affect (typically observed): Accepting Orientation: : Oriented to Self, Oriented to Situation Alcohol / Substance Use: Alcohol Use Psych Involvement: No (comment)  Admission diagnosis:  Hip pain Patient Active Problem List   Diagnosis Date Noted   Compression fracture of fourth lumbar vertebra (HCC)    Elevated troponin    Pain    Alcoholic intoxication without complication ([place])    Fall    Compression fracture of thoracic vertebra ([place]) 08/18/2021   Compression fracture of L4 lumbar vertebra, closed, initial encounter ([place]) 08/18/2021   Hyponatremia 08/16/2021   Hypokalemia 08/16/2021   Rhabdomyolysis 08/16/2021   ICH (intracerebral hemorrhage) ([place]) 08/15/2021   S/P inguinal  hernia repair 04/13/2019   Incarcerated left inguinal hernia    Abdominal pain 04/08/2019   Hernia 08/13/2013   Tobacco use disorder 08/13/2013   Chronic sinusitis 02/26/2013   Dyspnea 02/26/2013   Anxiety 02/03/2013   Left flank pain 02/03/2013   PCP:  Associates, [place]:   [facility] [address] [place] [address] [place] [address] Phone: [phone] Fax: [phone]  [id number] [place] [address] [place]: [phone]: 3[place]Phone: [phone]: [phone]    Social Determinants of Health (SDOH) Interventions    Readmission Risk Interventions No flowsheet data found.

## 2021-08-25 NOTE — ED Notes (Signed)
RN rounded on pt. Pt is currently resting pain medication. Respirations are even and unlabored. Pt is NAD.

## 2021-08-25 NOTE — Progress Notes (Signed)
PT Cancellation Note  Patient Details Name: Lance House MRN: [id number] DOB: 07-05-1951   Cancelled Treatment:    Reason Eval/Treat Not Completed: Other (comment) (Chart reviewed, evaluation attempted. Pt asleep upon arrival, interactivbe once awake, but incoherent, responses often out of context to author's questioning or cues. Pt tired, frustrated, uninterested in mobility at this time, disoriented to situation.) Will attempt evaluation again at later date/time.   11:21 AM, 08/25/21 Etta Grandchild, PT, DPT Physical Therapist - [facility]  [phone] ([place])    [place] C 08/25/2021, 11:21 AM

## 2021-08-25 NOTE — Progress Notes (Signed)
Returned wife Cynthia's call at this time. Mrs. Whittlesey states she is still unwilling to take patient home and believes he needs to be relocated to a psychiatric facility. She is hoping to obtain medical POA for him. Her phone number is in the chart and she is available by phone anytime.

## 2021-08-25 NOTE — ED Notes (Addendum)
Pt transitioned to a hospital bed to promote comfort - bed alarm set.

## 2021-08-25 NOTE — ED Notes (Signed)
Pt assisted to the BSC.

## 2021-08-25 NOTE — Progress Notes (Signed)
Patient refusing TLSO brace when ambulating to [facility].

## 2021-08-25 NOTE — NC FL2 (Signed)
[place] LEVEL OF CARE SCREENING TOOL     IDENTIFICATION  Patient Name: Lance House Birthdate: 1950-09-29 Sex: male Admission Date (Current Location): 08/24/2021  [facility] and [place] Number:  Engineering geologist and Address:  [facility], [address], [place], [place] [address]      Provider Number: [id number]  Attending Physician Name and Address:  No att. providers found  Relative Name and Phone Number:  Manford Sprong [phone]    Current Level of Care: Hospital Recommended Level of Care: [place] Prior Approval Number:    Date Approved/Denied:   PASRR Number: [id number]  exp: 09/20/21  Discharge Plan: SNF    Current Diagnoses: Patient Active Problem List   Diagnosis Date Noted   Compression fracture of fourth lumbar vertebra (HCC)    Elevated troponin    Pain    Alcoholic intoxication without complication ([place])    Fall    Compression fracture of thoracic vertebra ([place]) 08/18/2021   Compression fracture of L4 lumbar vertebra, closed, initial encounter ([place]) 08/18/2021   Hyponatremia 08/16/2021   Hypokalemia 08/16/2021   Rhabdomyolysis 08/16/2021   ICH (intracerebral hemorrhage) ([place]) 08/15/2021   S/P inguinal hernia repair 04/13/2019   Incarcerated left inguinal hernia    Abdominal pain 04/08/2019   Hernia 08/13/2013   Tobacco use disorder 08/13/2013   Chronic sinusitis 02/26/2013   Dyspnea 02/26/2013   Anxiety 02/03/2013   Left flank pain 02/03/2013    Orientation RESPIRATION BLADDER Height & Weight     Self  Normal Continent Weight: 81.6 kg Height:  6\' 1"  (185.4 cm)  BEHAVIORAL SYMPTOMS/MOOD NEUROLOGICAL BOWEL NUTRITION STATUS      Continent Diet (Regular)  AMBULATORY STATUS COMMUNICATION OF NEEDS Skin   Limited Assist Verbally Normal                       Personal Care Assistance Level of Assistance  Bathing, Feeding, Dressing Bathing Assistance: Limited assistance Feeding  assistance: Independent Dressing Assistance: Limited assistance     Functional Limitations Info  Sight, Hearing, Speech Sight Info: Impaired Hearing Info: Adequate Speech Info: Adequate    SPECIAL CARE FACTORS FREQUENCY  PT (By licensed PT), OT (By licensed OT)     PT Frequency: 5 times per week OT Frequency: 5 times per week            Contractures Contractures Info: Not present    Additional Factors Info  Code Status, Allergies Code Status Info: Full Allergies Info: Ciprocin, ciprofloxacin, colchicine, duloxetine           Current Medications (08/25/2021):  This is the current hospital active medication list Current Facility-Administered Medications  Medication Dose Route Frequency Provider Last Rate Last Admin   acetaminophen (TYLENOL) tablet 650 mg  650 mg Oral Q6H PRN Rada Hay, MD       albuterol (PROVENTIL) (2.5 MG/3ML) 0.083% nebulizer solution 3 mL  3 mL Inhalation Q4H PRN Rada Hay, MD       allopurinol (ZYLOPRIM) tablet 300 mg  300 mg Oral Daily Rada Hay, MD   300 mg at 08/25/21 0201   clonazePAM (KLONOPIN) tablet 0.5 mg  0.5 mg Oral BID PRN Rada Hay, MD   0.5 mg at 08/24/21 2201   oxyCODONE (Oxy IR/ROXICODONE) immediate release tablet 10 mg  10 mg Oral Q4H PRN Rada Hay, MD   10 mg at 08/25/21 1054   sodium chloride tablet 1 g  1  g Oral BID WC Rada Hay, MD       Vitamin D (Ergocalciferol) (DRISDOL) capsule 50,000 Units  50,000 Units Oral Weekly Rada Hay, MD   50,000 Units at 08/25/21 0201   Current Outpatient Medications  Medication Sig Dispense Refill   albuterol (VENTOLIN HFA) 108 (90 Base) MCG/ACT inhaler Inhale 1-2 puffs into the lungs every 4 (four) hours as needed for shortness of breath.     allopurinol (ZYLOPRIM) 300 MG tablet Take 300 mg by mouth daily.     clonazePAM (KLONOPIN) 1 MG tablet Take 0.5 tablets (0.5 mg total) by mouth 2 (two) times daily as needed. 10 tablet 0   Oxycodone  HCl 10 MG TABS Take 1 tablet (10 mg total) by mouth every 4 (four) hours as needed. 15 tablet 0   sodium chloride 1 g tablet Take 1 tablet (1 g total) by mouth 2 (two) times daily with a meal for 7 days. 14 tablet 0   acetaminophen (TYLENOL) 325 MG tablet Take 2 tablets (650 mg total) by mouth every 6 (six) hours as needed for mild pain (or Fever >/= 101).     Vitamin D, Ergocalciferol, (DRISDOL) 1.25 MG (50000 UNIT) CAPS capsule Take 1 capsule by mouth once a week.       Discharge Medications: Please see discharge summary for a list of discharge medications.  Relevant Imaging Results:  Relevant Lab Results:   Additional Information SS: [id number]  Shelbie Hutching, RN

## 2021-08-26 LAB — BASIC METABOLIC PANEL
Anion gap: 8 (ref 5–15)
BUN: <5 mg/dL — ABNORMAL LOW (ref 8–23)
CO2: 21 mmol/L — ABNORMAL LOW (ref 22–32)
Calcium: 8.8 mg/dL — ABNORMAL LOW (ref 8.9–10.3)
Chloride: 99 mmol/L (ref 98–111)
Creatinine, Ser: 0.44 mg/dL — ABNORMAL LOW (ref 0.61–1.24)
GFR, Estimated: >60 mL/min (ref >60)
Glucose, Bld: 123 mg/dL — ABNORMAL HIGH (ref 70–99)
Potassium: 3.6 mmol/L (ref 3.5–5.1)
Sodium: 128 mmol/L — ABNORMAL LOW (ref 135–145)

## 2021-08-26 LAB — CBC WITH DIFFERENTIAL/PLATELET
Abs Immature Granulocytes: 0.06 10*3/uL (ref 0.00–0.07)
Basophils Absolute: 0.1 10*3/uL (ref 0.0–0.1)
Basophils Relative: 1 %
Eosinophils Absolute: 0.1 10*3/uL (ref 0.0–0.5)
Eosinophils Relative: 1 %
HCT: 43 % (ref 39.0–52.0)
Hemoglobin: 14.6 g/dL (ref 13.0–17.0)
Immature Granulocytes: 1 %
Lymphocytes Relative: 12 %
Lymphs Abs: 1.2 10*3/uL (ref 0.7–4.0)
MCH: 32.3 pg (ref 26.0–34.0)
MCHC: 34 g/dL (ref 30.0–36.0)
MCV: 95.1 fL (ref 80.0–100.0)
Monocytes Absolute: 0.9 10*3/uL (ref 0.1–1.0)
Monocytes Relative: 9 %
Neutro Abs: 7.6 10*3/uL (ref 1.7–7.7)
Neutrophils Relative %: 76 %
Platelets: 510 10*3/uL — ABNORMAL HIGH (ref 150–400)
RBC: 4.52 MIL/uL (ref 4.22–5.81)
RDW: 13.5 % (ref 11.5–15.5)
WBC: 10 10*3/uL (ref 4.0–10.5)
nRBC: 0 % (ref 0.0–0.2)

## 2021-08-26 NOTE — ED Notes (Signed)
Pt alert at this time asking about his upcoming pain medication.  This RN assured him that I would bring it to him as soon as the order timing of 4 hours was up.  Pt states that his pain is always a 10 in his lower back.

## 2021-08-26 NOTE — TOC Progression Note (Addendum)
Transition of Care [facility]) - Progression Note    Patient Details  Name: Lance House MRN: [id number] Date of Birth: 1950-08-22  Transition of Care [facility] CM/SW [place], [place] Phone Number: 08/26/2021, 3:06 PM  Clinical Narrative:    CSW attempted to get patient admitted to geriatric psych but was told he does not meet criteria. CSW pursuing memory care with [place]. CSW spoke to [place] from facility [phone]) and was told they will monitor how he does over the weekend and consider accepting him. CSW spoke to spouse Lance House [phone]) and informed her that he does not meet criteria for geriatric psych and been informed about current process of memory care placement.  TOC will follow up on Monday.   Expected Discharge Plan: Skilled Nursing Facility Barriers to Discharge: SNF Pending bed offer, ED SNF auth  Expected Discharge Plan and Services Expected Discharge Plan: [place]   Discharge Planning Services: CM Consult Post Acute Care Choice: [place] Living arrangements for the past 2 months: Mobile Home                 DME Arranged: N/A DME Agency: NA       HH Arranged: NA           Social Determinants of Health (SDOH) Interventions    Readmission Risk Interventions No flowsheet data found.

## 2021-08-26 NOTE — ED Notes (Signed)
Pt given gingerale and cup of ice per his request, bedside commode emptied at this time.

## 2021-08-26 NOTE — ED Notes (Signed)
Emptied bedside toilet.... urine very dark

## 2021-08-26 NOTE — ED Notes (Signed)
Wife at bedside. Requesting to see Education officer, museum.... social work notified.

## 2021-08-26 NOTE — ED Notes (Signed)
Pt is seeing people who are not here.... says his wife is here with his daughter.Marland KitchenMarland KitchenMarland Kitchen

## 2021-08-26 NOTE — ED Notes (Signed)
Emptied bedside toilet. Urine very dak

## 2021-08-26 NOTE — ED Notes (Signed)
Pt requesting more pain medication. Informed he can have tylenol.... RN brought tylenol... pt refused to take full dose. Only took one pill of 325mg .

## 2021-08-26 NOTE — ED Notes (Signed)
Pt asking for pain medication,. Pt informed he can have his scheduled pain meds at 1030am

## 2021-08-26 NOTE — ED Notes (Signed)
Pt up walking around. Asked for phone to call his wife to ask her to bring him a wheelchair. Pt alert and oriented. Pt asked to remain in his area and we will be with him shortly.

## 2021-08-26 NOTE — ED Notes (Signed)
Rn to bedside to give phone at pt request. Pt states "I need a wheel chair to roll around to visit other floors and patients". This Rn advised he was not allowed to do that he must stay in his designated area.

## 2021-08-26 NOTE — ED Notes (Addendum)
Pt is speaking out of his head... unaware of date and time... unaware of how long he has been here.Lance KitchenMarland KitchenPt is more confused than beginning of shift this morning. MD made aware.

## 2021-08-26 NOTE — Evaluation (Signed)
Physical Therapy Evaluation Patient Details Name: Lance House MRN: [id number] DOB: Jul 06, 1951 Today's Date: 08/26/2021  History of Present Illness  Pt is a 71 y.o. male with past medical history of EtOH use disorder, anxiety, depression, who presented from his rehab facility because of hip pain.  Patient was recently discharged from the hospital where he was admitted after a fall found to have T12, L1, and L4 compression fractures and a small ICH. Pt left rehab facility AMA.   Clinical Impression  Pt was pleasant and motivated to participate during the session and put forth good effort throughout. Pt reported 10/10 pain in his low back but was in no apparent distress during the session and reported no other adverse symptoms with SpO2 and HR WNL.  Pt required extra time and effort with all functional tasks along with cuing for proper sequencing.  Pt demonstrated poor safety awareness with gait with frequent cues needed for general safety with the RW to avoid tripping over the legs of the walker.  Extensive encouragement given to pt to don his TLSO during the session with pt adamantly refusing despite report of 10/10 back pain.  Pt is at a high risk for falls and would not be safe to return to his home living situation at this time.  Pt will benefit from PT services in a SNF setting upon discharge to safely address deficits listed in patient problem list for decreased caregiver assistance and eventual return to PLOF.         Recommendations for follow up therapy are one component of a multi-disciplinary discharge planning process, led by the attending physician.  Recommendations may be updated based on patient status, additional functional criteria and insurance authorization.  Follow Up Recommendations Skilled nursing-short term rehab (<3 hours/day)    Assistance Recommended at Discharge Frequent or constant Supervision/Assistance  Patient can return home with the following  A lot of help with  walking and/or transfers;A lot of help with bathing/dressing/bathroom;Assistance with feeding;Assist for transportation;Assistance with cooking/housework;Direct supervision/assist for medications management;Help with stairs or ramp for entrance    Equipment Recommendations Rolling walker (2 wheels)  Recommendations for Other Services       Functional Status Assessment Patient has had a recent decline in their functional status and demonstrates the ability to make significant improvements in function in a reasonable and predictable amount of time.     Precautions / Restrictions Precautions Precautions: Fall Precaution Comments: TLSO brace in room but pt refusing to don it Restrictions Weight Bearing Restrictions: No      Mobility  Bed Mobility Overal bed mobility: Needs Assistance       Supine to sit: Supervision Sit to supine: Supervision   General bed mobility comments: increased time/effort, use of bed rails    Transfers Overall transfer level: Needs assistance Equipment used: Rolling walker (2 wheels) Transfers: Sit to/from Stand Sit to Stand: Min guard           General transfer comment: Fair eccentric and concentric control    Ambulation/Gait Ambulation/Gait assistance: Min guard Gait Distance (Feet): 30 Feet x 1 with one standing therapeutic rest break Assistive device: Rolling walker (2 wheels) Gait Pattern/deviations: Step-through pattern, Decreased step length - right, Decreased step length - left, Trunk flexed, Drifts right/left, Wide base of support Gait velocity: decreased     General Gait Details: Very slow cadence with wide BOS and frequent cues for amb closer to the RW and within the RW during turns with pt frequently letting back leg of  walker get between his feet during turns, high fall risk  Stairs            Wheelchair Mobility    Modified Rankin (Stroke Patients Only)       Balance Overall balance assessment: Needs assistance,  History of Falls Sitting-balance support: No upper extremity supported, Feet supported Sitting balance-Leahy Scale: Fair     Standing balance support: Bilateral upper extremity supported, During functional activity Standing balance-Leahy Scale: Fair                               Pertinent Vitals/Pain Pain Assessment Pain Assessment: 0-10 Pain Score: 10-Worst pain ever Pain Location: Back, no signs of distress during the session Pain Descriptors / Indicators: Sore Pain Intervention(s): Premedicated before session, Repositioned, Monitored during session    [place] expects to be discharged to:: Private residence Living Arrangements: Spouse/significant other Available Help at Discharge: Family;Available PRN/intermittently Type of Home: House Home Access: Stairs to enter Entrance Stairs-Rails: Right;Left;Can reach both Entrance Stairs-Number of Steps: ~6   Home Layout: One level Home Equipment: Rollator (4 wheels)      Prior Function Prior Level of Function : Independent/Modified Independent                     Hand Dominance        Extremity/Trunk Assessment   Upper Extremity Assessment Upper Extremity Assessment: Generalized weakness    Lower Extremity Assessment Lower Extremity Assessment: Generalized weakness       Communication      Cognition Arousal/Alertness: Awake/alert Behavior During Therapy: WFL for tasks assessed/performed Overall Cognitive Status: No family/caregiver present to determine baseline cognitive functioning                                 General Comments: Tangential, needs redirected, some difficulty answering questions, poor safety awareness including citing 10/10 back pain but refusing to wear TLSO despite education and encourgement        General Comments      Exercises Total Joint Exercises Ankle Circles/Pumps: AROM, Strengthening, Both, 10 reps Long Arc Quad: AROM,  Strengthening, Both, 10 reps Knee Flexion: AROM, Strengthening, 10 reps, Both Other Exercises Other Exercises: Pt instructed on usage of TLSO and educated on protocol for doning w/ mobility and OOB activities, repeatedly declined to don TLSO   Assessment/Plan    PT Assessment Patient needs continued PT services  PT Problem List Decreased strength;Decreased activity tolerance;Decreased balance;Decreased mobility;Pain;Decreased knowledge of use of DME;Decreased safety awareness;Decreased knowledge of precautions       PT Treatment Interventions Gait training;Stair training;Functional mobility training;Therapeutic activities;Therapeutic exercise;Balance training;Patient/family education;DME instruction    PT Goals (Current goals can be found in the Care Plan section)  Acute Rehab PT Goals Patient Stated Goal: To walk better without pain PT Goal Formulation: With patient Time For Goal Achievement: 09/08/21 Potential to Achieve Goals: Fair    Frequency Min 2X/week     Co-evaluation               AM-PAC PT "6 Clicks" Mobility  Outcome Measure Help needed turning from your back to your side while in a flat bed without using bedrails?: A Little Help needed moving from lying on your back to sitting on the side of a flat bed without using bedrails?: A Little Help needed moving to and from a bed to  a chair (including a wheelchair)?: A Little Help needed standing up from a chair using your arms (e.g., wheelchair or bedside chair)?: A Little Help needed to walk in hospital room?: A Lot Help needed climbing 3-5 steps with a railing? : A Lot 6 Click Score: 16    End of Session Equipment Utilized During Treatment: Gait belt Activity Tolerance: Patient limited by pain Patient left: in bed;with call bell/phone within reach Nurse Communication: Mobility status PT Visit Diagnosis: Unsteadiness on feet (R26.81);History of falling (Z91.81);Muscle weakness (generalized)  (M62.81);Pain;Difficulty in walking, not elsewhere classified (R26.2) Pain - Right/Left:  (midline) Pain - part of body:  (low back)    Time: 1916-6060 PT Time Calculation (min) (ACUTE ONLY): 14 min   Charges:   PT Evaluation $PT Eval Moderate Complexity: 1 Mod          D. Scott Jerron Niblack PT, DPT 08/26/21, 3:27 PM

## 2021-08-27 NOTE — ED Notes (Signed)
Pt oob with walker to BR several times independently without issue. Pt confused at times especially after sleeping and is hard to orient but has been pleasant at all.

## 2021-08-27 NOTE — ED Notes (Signed)
Pt alert, pain medication given per his request.  Bedside commode emptied at this time.  Pt stated that he heard "him" laughing (pointing to room 1) at him.  This RN told pt that no one was laughing at him and that no one is in that room.  Pt walked toward door of room 1 and this RN redirected him back to bed. Pt insisting that RN's son is here and that he was here last night.  I explained to pt that my son was not here now or last night and he stated "do not whine to me."

## 2021-08-27 NOTE — ED Notes (Signed)
Pt given a sprite and 2 packs of graham crackers

## 2021-08-27 NOTE — ED Notes (Signed)
Pt given a sprite 

## 2021-08-27 NOTE — ED Provider Notes (Signed)
Patient seated in chair, resting comfortably.  Alert.  He reports no concerns at this time. Earting lunch  TSC team currently pursuing memory care with [facility]  Vitals:   08/26/21 1916 08/27/21 0832  BP:  (!) 157/91  Pulse:  86  Resp:  16  Temp:  98.1 F (36.7 C)  SpO2: 98% 99%       Delman Kitten, MD 08/27/21 1400

## 2021-08-27 NOTE — ED Notes (Signed)
Pt given water without ice at this time per his request.  Pt asking for pain medication.  This RN explained to patient his medication schedule and assured him that he will receive his pain medication as soon as the order allows.

## 2021-08-27 NOTE — ED Notes (Signed)
Pt alert at this time, given graham crackers and gingerale per his request.

## 2021-08-27 NOTE — ED Notes (Signed)
Pt sitting in recliner chair per request and directed to request assistance if in need to ambulate to bathroom or back to bed. Pt provides verbal understanding.

## 2021-08-28 LAB — CBC WITH DIFFERENTIAL/PLATELET
Abs Immature Granulocytes: 0.07 10*3/uL (ref 0.00–0.07)
Basophils Absolute: 0.1 10*3/uL (ref 0.0–0.1)
Basophils Relative: 1 %
Eosinophils Absolute: 0.1 10*3/uL (ref 0.0–0.5)
Eosinophils Relative: 1 %
HCT: 43.7 % (ref 39.0–52.0)
Hemoglobin: 14.6 g/dL (ref 13.0–17.0)
Immature Granulocytes: 1 %
Lymphocytes Relative: 15 %
Lymphs Abs: 1.5 10*3/uL (ref 0.7–4.0)
MCH: 32.3 pg (ref 26.0–34.0)
MCHC: 33.4 g/dL (ref 30.0–36.0)
MCV: 96.7 fL (ref 80.0–100.0)
Monocytes Absolute: 0.9 10*3/uL (ref 0.1–1.0)
Monocytes Relative: 10 %
Neutro Abs: 7.1 10*3/uL (ref 1.7–7.7)
Neutrophils Relative %: 72 %
Platelets: 528 10*3/uL — ABNORMAL HIGH (ref 150–400)
RBC: 4.52 MIL/uL (ref 4.22–5.81)
RDW: 13.6 % (ref 11.5–15.5)
WBC: 9.8 10*3/uL (ref 4.0–10.5)
nRBC: 0 % (ref 0.0–0.2)

## 2021-08-28 LAB — URINALYSIS, COMPLETE (UACMP) WITH MICROSCOPIC
Bacteria, UA: NONE SEEN
Bilirubin Urine: NEGATIVE
Glucose, UA: NEGATIVE mg/dL
Hgb urine dipstick: NEGATIVE
Ketones, ur: NEGATIVE mg/dL
Leukocytes,Ua: NEGATIVE
Nitrite: NEGATIVE
Protein, ur: NEGATIVE mg/dL
Specific Gravity, Urine: 1.01 (ref 1.005–1.030)
pH: 6.5 (ref 5.0–8.0)

## 2021-08-28 NOTE — TOC Progression Note (Signed)
Transition of Care [facility]) - Progression Note    Patient Details  Name: Lance House MRN: [id number] Date of Birth: 1950-08-31  [facility]) CM/SW Contact  Shelbie Hutching, RN Phone Number: 08/28/2021, 11:56 AM  Clinical Narrative:    Patient's wife has chosen [facility] for short term rehab.  RNCM called [place] and they will start insurance authorization.     Expected Discharge Plan: Skilled Nursing Facility Barriers to Discharge: SNF Pending bed offer, ED SNF auth  Expected Discharge Plan and Services Expected Discharge Plan: [place]   Discharge Planning Services: CM Consult Post Acute Care Choice: [place] Living arrangements for the past 2 months: Mobile Home                 DME Arranged: N/A DME Agency: NA       HH Arranged: NA           Social Determinants of Health (SDOH) Interventions    Readmission Risk Interventions No flowsheet data found.

## 2021-08-28 NOTE — ED Notes (Signed)
Patient ambulatory to bathroom with walker

## 2021-08-28 NOTE — ED Notes (Signed)
Pt given sprite and cup of ice per request

## 2021-08-28 NOTE — Progress Notes (Signed)
Physical Therapy Treatment Patient Details Name: Lance House MRN: [id number] DOB: 08-21-1950 Today's Date: 08/28/2021   History of Present Illness Pt is a 71 y.o. male with past medical history of EtOH use disorder, anxiety, depression, who presented from his rehab facility because of hip pain.  Patient was recently discharged from the hospital where he was admitted after a fall found to have T12, L1, and L4 compression fractures and a small ICH. Pt left rehab facility AMA.    PT Comments    Pt was pleasant and motivated to participate during the session and put forth good effort throughout. Pt reported 10/10 pain in his low to mid back but no significant signs of distress and pain did not limit participation.  Pt was slow to perform transfers but demonstrated good control and stability.  Pt ambulated 40 feet with cues for sequencing for general safety with SpO2 and HR WNL. Pt will benefit from PT services in a SNF setting upon discharge to safely address deficits listed in patient problem list for decreased caregiver assistance and eventual return to PLOF.     Recommendations for follow up therapy are one component of a multi-disciplinary discharge planning process, led by the attending physician.  Recommendations may be updated based on patient status, additional functional criteria and insurance authorization.  Follow Up Recommendations  Skilled nursing-short term rehab (<3 hours/day)     Assistance Recommended at Discharge Frequent or constant Supervision/Assistance  Patient can return home with the following A lot of help with walking and/or transfers;A lot of help with bathing/dressing/bathroom;Assistance with feeding;Assist for transportation;Assistance with cooking/housework;Direct supervision/assist for medications management;Help with stairs or ramp for entrance   Equipment Recommendations  Rolling walker (2 wheels)    Recommendations for Other Services       Precautions /  Restrictions Precautions Precautions: Fall Precaution Comments: TLSO brace in room but pt refusing to don it Restrictions Weight Bearing Restrictions: No     Mobility  Bed Mobility               General bed mobility comments: NT, pt in recliner    Transfers Overall transfer level: Needs assistance Equipment used: Rolling walker (2 wheels) Transfers: Sit to/from Stand Sit to Stand: Min guard           General transfer comment: Fair eccentric and concentric control    Ambulation/Gait Ambulation/Gait assistance: Min guard Gait Distance (Feet): 40 Feet Assistive device: Rolling walker (2 wheels) Gait Pattern/deviations: Step-through pattern, Decreased step length - right, Decreased step length - left, Trunk flexed, Drifts right/left, Wide base of support Gait velocity: decreased     General Gait Details: Very slow cadence with wide BOS and frequent cues for amb closer to the RW and within the RW during turns with pt frequently letting back leg of walker get between his feet during turns, high fall risk; again encouraged pt to don TLSO to help address his reported back pain with pt refusing   Stairs             Wheelchair Mobility    Modified Rankin (Stroke Patients Only)       Balance Overall balance assessment: Needs assistance, History of Falls Sitting-balance support: No upper extremity supported, Feet supported Sitting balance-Leahy Scale: Fair     Standing balance support: Bilateral upper extremity supported, During functional activity Standing balance-Leahy Scale: Fair  Cognition Arousal/Alertness: Awake/alert Behavior During Therapy: WFL for tasks assessed/performed Overall Cognitive Status: No family/caregiver present to determine baseline cognitive functioning                                          Exercises Total Joint Exercises Ankle Circles/Pumps: AROM, Strengthening, Both,  10 reps (manual resistance) Quad Sets: Strengthening, Both, 10 reps Gluteal Sets: Strengthening, Both, 10 reps Long Arc Quad: AROM, Strengthening, Both, 10 reps (with manual resistance) Knee Flexion: AROM, Strengthening, 10 reps, Both (with manual resistance) Other Exercises Other Exercises: Pt instructed on usage/benefits of TLSO but repeatedly declined    General Comments        Pertinent Vitals/Pain Pain Assessment Pain Assessment: 0-10 Pain Score: 10-Worst pain ever Pain Location: Back, no signs of distress during the session Pain Descriptors / Indicators: Aching Pain Intervention(s): Repositioned, Monitored during session, Premedicated before session    Home Living                          Prior Function            PT Goals (current goals can now be found in the care plan section) Progress towards PT goals: Progressing toward goals    Frequency    Min 2X/week      PT Plan Current plan remains appropriate    Co-evaluation              AM-PAC PT "6 Clicks" Mobility   Outcome Measure  Help needed turning from your back to your side while in a flat bed without using bedrails?: A Little Help needed moving from lying on your back to sitting on the side of a flat bed without using bedrails?: A Little Help needed moving to and from a bed to a chair (including a wheelchair)?: A Little Help needed standing up from a chair using your arms (e.g., wheelchair or bedside chair)?: A Little Help needed to walk in hospital room?: A Little Help needed climbing 3-5 steps with a railing? : A Little 6 Click Score: 18    End of Session Equipment Utilized During Treatment: Gait belt Activity Tolerance: Patient tolerated treatment well Patient left: in chair;with call bell/phone within reach Nurse Communication: Mobility status PT Visit Diagnosis: Unsteadiness on feet (R26.81);History of falling (Z91.81);Muscle weakness (generalized) (M62.81);Pain;Difficulty in  walking, not elsewhere classified (R26.2) Pain - Right/Left:  (midline) Pain - part of body:  (low back)     Time: 0350-0938 PT Time Calculation (min) (ACUTE ONLY): 12 min  Charges:  $Therapeutic Exercise: 8-22 mins                    D. Scott Mariana Wiederholt PT, DPT 08/28/21, 3:22 PM

## 2021-08-28 NOTE — ED Notes (Signed)
Pt given snack. 

## 2021-08-28 NOTE — TOC Progression Note (Signed)
Transition of Care [facility]) - Progression Note    Patient Details  Name: Conrad Zajkowski MRN: [id number] Date of Birth: 1950/08/07  [facility]) CM/SW Contact  Shelbie Hutching, RN Phone Number: 08/28/2021, 9:35 AM  Clinical Narrative:    Bed offers presented to patient's wife for [facility] and [place].  Wife to review offers and make a decision.     Expected Discharge Plan: Skilled Nursing Facility Barriers to Discharge: SNF Pending bed offer, ED SNF auth  Expected Discharge Plan and Services Expected Discharge Plan: [place]   Discharge Planning Services: CM Consult Post Acute Care Choice: [place] Living arrangements for the past 2 months: Mobile Home                 DME Arranged: N/A DME Agency: NA       HH Arranged: NA           Social Determinants of Health (SDOH) Interventions    Readmission Risk Interventions No flowsheet data found.

## 2021-08-29 LAB — BASIC METABOLIC PANEL
Anion gap: 6 (ref 5–15)
BUN: <5 mg/dL — ABNORMAL LOW (ref 8–23)
CO2: 28 mmol/L (ref 22–32)
Calcium: 8.7 mg/dL — ABNORMAL LOW (ref 8.9–10.3)
Chloride: 96 mmol/L — ABNORMAL LOW (ref 98–111)
Creatinine, Ser: 0.51 mg/dL — ABNORMAL LOW (ref 0.61–1.24)
GFR, Estimated: >60 mL/min (ref >60)
Glucose, Bld: 96 mg/dL (ref 70–99)
Potassium: 4 mmol/L (ref 3.5–5.1)
Sodium: 130 mmol/L — ABNORMAL LOW (ref 135–145)

## 2021-08-29 LAB — CK: Total CK: 48 U/L — ABNORMAL LOW (ref 49–397)

## 2021-08-29 NOTE — Progress Notes (Signed)
Physical Therapy Treatment Patient Details Name: Lance House MRN: [id number] DOB: February 23, 1951 Today's Date: 08/29/2021   History of Present Illness Pt is a 71 y.o. male with past medical history of EtOH use disorder, anxiety, depression, who presented from his rehab facility because of hip pain.  Patient was recently discharged from the hospital where he was admitted after a fall found to have T12, L1, and L4 compression fractures and a small ICH. Pt left rehab facility AMA.    PT Comments    Pt received upright in recliner endorsing 10/10 pain in back. With encouragement, pt willing to don TLSO with mobility. Pt required max assist for correct donning. Educated on benefits of TLSO during donning.  Supervision to stand and ambulate with RW ~100'. Does require minguard for turning RW with max VC's for sequencing LE's and maintaining within BOS as pt continues to turn with LE outside of RW leg leading to high risk of falls. Good carry over after cuing.pt educated on correct height of RW for upright positioning/posture which may help reduce compression on compression fractures by improving spine extension.  Pt repositioned in recliner with pillow placed under buttocks to offload due to reports of having pressure sore. Pt further educated on postural exercise and brief review of LE therex to maintain upright posture and LE strength for functional mobility and reduced risk of falls. D/c recs remain appropriate at this time due to poor safety awareness and unsafe use of AD placing pt at high risk of falls.   Recommendations for follow up therapy are one component of a multi-disciplinary discharge planning process, led by the attending physician.  Recommendations may be updated based on patient status, additional functional criteria and insurance authorization.  Follow Up Recommendations  Skilled nursing-short term rehab (<3 hours/day)     Assistance Recommended at Discharge Frequent or constant  Supervision/Assistance  Patient can return home with the following A lot of help with walking and/or transfers;A lot of help with bathing/dressing/bathroom;Assistance with feeding;Assist for transportation;Assistance with cooking/housework;Direct supervision/assist for medications management;Help with stairs or ramp for entrance   Equipment Recommendations  Rolling walker (2 wheels)    Recommendations for Other Services       Precautions / Restrictions Precautions Precautions: Fall Precaution Comments: TLSO, pt agreeable to don with encouragement Restrictions Weight Bearing Restrictions: No     Mobility  Bed Mobility               General bed mobility comments: NT, pt in recliner Patient Response: Cooperative  Transfers Overall transfer level: Needs assistance   Transfers: Sit to/from Stand Sit to Stand: Supervision                Ambulation/Gait Ambulation/Gait assistance: Supervision, Min guard Gait Distance (Feet): 100 Feet Assistive device: Rolling walker (2 wheels) Gait Pattern/deviations: Step-through pattern, Decreased step length - right, Decreased step length - left, Trunk flexed, Drifts right/left, Wide base of support       General Gait Details: Keeping RW close to BOS. Still requiring VC's for sequencing RW with turns and keeping feet within BOS of RW.Educated on elevating RW to encourage upright posture for improved balance and possible decrease pain for compression fx's   Stairs             Wheelchair Mobility    Modified Rankin (Stroke Patients Only)       Balance Overall balance assessment: Needs assistance, History of Falls Sitting-balance support: No upper extremity supported, Feet supported Sitting balance-Leahy Scale: Fair  Standing balance support: Bilateral upper extremity supported, During functional activity Standing balance-Leahy Scale: Fair                              Cognition Arousal/Alertness:  Awake/alert Behavior During Therapy: WFL for tasks assessed/performed Overall Cognitive Status: No family/caregiver present to determine baseline cognitive functioning                                          Exercises General Exercises - Lower Extremity Ankle Circles/Pumps: AROM, Seated, Both, 10 reps Other Exercises Other Exercises: Brief over view of LE therex. Pt has good udnerstanding. Educated on benefits of TLSO bracing and upright posture. Educated on sitting on pillow due to ulcer on buttocks. x20 scap retractions for upright posture.    General Comments        Pertinent Vitals/Pain Pain Assessment Pain Assessment: 0-10 Pain Score: 10-Worst pain ever Pain Location: Back, no signs of distress during the session Pain Descriptors / Indicators: Aching Pain Intervention(s): Repositioned, Monitored during session    Home Living                          Prior Function            PT Goals (current goals can now be found in the care plan section) Acute Rehab PT Goals Patient Stated Goal: To walk better without pain PT Goal Formulation: With patient Time For Goal Achievement: 09/08/21 Potential to Achieve Goals: Fair Progress towards PT goals: Progressing toward goals    Frequency    Min 2X/week      PT Plan Current plan remains appropriate    Co-evaluation              AM-PAC PT "6 Clicks" Mobility   Outcome Measure  Help needed turning from your back to your side while in a flat bed without using bedrails?: A Little Help needed moving from lying on your back to sitting on the side of a flat bed without using bedrails?: A Little Help needed moving to and from a bed to a chair (including a wheelchair)?: A Little Help needed standing up from a chair using your arms (e.g., wheelchair or bedside chair)?: A Little Help needed to walk in hospital room?: A Little Help needed climbing 3-5 steps with a railing? : A Little 6 Click  Score: 18    End of Session Equipment Utilized During Treatment: Gait belt Activity Tolerance: Patient tolerated treatment well Patient left: in chair;with call bell/phone within reach Nurse Communication: Mobility status PT Visit Diagnosis: Unsteadiness on feet (R26.81);History of falling (Z91.81);Muscle weakness (generalized) (M62.81);Pain;Difficulty in walking, not elsewhere classified (R26.2)     Time: 5056-9794 PT Time Calculation (min) (ACUTE ONLY): 13 min  Charges:  $Therapeutic Activity: 8-22 mins                    Salem Caster. Fairly IV, PT, DPT Physical Therapist- [facility]  08/29/2021, 2:54 PM

## 2021-08-29 NOTE — ED Notes (Signed)
Message sent to pharmacy to send pt's sodium chloride tab.

## 2021-08-29 NOTE — ED Provider Notes (Signed)
----------------------------------------- °  5:01 AM on 08/29/2021 -----------------------------------------   Blood pressure (!) 142/94, pulse 88, temperature 98.5 F (36.9 C), temperature source Oral, resp. rate 16, height 6\' 1"  (1.854 m), weight 81.6 kg, SpO2 97 %.  The patient is calm and cooperative at this time.  There have been no acute events since the last update.  Repeat BMP unremarkable.  Awaiting disposition plan from social work team.   Paulette Blanch, MD 08/29/21 [phone]

## 2021-08-29 NOTE — ED Notes (Signed)
Patient walking with walker and using bedside commode.

## 2021-08-29 NOTE — ED Notes (Signed)
Patient AOX4. Resp even, unlabored on RA. Ambulatory with walker to bathroom and back to chair. Denies needs at this time.

## 2021-08-29 NOTE — ED Notes (Signed)
Patient sleeping at this time. NAD noted. Respirations even and unlabored. 

## 2021-08-29 NOTE — ED Notes (Signed)
Patient ambulatory with walker to bathroom.

## 2021-08-29 NOTE — ED Notes (Signed)
Patient given cup of ice, sprite and graham crackers as requested.

## 2021-08-29 NOTE — ED Notes (Signed)
PT at bedside working with patient.

## 2021-08-29 NOTE — TOC Progression Note (Signed)
Transition of Care [facility]) - Progression Note    Patient Details  Name: Lance House MRN: [id number] Date of Birth: 10-20-1950  [facility]) CM/SW Contact  Shelbie Hutching, RN Phone Number: 08/29/2021, 11:17 AM  Clinical Narrative:    Called and checked on [employer], per facility it is still pending.     Expected Discharge Plan: Skilled Nursing Facility Barriers to Discharge: SNF Pending bed offer, ED SNF auth  Expected Discharge Plan and Services Expected Discharge Plan: [place]   Discharge Planning Services: CM Consult Post Acute Care Choice: [place] Living arrangements for the past 2 months: Mobile Home                 DME Arranged: N/A DME Agency: NA       HH Arranged: NA           Social Determinants of Health (SDOH) Interventions    Readmission Risk Interventions No flowsheet data found.

## 2021-08-29 NOTE — ED Notes (Signed)
Ice provided to patient per request. Denies additional needs.

## 2021-08-30 NOTE — TOC Progression Note (Signed)
Transition of Care [facility]) - Progression Note    Patient Details  Name: Lance House MRN: [id number] Date of Birth: 12-02-1950  Transition of Care [facility]) CM/SW Contact  Shelbie Hutching, RN Phone Number: 08/30/2021, 2:14 PM  Clinical Narrative:    [place] insurance authorization still pending.   Expected Discharge Plan: Skilled Nursing Facility Barriers to Discharge: SNF Pending bed offer, ED SNF auth  Expected Discharge Plan and Services Expected Discharge Plan: [place]   Discharge Planning Services: CM Consult Post Acute Care Choice: [place] Living arrangements for the past 2 months: Mobile Home                 DME Arranged: N/A DME Agency: NA       HH Arranged: NA           Social Determinants of Health (SDOH) Interventions    Readmission Risk Interventions No flowsheet data found.

## 2021-08-30 NOTE — ED Provider Notes (Signed)
Today's Vitals   08/29/21 1556 08/29/21 1707 08/29/21 2146 08/30/21 0308  BP:  125/84 (!) 149/107   Pulse:  99 94   Resp:  18 16   Temp:      TempSrc:      SpO2:  98% 98%   Weight:      Height:      PainSc: 4    Asleep   Body mass index is 23.75 kg/m.  Patient has been up most of the night walking back and forth to the bathroom.  He denies any acute complaints.  Awaiting social work disposition.   Lance House, Delice Bison, DO 08/30/21 [phone]

## 2021-08-30 NOTE — ED Notes (Signed)
Patient resting in bed with eyes closed. Resp even, unlabored on RA. Appears sleeping. No distress noted at this time.

## 2021-08-30 NOTE — ED Notes (Signed)
Patient sitting up in recliner. Patient given sprite and graham crackers as requested. No needs expressed at this time.

## 2021-08-30 NOTE — Progress Notes (Signed)
PT Cancellation Note  Patient Details Name: Zylan Almquist MRN: [id number] DOB: 04-23-1951   Cancelled Treatment:    Reason Eval/Treat Not Completed: Patient declined to participate with PT services this date secondary to back pain. Encouragement given to attempt graded participate to pt's tolerance with pt again declining.  Will attempt to see pt at a future date/time as medically appropriate.    Linus Salmons PT, DPT 08/30/21, 3:06 PM

## 2021-08-30 NOTE — ED Notes (Signed)
Patient sitting upright in recliner at this time. Patient has consistently rated back pain as 10/10 throughout the night despite Q4H oxycodone IR administration. Resp even, unlabored on RA. No distress noted at this time.

## 2021-08-30 NOTE — ED Notes (Signed)
Lunch tray at bedside. Patient given sprite and graham crackers as requested.

## 2021-08-31 NOTE — ED Provider Notes (Signed)
----------------------------------------- °  6:21 AM on 08/31/2021 -----------------------------------------   Blood pressure (!) 146/99, pulse 96, temperature 97.9 F (36.6 C), temperature source Oral, resp. rate 16, height 6\' 1"  (1.854 m), weight 81.6 kg, SpO2 96 %.  The patient is calm and cooperative at this time.  There have been no acute events since the last update.  Awaiting disposition plan from Social Work team.   Paulette Blanch, MD 08/31/21 [phone]

## 2021-08-31 NOTE — TOC Progression Note (Signed)
Transition of Care [facility]) - Progression Note    Patient Details  Name: Lance House MRN: [id number] Date of Birth: Jun 20, 1951  [facility]) CM/SW Contact  Shelbie Hutching, RN Phone Number: 08/31/2021, 3:25 PM  Clinical Narrative:    Insurance auth pending still for SNF.  Called and left a message for patient's wife on update of plan of care.    Expected Discharge Plan: Skilled Nursing Facility Barriers to Discharge: SNF Pending bed offer, ED SNF auth  Expected Discharge Plan and Services Expected Discharge Plan: [place]   Discharge Planning Services: CM Consult Post Acute Care Choice: [place] Living arrangements for the past 2 months: Mobile Home                 DME Arranged: N/A DME Agency: NA       HH Arranged: NA           Social Determinants of Health (SDOH) Interventions    Readmission Risk Interventions No flowsheet data found.

## 2021-08-31 NOTE — ED Notes (Signed)
Pts wife @ the bedside.

## 2021-08-31 NOTE — ED Notes (Signed)
Pt requesting and provided with two sodas, 2 cups of ice and 3 packs of graham crackers.

## 2021-08-31 NOTE — ED Notes (Signed)
Pt requesting and provided with 2 sodas, 2 cups of ice, and 3 packs of graham crackers.

## 2021-08-31 NOTE — ED Notes (Signed)
Pt asking this nurse for his pain medication. This nurse made him aware that it wasn't time yet. Pt states cant you just give it to me, I have to get it in my system. This nurse let him know that I would bring it when he could have it again at 3:40. I also made himn aware that he had already received it twice since this nurse had gotten here at [place].

## 2021-08-31 NOTE — ED Notes (Signed)
Pt provided with 3 packs graham crackers and 2 sodas.

## 2021-08-31 NOTE — ED Notes (Signed)
Pt provided 3 packs graham crackers and 2 cans sprite per request, denies other needs at this time, resting comfortably in chair.

## 2021-08-31 NOTE — ED Notes (Signed)
Pt requested and was provided with 2 sodas, 2 cups of ice, and 3 packs of graham crackers.

## 2021-08-31 NOTE — ED Notes (Signed)
Pt ambulatory to bathroom, steady gait with walker, provided 2 cups soda per request.

## 2021-08-31 NOTE — ED Notes (Signed)
Pt provided cups of ice, sprite and crackers as requested.

## 2021-09-01 NOTE — ED Notes (Signed)
Pt requesting next dose of pain medication. RN informed patient he is not yet due for pain medication, verbal understanding indicated. Patient denies other needs at this time.

## 2021-09-01 NOTE — ED Notes (Signed)
Pt ambulatory down hallway attempting to enter another patient's room. Patient notified that he is not allowed to enter other patient's rooms and he needs to stay in his area, or walk to the bathroom. Verbal understanding indicated. Patient ambulatory to bathroom and back to chair in hallway without further incident.

## 2021-09-01 NOTE — ED Notes (Signed)
Pt given 3 sprites and 5 packs of graham crackers

## 2021-09-01 NOTE — ED Notes (Signed)
Pt. Ambulate to nurses station and back to recliner with walker ,independently.

## 2021-09-01 NOTE — ED Notes (Signed)
TOC placement 

## 2021-09-01 NOTE — ED Notes (Signed)
Pt resting in chair with eyes closed. Resps even regular and non-labored.

## 2021-09-01 NOTE — ED Notes (Signed)
Pt provided soda and graham crackers per request, bedside commode emptied of urine. Patient resting in chair, denies other needs at this time.

## 2021-09-01 NOTE — ED Notes (Signed)
Pt sitting in chair, resting, denies needs at this time.

## 2021-09-01 NOTE — ED Notes (Signed)
Pt. Requested and provided 2 sprites and 2 cups of ice

## 2021-09-01 NOTE — ED Provider Notes (Signed)
Today's Vitals   08/31/21 0836 08/31/21 1950 08/31/21 2050 09/01/21 0353  BP:  (!) 152/94  (!) 161/90  Pulse:  92  86  Resp:  18  18  Temp:  97.8 F (36.6 C)  98.4 F (36.9 C)  TempSrc:  Oral  Oral  SpO2:  97%  97%  Weight:      Height:      PainSc: 3  10-Worst pain ever 6  9    Body mass index is 23.75 kg/m.   Patient has no acute complaints.  Complains frequently of his chronic pain and frequently asking for his pain medication early.  Has been ambulatory throughout the ED and eating and awake all night long.  Awaiting social work disposition.   Trea Latner, Delice Bison, DO 09/01/21 [phone]

## 2021-09-01 NOTE — ED Notes (Signed)
Pt ambulatory to bathroom with assistance of walker, to desk to ask for more soda and graham crackers, provided by RN.

## 2021-09-01 NOTE — ED Notes (Signed)
Pt stating he is feeling anxious and is requesting PRN anxiety medication

## 2021-09-01 NOTE — ED Notes (Signed)
Pt. Up to bathroom indep. With walker, gait at baseline.

## 2021-09-01 NOTE — TOC Progression Note (Signed)
Transition of Care [facility]) - Progression Note    Patient Details  Name: Lance House MRN: [id number] Date of Birth: [birth year]  [facility] Care [facility]) CM/SW Contact  Shelbie Hutching, RN Phone Number: 09/01/2021, 4:33 PM  Clinical Narrative:    Holli Humbles pending.    Expected Discharge Plan: Skilled Nursing Facility Barriers to Discharge: SNF Pending bed offer, ED SNF auth  Expected Discharge Plan and Services Expected Discharge Plan: [place]   Discharge Planning Services: CM Consult Post Acute Care Choice: [place] Living arrangements for the past 2 months: Mobile Home                 DME Arranged: N/A DME Agency: NA       HH Arranged: NA           Social Determinants of Health (SDOH) Interventions    Readmission Risk Interventions No flowsheet data found.

## 2021-09-01 NOTE — ED Notes (Signed)
Pt. Ambulating in hall, gait steady.

## 2021-09-02 NOTE — ED Notes (Signed)
Pt ambulating in hallway with walker. No needs expressed at this time.

## 2021-09-02 NOTE — ED Notes (Signed)
Pt calm and cooperative, on phone. Refreshments provided.

## 2021-09-02 NOTE — ED Notes (Signed)
Pt given refreshments 

## 2021-09-02 NOTE — ED Notes (Signed)
Pt ambulating to toilet with walker

## 2021-09-02 NOTE — ED Notes (Signed)
Pt noted to be walking around the ER. Directed back to room. Pt using walker without issue. Denies further needs at this time.

## 2021-09-02 NOTE — ED Notes (Signed)
Pt given dinner tray.

## 2021-09-02 NOTE — ED Notes (Signed)
Pt was given breakfast but only wanted sprite and graham crackers. Pt is ambulatory to restroom.

## 2021-09-02 NOTE — ED Notes (Signed)
Pt is ambulating with his walker at this time. Pt provided drinks and gram crackers at this time.

## 2021-09-02 NOTE — ED Notes (Signed)
Pt ambulating in hallway with walker

## 2021-09-02 NOTE — ED Notes (Addendum)
Pt ambulating in hall with walker. Pt asking for lighter or "kitchen stove." Pt states "do not question me," when asked why he needs a lighter. Pt informed there are no lighters and reoriented to situation.

## 2021-09-03 NOTE — ED Notes (Signed)
Patient given graham crackers and drinks as requested. No other needs expressed at this time.

## 2021-09-03 NOTE — Progress Notes (Signed)
PT Cancellation Note  Patient Details Name: Lance House MRN: [id number] DOB: [birth year]   Cancelled Treatment:    Reason Eval/Treat Not Completed: Other (comment) Pt offered and encouraged to participate in session.  He declined stating his back hurts more today. Stated he is not wearing his TLSO "I don't walk that far to need it" but stated he walks to bathroom on unit and does not use BSC that is near bed.    Discussed with RN who confirmed he is walking but does state he does seem a bit unsteadier today possibly due to increased LE pain that he reports.  Pt is in recliner with both feet up on tray table that is a few inches higher than his hips.  Stated he was comfortable and "just got settled in."  Pt is encouraged to walk as mobility can help decrease pain but he continues to decline.  Stated he hopes to get SNF approval soon.   [name] 09/03/2021, 11:13 AM

## 2021-09-03 NOTE — ED Notes (Addendum)
Patient given more graham cracker and sprite as request. Lunch tray at bedside. No other needs expressed to RN at this time.

## 2021-09-03 NOTE — ED Notes (Signed)
Patient resting in recliner. Respirations even and unlabored. NAD noted.

## 2021-09-03 NOTE — ED Notes (Signed)
Breakfast tray at bedside 

## 2021-09-04 ENCOUNTER — Emergency Department: Payer: Medicare HMO

## 2021-09-04 DIAGNOSIS — R52 Pain, unspecified: Secondary | ICD-10-CM | POA: Diagnosis not present

## 2021-09-04 DIAGNOSIS — R531 Weakness: Secondary | ICD-10-CM | POA: Diagnosis not present

## 2021-09-04 DIAGNOSIS — S0990XA Unspecified injury of head, initial encounter: Secondary | ICD-10-CM | POA: Diagnosis not present

## 2021-09-04 DIAGNOSIS — R9082 White matter disease, unspecified: Secondary | ICD-10-CM | POA: Diagnosis not present

## 2021-09-04 DIAGNOSIS — Z9889 Other specified postprocedural states: Secondary | ICD-10-CM | POA: Diagnosis not present

## 2021-09-04 LAB — CBC WITH DIFFERENTIAL/PLATELET
Abs Immature Granulocytes: 0.07 10*3/uL (ref 0.00–0.07)
Basophils Absolute: 0.1 10*3/uL (ref 0.0–0.1)
Basophils Relative: 1 %
Eosinophils Absolute: 0.1 10*3/uL (ref 0.0–0.5)
Eosinophils Relative: 1 %
HCT: 41.8 % (ref 39.0–52.0)
Hemoglobin: 13.8 g/dL (ref 13.0–17.0)
Immature Granulocytes: 1 %
Lymphocytes Relative: 12 %
Lymphs Abs: 1.2 10*3/uL (ref 0.7–4.0)
MCH: 31.9 pg (ref 26.0–34.0)
MCHC: 33 g/dL (ref 30.0–36.0)
MCV: 96.8 fL (ref 80.0–100.0)
Monocytes Absolute: 1.1 10*3/uL — ABNORMAL HIGH (ref 0.1–1.0)
Monocytes Relative: 10 %
Neutro Abs: 8.2 10*3/uL — ABNORMAL HIGH (ref 1.7–7.7)
Neutrophils Relative %: 75 %
Platelets: 481 10*3/uL — ABNORMAL HIGH (ref 150–400)
RBC: 4.32 MIL/uL (ref 4.22–5.81)
RDW: 13.2 % (ref 11.5–15.5)
WBC: 10.8 10*3/uL — ABNORMAL HIGH (ref 4.0–10.5)
nRBC: 0 % (ref 0.0–0.2)

## 2021-09-04 LAB — COMPREHENSIVE METABOLIC PANEL
ALT: 11 U/L (ref 0–44)
AST: 15 U/L (ref 15–41)
Albumin: 2.9 g/dL — ABNORMAL LOW (ref 3.5–5.0)
Alkaline Phosphatase: 105 U/L (ref 38–126)
Anion gap: 5 (ref 5–15)
BUN: <5 mg/dL — ABNORMAL LOW (ref 8–23)
CO2: 30 mmol/L (ref 22–32)
Calcium: 8.6 mg/dL — ABNORMAL LOW (ref 8.9–10.3)
Chloride: 93 mmol/L — ABNORMAL LOW (ref 98–111)
Creatinine, Ser: 0.42 mg/dL — ABNORMAL LOW (ref 0.61–1.24)
GFR, Estimated: >60 mL/min (ref >60)
Glucose, Bld: 98 mg/dL (ref 70–99)
Potassium: 4 mmol/L (ref 3.5–5.1)
Sodium: 128 mmol/L — ABNORMAL LOW (ref 135–145)
Total Bilirubin: 0.7 mg/dL (ref 0.3–1.2)
Total Protein: 6.5 g/dL (ref 6.5–8.1)

## 2021-09-04 LAB — CK: Total CK: 59 U/L (ref 49–397)

## 2021-09-04 IMAGING — CT CT HEAD W/O CM
5 of 6 series · 16 of 47 positions shown, 17 images · non-contrast
Comparison: [DATE]

CLINICAL DATA: Minor head trauma.



[Series 3: head wo · axial · 0.44mm/px · z∈[-46,+4]mm · 2 of 30 slices shown]
[im 10/30  brain]
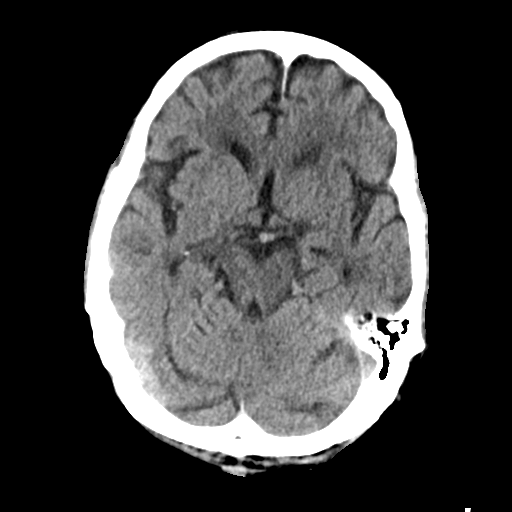
[im 20/30  brain]
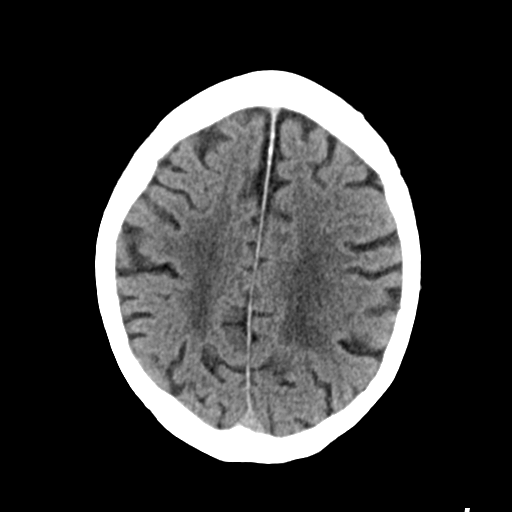

[Series 4: head wo recon · axial · 0.37mm/px · z∈[-81,-1]mm · 3 of 34 slices shown, 4 images]
[im 9/34  brain]
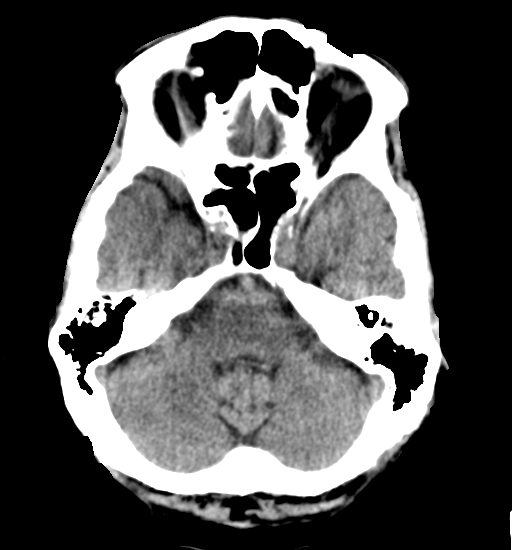
[im 9/34  bone]
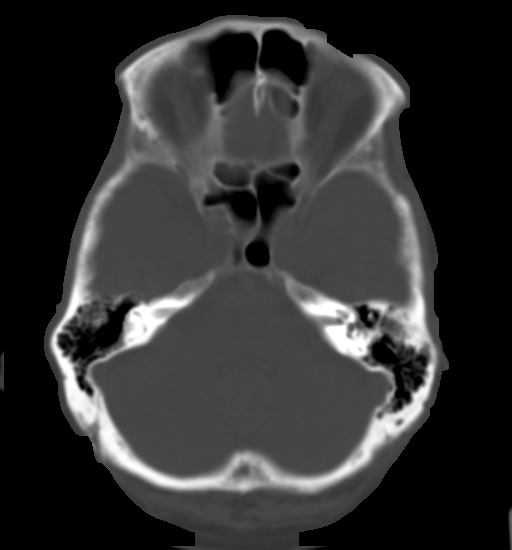
[im 17/34  brain]
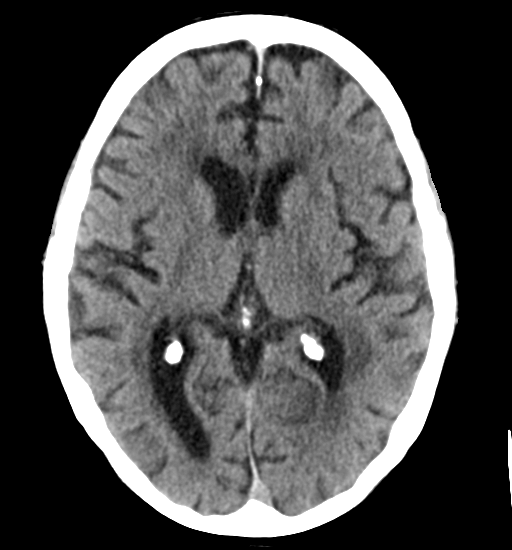
[im 25/34  brain]
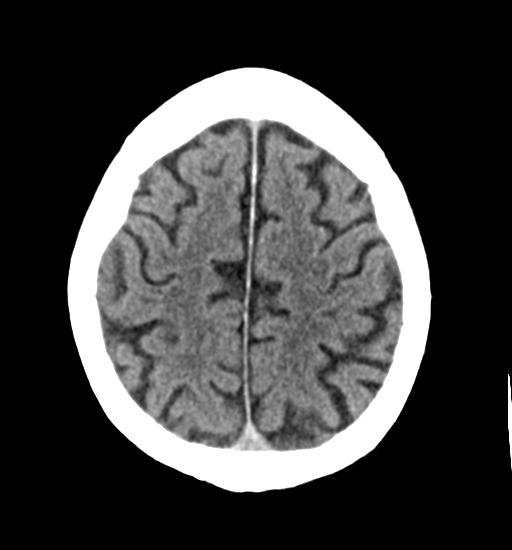

[Series 5: head bone recon · axial · 0.37mm/px · z∈[-108,-24]mm · 5 of 85 slices shown]
[im 9/85  bone]
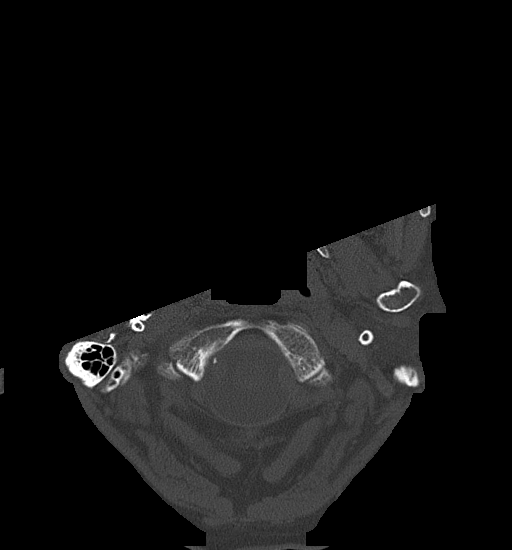
[im 17/85  bone]
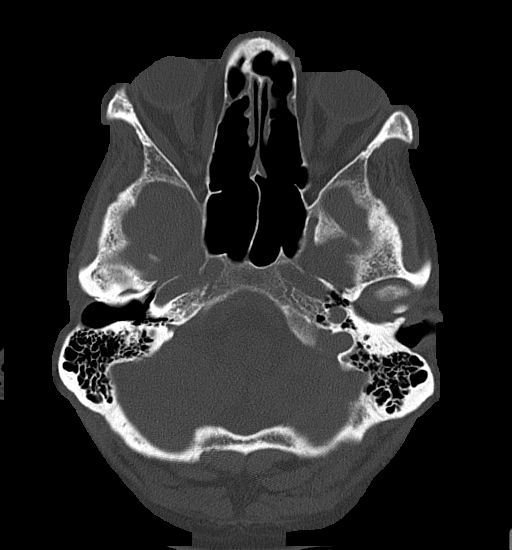
[im 26/85  bone]
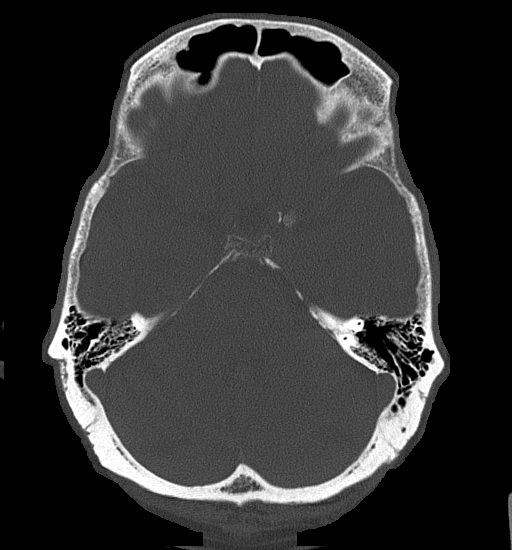
[im 34/85  bone]
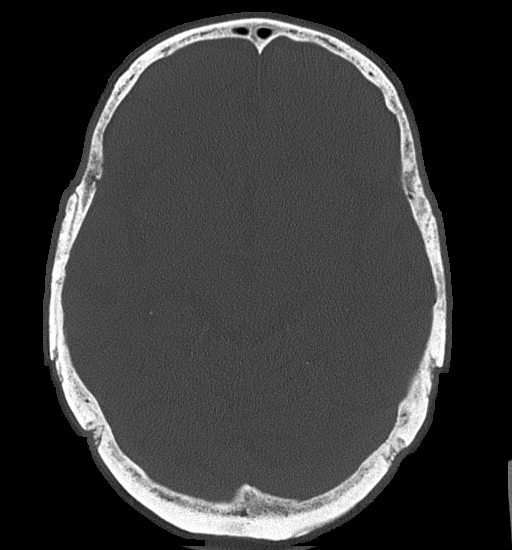
[im 51/85  bone]
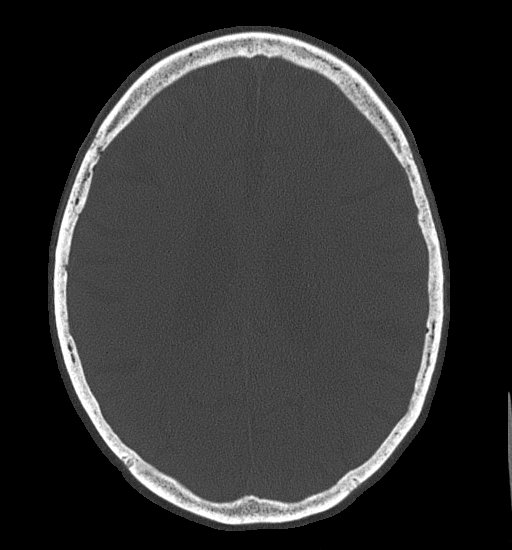

[Series 6: coronal soft tissue · coronal · 0.34mm/px · 3 of 69 slices shown]
[im 23/69  brain]
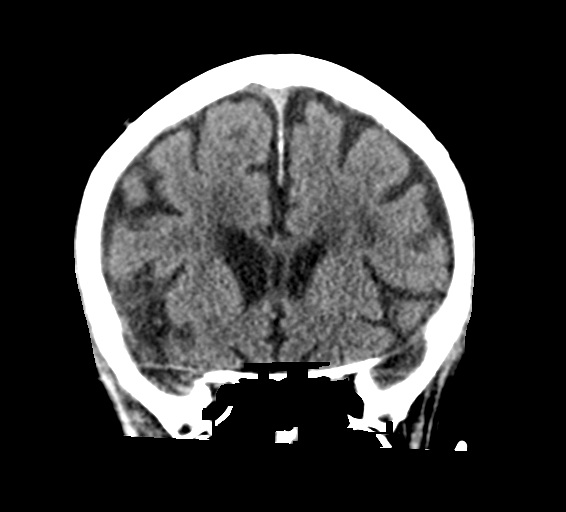
[im 31/69  brain]
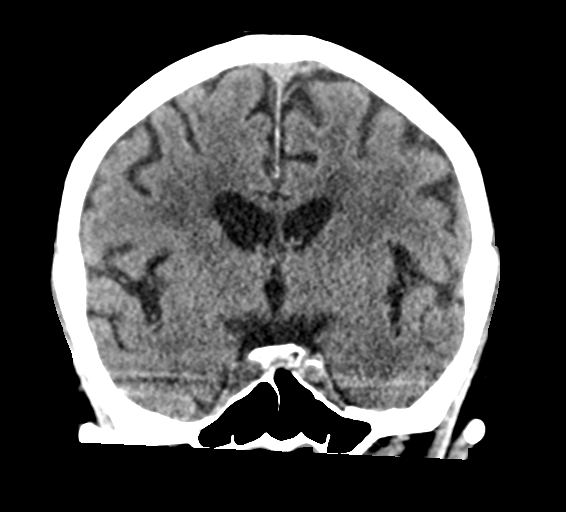
[im 38/69  brain]
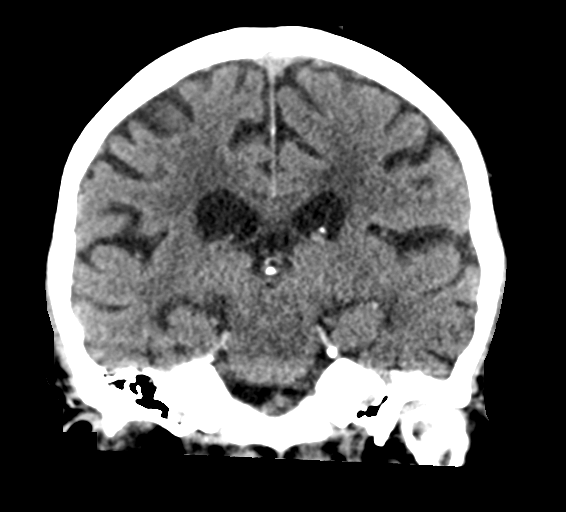

[Series 7: sagittal soft tissue · sagittal · 0.34mm/px · 3 of 57 slices shown]
[im 19/57  brain]
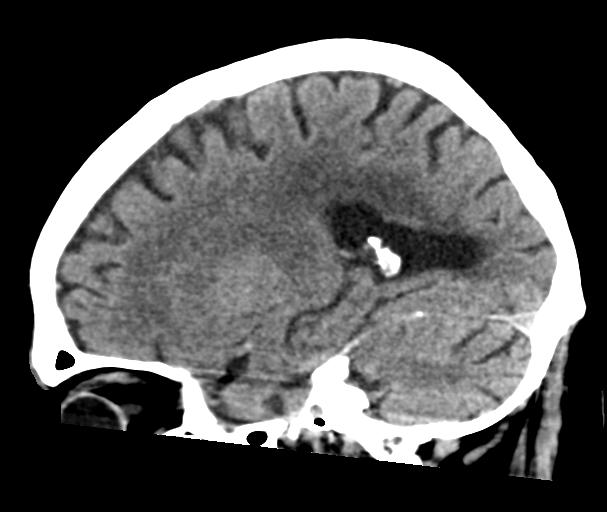
[im 29/57  brain]
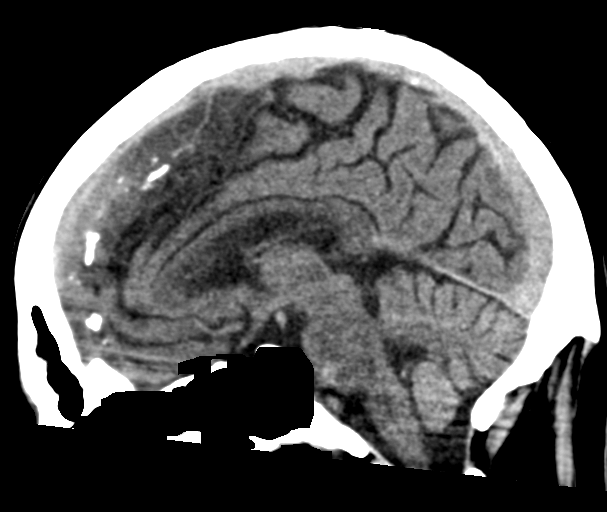
[im 38/57  brain]
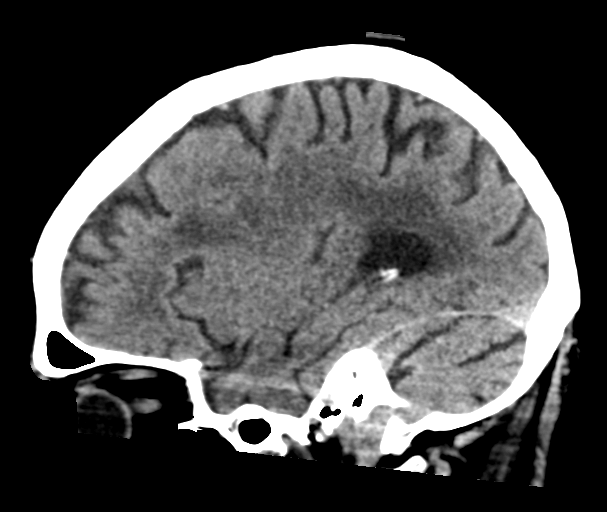

[16 of 47 positions shown; findings below may reference images not displayed]

FINDINGS: Brain: 1.1 by 0.6 by 0.6 cm subependymal hyperdensity along the
right parietal lobe, image 17 series 3, no change from [DATE].
Internal density about 60 Hounsfield units.

Periventricular white matter and corona radiata hypodensities favor
chronic ischemic microvascular white matter disease.

Otherwise, the brainstem, cerebellum, cerebral peduncles, thalamus,
basal ganglia, basilar cisterns, and ventricular system appear
within normal limits. No acute CVA identified.

Vascular: Unremarkable

Skull: Unremarkable

Sinuses/Orbits: Prior ethmoidectomies and maxillary antrostomies.
The sphenoid sinuses have been unroofed. No current substantial
paranasal sinusitis.

Other: No supplemental non-categorized findings.
IMPRESSION: 1. No change in the right subependymal nodule along the right
parietal lobe, which measures about 60 Hounsfield units.
Possibilities may include subependymal hamartoma, cavernoma, or
recent hemorrhage. If the patient is cooperative with further
imaging, consider MRI for further characterization.
2. Periventricular white matter and corona radiata hypodensities
favor chronic ischemic microvascular white matter disease.
3. Postoperative findings in the paranasal sinuses.

## 2021-09-04 MED ORDER — OXYCODONE HCL 5 MG PO TABS: 10.0000 mg | ORAL_TABLET | ORAL | Status: DC | PRN

## 2021-09-04 MED ORDER — OXYCODONE HCL 5 MG PO CAPS: 10.0000 mg | ORAL_CAPSULE | ORAL | 0 refills | Status: DC | PRN

## 2021-09-04 NOTE — ED Notes (Addendum)
Pt alert, confused. OOB with walker independently. Pt coming to desk frequently asking for pain medicine. Per pt he has chronic back pain 10/10. Pt seems more confused today. EDP notified - see orders.

## 2021-09-04 NOTE — Progress Notes (Addendum)
Physical Therapy Treatment Patient Details Name: Lance House MRN: [id number] DOB: 1951/05/14 Today's Date: 09/04/2021   History of Present Illness Pt is a 71 y.o. male with past medical history of EtOH use disorder, anxiety, depression, who presented from his rehab facility because of hip pain.  Patient was recently discharged from the hospital where he was admitted after a fall found to have T12, L1, and L4 compression fractures and a small ICH. Pt left rehab facility AMA.    PT Comments    Agrees with encouragement.  Stated he continues to walk on unit with out assist to bathroom.  Continues to refuse to wear TLSO.  Stated he has increased back pain from sitting on commode and reaching forward to pick objects up off floor.  Education on reasons for TLSO and to avoid forward flexion but he continues to refuse to wear it or to re-try brace.  He is able to walk 200' on unit with RW and distant supervision.    TOC in during session.  SNF has been denied.  Given overall improvement in mobility and independent mobility on unit will update DC plan to HHPT.  Will request RW for home.   Recommendations for follow up therapy are one component of a multi-disciplinary discharge planning process, led by the attending physician.  Recommendations may be updated based on patient status, additional functional criteria and insurance authorization.  Follow Up Recommendations  Home health PT     Assistance Recommended at Discharge Intermittent Supervision/Assistance  Patient can return home with the following A little help with walking and/or transfers;Assistance with cooking/housework;Assist for transportation;A little help with bathing/dressing/bathroom;Help with stairs or ramp for entrance   Equipment Recommendations  Rolling walker (2 wheels)    Recommendations for Other Services       Precautions / Restrictions Precautions Precautions: Fall Precaution Comments: TLSO, pt refuses to  wear Restrictions Weight Bearing Restrictions: No     Mobility  Bed Mobility               General bed mobility comments: NT, pt in recliner    Transfers Overall transfer level: Needs assistance Equipment used: Rolling walker (2 wheels)   Sit to Stand: Modified independent (Device/Increase time)                Ambulation/Gait Ambulation/Gait assistance: Modified independent (Device/Increase time), Supervision Gait Distance (Feet): 200 Feet Assistive device: Rolling walker (2 wheels) Gait Pattern/deviations: Step-through pattern, Decreased step length - right, Decreased step length - left, Trunk flexed, Drifts right/left, Wide base of support Gait velocity: decreased         Stairs             Wheelchair Mobility    Modified Rankin (Stroke Patients Only)       Balance Overall balance assessment: Needs assistance, History of Falls Sitting-balance support: No upper extremity supported, Feet supported Sitting balance-Leahy Scale: Good     Standing balance support: Bilateral upper extremity supported, During functional activity Standing balance-Leahy Scale: Fair Standing balance comment: keeps B legs and feet externally rotated                            Cognition Arousal/Alertness: Awake/alert Behavior During Therapy: WFL for tasks assessed/performed Overall Cognitive Status: No family/caregiver present to determine baseline cognitive functioning  Exercises      General Comments        Pertinent Vitals/Pain Pain Assessment Pain Assessment: Faces Faces Pain Scale: Hurts little more Pain Location: back pain - reports bending over in bathroom sitting on commode to pick objects off of floor increasing back pain - education provided Pain Descriptors / Indicators: Aching, Sore Pain Intervention(s): Limited activity within patient's tolerance, Monitored during session,  Repositioned    Home Living                          Prior Function            PT Goals (current goals can now be found in the care plan section) Progress towards PT goals: Progressing toward goals    Frequency    Min 2X/week      PT Plan Discharge plan needs to be updated    Co-evaluation              AM-PAC PT "6 Clicks" Mobility   Outcome Measure  Help needed turning from your back to your side while in a flat bed without using bedrails?: A Little Help needed moving from lying on your back to sitting on the side of a flat bed without using bedrails?: A Little Help needed moving to and from a bed to a chair (including a wheelchair)?: None Help needed standing up from a chair using your arms (e.g., wheelchair or bedside chair)?: None Help needed to walk in hospital room?: None Help needed climbing 3-5 steps with a railing? : A Little 6 Click Score: 21    End of Session Equipment Utilized During Treatment: Gait belt Activity Tolerance: Patient tolerated treatment well Patient left: in chair;with call bell/phone within reach Nurse Communication: Mobility status PT Visit Diagnosis: Unsteadiness on feet (R26.81);History of falling (Z91.81);Muscle weakness (generalized) (M62.81);Pain;Difficulty in walking, not elsewhere classified (R26.2)     Time: 3300-7622 PT Time Calculation (min) (ACUTE ONLY): 9 min  Charges:  $Gait Training: 8-22 mins                    [name], PTA 09/04/21, 1:53 PM

## 2021-09-04 NOTE — TOC Progression Note (Signed)
Transition of Care [facility]) - Progression Note    Patient Details  Name: Lance House MRN: [id number] Date of Birth: 1950-09-09  [facility] CM/SW Contact  Shelbie Hutching, RN Phone Number: 09/04/2021, 4:24 PM  Clinical Narrative:    [facility] will not be able to see patient for 7- 10 days.  Patient needs follow up sooner than that.  Sharmon Revere with [facility] accepted referral for RN, PT, OT and SW.     Expected Discharge Plan: Skilled Nursing Facility Barriers to Discharge: Barriers Resolved  Expected Discharge Plan and Services Expected Discharge Plan: [place]   Discharge Planning Services: CM Consult Post Acute Care Choice: [place] Living arrangements for the past 2 months: Mobile Home                 DME Arranged: Walker rolling DME Agency: [employer] Date DME Agency Contacted: 09/04/21 Time DME Agency Contacted: [phone] Representative spoke with at DME Agency: Andee Poles HH Arranged: RN, PT, OT, Social Work [employer] Agency: [place] Date [place]: 09/04/21 Time [place]: 1623 Representative spoke with at [place]: [place] ([place]) Interventions    Readmission Risk Interventions No flowsheet data found.

## 2021-09-04 NOTE — ED Provider Notes (Signed)
8:34 AM Assumed care for off going team.   Blood pressure 132/84, pulse 88, temperature 98.8 F (37.1 C), temperature source Oral, resp. rate (!) 21, height 6\' 1"  (1.854 m), weight 81.6 kg, SpO2 98 %.  Concern from nursing staff the patient may be a little bit more confused than prior.  We will get some repeat labs given its been a few days since having them as well as a repeat CT head  CT imaging is stable from a few days ago.  They state that we could get an MRI to further evaluate that area but again I only gets going to change our management given it is unchanged from prior.   Repeat labs show stable CK.  White count slightly elevated but no other infectious symptoms.  Sodium is slightly low but patient is already getting sodium tablets  Patient handed off continue to pend placement    Vanessa Gibbon, MD 09/04/21 1226

## 2021-09-04 NOTE — ED Provider Notes (Addendum)
----------------------------------------- °  7:22 PM on 09/04/2021 ----------------------------------------- Currently patient seems okay.  He has been walking around normally.  He is asking for his pain medicine but will wait when we tell him its not time yet.  Wife is now here to pick him up.  Unless she says something is different I will let him go.   Nena Polio, MD 09/04/21 1923 ----------------------------------------- 7:33 PM on 09/04/2021 ----------------------------------------- Spoke with patient's wife she feels he is fine.  She will take him home.  I did give him some more oxycodone as he asked for some more to go home with as he was out.   Nena Polio, MD 09/04/21 [phone]

## 2021-09-04 NOTE — ED Notes (Signed)
Pt has had bowel movement in pants but will not change pants or cleanse self.

## 2021-09-04 NOTE — Discharge Instructions (Addendum)
Continue his home medications.  We have asked for home health and physical therapy.  It may take a day or 2 for these to happen because we just put the request in today when the insurance turned him down for the nursing home.  Please return for any problems.

## 2021-09-04 NOTE — TOC Transition Note (Signed)
Transition of Care [facility]) - CM/SW Discharge Note   Patient Details  Name: Lance House MRN: [id number] Date of Birth: Mar 20, 1951  [facility]) CM/SW Contact:  Shelbie Hutching, RN Phone Number: 09/04/2021, 2:48 PM   Clinical Narrative:    [facility] has denied insurance authorization for SNF.  Patient and patient's wife updated.  PT worked with patient today and he walked well with the walker down the hall and then back to his recliner.  Patient does not have a walker at home so walker ordered from [employer] and delivered to the ED.  RNCM spoke with patient's wife, Lance House, she agrees with home health services.  [facility] accepted [facility] referral for RN, PT, OT, aide and SW.  Patient's wife will pick him up before 7 pm tonight once she gets off work.     Final next level of care: Home w Home Health Services Barriers to Discharge: Barriers Resolved   Patient Goals and CMS Choice Patient states their goals for this hospitalization and ongoing recovery are:: Patient and wife agree for patient to go home with home health services CMS Medicare.gov Compare Post Acute Care list provided to:: Patient Represenative (must comment) Choice offered to / list presented to : Spouse  Discharge Placement                       Discharge Plan and Services   Discharge Planning Services: CM Consult Post Acute Care Choice: [place]          DME Arranged: Gilford Rile rolling DME Agency: [employer] Date DME Agency Contacted: 09/04/21 Time DME Agency Contacted: 8101 Representative spoke with at DME Agency: [place]: RN, PT, OT, Nurse's Aide, Social Work [employer] Agency: [place] Date [place]: 09/04/21 Time [place]: 1448 Representative spoke with at [place]: [place]  Social Determinants of Health ([place]) Interventions     Readmission Risk Interventions No flowsheet data found.

## 2021-09-04 NOTE — ED Provider Notes (Signed)
----------------------------------------- °  7:15 AM on 09/04/2021 -----------------------------------------   Blood pressure 132/84, pulse 88, temperature 98.8 F (37.1 C), temperature source Oral, resp. rate (!) 21, height 1.854 m (6\' 1" ), weight 81.6 kg, SpO2 98 %.  The patient is calm and cooperative at this time.  There have been no acute events since the last update.  Awaiting disposition plan from [facility] team.  The patient continues to stay up all night, eating snacks and walking back-and-forth up and down the hallways with his walker, talking to staff, occasionally requiring redirection if he tries to go into other rooms.   Hinda Kehr, MD 09/04/21 [phone]

## 2021-09-10 ENCOUNTER — Inpatient Hospital Stay (HOSPITAL_COMMUNITY)
Admission: AD | Admit: 2021-09-10 | Discharge: 2021-09-27 | DRG: 853 | Disposition: A | Discharge status: Skilled Nursing Facility | Payer: Medicare HMO | Source: Other Acute Inpatient Hospital | Attending: Internal Medicine | Admitting: Internal Medicine

## 2021-09-10 ENCOUNTER — Emergency Department: Payer: Medicare HMO

## 2021-09-10 ENCOUNTER — Encounter (HOSPITAL_COMMUNITY): Payer: Self-pay | Admitting: Critical Care Medicine

## 2021-09-10 ENCOUNTER — Emergency Department
Admission: EM | Admit: 2021-09-10 | Discharge: 2021-09-10 | Disposition: A | Discharge status: Other Acute Inpatient Hospital | Payer: Medicare HMO | Attending: Emergency Medicine | Admitting: Emergency Medicine

## 2021-09-10 ENCOUNTER — Other Ambulatory Visit: Payer: Self-pay

## 2021-09-10 DIAGNOSIS — K7010 Alcoholic hepatitis without ascites: Secondary | ICD-10-CM | POA: Diagnosis not present

## 2021-09-10 DIAGNOSIS — Z807 Family history of other malignant neoplasms of lymphoid, hematopoietic and related tissues: Secondary | ICD-10-CM

## 2021-09-10 DIAGNOSIS — J9602 Acute respiratory failure with hypercapnia: Secondary | ICD-10-CM | POA: Diagnosis not present

## 2021-09-10 DIAGNOSIS — R0682 Tachypnea, not elsewhere classified: Secondary | ICD-10-CM | POA: Diagnosis not present

## 2021-09-10 DIAGNOSIS — I700 Atherosclerosis of aorta: Secondary | ICD-10-CM | POA: Diagnosis not present

## 2021-09-10 DIAGNOSIS — A419 Sepsis, unspecified organism: Secondary | ICD-10-CM | POA: Diagnosis not present

## 2021-09-10 DIAGNOSIS — Z79899 Other long term (current) drug therapy: Secondary | ICD-10-CM | POA: Diagnosis not present

## 2021-09-10 DIAGNOSIS — Z87891 Personal history of nicotine dependence: Secondary | ICD-10-CM

## 2021-09-10 DIAGNOSIS — I878 Other specified disorders of veins: Secondary | ICD-10-CM | POA: Diagnosis present

## 2021-09-10 DIAGNOSIS — Z781 Physical restraint status: Secondary | ICD-10-CM

## 2021-09-10 DIAGNOSIS — A408 Other streptococcal sepsis: Secondary | ICD-10-CM | POA: Diagnosis not present

## 2021-09-10 DIAGNOSIS — R339 Retention of urine, unspecified: Secondary | ICD-10-CM | POA: Diagnosis not present

## 2021-09-10 DIAGNOSIS — G8929 Other chronic pain: Secondary | ICD-10-CM | POA: Diagnosis not present

## 2021-09-10 DIAGNOSIS — R55 Syncope and collapse: Secondary | ICD-10-CM | POA: Diagnosis not present

## 2021-09-10 DIAGNOSIS — Z043 Encounter for examination and observation following other accident: Secondary | ICD-10-CM | POA: Diagnosis not present

## 2021-09-10 DIAGNOSIS — R6521 Severe sepsis with septic shock: Secondary | ICD-10-CM | POA: Diagnosis not present

## 2021-09-10 DIAGNOSIS — E876 Hypokalemia: Secondary | ICD-10-CM | POA: Diagnosis not present

## 2021-09-10 DIAGNOSIS — R918 Other nonspecific abnormal finding of lung field: Secondary | ICD-10-CM | POA: Diagnosis not present

## 2021-09-10 DIAGNOSIS — J90 Pleural effusion, not elsewhere classified: Secondary | ICD-10-CM

## 2021-09-10 DIAGNOSIS — F419 Anxiety disorder, unspecified: Secondary | ICD-10-CM | POA: Diagnosis present

## 2021-09-10 DIAGNOSIS — J189 Pneumonia, unspecified organism: Secondary | ICD-10-CM | POA: Diagnosis present

## 2021-09-10 DIAGNOSIS — R0602 Shortness of breath: Secondary | ICD-10-CM

## 2021-09-10 DIAGNOSIS — Z9114 Patient's other noncompliance with medication regimen: Secondary | ICD-10-CM

## 2021-09-10 DIAGNOSIS — J9601 Acute respiratory failure with hypoxia: Secondary | ICD-10-CM | POA: Diagnosis not present

## 2021-09-10 DIAGNOSIS — J948 Other specified pleural conditions: Secondary | ICD-10-CM | POA: Diagnosis not present

## 2021-09-10 DIAGNOSIS — J939 Pneumothorax, unspecified: Secondary | ICD-10-CM

## 2021-09-10 DIAGNOSIS — L89152 Pressure ulcer of sacral region, stage 2: Secondary | ICD-10-CM | POA: Diagnosis present

## 2021-09-10 DIAGNOSIS — G709 Myoneural disorder, unspecified: Secondary | ICD-10-CM | POA: Diagnosis not present

## 2021-09-10 DIAGNOSIS — R531 Weakness: Secondary | ICD-10-CM

## 2021-09-10 DIAGNOSIS — M2578 Osteophyte, vertebrae: Secondary | ICD-10-CM | POA: Diagnosis not present

## 2021-09-10 DIAGNOSIS — R079 Chest pain, unspecified: Secondary | ICD-10-CM | POA: Diagnosis not present

## 2021-09-10 DIAGNOSIS — E441 Mild protein-calorie malnutrition: Secondary | ICD-10-CM | POA: Diagnosis not present

## 2021-09-10 DIAGNOSIS — M549 Dorsalgia, unspecified: Secondary | ICD-10-CM | POA: Diagnosis not present

## 2021-09-10 DIAGNOSIS — R0789 Other chest pain: Secondary | ICD-10-CM | POA: Diagnosis not present

## 2021-09-10 DIAGNOSIS — I10 Essential (primary) hypertension: Secondary | ICD-10-CM | POA: Diagnosis present

## 2021-09-10 DIAGNOSIS — F1020 Alcohol dependence, uncomplicated: Secondary | ICD-10-CM | POA: Insufficient documentation

## 2021-09-10 DIAGNOSIS — R69 Illness, unspecified: Secondary | ICD-10-CM | POA: Diagnosis not present

## 2021-09-10 DIAGNOSIS — F418 Other specified anxiety disorders: Secondary | ICD-10-CM | POA: Diagnosis not present

## 2021-09-10 DIAGNOSIS — Z87820 Personal history of traumatic brain injury: Secondary | ICD-10-CM

## 2021-09-10 DIAGNOSIS — Z820 Family history of epilepsy and other diseases of the nervous system: Secondary | ICD-10-CM

## 2021-09-10 DIAGNOSIS — R6520 Severe sepsis without septic shock: Secondary | ICD-10-CM | POA: Diagnosis not present

## 2021-09-10 DIAGNOSIS — R109 Unspecified abdominal pain: Secondary | ICD-10-CM | POA: Diagnosis not present

## 2021-09-10 DIAGNOSIS — I21A1 Myocardial infarction type 2: Secondary | ICD-10-CM | POA: Diagnosis not present

## 2021-09-10 DIAGNOSIS — E222 Syndrome of inappropriate secretion of antidiuretic hormone: Secondary | ICD-10-CM | POA: Diagnosis not present

## 2021-09-10 DIAGNOSIS — R0689 Other abnormalities of breathing: Secondary | ICD-10-CM | POA: Diagnosis not present

## 2021-09-10 DIAGNOSIS — J869 Pyothorax without fistula: Secondary | ICD-10-CM | POA: Diagnosis present

## 2021-09-10 DIAGNOSIS — Z09 Encounter for follow-up examination after completed treatment for conditions other than malignant neoplasm: Secondary | ICD-10-CM

## 2021-09-10 DIAGNOSIS — J439 Emphysema, unspecified: Secondary | ICD-10-CM | POA: Diagnosis not present

## 2021-09-10 DIAGNOSIS — J9811 Atelectasis: Secondary | ICD-10-CM | POA: Diagnosis not present

## 2021-09-10 DIAGNOSIS — R059 Cough, unspecified: Secondary | ICD-10-CM | POA: Diagnosis not present

## 2021-09-10 DIAGNOSIS — L8990 Pressure ulcer of unspecified site, unspecified stage: Secondary | ICD-10-CM | POA: Insufficient documentation

## 2021-09-10 DIAGNOSIS — N179 Acute kidney failure, unspecified: Secondary | ICD-10-CM

## 2021-09-10 DIAGNOSIS — R091 Pleurisy: Secondary | ICD-10-CM | POA: Diagnosis not present

## 2021-09-10 DIAGNOSIS — M1990 Unspecified osteoarthritis, unspecified site: Secondary | ICD-10-CM | POA: Diagnosis not present

## 2021-09-10 DIAGNOSIS — I517 Cardiomegaly: Secondary | ICD-10-CM | POA: Diagnosis not present

## 2021-09-10 DIAGNOSIS — Z743 Need for continuous supervision: Secondary | ICD-10-CM | POA: Diagnosis not present

## 2021-09-10 DIAGNOSIS — Z20822 Contact with and (suspected) exposure to covid-19: Secondary | ICD-10-CM | POA: Insufficient documentation

## 2021-09-10 DIAGNOSIS — M109 Gout, unspecified: Secondary | ICD-10-CM | POA: Diagnosis not present

## 2021-09-10 DIAGNOSIS — Z808 Family history of malignant neoplasm of other organs or systems: Secondary | ICD-10-CM

## 2021-09-10 DIAGNOSIS — F1010 Alcohol abuse, uncomplicated: Secondary | ICD-10-CM | POA: Diagnosis present

## 2021-09-10 LAB — COMPREHENSIVE METABOLIC PANEL
ALT: 20 U/L (ref 0–44)
AST: 25 U/L (ref 15–41)
Albumin: 2.5 g/dL — ABNORMAL LOW (ref 3.5–5.0)
Alkaline Phosphatase: 84 U/L (ref 38–126)
Anion gap: 10 (ref 5–15)
BUN: 15 mg/dL (ref 8–23)
CO2: 28 mmol/L (ref 22–32)
Calcium: 8.2 mg/dL — ABNORMAL LOW (ref 8.9–10.3)
Chloride: 89 mmol/L — ABNORMAL LOW (ref 98–111)
Creatinine, Ser: 0.72 mg/dL (ref 0.61–1.24)
GFR, Estimated: >60 mL/min (ref >60)
Glucose, Bld: 103 mg/dL — ABNORMAL HIGH (ref 70–99)
Potassium: 3.5 mmol/L (ref 3.5–5.1)
Sodium: 127 mmol/L — ABNORMAL LOW (ref 135–145)
Total Bilirubin: 1.1 mg/dL (ref 0.3–1.2)
Total Protein: 6 g/dL — ABNORMAL LOW (ref 6.5–8.1)

## 2021-09-10 LAB — CBC WITH DIFFERENTIAL/PLATELET
Abs Immature Granulocytes: 0.33 10*3/uL — ABNORMAL HIGH (ref 0.00–0.07)
Basophils Absolute: 0.1 10*3/uL (ref 0.0–0.1)
Basophils Relative: 0 %
Eosinophils Absolute: 0 10*3/uL (ref 0.0–0.5)
Eosinophils Relative: 0 %
HCT: 45.3 % (ref 39.0–52.0)
Hemoglobin: 15.6 g/dL (ref 13.0–17.0)
Immature Granulocytes: 1 %
Lymphocytes Relative: 3 %
Lymphs Abs: 0.8 10*3/uL (ref 0.7–4.0)
MCH: 31 pg (ref 26.0–34.0)
MCHC: 34.4 g/dL (ref 30.0–36.0)
MCV: 89.9 fL (ref 80.0–100.0)
Monocytes Absolute: 1.5 10*3/uL — ABNORMAL HIGH (ref 0.1–1.0)
Monocytes Relative: 5 %
Neutro Abs: 28.1 10*3/uL — ABNORMAL HIGH (ref 1.7–7.7)
Neutrophils Relative %: 91 %
Platelets: 384 10*3/uL (ref 150–400)
RBC: 5.04 MIL/uL (ref 4.22–5.81)
RDW: 13.6 % (ref 11.5–15.5)
Smear Review: NORMAL
WBC: 30.8 10*3/uL — ABNORMAL HIGH (ref 4.0–10.5)
nRBC: 0 % (ref 0.0–0.2)

## 2021-09-10 LAB — URINE DRUG SCREEN, QUALITATIVE (ARMC ONLY)
Amphetamines, Ur Screen: NOT DETECTED
Barbiturates, Ur Screen: NOT DETECTED
Benzodiazepine, Ur Scrn: NOT DETECTED
Cannabinoid 50 Ng, Ur ~~LOC~~: NOT DETECTED
Cocaine Metabolite,Ur ~~LOC~~: NOT DETECTED
MDMA (Ecstasy)Ur Screen: NOT DETECTED
Methadone Scn, Ur: NOT DETECTED
Opiate, Ur Screen: POSITIVE — AB
Phencyclidine (PCP) Ur S: NOT DETECTED
Tricyclic, Ur Screen: NOT DETECTED

## 2021-09-10 LAB — URINALYSIS, ROUTINE W REFLEX MICROSCOPIC
Bacteria, UA: NONE SEEN
Bilirubin Urine: NEGATIVE
Glucose, UA: NEGATIVE mg/dL
Ketones, ur: 5 mg/dL — AB
Leukocytes,Ua: NEGATIVE
Nitrite: NEGATIVE
Protein, ur: NEGATIVE mg/dL
Specific Gravity, Urine: 1.02 (ref 1.005–1.030)
Squamous Epithelial / HPF: NONE SEEN (ref 0–5)
pH: 5 (ref 5.0–8.0)

## 2021-09-10 LAB — RESP PANEL BY RT-PCR (FLU A&B, COVID) ARPGX2
Influenza A by PCR: NEGATIVE
Influenza B by PCR: NEGATIVE
SARS Coronavirus 2 by RT PCR: NEGATIVE

## 2021-09-10 LAB — BASIC METABOLIC PANEL
Anion gap: 10 (ref 5–15)
BUN: 13 mg/dL (ref 8–23)
CO2: 22 mmol/L (ref 22–32)
Calcium: 7.8 mg/dL — ABNORMAL LOW (ref 8.9–10.3)
Chloride: 97 mmol/L — ABNORMAL LOW (ref 98–111)
Creatinine, Ser: 0.7 mg/dL (ref 0.61–1.24)
GFR, Estimated: >60 mL/min (ref >60)
Glucose, Bld: 108 mg/dL — ABNORMAL HIGH (ref 70–99)
Potassium: 3.7 mmol/L (ref 3.5–5.1)
Sodium: 129 mmol/L — ABNORMAL LOW (ref 135–145)

## 2021-09-10 LAB — MAGNESIUM: Magnesium: 1.7 mg/dL (ref 1.7–2.4)

## 2021-09-10 LAB — BLOOD GAS, VENOUS
Acid-Base Excess: 4.1 mmol/L — ABNORMAL HIGH (ref 0.0–2.0)
Bicarbonate: 28.5 mmol/L — ABNORMAL HIGH (ref 20.0–28.0)
O2 Saturation: 81.5 %
Patient temperature: 37
pCO2, Ven: 41 mmHg — ABNORMAL LOW (ref 44–60)
pH, Ven: 7.45 — ABNORMAL HIGH (ref 7.25–7.43)
pO2, Ven: 51 mmHg — ABNORMAL HIGH (ref 32–45)

## 2021-09-10 LAB — GLUCOSE, CAPILLARY
Glucose-Capillary: 107 mg/dL — ABNORMAL HIGH (ref 70–99)
Glucose-Capillary: 108 mg/dL — ABNORMAL HIGH (ref 70–99)

## 2021-09-10 LAB — PROCALCITONIN: Procalcitonin: 0.82 ng/mL

## 2021-09-10 LAB — LACTIC ACID, PLASMA
Lactic Acid, Venous: 2.1 mmol/L (ref 0.5–1.9)
Lactic Acid, Venous: 1.4 mmol/L (ref 0.5–1.9)

## 2021-09-10 LAB — BRAIN NATRIURETIC PEPTIDE: B Natriuretic Peptide: 50 pg/mL (ref 0.0–100.0)

## 2021-09-10 LAB — AMMONIA: Ammonia: 13 umol/L (ref 9–35)

## 2021-09-10 LAB — ETHANOL: Alcohol, Ethyl (B): <10 mg/dL (ref <10)

## 2021-09-10 LAB — OSMOLALITY: Osmolality: 268 mOsm/kg — ABNORMAL LOW (ref 275–295)

## 2021-09-10 LAB — TROPONIN I (HIGH SENSITIVITY)
Troponin I (High Sensitivity): 12 ng/L (ref <18)
Troponin I (High Sensitivity): 14 ng/L (ref <18)

## 2021-09-10 LAB — CK: Total CK: 135 U/L (ref 49–397)

## 2021-09-10 IMAGING — CT CT ANGIO CHEST
3 of 7 series · 18 of 36 positions shown · IV contrast (APPLIED)
Comparison: None.

CLINICAL DATA: Chest pain, weakness, recent fall

EXAM:
CT ANGIOGRAPHY CHEST WITH CONTRAST
TECHNIQUE: Multidetector CT imaging of the chest was performed using the
standard protocol during bolus administration of intravenous
contrast. Multiplanar CT image reconstructions and MIPs were
obtained to evaluate the vascular anatomy.

[Series 6: thins · axial · 0.82mm/px · z∈[-543,-245]mm · 15 of 342 slices shown]
[im 22/342  lung]
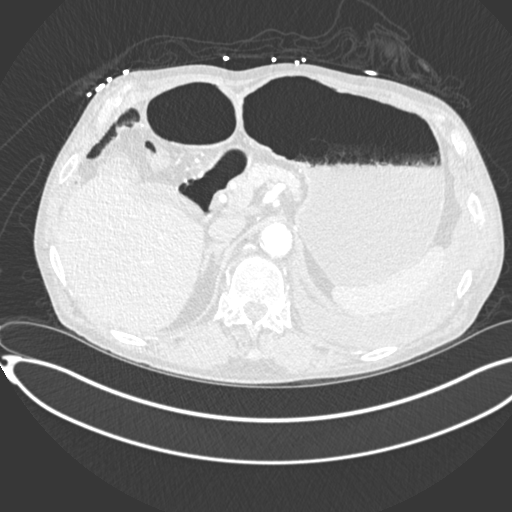
[im 43/342  mediastinal]
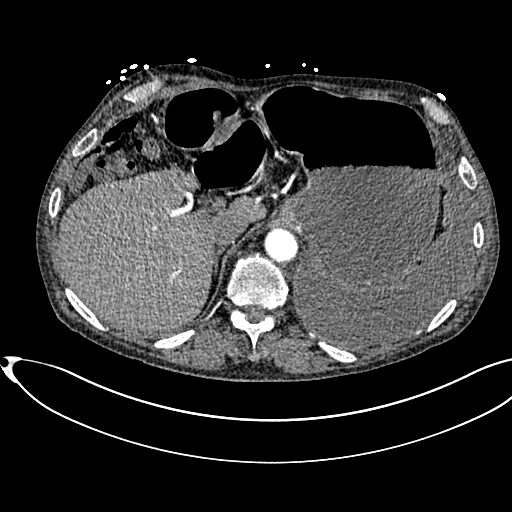
[im 64/342  lung]
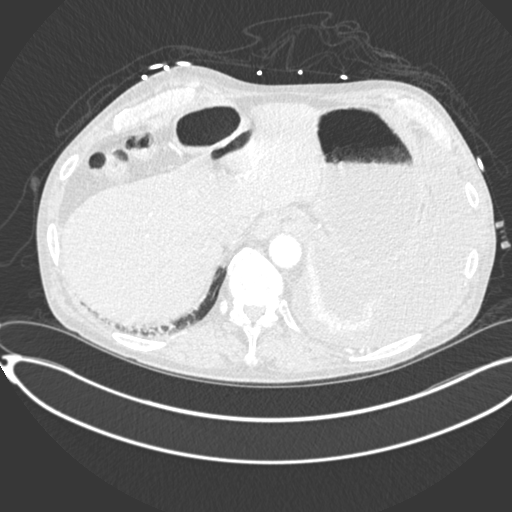
[im 86/342  mediastinal]
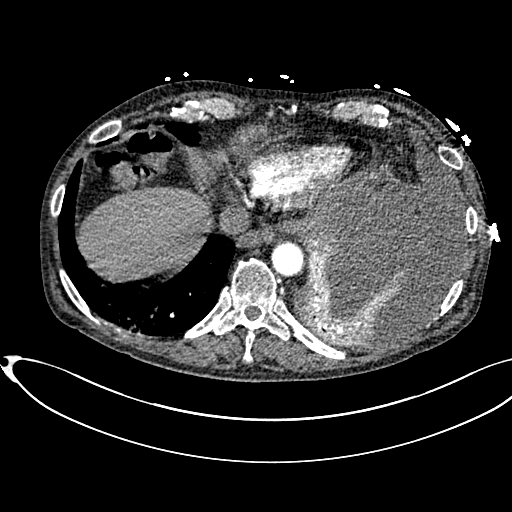
[im 107/342  lung]
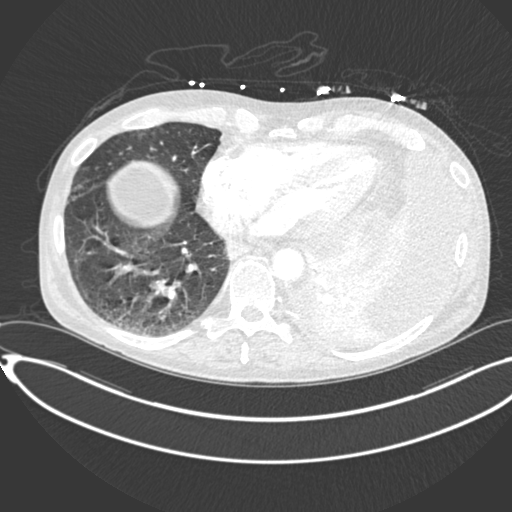
[im 128/342  mediastinal]
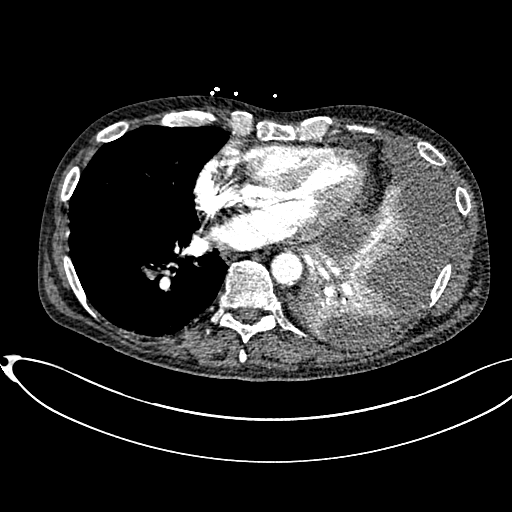
[im 150/342  lung]
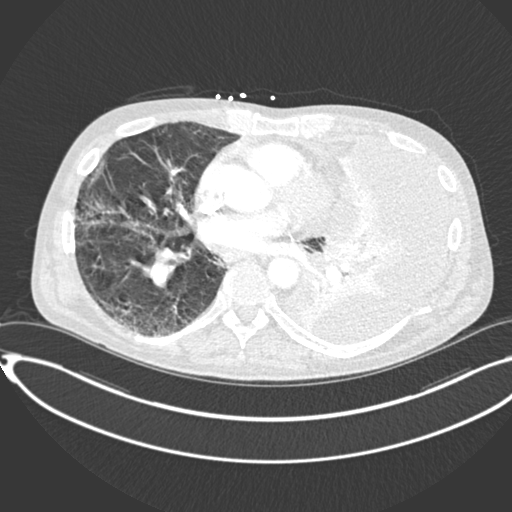
[im 171/342  mediastinal]
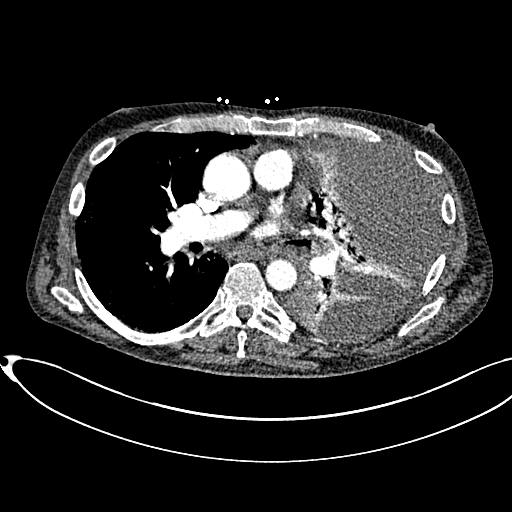
[im 192/342  lung]
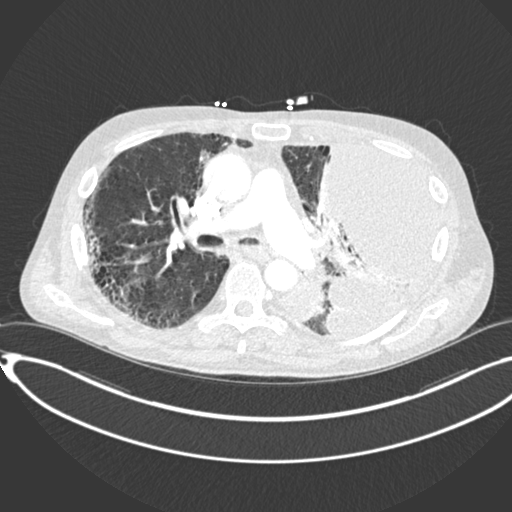
[im 214/342  mediastinal]
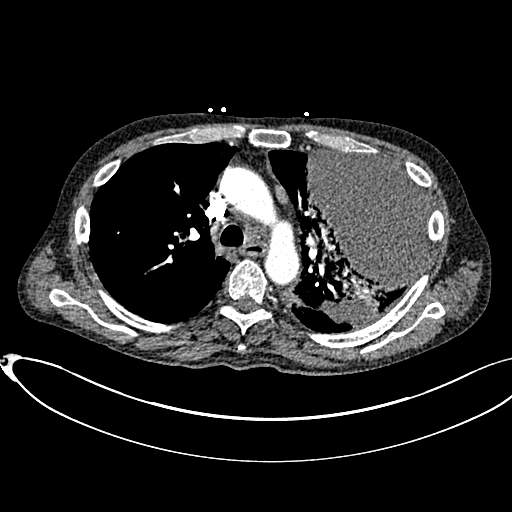
[im 235/342  lung]
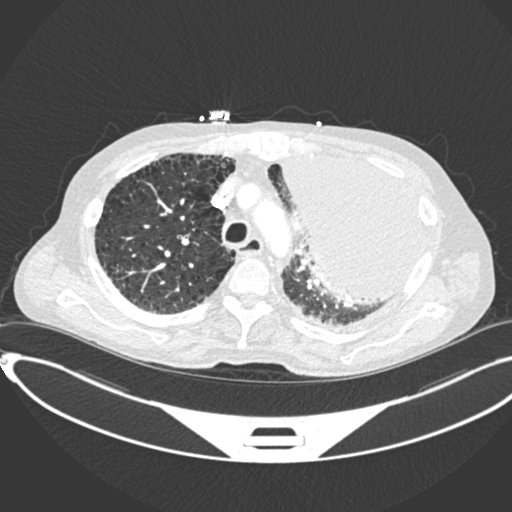
[im 256/342  mediastinal]
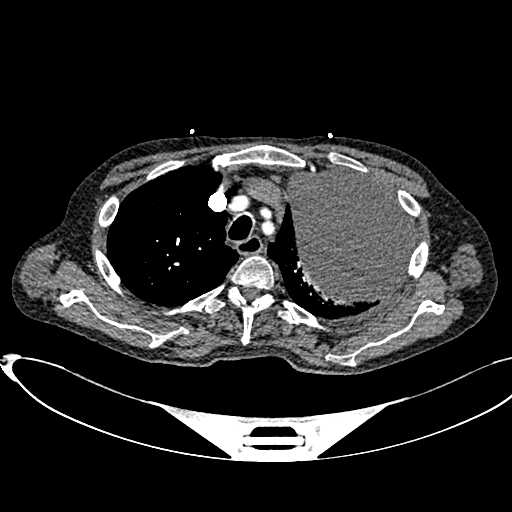
[im 278/342  lung]
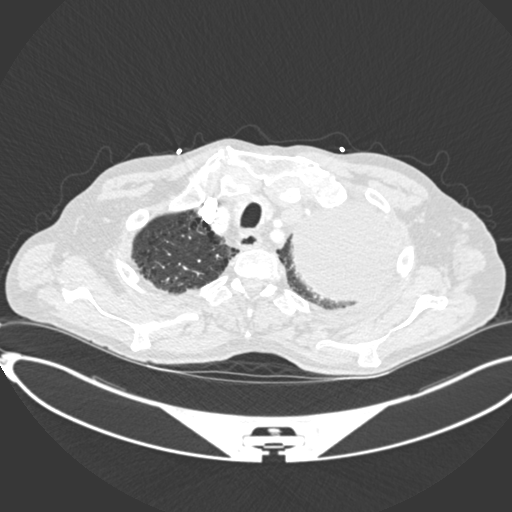
[im 299/342  mediastinal]
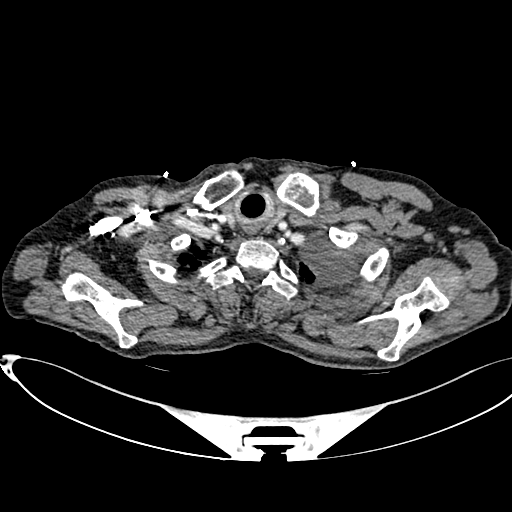
[im 320/342  lung]
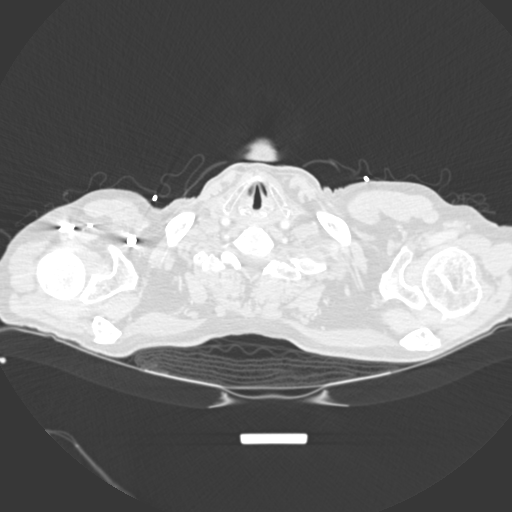

[Series 7: lung · axial · 0.82mm/px · z∈[-496,-427]mm · 2 of 114 slices shown]
[im 23/114  mediastinal]
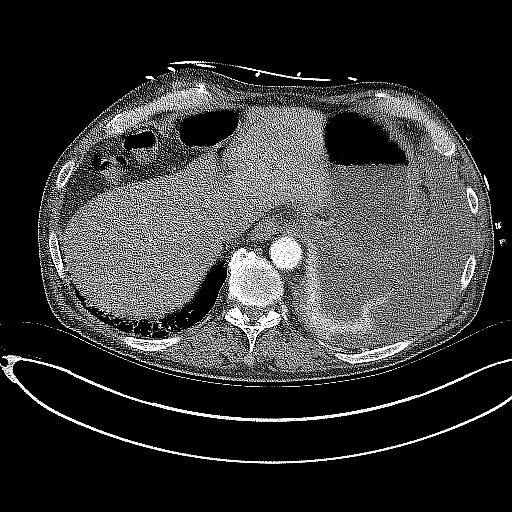
[im 46/114  mediastinal]
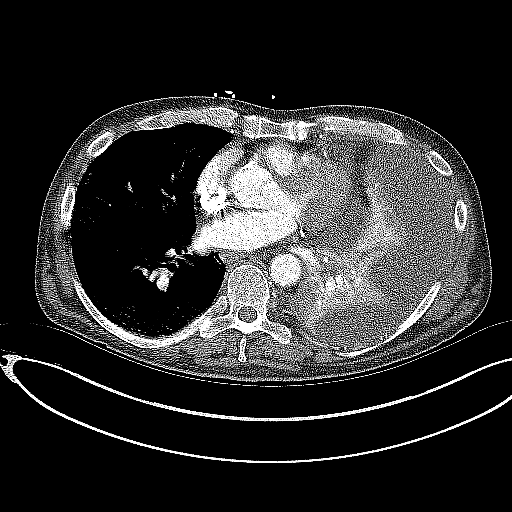

[Series 8: coronal mpr · coronal · 0.68mm/px · 1 of 118 slices shown]
[im 59/118  mediastinal]
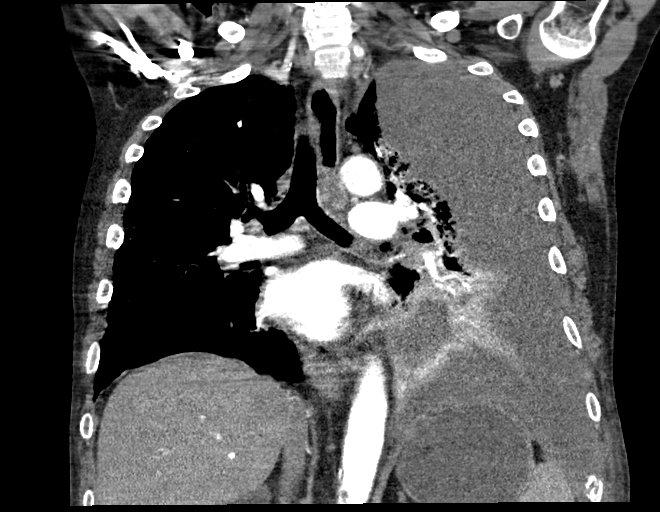

[18 of 36 positions shown; findings below may reference images not displayed]

RADIATION DOSE REDUCTION: This exam was performed according to the
departmental dose-optimization program which includes automated
exposure control, adjustment of the mA and/or kV according to
patient size and/or use of iterative reconstruction technique.

CONTRAST:  75mL OMNIPAQUE IOHEXOL 350 MG/ML SOLN
FINDINGS: Cardiovascular: Satisfactory opacification of the pulmonary arteries
to the segmental level. No evidence of pulmonary embolism. Normal
heart size. Left and right coronary artery calcifications. No
pericardial effusion. Aortic atherosclerosis.

Mediastinum/Nodes: No enlarged mediastinal, hilar, or axillary lymph
nodes. Thyroid gland, trachea, and esophagus demonstrate no
significant findings.

Lungs/Pleura: Large, loculated appearing left pleural effusion with
near-total atelectasis of the left lung. Moderate centrilobular and
paraseptal emphysema. Evaluation of the aerated portions of the
right lung is limited by breath motion artifact, however within this
limitation, there is dependent heterogeneous airspace opacity
superimposed upon emphysema (series 7, image 63).

Upper Abdomen: Please see separately reported examination of the
abdomen and pelvis.

Musculoskeletal: No chest wall abnormality. Subacute appearing,
partially callused fractures of the posterolateral left ninth and
tenth ribs (series 6, image 313, 334).

Review of the MIP images confirms the above findings.
IMPRESSION: 1. Negative examination for pulmonary embolism.
2. Large, loculated appearing left pleural effusion with near-total
atelectasis of the left lung.
3. Subacute appearing, partially callused fractures of the
posterolateral left ninth and tenth ribs.
4. Evaluation of the aerated portions of the right lung is limited
by breath motion artifact, however within this limitation, there is
dependent heterogeneous airspace opacity superimposed upon
emphysema. Findings are consistent with infection or aspiration.
5. Coronary artery disease.

Aortic Atherosclerosis ([VH]-[VH]) and Emphysema ([VH]-[VH]).

## 2021-09-10 IMAGING — CT CT ABD-PELV W/ CM
2 of 5 series · 16 of 46 positions shown, 18 images · IV contrast (APPLIED)
Comparison: CT lumbar spine, [DATE], CT abdomen pelvis,
[DATE]

CLINICAL DATA: Abdominal pain, weakness

EXAM:
CT ABDOMEN AND PELVIS WITH CONTRAST
TECHNIQUE: Multidetector CT imaging of the abdomen and pelvis was performed
using the standard protocol following bolus administration of
intravenous contrast.

[Series 4: axial st · axial · 0.79mm/px · z∈[-879,-554]mm · 13 of 76 slices shown, 15 images]
[im 6/76  soft-tissue]
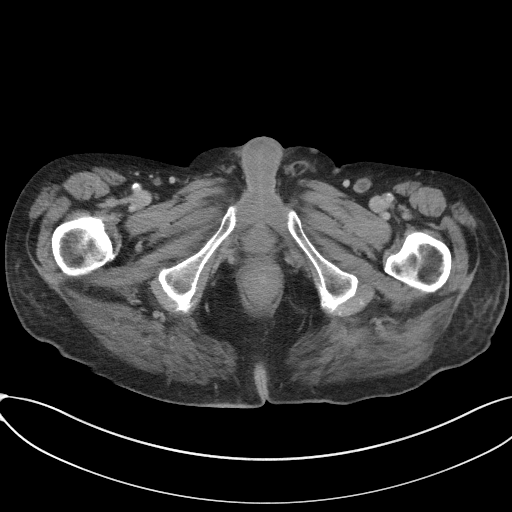
[im 6/76  bone]
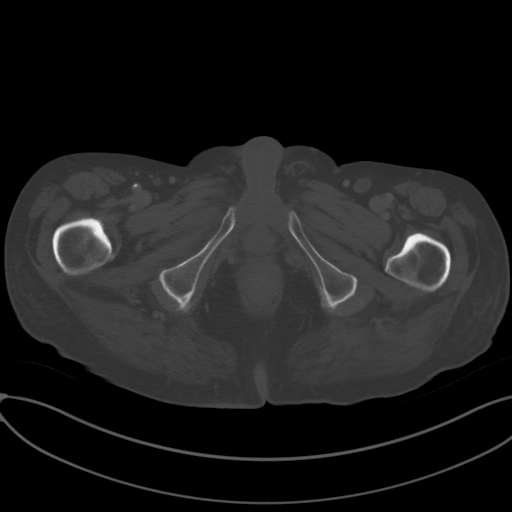
[im 11/76  soft-tissue]
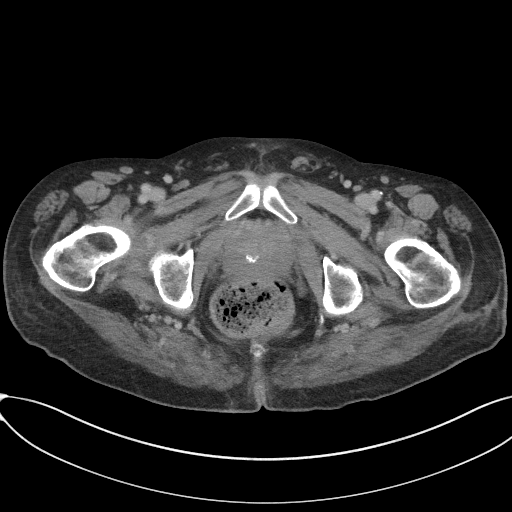
[im 16/76  soft-tissue]
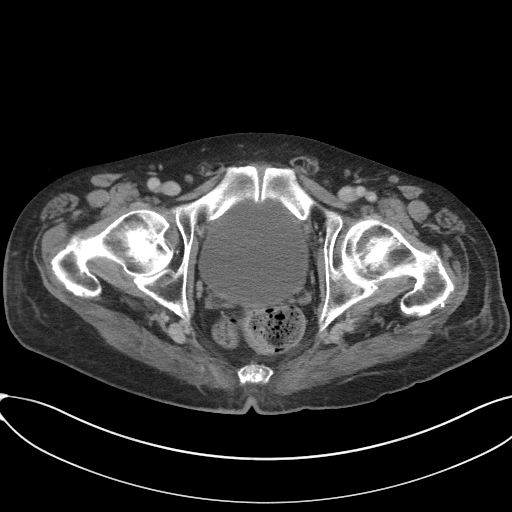
[im 21/76  soft-tissue]
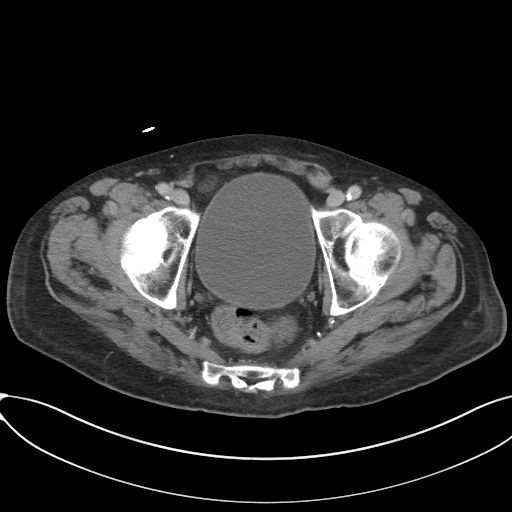
[im 26/76  soft-tissue]
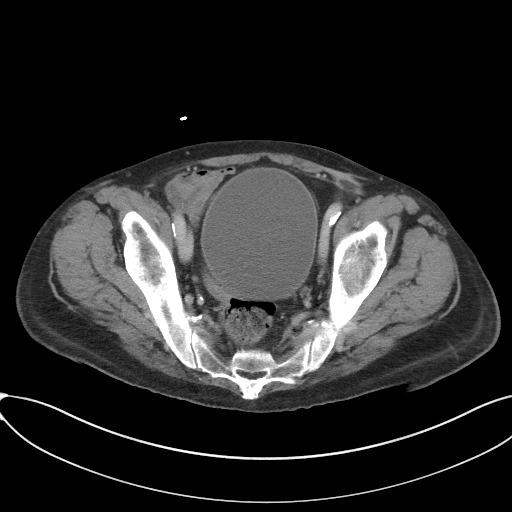
[im 31/76  soft-tissue]
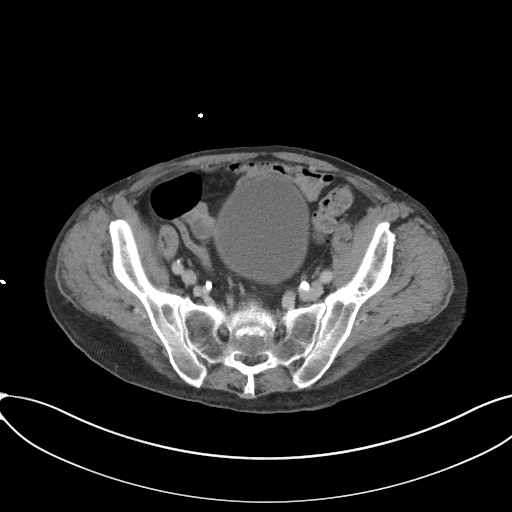
[im 41/76  soft-tissue]
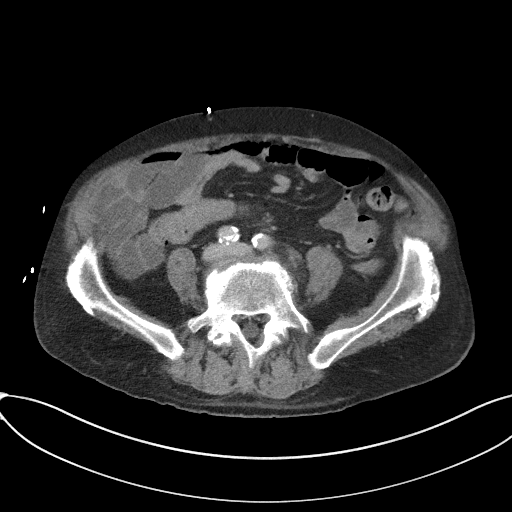
[im 46/76  soft-tissue]
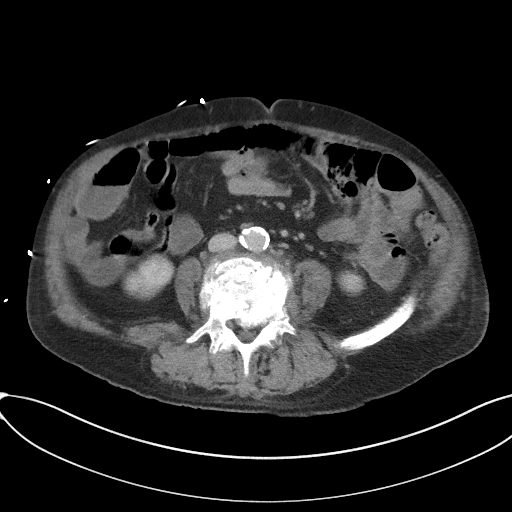
[im 51/76  soft-tissue]
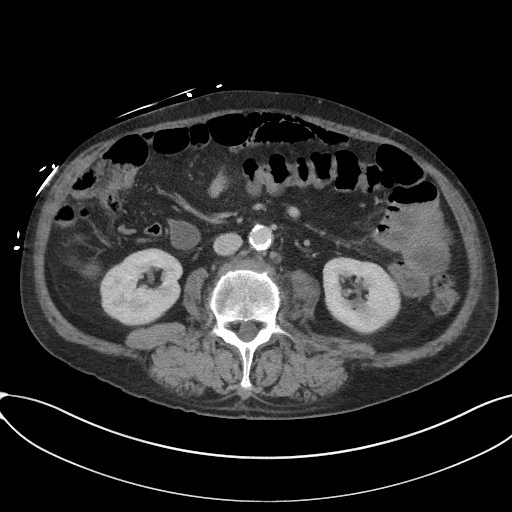
[im 51/76  bone]
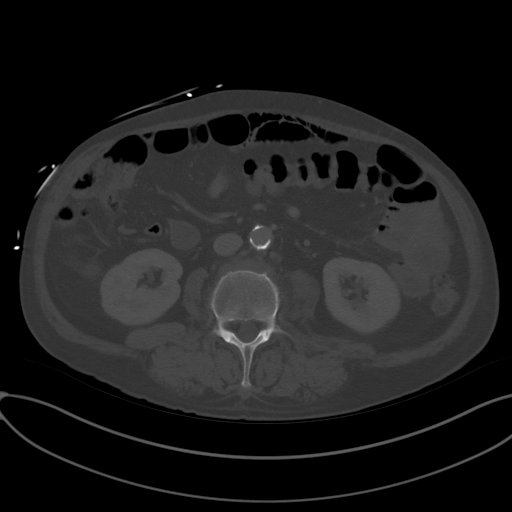
[im 56/76  soft-tissue]
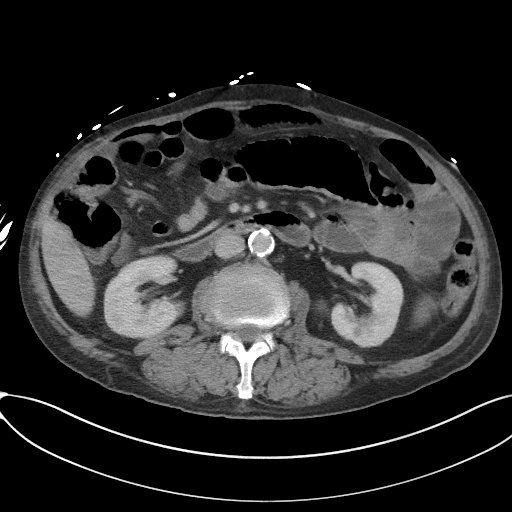
[im 61/76  soft-tissue]
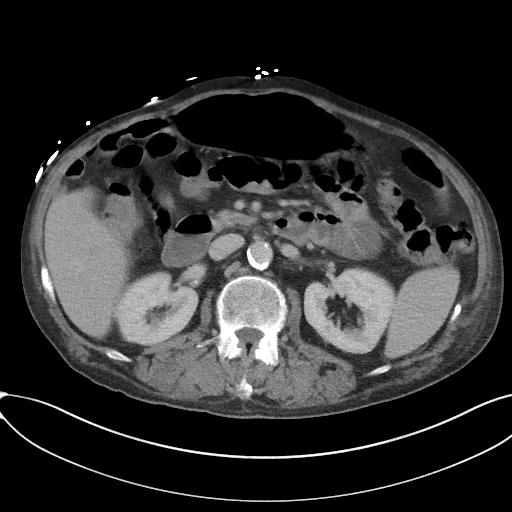
[im 66/76  soft-tissue]
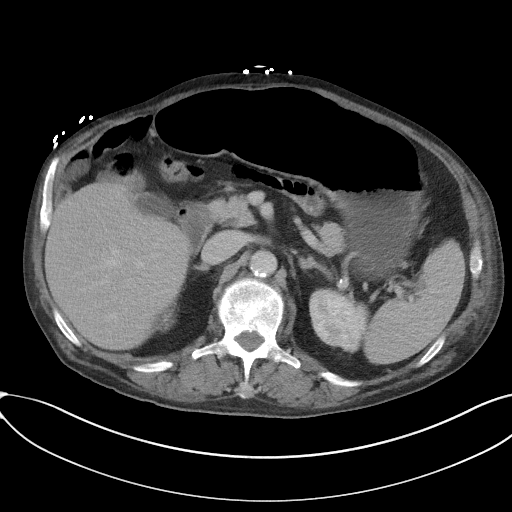
[im 71/76  soft-tissue]
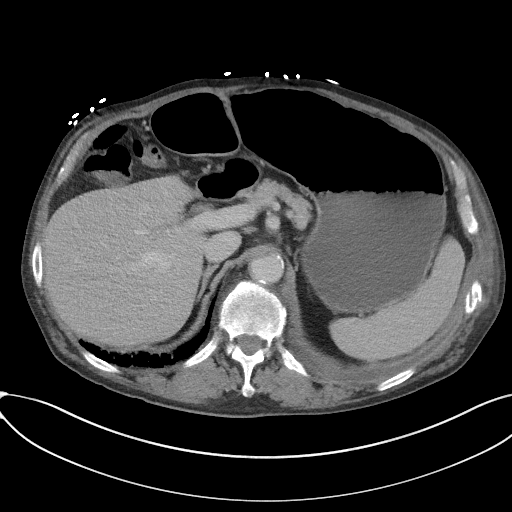

[Series 7: coronal st · coronal · 0.81mm/px · 3 of 93 slices shown]
[im 31/93  soft-tissue]
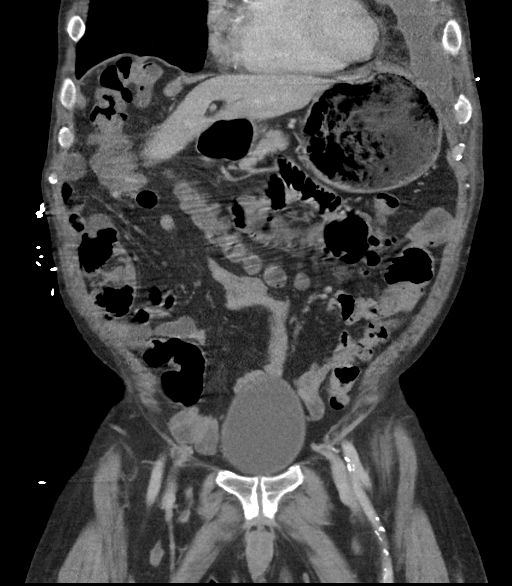
[im 41/93  soft-tissue]
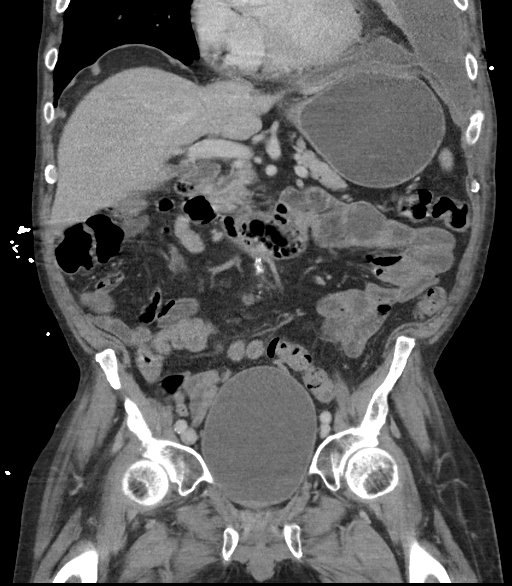
[im 52/93  soft-tissue]
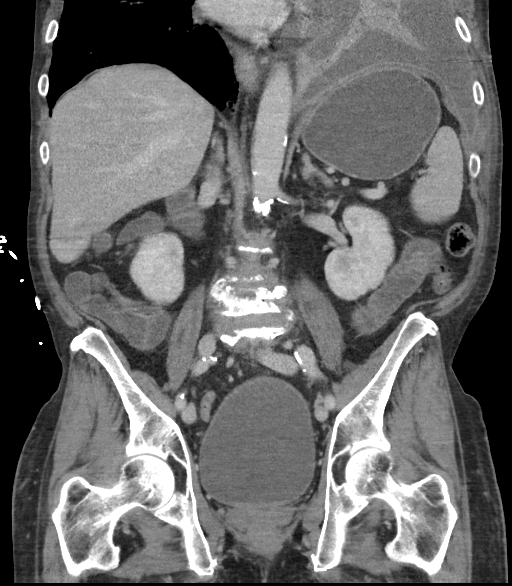

[16 of 46 positions shown; findings below may reference images not displayed]

RADIATION DOSE REDUCTION: This exam was performed according to the
departmental dose-optimization program which includes automated
exposure control, adjustment of the mA and/or kV according to
patient size and/or use of iterative reconstruction technique.

CONTRAST:  75mL OMNIPAQUE IOHEXOL 350 MG/ML SOLN
FINDINGS: Lower chest: Please see separately reported examination of the
chest.

Hepatobiliary: No solid liver abnormality is seen. No gallstones,
gallbladder wall thickening, or biliary dilatation.

Pancreas: Unremarkable. No pancreatic ductal dilatation or
surrounding inflammatory changes.

Spleen: Normal in size without significant abnormality.

Adrenals/Urinary Tract: Adrenal glands are unremarkable. Kidneys are
normal, without renal calculi, solid lesion, or hydronephrosis.
Bladder is unremarkable.

Stomach/Bowel: Stomach is within normal limits. Appendix is not
clearly visualized. No evidence of bowel wall thickening,
distention, or inflammatory changes.

Vascular/Lymphatic: Aortic atherosclerosis. No enlarged abdominal or
pelvic lymph nodes.

Reproductive: No mass or other significant abnormality.

Other: No abdominal wall hernia or abnormality. No ascites.

Musculoskeletal: Unchanged superior endplate wedge deformities of
T12 and L1, with a high-grade vertebral plana deformity of L4
(series 8, image 69).
IMPRESSION: 1. No acute CT findings of the abdomen or pelvis to explain
abdominal pain.
2. Unchanged superior endplate wedge deformities of T12 and L1, with
a high-grade vertebral plana deformity of L4. No new fracture.

Aortic Atherosclerosis ([U5]-[U5]).

## 2021-09-10 IMAGING — CR DG HIP (WITH OR WITHOUT PELVIS) 2-3V*L*
3 series · 3 of 3 positions shown · non-contrast
Comparison: Left hip series [DATE].

CLINICAL DATA: 70-year-old male status post fall. Found down. Pain
and weakness.

EXAM:
DG HIP (WITH OR WITHOUT PELVIS) 2-3V LEFT

[pelvis ap]
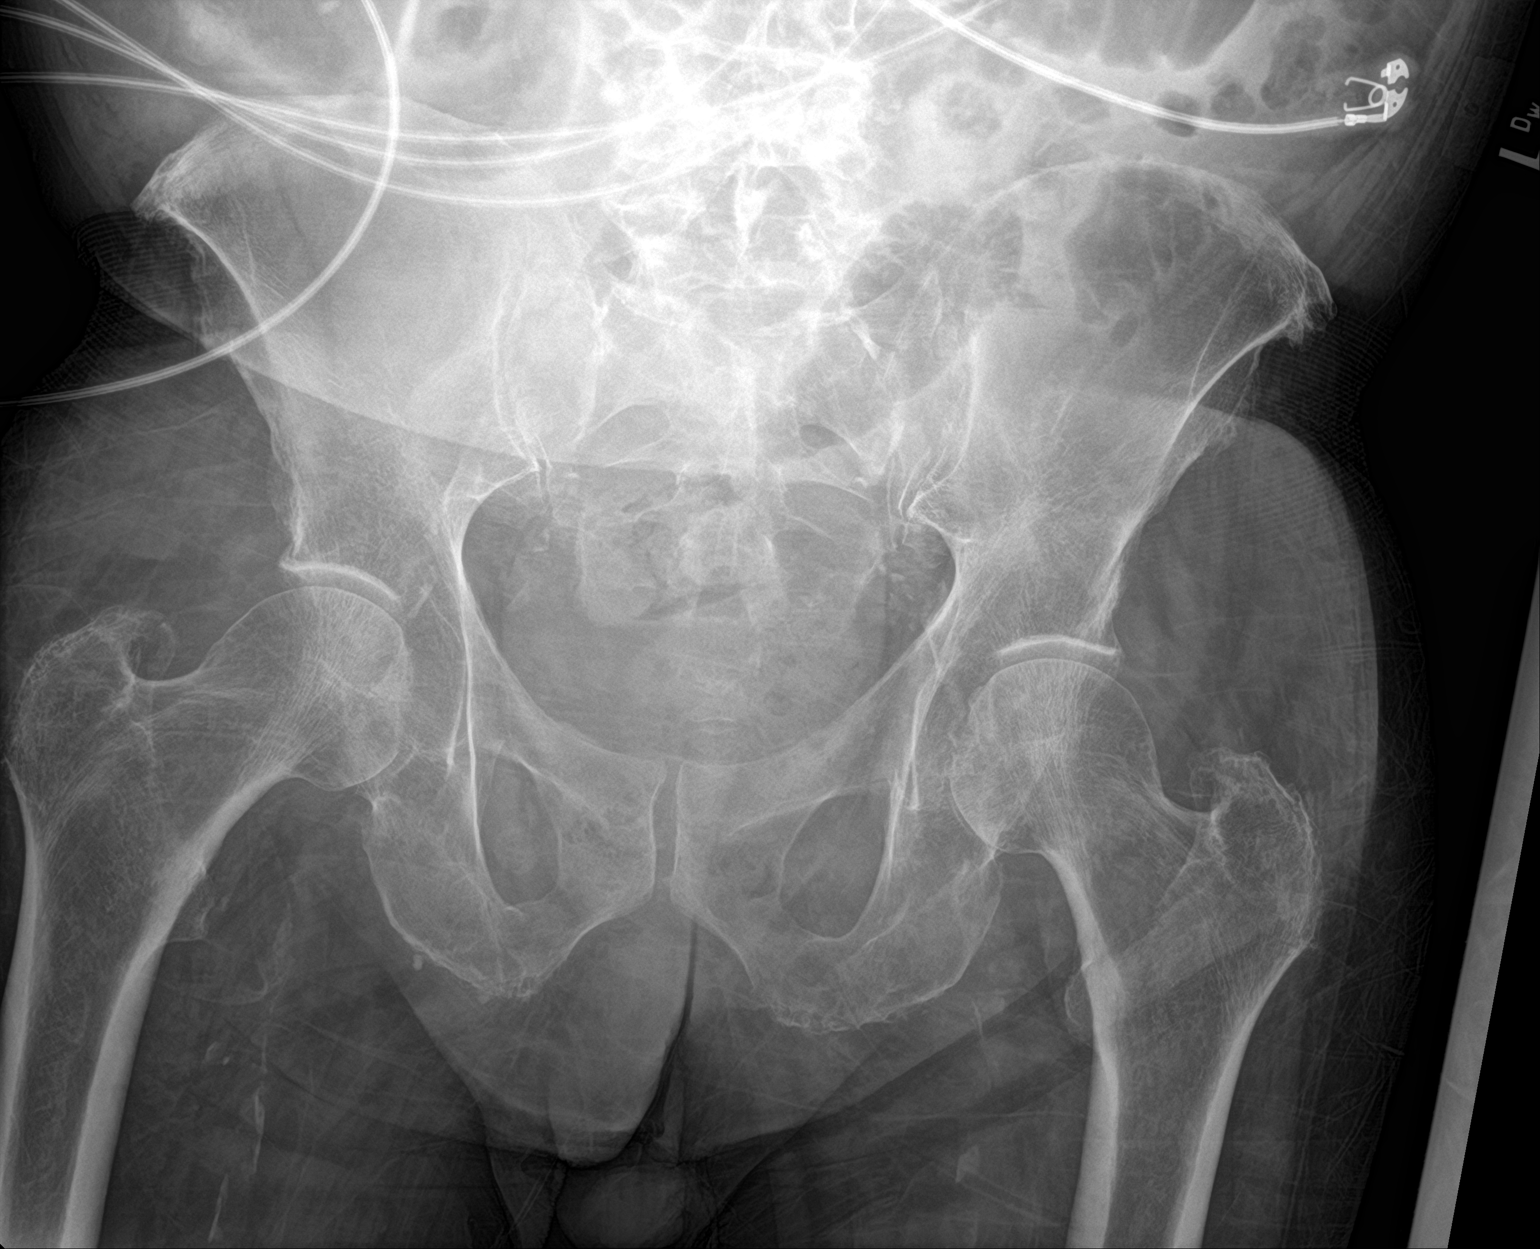

[hip ap]
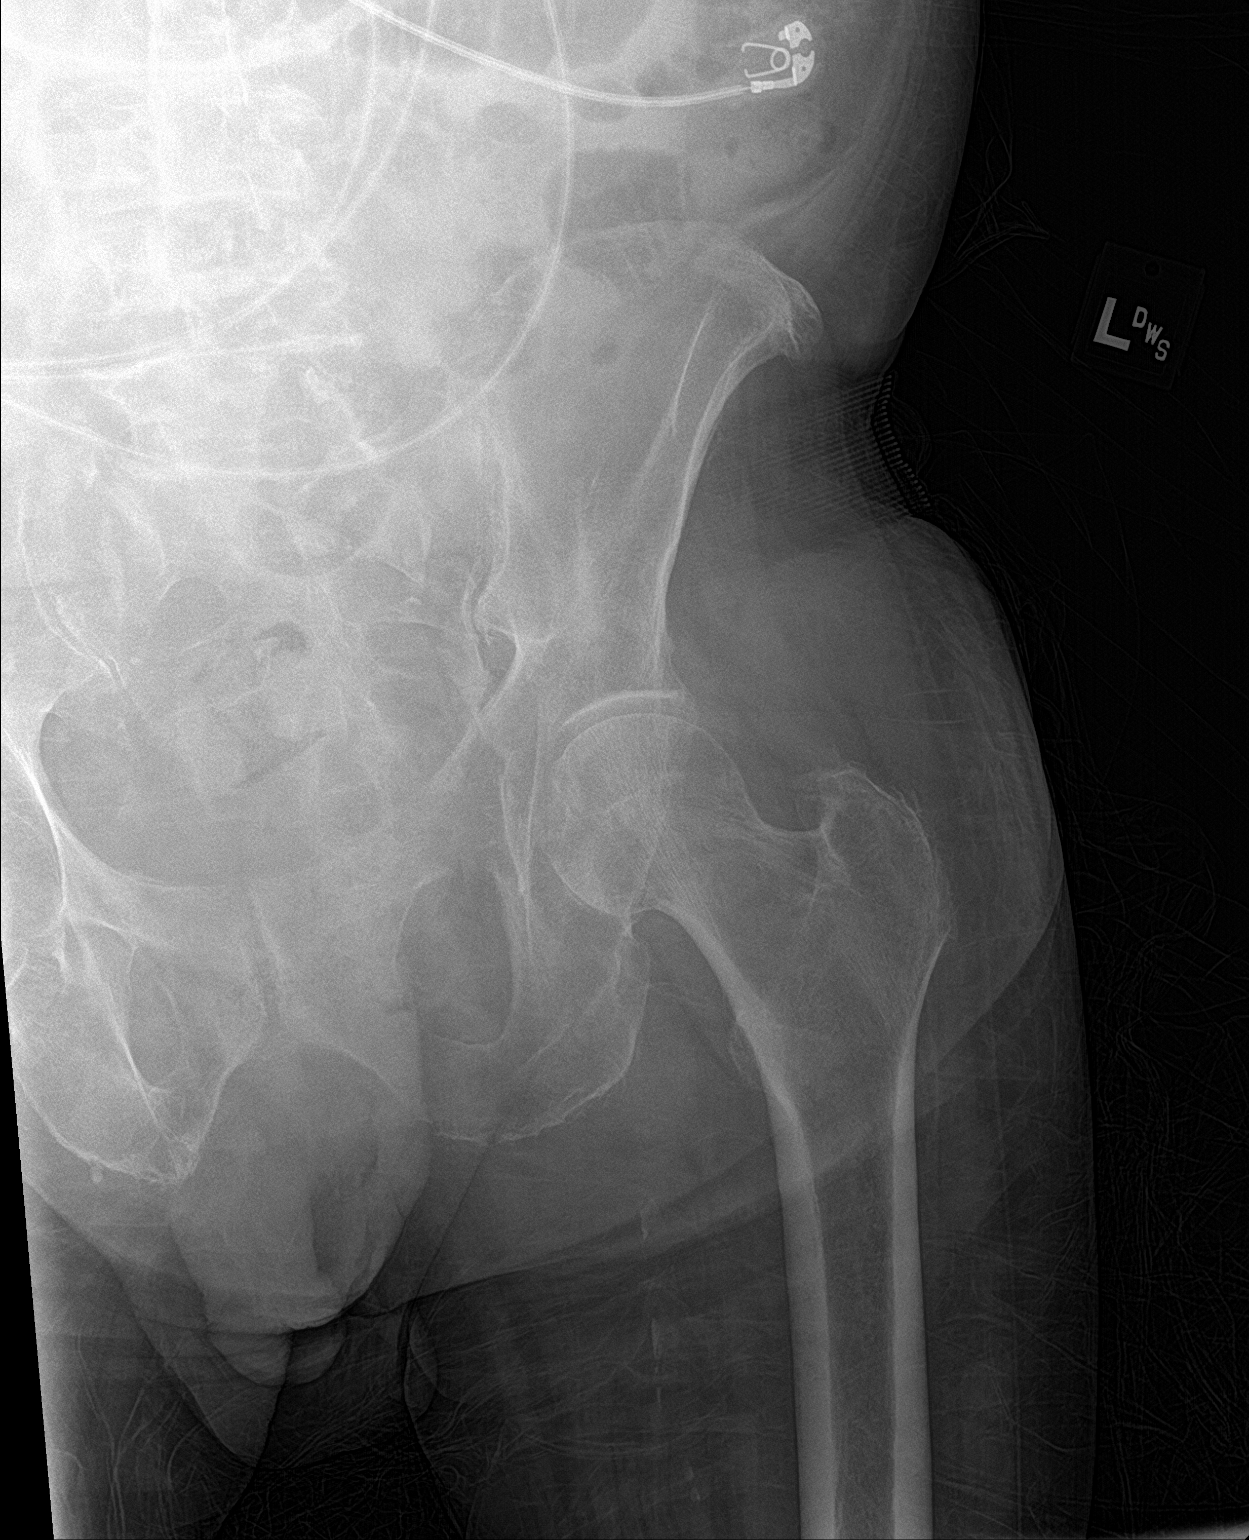

[hip lat]
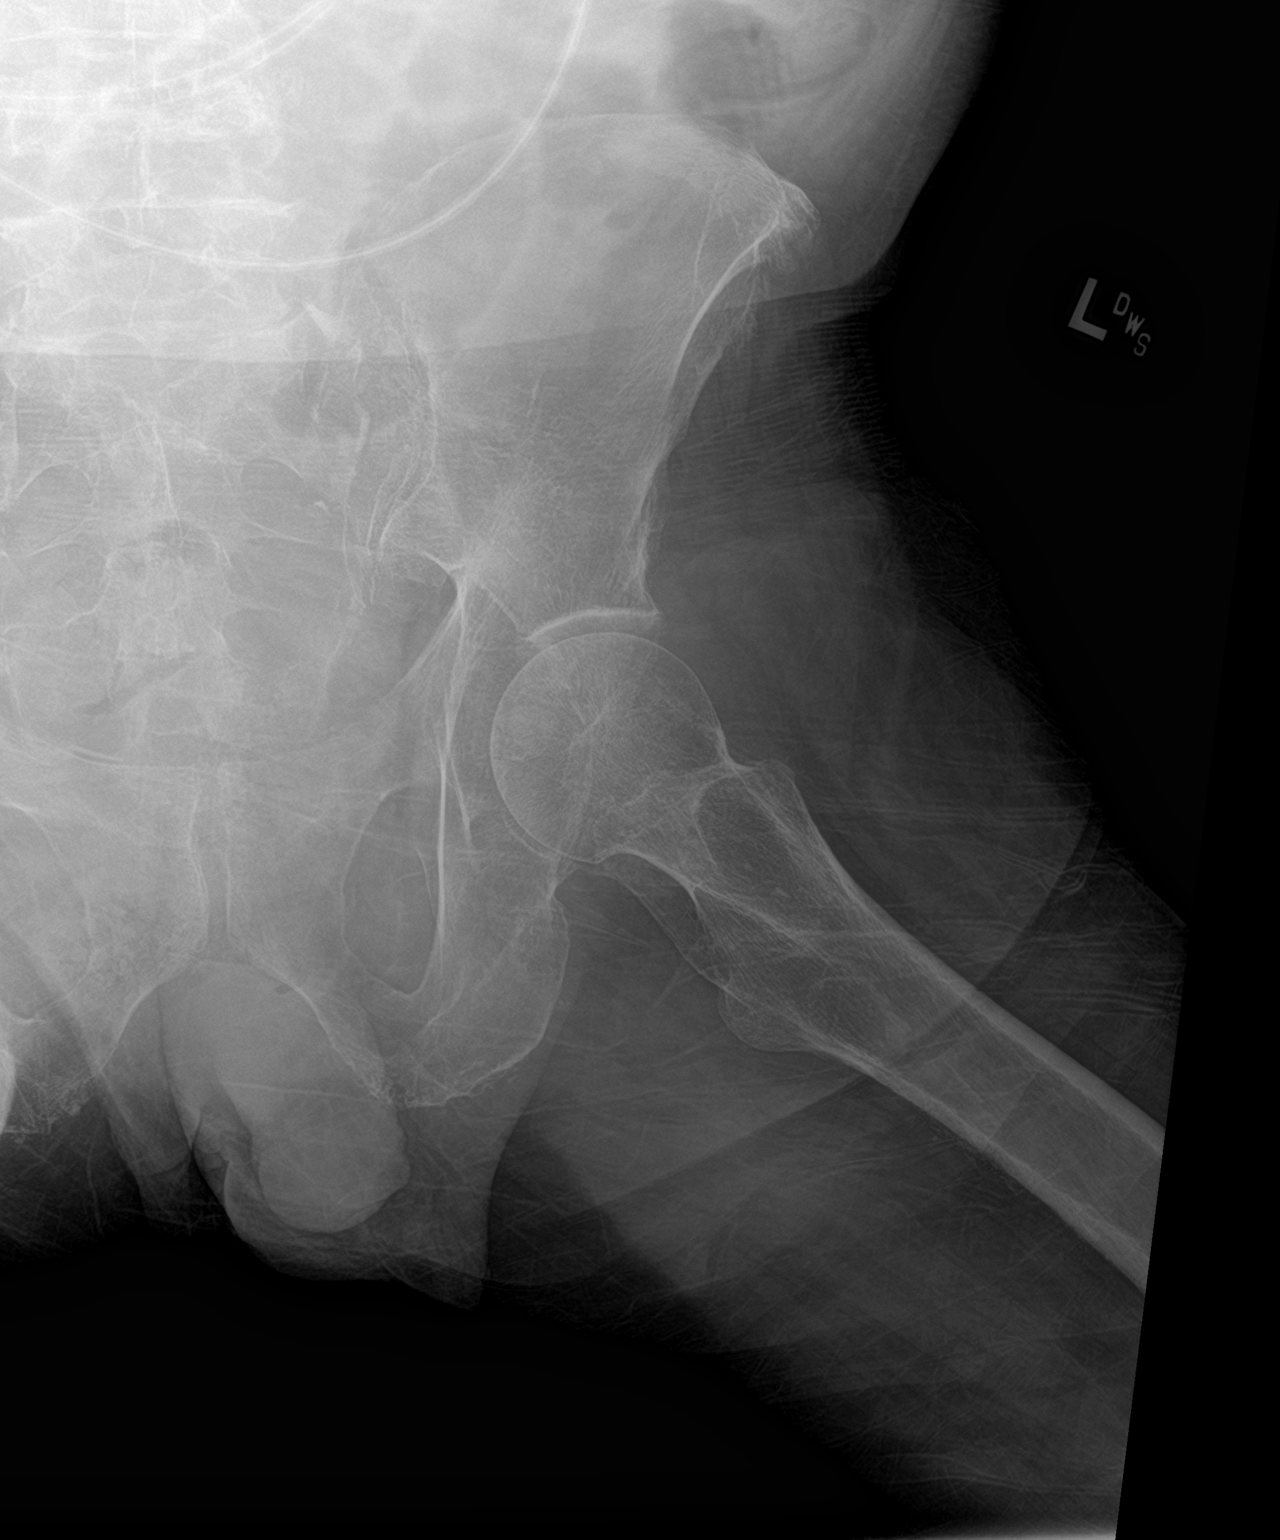

[3 of 3 positions shown; findings below may reference images not displayed]

FINDINGS: Femoral heads remain normally located. Pelvis appears stable and
intact. Grossly intact proximal right femur. Calcified iliofemoral
atherosclerosis. Proximal left femur appears stable and intact. No
acute osseous abnormality identified. Visible bowel-gas pattern
within normal limits.
IMPRESSION: No acute fracture or dislocation identified about the left hip or
pelvis.

## 2021-09-10 IMAGING — CT CT HEAD W/O CM
4 series · 16 of 47 positions shown, 18 images · non-contrast
Comparison: [DATE]

CLINICAL DATA: Unwitnessed fall, found down



[Series 2: head wo · axial · 0.43mm/px · z∈[-90,+30]mm · 7 of 32 slices shown, 9 images]
[im 4/32  brain]
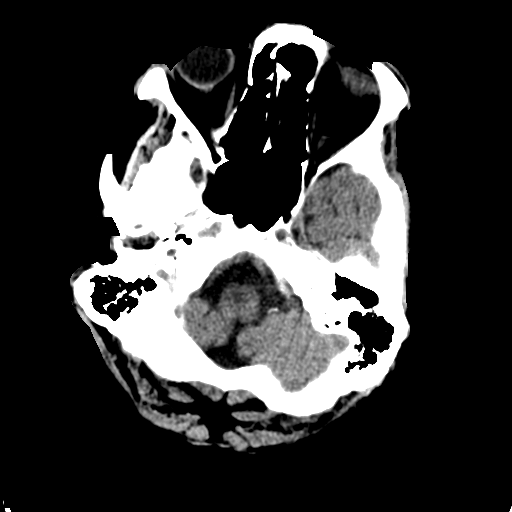
[im 4/32  bone]
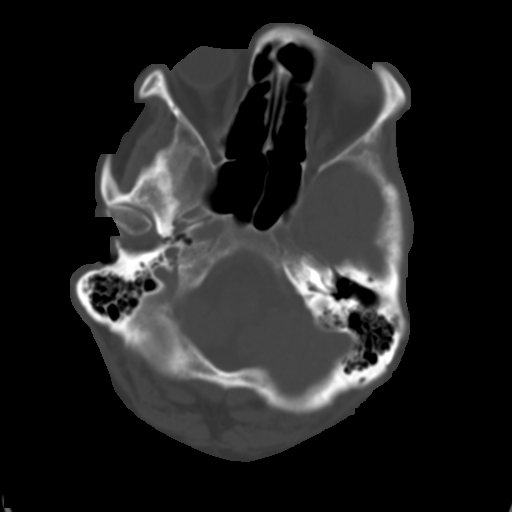
[im 8/32  brain]
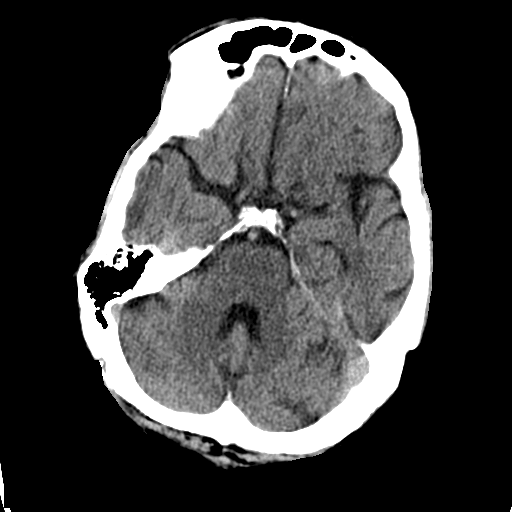
[im 12/32  brain]
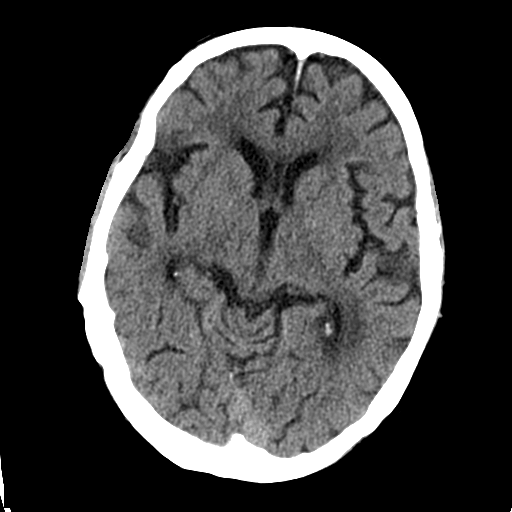
[im 16/32  brain]
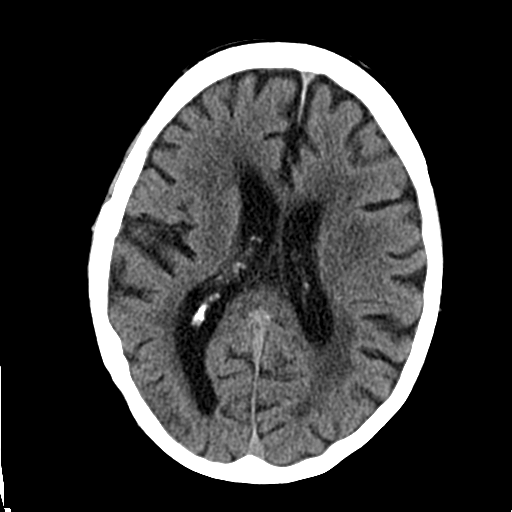
[im 20/32  brain]
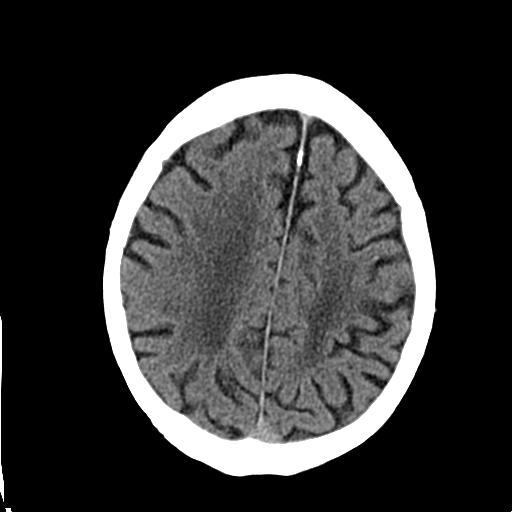
[im 20/32  bone]
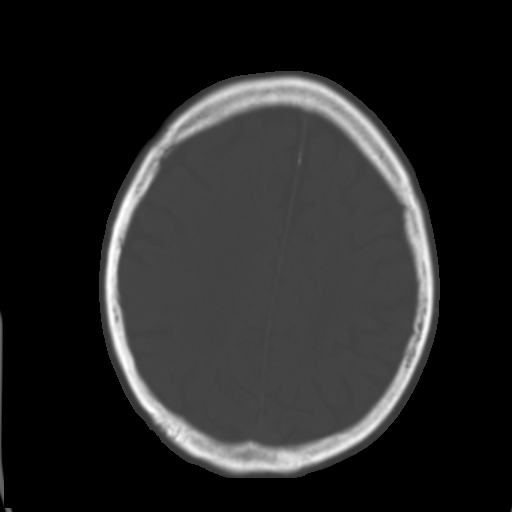
[im 24/32  brain]
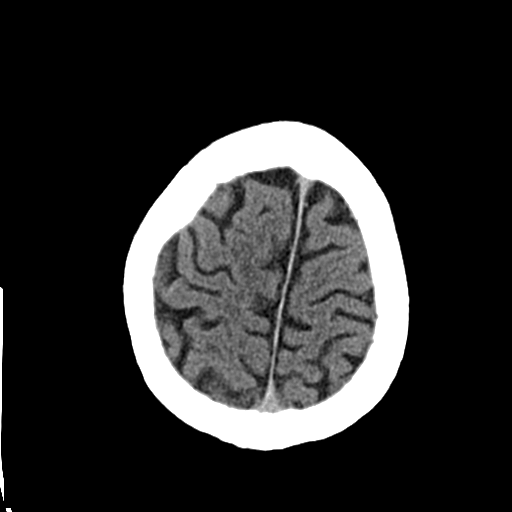
[im 28/32  brain]
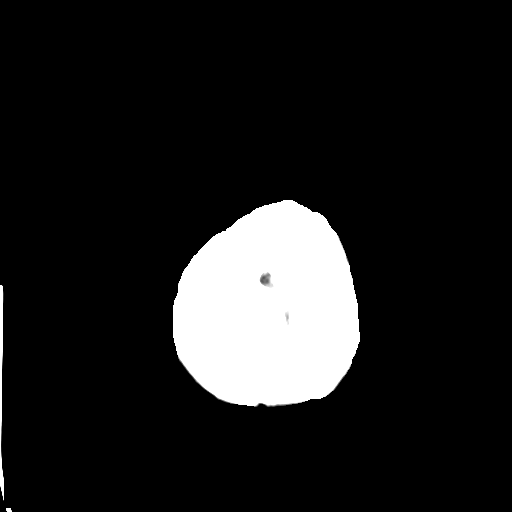

[Series 3: head bone · axial · 0.43mm/px · z∈[-91,-59]mm · 3 of 80 slices shown]
[im 8/80  bone]
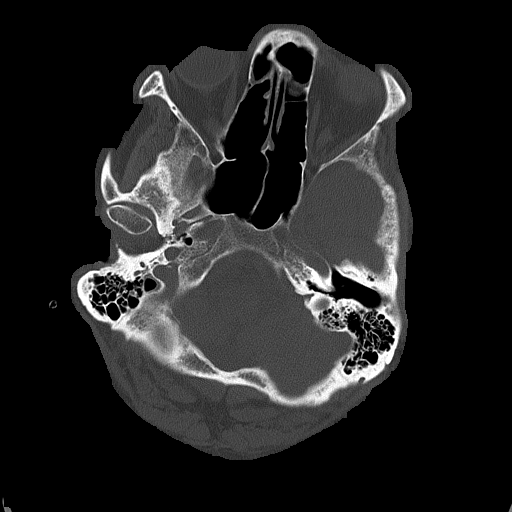
[im 16/80  bone]
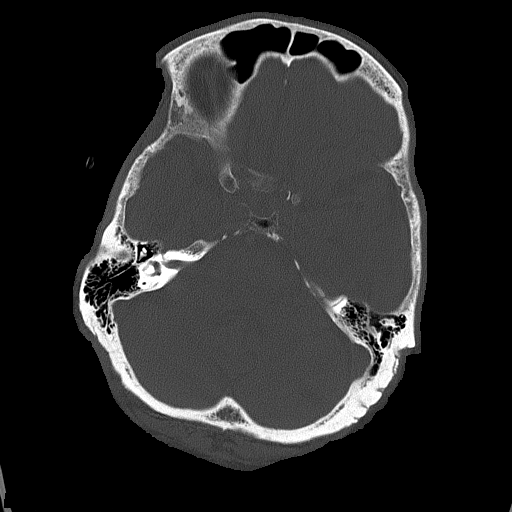
[im 24/80  bone]
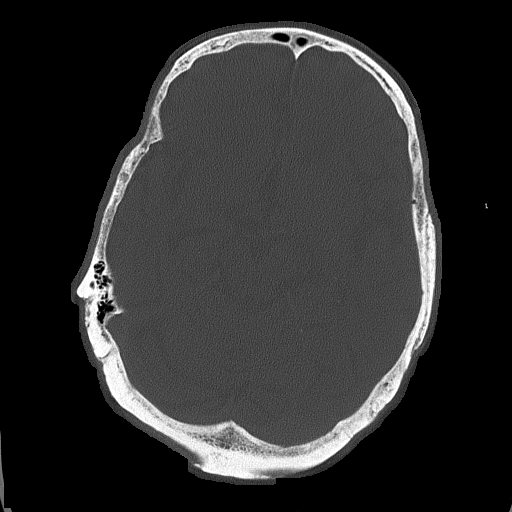

[Series 4: coronal soft tissue · coronal · 0.32mm/px · 3 of 68 slices shown]
[im 23/68  brain]
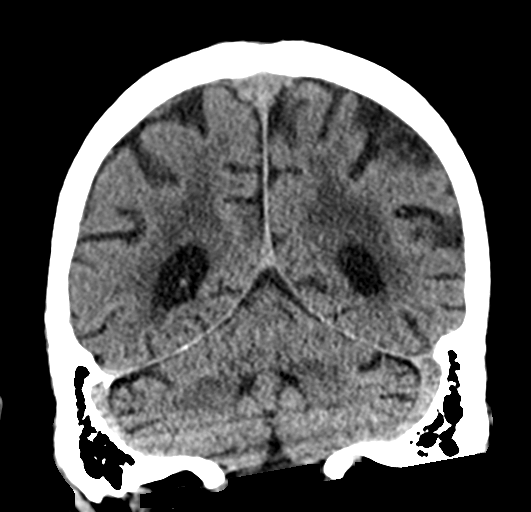
[im 30/68  brain]
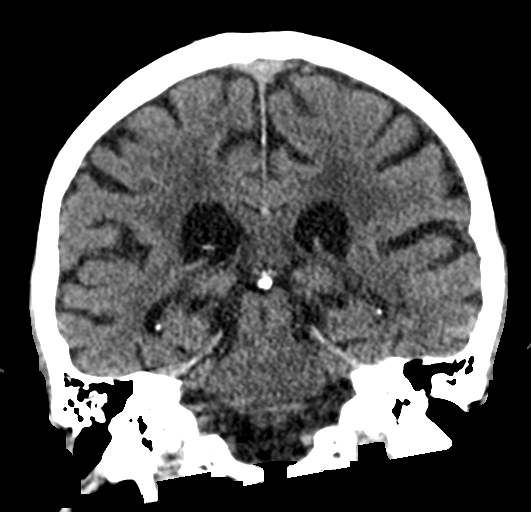
[im 38/68  brain]
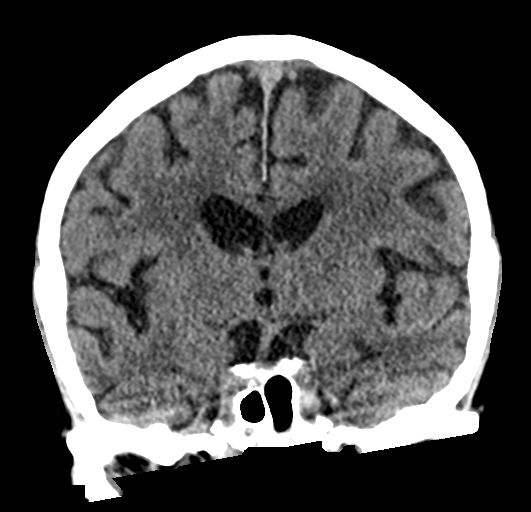

[Series 5: sagittal soft tissue · sagittal · 0.32mm/px · 3 of 56 slices shown]
[im 19/56  brain]
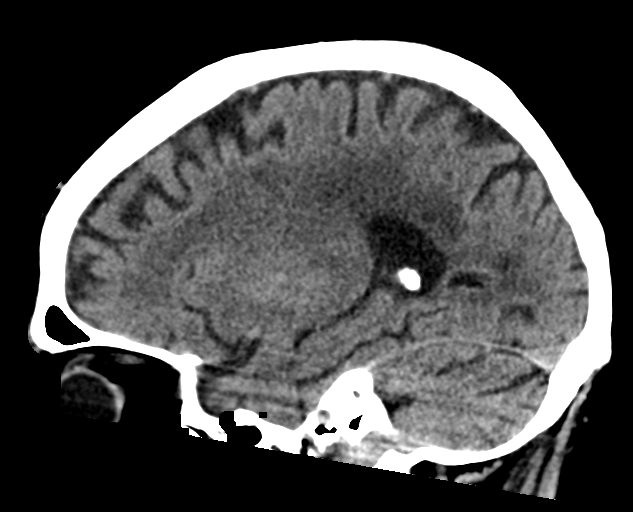
[im 28/56  brain]
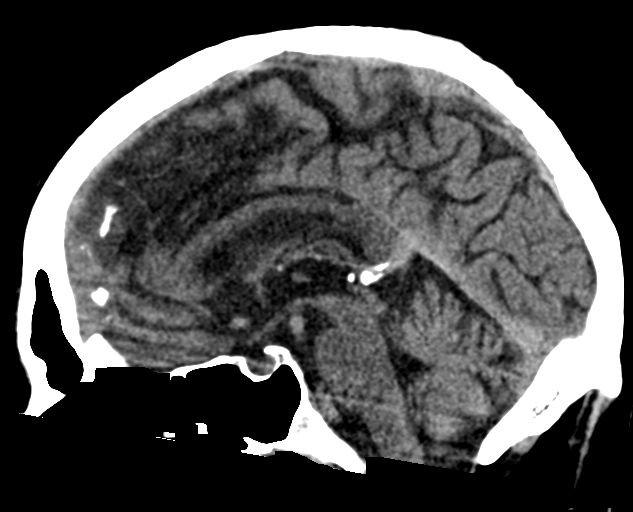
[im 37/56  brain]
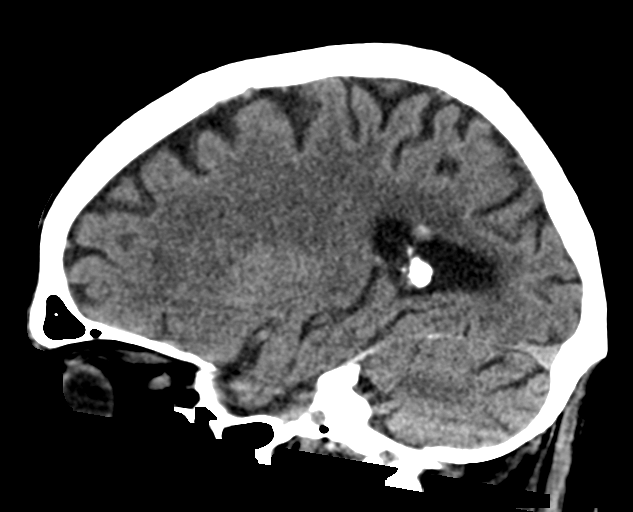

[16 of 47 positions shown; findings below may reference images not displayed]

FINDINGS: CT HEAD FINDINGS

Brain: No definite evidence of acute infarction, hemorrhage,
hydrocephalus, extra-axial collection or mass lesion/mass effect.
Unchanged hyperdense subependymal nodule of the posterior right
parietal lobe measuring no greater than 0.5 cm (series 2, image 17).
Periventricular and deep white matter hypodensity.

Vascular: No hyperdense vessel or unexpected calcification.

Skull: Normal. Negative for fracture or focal lesion.

Sinuses/Orbits: No acute finding.

Other: None.

CT CERVICAL SPINE FINDINGS

Alignment: Normal.

Skull base and vertebrae: No acute fracture. No primary bone lesion
or focal pathologic process.

Soft tissues and spinal canal: No prevertebral fluid or swelling. No
visible canal hematoma.

Disc levels: Focally moderate disc space height loss and
osteophytosis of C5-C6 and mild disc space height loss and
osteophytosis of C6-C7, with otherwise preserved disc spaces.

Upper chest: Please see separately reported examination of the
chest.

Other: None.
IMPRESSION: 1. No acute intracranial pathology. Small-vessel white matter
disease.
2. Unchanged hyperdense subependymal nodule of the posterior right
parietal lobe measuring no greater than 0.5 cm. Differential
considerations again include a small hematoma, hamartoma, or
cavernoma and as previously reported, could be further evaluated by
contrast enhanced MRI.
3. No fracture or static subluxation of the cervical spine.

## 2021-09-10 IMAGING — CT CT CERVICAL SPINE W/O CM
3 of 4 series · 12 of 33 positions shown, 14 images · non-contrast
Comparison: [DATE]

CLINICAL DATA: Unwitnessed fall, found down



[Series 4: sagittal bone · sagittal · 0.28mm/px · 5 of 65 slices shown, 6 images]
[im 22/65  bone]
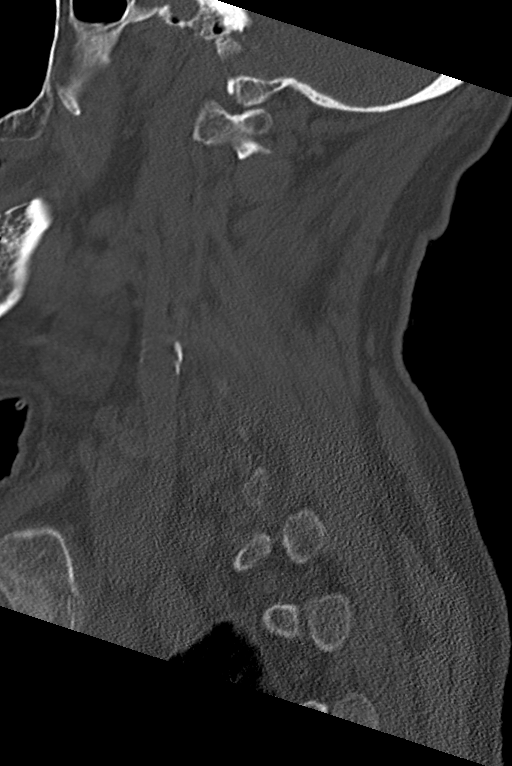
[im 27/65  bone]
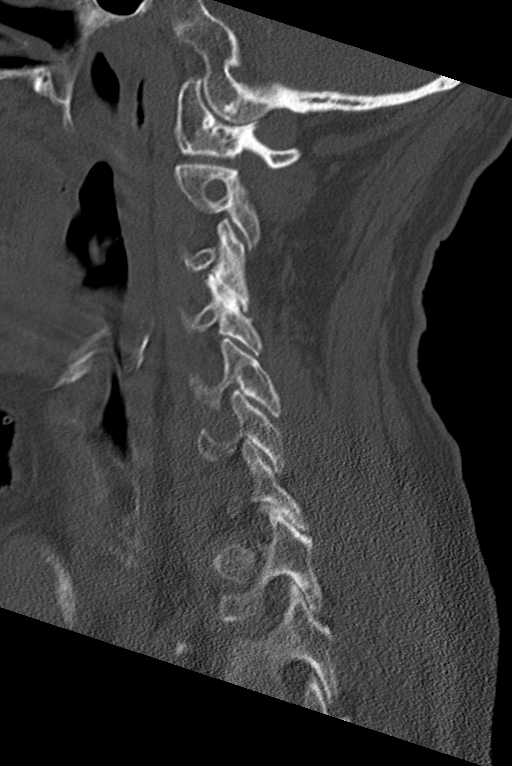
[im 33/65  soft-tissue]
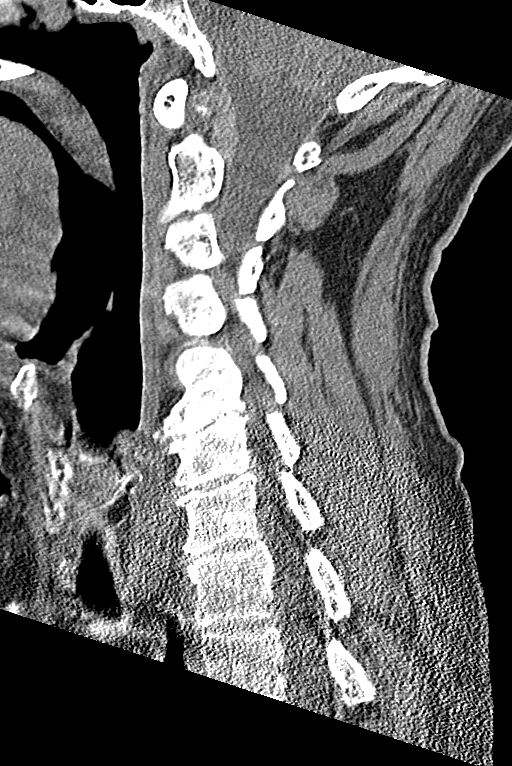
[im 33/65  bone]
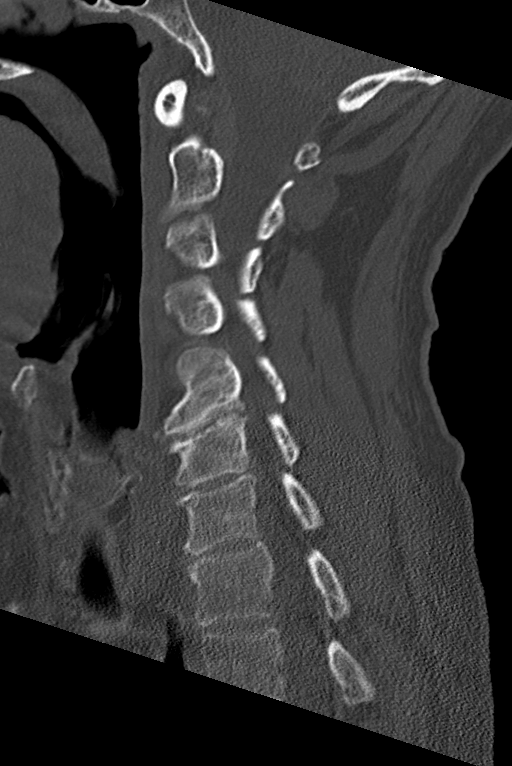
[im 38/65  bone]
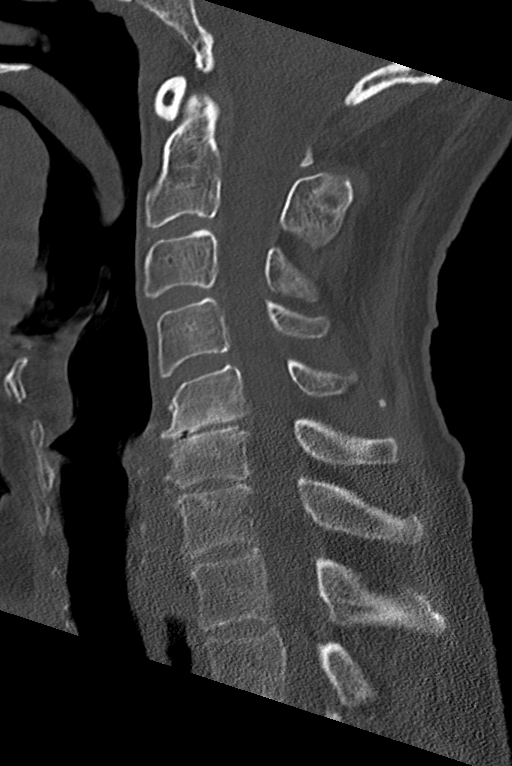
[im 43/65  bone]
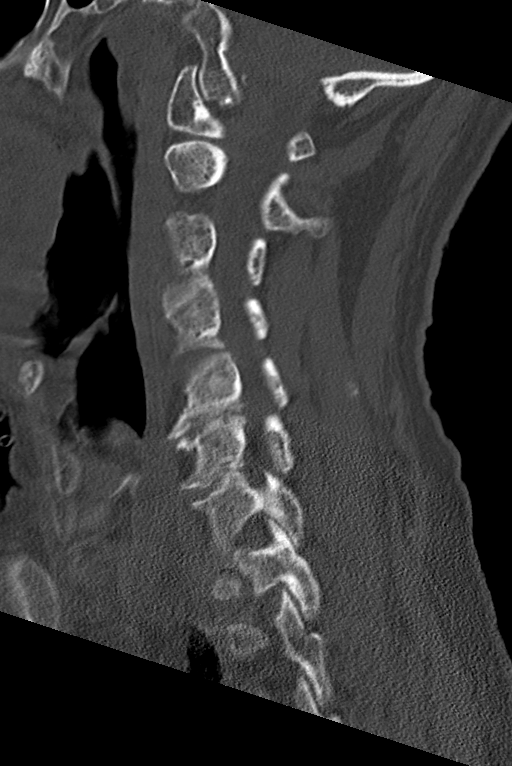

[Series 8: coronal bone · coronal · 0.25mm/px · 3 of 59 slices shown]
[im 12/59  bone]
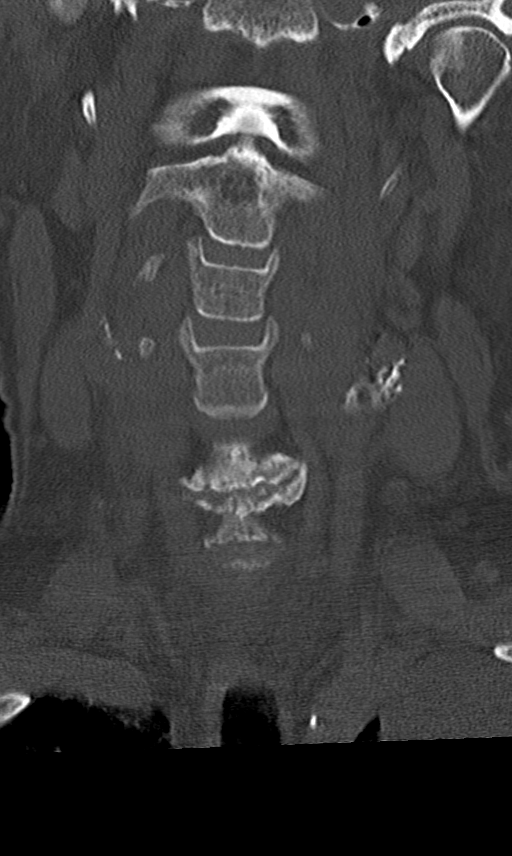
[im 24/59  bone]
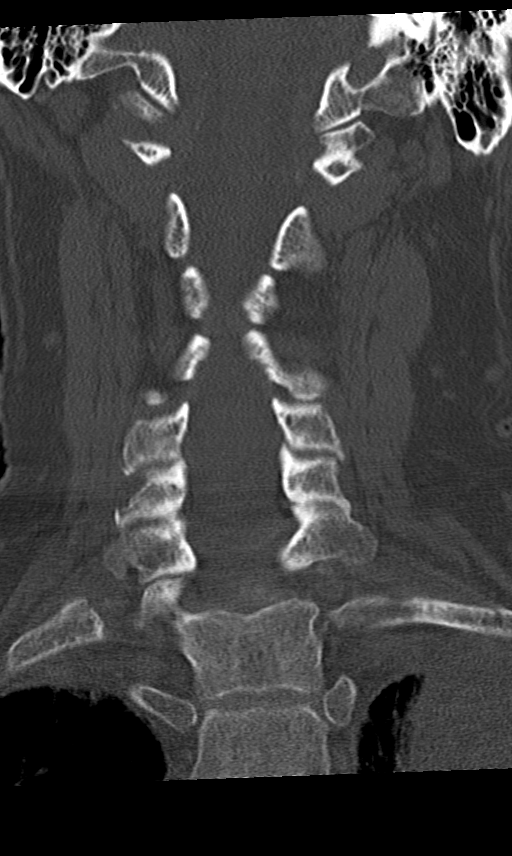
[im 35/59  bone]
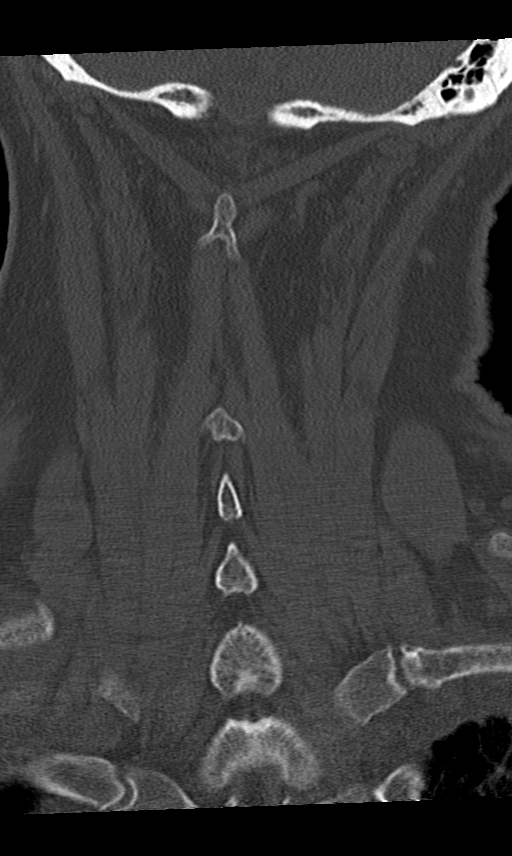

[Series 9: orthogonal bone · axial · 0.27mm/px · z∈[-301,-137]mm · 4 of 122 slices shown, 5 images]
[im 18/122  soft-tissue]
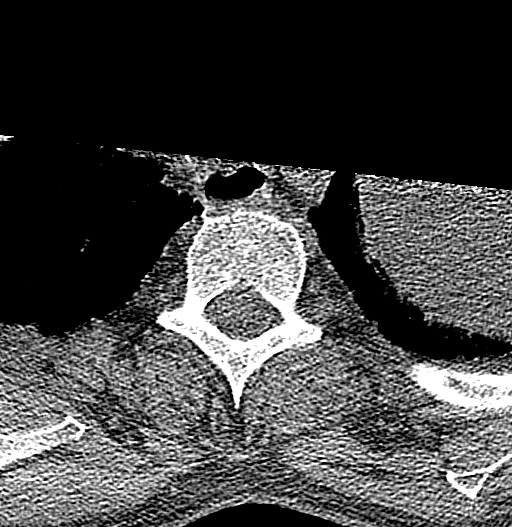
[im 18/122  bone]
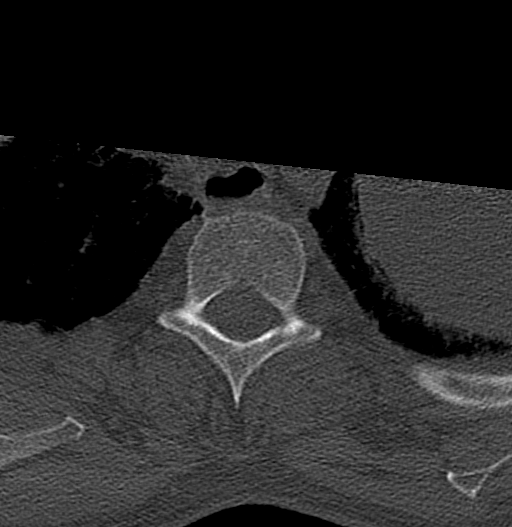
[im 52/122  bone]
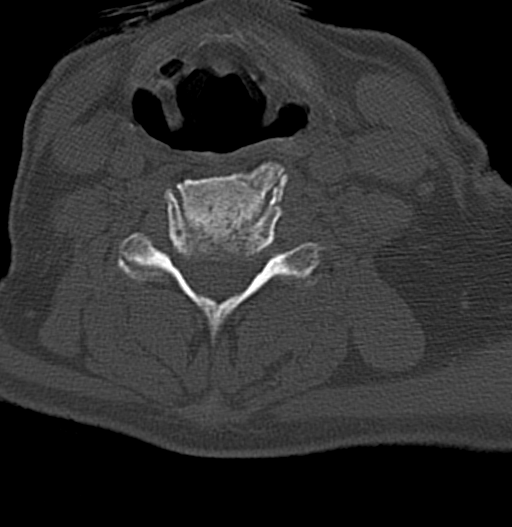
[im 70/122  bone]
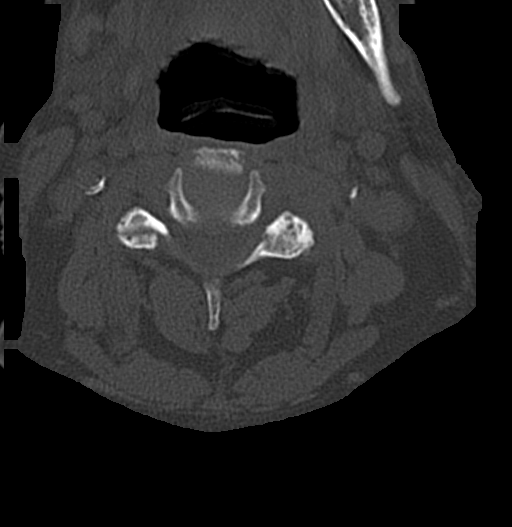
[im 104/122  bone]
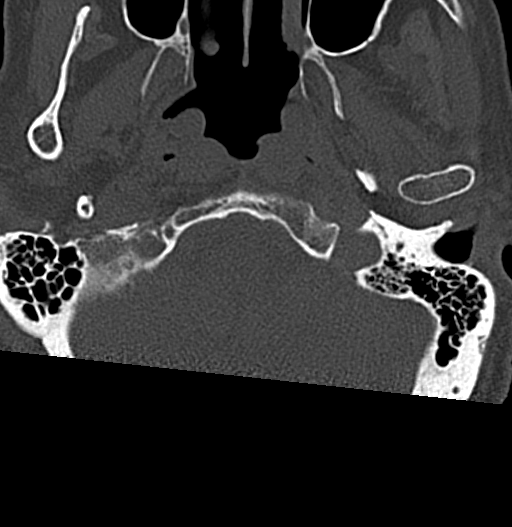

[12 of 33 positions shown; findings below may reference images not displayed]

FINDINGS: CT HEAD FINDINGS

Brain: No definite evidence of acute infarction, hemorrhage,
hydrocephalus, extra-axial collection or mass lesion/mass effect.
Unchanged hyperdense subependymal nodule of the posterior right
parietal lobe measuring no greater than 0.5 cm (series 2, image 17).
Periventricular and deep white matter hypodensity.

Vascular: No hyperdense vessel or unexpected calcification.

Skull: Normal. Negative for fracture or focal lesion.

Sinuses/Orbits: No acute finding.

Other: None.

CT CERVICAL SPINE FINDINGS

Alignment: Normal.

Skull base and vertebrae: No acute fracture. No primary bone lesion
or focal pathologic process.

Soft tissues and spinal canal: No prevertebral fluid or swelling. No
visible canal hematoma.

Disc levels: Focally moderate disc space height loss and
osteophytosis of C5-C6 and mild disc space height loss and
osteophytosis of C6-C7, with otherwise preserved disc spaces.

Upper chest: Please see separately reported examination of the
chest.

Other: None.
IMPRESSION: 1. No acute intracranial pathology. Small-vessel white matter
disease.
2. Unchanged hyperdense subependymal nodule of the posterior right
parietal lobe measuring no greater than 0.5 cm. Differential
considerations again include a small hematoma, hamartoma, or
cavernoma and as previously reported, could be further evaluated by
contrast enhanced MRI.
3. No fracture or static subluxation of the cervical spine.

## 2021-09-10 MED ORDER — IPRATROPIUM-ALBUTEROL 0.5-2.5 (3) MG/3ML IN SOLN: 3.0000 mL | RESPIRATORY_TRACT | Status: DC | PRN

## 2021-09-10 MED ORDER — VANCOMYCIN HCL IN DEXTROSE 1-5 GM/200ML-% IV SOLN
1000.0000 mg | Freq: Once | INTRAVENOUS | Status: AC
  Administered 2021-09-10: 1000 mg via INTRAVENOUS
  Filled 2021-09-10: qty 200

## 2021-09-10 MED ORDER — POLYETHYLENE GLYCOL 3350 17 G PO PACK
17.0000 g | PACK | Freq: Every day | ORAL | Status: DC
  Administered 2021-09-10: 17 g via ORAL
  Filled 2021-09-10: qty 1

## 2021-09-10 MED ORDER — THIAMINE HCL 100 MG PO TABS
100.0000 mg | ORAL_TABLET | Freq: Every day | ORAL | Status: DC
  Administered 2021-09-10: 100 mg via ORAL
  Filled 2021-09-10: qty 1

## 2021-09-10 MED ORDER — FOLIC ACID 1 MG PO TABS
1.0000 mg | ORAL_TABLET | Freq: Every day | ORAL | Status: DC
  Administered 2021-09-10: 1 mg via ORAL
  Filled 2021-09-10: qty 1

## 2021-09-10 MED ORDER — THIAMINE HCL 100 MG/ML IJ SOLN
100.0000 mg | Freq: Every day | INTRAMUSCULAR | Status: DC
  Filled 2021-09-10: qty 2

## 2021-09-10 MED ORDER — METRONIDAZOLE 500 MG/100ML IV SOLN
500.0000 mg | Freq: Three times a day (TID) | INTRAVENOUS | Status: DC
  Administered 2021-09-10 – 2021-09-11 (×2): 500 mg via INTRAVENOUS
  Filled 2021-09-10 (×2): qty 100

## 2021-09-10 MED ORDER — LORAZEPAM 1 MG PO TABS
0.0000 mg | ORAL_TABLET | Freq: Four times a day (QID) | ORAL | Status: DC
  Administered 2021-09-10: 1 mg via ORAL
  Filled 2021-09-10: qty 1

## 2021-09-10 MED ORDER — VANCOMYCIN HCL 1250 MG/250ML IV SOLN
1250.0000 mg | Freq: Two times a day (BID) | INTRAVENOUS | Status: DC
  Administered 2021-09-11 – 2021-09-12 (×4): 1250 mg via INTRAVENOUS
  Filled 2021-09-10 (×6): qty 250

## 2021-09-10 MED ORDER — SODIUM CHLORIDE 1 G PO TABS
1.0000 g | ORAL_TABLET | Freq: Two times a day (BID) | ORAL | Status: DC
Start: 2021-09-10 — End: 2021-09-11
  Administered 2021-09-10: 1 g via ORAL
  Filled 2021-09-10 (×3): qty 1

## 2021-09-10 MED ORDER — DOCUSATE SODIUM 100 MG PO CAPS: 100.0000 mg | ORAL_CAPSULE | Freq: Two times a day (BID) | ORAL | Status: DC

## 2021-09-10 MED ORDER — ADULT MULTIVITAMIN W/MINERALS CH
1.0000 | ORAL_TABLET | Freq: Every day | ORAL | Status: DC
  Administered 2021-09-10: 1 via ORAL
  Filled 2021-09-10: qty 1

## 2021-09-10 MED ORDER — ONDANSETRON HCL 4 MG/2ML IJ SOLN
4.0000 mg | Freq: Once | INTRAMUSCULAR | Status: AC
  Administered 2021-09-10: 4 mg via INTRAVENOUS
  Filled 2021-09-10: qty 2

## 2021-09-10 MED ORDER — FENTANYL CITRATE PF 50 MCG/ML IJ SOSY
50.0000 ug | PREFILLED_SYRINGE | Freq: Once | INTRAMUSCULAR | Status: AC
  Administered 2021-09-10: 50 ug via INTRAVENOUS
  Filled 2021-09-10: qty 1

## 2021-09-10 MED ORDER — ACETAMINOPHEN 325 MG PO TABS: 650.0000 mg | ORAL_TABLET | Freq: Four times a day (QID) | ORAL | Status: DC | PRN

## 2021-09-10 MED ORDER — CLONAZEPAM 0.5 MG PO TABS
0.5000 mg | ORAL_TABLET | Freq: Two times a day (BID) | ORAL | Status: DC | PRN
Start: 2021-09-10 — End: 2021-09-11

## 2021-09-10 MED ORDER — SODIUM CHLORIDE 0.9 % IV BOLUS
1000.0000 mL | Freq: Once | INTRAVENOUS | Status: AC
  Administered 2021-09-10: 1000 mL via INTRAVENOUS

## 2021-09-10 MED ORDER — IOHEXOL 350 MG/ML SOLN
75.0000 mL | Freq: Once | INTRAVENOUS | Status: AC | PRN
  Administered 2021-09-10: 75 mL via INTRAVENOUS

## 2021-09-10 MED ORDER — ALBUTEROL SULFATE (2.5 MG/3ML) 0.083% IN NEBU: 2.5000 mg | INHALATION_SOLUTION | RESPIRATORY_TRACT | Status: DC | PRN

## 2021-09-10 MED ORDER — HEPARIN SODIUM (PORCINE) 5000 UNIT/ML IJ SOLN
5000.0000 [IU] | Freq: Three times a day (TID) | INTRAMUSCULAR | Status: DC
  Administered 2021-09-10 – 2021-09-26 (×34): 5000 [IU] via SUBCUTANEOUS
  Filled 2021-09-10 (×41): qty 1

## 2021-09-10 MED ORDER — METRONIDAZOLE 500 MG/100ML IV SOLN
500.0000 mg | Freq: Once | INTRAVENOUS | Status: AC
  Administered 2021-09-10: 500 mg via INTRAVENOUS
  Filled 2021-09-10: qty 100

## 2021-09-10 MED ORDER — SODIUM CHLORIDE 0.9 % IV SOLN
2.0000 g | Freq: Three times a day (TID) | INTRAVENOUS | Status: DC
  Administered 2021-09-10 – 2021-09-11 (×2): 2 g via INTRAVENOUS
  Filled 2021-09-10 (×3): qty 2

## 2021-09-10 MED ORDER — ORAL CARE MOUTH RINSE
15.0000 mL | Freq: Two times a day (BID) | OROMUCOSAL | Status: DC
  Administered 2021-09-10: 15 mL via OROMUCOSAL

## 2021-09-10 MED ORDER — LORAZEPAM 1 MG PO TABS: 0.0000 mg | ORAL_TABLET | Freq: Two times a day (BID) | ORAL | Status: DC

## 2021-09-10 MED ORDER — SODIUM CHLORIDE 3 % IV SOLN
INTRAVENOUS | Status: DC
  Filled 2021-09-10 (×2): qty 500

## 2021-09-10 MED ORDER — SODIUM CHLORIDE 0.9 % IV SOLN
2.0000 g | Freq: Once | INTRAVENOUS | Status: AC
  Administered 2021-09-10: 2 g via INTRAVENOUS
  Filled 2021-09-10: qty 2

## 2021-09-10 MED ORDER — PANTOPRAZOLE SODIUM 40 MG PO TBEC
40.0000 mg | DELAYED_RELEASE_TABLET | Freq: Every day | ORAL | Status: DC
  Administered 2021-09-10: 40 mg via ORAL
  Filled 2021-09-10: qty 1

## 2021-09-10 MED ORDER — OXYCODONE HCL 5 MG PO TABS
5.0000 mg | ORAL_TABLET | ORAL | Status: DC | PRN
  Administered 2021-09-10 (×2): 5 mg via ORAL
  Filled 2021-09-10 (×2): qty 1

## 2021-09-10 NOTE — ED Triage Notes (Signed)
Pt arrives via EMS from home for CP, weakness, and L hip pain- pt has been having CP on and off for 2-3 weeks-pt had a fall yesterday and was on the floor for about 12 hours- pt was 86% on RA, BP WNL, pt tachycardic at 110's, 138 cbg- pt has audible gurgaling when he breaths

## 2021-09-10 NOTE — ED Notes (Signed)
Pt too weak to sign consent.

## 2021-09-10 NOTE — ED Notes (Signed)
Called [facility] spoke to Lance House for Hospitalist  [phone]

## 2021-09-10 NOTE — ED Notes (Signed)
Pt taken for CT 

## 2021-09-10 NOTE — Plan of Care (Signed)

## 2021-09-10 NOTE — ED Notes (Signed)
EMTALA reviewed by this RN.  

## 2021-09-10 NOTE — H&P (View-Only) (Signed)
Reason for Consult:Left empyema  Referring Physician: Dr. Carlis Abbott CCM  Lance House is an 71 y.o. male.  HPI: 71 yo man admitted with SOB and sepsis found to have large left pleural effusion.  PMH of tobacco abuse, ethanol abuse, gout.  Recent admission at [facility] after fall while intoxicated. Hospitalized from 1/24-2/2. for Compression fractures and ICH.  Dc'ed to SNF but back in ED within 24 hours with worsening pain. Then stayed in ED for 9 days. Today, short of breath and weak. In [facility] ED noted to be hypoxic. CT showed a large left pleural effusion. Transferred to [facility] for further management.  Currently on NB with sats in 94-96 range. C/o left flank/ chest pain. Has been started on broad spectrum antibiotics.    Past Medical History:  Diagnosis Date   Anxiety    Depression    Gout     Past Surgical History:  Procedure Laterality Date   APPENDECTOMY  1994   INGUINAL HERNIA REPAIR Left 04/13/2019   Procedure: HERNIA REPAIR INGUINAL ADULT OPEN, LEFT;  Surgeon: Fredirick Maudlin, MD;  Location: [facility] ORS;  Service: General;  Laterality: Left;   NASAL SINUS SURGERY  2015   [facility]    Family History  Problem Relation Age of Onset   Skin cancer Father    Multiple sclerosis Sister    Non-Hodgkin's lymphoma Brother     Social History:  reports that he has quit smoking. His smoking use included cigarettes. He smoked an average of 2 packs per day. He has never used smokeless tobacco. He reports that he does not currently use alcohol after a past usage of about 42.0 standard drinks per week. He reports that he does not currently use drugs.  Allergies:  Allergies  Allergen Reactions   Ciprocin-Fluocin-Procin [Fluocinolone] Anaphylaxis    Hip hurt   Ciprofloxacin    Colchicine    Duloxetine Diarrhea, Nausea And Vomiting and Other (See Comments)    Altered mental status    Medications: Scheduled:  docusate sodium  100 mg Oral BID   folic acid  1 mg Oral Daily   heparin  5,000  Units Subcutaneous Q8H   LORazepam  0-4 mg Oral Q6H   Followed by   Derrill Memo ON 09/12/2021] LORazepam  0-4 mg Oral Q12H   multivitamin with minerals  1 tablet Oral Daily   pantoprazole  40 mg Oral Daily   polyethylene glycol  17 g Oral Daily   sodium chloride  1 g Oral BID WC   thiamine  100 mg Oral Daily   Or   thiamine  100 mg Intravenous Daily    Results for orders placed or performed during the hospital encounter of 09/10/21 (from the past 48 hour(s))  Glucose, capillary     Status: Abnormal   Collection Time: 09/10/21  7:28 PM  Result Value Ref Range   Glucose-Capillary 108 (H) 70 - 99 mg/dL    Comment: Glucose reference range applies only to samples taken after fasting for at least 8 hours.    CT HEAD WO CONTRAST (5MM)  Result Date: 09/10/2021 CLINICAL DATA:  Unwitnessed fall, found down EXAM: CT HEAD WITHOUT CONTRAST CT CERVICAL SPINE WITHOUT CONTRAST TECHNIQUE: Multidetector CT imaging of the head and cervical spine was performed following the standard protocol without intravenous contrast. Multiplanar CT image reconstructions of the cervical spine were also generated. RADIATION DOSE REDUCTION: This exam was performed according to the departmental dose-optimization program which includes automated exposure control, adjustment of the mA and/or kV  according to patient size and/or use of iterative reconstruction technique. COMPARISON:  09/04/2021 FINDINGS: CT HEAD FINDINGS Brain: No definite evidence of acute infarction, hemorrhage, hydrocephalus, extra-axial collection or mass lesion/mass effect. Unchanged hyperdense subependymal nodule of the posterior right parietal lobe measuring no greater than 0.5 cm (series 2, image 17). Periventricular and deep white matter hypodensity. Vascular: No hyperdense vessel or unexpected calcification. Skull: Normal. Negative for fracture or focal lesion. Sinuses/Orbits: No acute finding. Other: None. CT CERVICAL SPINE FINDINGS Alignment: Normal. Skull base  and vertebrae: No acute fracture. No primary bone lesion or focal pathologic process. Soft tissues and spinal canal: No prevertebral fluid or swelling. No visible canal hematoma. Disc levels: Focally moderate disc space height loss and osteophytosis of C5-C6 and mild disc space height loss and osteophytosis of C6-C7, with otherwise preserved disc spaces. Upper chest: Please see separately reported examination of the chest. Other: None. IMPRESSION: 1. No acute intracranial pathology. Small-vessel white matter disease. 2. Unchanged hyperdense subependymal nodule of the posterior right parietal lobe measuring no greater than 0.5 cm. Differential considerations again include a small hematoma, hamartoma, or cavernoma and as previously reported, could be further evaluated by contrast enhanced MRI. 3. No fracture or static subluxation of the cervical spine. Electronically Signed   By: Delanna Ahmadi M.D.   On: 09/10/2021 13:02   CT Angio Chest PE W and/or Wo Contrast  Result Date: 09/10/2021 CLINICAL DATA:  Chest pain, weakness, recent fall EXAM: CT ANGIOGRAPHY CHEST WITH CONTRAST TECHNIQUE: Multidetector CT imaging of the chest was performed using the standard protocol during bolus administration of intravenous contrast. Multiplanar CT image reconstructions and MIPs were obtained to evaluate the vascular anatomy. RADIATION DOSE REDUCTION: This exam was performed according to the departmental dose-optimization program which includes automated exposure control, adjustment of the mA and/or kV according to patient size and/or use of iterative reconstruction technique. CONTRAST:  85mL OMNIPAQUE IOHEXOL 350 MG/ML SOLN COMPARISON:  None. FINDINGS: Cardiovascular: Satisfactory opacification of the pulmonary arteries to the segmental level. No evidence of pulmonary embolism. Normal heart size. Left and right coronary artery calcifications. No pericardial effusion. Aortic atherosclerosis. Mediastinum/Nodes: No enlarged  mediastinal, hilar, or axillary lymph nodes. Thyroid gland, trachea, and esophagus demonstrate no significant findings. Lungs/Pleura: Large, loculated appearing left pleural effusion with near-total atelectasis of the left lung. Moderate centrilobular and paraseptal emphysema. Evaluation of the aerated portions of the right lung is limited by breath motion artifact, however within this limitation, there is dependent heterogeneous airspace opacity superimposed upon emphysema (series 7, image 63). Upper Abdomen: Please see separately reported examination of the abdomen and pelvis. Musculoskeletal: No chest wall abnormality. Subacute appearing, partially callused fractures of the posterolateral left ninth and tenth ribs (series 6, image 313, 334). Review of the MIP images confirms the above findings. IMPRESSION: 1. Negative examination for pulmonary embolism. 2. Large, loculated appearing left pleural effusion with near-total atelectasis of the left lung. 3. Subacute appearing, partially callused fractures of the posterolateral left ninth and tenth ribs. 4. Evaluation of the aerated portions of the right lung is limited by breath motion artifact, however within this limitation, there is dependent heterogeneous airspace opacity superimposed upon emphysema. Findings are consistent with infection or aspiration. 5. Coronary artery disease. Aortic Atherosclerosis (ICD10-I70.0) and Emphysema (ICD10-J43.9). Electronically Signed   By: Delanna Ahmadi M.D.   On: 09/10/2021 13:17   CT Cervical Spine Wo Contrast  Result Date: 09/10/2021 CLINICAL DATA:  Unwitnessed fall, found down EXAM: CT HEAD WITHOUT CONTRAST CT CERVICAL SPINE WITHOUT CONTRAST  TECHNIQUE: Multidetector CT imaging of the head and cervical spine was performed following the standard protocol without intravenous contrast. Multiplanar CT image reconstructions of the cervical spine were also generated. RADIATION DOSE REDUCTION: This exam was performed according to  the departmental dose-optimization program which includes automated exposure control, adjustment of the mA and/or kV according to patient size and/or use of iterative reconstruction technique. COMPARISON:  09/04/2021 FINDINGS: CT HEAD FINDINGS Brain: No definite evidence of acute infarction, hemorrhage, hydrocephalus, extra-axial collection or mass lesion/mass effect. Unchanged hyperdense subependymal nodule of the posterior right parietal lobe measuring no greater than 0.5 cm (series 2, image 17). Periventricular and deep white matter hypodensity. Vascular: No hyperdense vessel or unexpected calcification. Skull: Normal. Negative for fracture or focal lesion. Sinuses/Orbits: No acute finding. Other: None. CT CERVICAL SPINE FINDINGS Alignment: Normal. Skull base and vertebrae: No acute fracture. No primary bone lesion or focal pathologic process. Soft tissues and spinal canal: No prevertebral fluid or swelling. No visible canal hematoma. Disc levels: Focally moderate disc space height loss and osteophytosis of C5-C6 and mild disc space height loss and osteophytosis of C6-C7, with otherwise preserved disc spaces. Upper chest: Please see separately reported examination of the chest. Other: None. IMPRESSION: 1. No acute intracranial pathology. Small-vessel white matter disease. 2. Unchanged hyperdense subependymal nodule of the posterior right parietal lobe measuring no greater than 0.5 cm. Differential considerations again include a small hematoma, hamartoma, or cavernoma and as previously reported, could be further evaluated by contrast enhanced MRI. 3. No fracture or static subluxation of the cervical spine. Electronically Signed   By: Delanna Ahmadi M.D.   On: 09/10/2021 13:02   CT ABDOMEN PELVIS W CONTRAST  Result Date: 09/10/2021 CLINICAL DATA:  Abdominal pain, weakness EXAM: CT ABDOMEN AND PELVIS WITH CONTRAST TECHNIQUE: Multidetector CT imaging of the abdomen and pelvis was performed using the standard  protocol following bolus administration of intravenous contrast. RADIATION DOSE REDUCTION: This exam was performed according to the departmental dose-optimization program which includes automated exposure control, adjustment of the mA and/or kV according to patient size and/or use of iterative reconstruction technique. CONTRAST:  84mL OMNIPAQUE IOHEXOL 350 MG/ML SOLN COMPARISON:  CT lumbar spine, 08/18/2021, CT abdomen pelvis, 04/08/2019 FINDINGS: Lower chest: Please see separately reported examination of the chest. Hepatobiliary: No solid liver abnormality is seen. No gallstones, gallbladder wall thickening, or biliary dilatation. Pancreas: Unremarkable. No pancreatic ductal dilatation or surrounding inflammatory changes. Spleen: Normal in size without significant abnormality. Adrenals/Urinary Tract: Adrenal glands are unremarkable. Kidneys are normal, without renal calculi, solid lesion, or hydronephrosis. Bladder is unremarkable. Stomach/Bowel: Stomach is within normal limits. Appendix is not clearly visualized. No evidence of bowel wall thickening, distention, or inflammatory changes. Vascular/Lymphatic: Aortic atherosclerosis. No enlarged abdominal or pelvic lymph nodes. Reproductive: No mass or other significant abnormality. Other: No abdominal wall hernia or abnormality. No ascites. Musculoskeletal: Unchanged superior endplate wedge deformities of T12 and L1, with a high-grade vertebral plana deformity of L4 (series 8, image 69). IMPRESSION: 1. No acute CT findings of the abdomen or pelvis to explain abdominal pain. 2. Unchanged superior endplate wedge deformities of T12 and L1, with a high-grade vertebral plana deformity of L4. No new fracture. Aortic Atherosclerosis (ICD10-I70.0). Electronically Signed   By: Delanna Ahmadi M.D.   On: 09/10/2021 13:10   DG Chest Portable 1 View  Result Date: 09/10/2021 CLINICAL DATA:  Fall. EXAM: PORTABLE CHEST 1 VIEW COMPARISON:  08/15/2021 FINDINGS: Interval development  of large left pleural effusion with left base collapse/consolidation. Heart  is enlarged. Interstitial markings are diffusely coarsened with chronic features. Gaseous distention of bowel loops noted in the upper abdomen with probable gas distended colon under the right hemidiaphragm although intraperitoneal free air cannot be entirely excluded. IMPRESSION: 1. Interval development of large left pleural effusion with left base collapse/consolidation. Given the unilateral involvement, chest CT with contrast may prove helpful to further evaluate. 2. Cardiomegaly. 3. Gaseous distention of bowel loops in the upper abdomen, incompletely assessed. Lucency under the right hemidiaphragm likely secondary to gas in the hepatic flexure but intraperitoneal free air cannot be excluded. CT abdomen/pelvis could be used to further evaluate as clinically warranted. Electronically Signed   By: Misty Stanley M.D.   On: 09/10/2021 12:04   DG Knee Complete 4 Views Left  Result Date: 09/10/2021 CLINICAL DATA:  Fall. EXAM: LEFT KNEE - COMPLETE 4+ VIEW COMPARISON:  None. FINDINGS: No evidence of fracture, dislocation, or joint effusion. No evidence of arthropathy or other focal bone abnormality. Soft tissues are unremarkable. IMPRESSION: Negative. Electronically Signed   By: Misty Stanley M.D.   On: 09/10/2021 12:01   DG Hip Unilat W or Wo Pelvis 2-3 Views Left  Result Date: 09/10/2021 CLINICAL DATA:  71 year old male status post fall. Found down. Pain and weakness. EXAM: DG HIP (WITH OR WITHOUT PELVIS) 2-3V LEFT COMPARISON:  Left hip series 08/24/2021. FINDINGS: Femoral heads remain normally located. Pelvis appears stable and intact. Grossly intact proximal right femur. Calcified iliofemoral atherosclerosis. Proximal left femur appears stable and intact. No acute osseous abnormality identified. Visible bowel-gas pattern within normal limits. IMPRESSION: No acute fracture or dislocation identified about the left hip or pelvis.  Electronically Signed   By: Genevie Ann M.D.   On: 09/10/2021 12:02    Review of Systems  Constitutional:  Positive for activity change.  Respiratory:  Positive for shortness of breath and wheezing.   Cardiovascular:  Positive for chest pain.  Blood pressure 115/71, pulse (!) 111, temperature 98.2 F (36.8 C), temperature source Oral, resp. rate (!) 22, SpO2 92 %. Physical Exam Vitals reviewed.  Constitutional:      Appearance: He is ill-appearing.  HENT:     Head: Normocephalic and atraumatic.  Eyes:     Extraocular Movements: Extraocular movements intact.  Cardiovascular:     Rate and Rhythm: Regular rhythm. Tachycardia present.     Heart sounds: Normal heart sounds. No murmur heard.   No friction rub. No gallop.  Pulmonary:     Breath sounds: Wheezing (on right) present.     Comments: Absent BS left lower 2/3 Abdominal:     General: There is no distension.     Palpations: Abdomen is soft.  Skin:    General: Skin is warm and dry.  Neurological:     General: No focal deficit present.     Cranial Nerves: No cranial nerve deficit.     Motor: Weakness (generalized) present.    Assessment/Plan: 71 yo man with history of tobacco and ethanol abuse, falls, gout, anxiety and depression admitted with a large left pleural effusion. Had a recent fall with hospitalization, discharged to SNF then spent a week in the ED awaiting placement. Was having pain issues and likely developed pneumonia, now complicated by empyema.  He is currently stable, but will need VATS to drain empyema and decorticate the lung.  Agree with broad spectrum antibiotics and general resuscitation and plan surgery tomorrow Am- late morning.  I discussed the general nature of the procedure, including the need for general anesthesia,  the incisions to be used, and the use of drainage tubes postoperatively with Mr. Degraffenreid.  We discussed the expected hospital stay, overall recovery and short and long term outcomes.  I  informed him of the indications, risks, benefits and alternatives.  He understands the risks include, but are not limited to death, stroke, MI, DVT/PE, bleeding, possible need for transfusion, infections, prolonged air leak, cardiac arrhythmias, as well as other organ system dysfunction including respiratory, renal, or GI complications.   He accepts the risks and agrees to proceed.   Melrose Nakayama 09/10/2021, 7:39 PM

## 2021-09-10 NOTE — ED Notes (Signed)
Per Phil at [facility] waiting for actual bed assignment  [id number]

## 2021-09-10 NOTE — ED Notes (Signed)
Called [facility] spoke to [facility] for ICU Attending [phone]

## 2021-09-10 NOTE — Consult Note (Signed)
Reason for Consult:Left empyema  Referring Physician: Dr. Carlis Abbott CCM  Lance House is an 71 y.o. male.  HPI: 71 yo man admitted with SOB and sepsis found to have large left pleural effusion.  PMH of tobacco abuse, ethanol abuse, gout.  Recent admission at [facility] after fall while intoxicated. Hospitalized from 1/24-2/2. for Compression fractures and ICH.  Dc'ed to SNF but back in ED within 24 hours with worsening pain. Then stayed in ED for 9 days. Today, short of breath and weak. In [place] ED noted to be hypoxic. CT showed a large left pleural effusion. Transferred to [facility] for further management.  Currently on NB with sats in 94-96 range. C/o left flank/ chest pain. Has been started on broad spectrum antibiotics.    Past Medical History:  Diagnosis Date   Anxiety    Depression    Gout     Past Surgical History:  Procedure Laterality Date   APPENDECTOMY  1994   INGUINAL HERNIA REPAIR Left 04/13/2019   Procedure: HERNIA REPAIR INGUINAL ADULT OPEN, LEFT;  Surgeon: Fredirick Maudlin, MD;  Location: [facility] ORS;  Service: General;  Laterality: Left;   NASAL SINUS SURGERY  2015   [facility]    Family History  Problem Relation Age of Onset   Skin cancer Father    Multiple sclerosis Sister    Non-Hodgkin's lymphoma Brother     Social History:  reports that he has quit smoking. His smoking use included cigarettes. He smoked an average of 2 packs per day. He has never used smokeless tobacco. He reports that he does not currently use alcohol after a past usage of about 42.0 standard drinks per week. He reports that he does not currently use drugs.  Allergies:  Allergies  Allergen Reactions   Ciprocin-Fluocin-Procin [Fluocinolone] Anaphylaxis    Hip hurt   Ciprofloxacin    Colchicine    Duloxetine Diarrhea, Nausea And Vomiting and Other (See Comments)    Altered mental status    Medications: Scheduled:  docusate sodium  100 mg Oral BID   folic acid  1 mg Oral Daily   heparin  5,000  Units Subcutaneous Q8H   LORazepam  0-4 mg Oral Q6H   Followed by   Derrill Memo ON 09/12/2021] LORazepam  0-4 mg Oral Q12H   multivitamin with minerals  1 tablet Oral Daily   pantoprazole  40 mg Oral Daily   polyethylene glycol  17 g Oral Daily   sodium chloride  1 g Oral BID WC   thiamine  100 mg Oral Daily   Or   thiamine  100 mg Intravenous Daily    Results for orders placed or performed during the hospital encounter of 09/10/21 (from the past 48 hour(s))  Glucose, capillary     Status: Abnormal   Collection Time: 09/10/21  7:28 PM  Result Value Ref Range   Glucose-Capillary 108 (H) 70 - 99 mg/dL    Comment: Glucose reference range applies only to samples taken after fasting for at least 8 hours.    CT HEAD WO CONTRAST (5MM)  Result Date: 09/10/2021 CLINICAL DATA:  Unwitnessed fall, found down EXAM: CT HEAD WITHOUT CONTRAST CT CERVICAL SPINE WITHOUT CONTRAST TECHNIQUE: Multidetector CT imaging of the head and cervical spine was performed following the standard protocol without intravenous contrast. Multiplanar CT image reconstructions of the cervical spine were also generated. RADIATION DOSE REDUCTION: This exam was performed according to the departmental dose-optimization program which includes automated exposure control, adjustment of the mA and/or kV  according to patient size and/or use of iterative reconstruction technique. COMPARISON:  09/04/2021 FINDINGS: CT HEAD FINDINGS Brain: No definite evidence of acute infarction, hemorrhage, hydrocephalus, extra-axial collection or mass lesion/mass effect. Unchanged hyperdense subependymal nodule of the posterior right parietal lobe measuring no greater than 0.5 cm (series 2, image 17). Periventricular and deep white matter hypodensity. Vascular: No hyperdense vessel or unexpected calcification. Skull: Normal. Negative for fracture or focal lesion. Sinuses/Orbits: No acute finding. Other: None. CT CERVICAL SPINE FINDINGS Alignment: Normal. Skull base  and vertebrae: No acute fracture. No primary bone lesion or focal pathologic process. Soft tissues and spinal canal: No prevertebral fluid or swelling. No visible canal hematoma. Disc levels: Focally moderate disc space height loss and osteophytosis of C5-C6 and mild disc space height loss and osteophytosis of C6-C7, with otherwise preserved disc spaces. Upper chest: Please see separately reported examination of the chest. Other: None. IMPRESSION: 1. No acute intracranial pathology. Small-vessel white matter disease. 2. Unchanged hyperdense subependymal nodule of the posterior right parietal lobe measuring no greater than 0.5 cm. Differential considerations again include a small hematoma, hamartoma, or cavernoma and as previously reported, could be further evaluated by contrast enhanced MRI. 3. No fracture or static subluxation of the cervical spine. Electronically Signed   By: Delanna Ahmadi M.D.   On: 09/10/2021 13:02   CT Angio Chest PE W and/or Wo Contrast  Result Date: 09/10/2021 CLINICAL DATA:  Chest pain, weakness, recent fall EXAM: CT ANGIOGRAPHY CHEST WITH CONTRAST TECHNIQUE: Multidetector CT imaging of the chest was performed using the standard protocol during bolus administration of intravenous contrast. Multiplanar CT image reconstructions and MIPs were obtained to evaluate the vascular anatomy. RADIATION DOSE REDUCTION: This exam was performed according to the departmental dose-optimization program which includes automated exposure control, adjustment of the mA and/or kV according to patient size and/or use of iterative reconstruction technique. CONTRAST:  69mL OMNIPAQUE IOHEXOL 350 MG/ML SOLN COMPARISON:  None. FINDINGS: Cardiovascular: Satisfactory opacification of the pulmonary arteries to the segmental level. No evidence of pulmonary embolism. Normal heart size. Left and right coronary artery calcifications. No pericardial effusion. Aortic atherosclerosis. Mediastinum/Nodes: No enlarged  mediastinal, hilar, or axillary lymph nodes. Thyroid gland, trachea, and esophagus demonstrate no significant findings. Lungs/Pleura: Large, loculated appearing left pleural effusion with near-total atelectasis of the left lung. Moderate centrilobular and paraseptal emphysema. Evaluation of the aerated portions of the right lung is limited by breath motion artifact, however within this limitation, there is dependent heterogeneous airspace opacity superimposed upon emphysema (series 7, image 63). Upper Abdomen: Please see separately reported examination of the abdomen and pelvis. Musculoskeletal: No chest wall abnormality. Subacute appearing, partially callused fractures of the posterolateral left ninth and tenth ribs (series 6, image 313, 334). Review of the MIP images confirms the above findings. IMPRESSION: 1. Negative examination for pulmonary embolism. 2. Large, loculated appearing left pleural effusion with near-total atelectasis of the left lung. 3. Subacute appearing, partially callused fractures of the posterolateral left ninth and tenth ribs. 4. Evaluation of the aerated portions of the right lung is limited by breath motion artifact, however within this limitation, there is dependent heterogeneous airspace opacity superimposed upon emphysema. Findings are consistent with infection or aspiration. 5. Coronary artery disease. Aortic Atherosclerosis (ICD10-I70.0) and Emphysema (ICD10-J43.9). Electronically Signed   By: Delanna Ahmadi M.D.   On: 09/10/2021 13:17   CT Cervical Spine Wo Contrast  Result Date: 09/10/2021 CLINICAL DATA:  Unwitnessed fall, found down EXAM: CT HEAD WITHOUT CONTRAST CT CERVICAL SPINE WITHOUT CONTRAST  TECHNIQUE: Multidetector CT imaging of the head and cervical spine was performed following the standard protocol without intravenous contrast. Multiplanar CT image reconstructions of the cervical spine were also generated. RADIATION DOSE REDUCTION: This exam was performed according to  the departmental dose-optimization program which includes automated exposure control, adjustment of the mA and/or kV according to patient size and/or use of iterative reconstruction technique. COMPARISON:  09/04/2021 FINDINGS: CT HEAD FINDINGS Brain: No definite evidence of acute infarction, hemorrhage, hydrocephalus, extra-axial collection or mass lesion/mass effect. Unchanged hyperdense subependymal nodule of the posterior right parietal lobe measuring no greater than 0.5 cm (series 2, image 17). Periventricular and deep white matter hypodensity. Vascular: No hyperdense vessel or unexpected calcification. Skull: Normal. Negative for fracture or focal lesion. Sinuses/Orbits: No acute finding. Other: None. CT CERVICAL SPINE FINDINGS Alignment: Normal. Skull base and vertebrae: No acute fracture. No primary bone lesion or focal pathologic process. Soft tissues and spinal canal: No prevertebral fluid or swelling. No visible canal hematoma. Disc levels: Focally moderate disc space height loss and osteophytosis of C5-C6 and mild disc space height loss and osteophytosis of C6-C7, with otherwise preserved disc spaces. Upper chest: Please see separately reported examination of the chest. Other: None. IMPRESSION: 1. No acute intracranial pathology. Small-vessel white matter disease. 2. Unchanged hyperdense subependymal nodule of the posterior right parietal lobe measuring no greater than 0.5 cm. Differential considerations again include a small hematoma, hamartoma, or cavernoma and as previously reported, could be further evaluated by contrast enhanced MRI. 3. No fracture or static subluxation of the cervical spine. Electronically Signed   By: Delanna Ahmadi M.D.   On: 09/10/2021 13:02   CT ABDOMEN PELVIS W CONTRAST  Result Date: 09/10/2021 CLINICAL DATA:  Abdominal pain, weakness EXAM: CT ABDOMEN AND PELVIS WITH CONTRAST TECHNIQUE: Multidetector CT imaging of the abdomen and pelvis was performed using the standard  protocol following bolus administration of intravenous contrast. RADIATION DOSE REDUCTION: This exam was performed according to the departmental dose-optimization program which includes automated exposure control, adjustment of the mA and/or kV according to patient size and/or use of iterative reconstruction technique. CONTRAST:  86mL OMNIPAQUE IOHEXOL 350 MG/ML SOLN COMPARISON:  CT lumbar spine, 08/18/2021, CT abdomen pelvis, 04/08/2019 FINDINGS: Lower chest: Please see separately reported examination of the chest. Hepatobiliary: No solid liver abnormality is seen. No gallstones, gallbladder wall thickening, or biliary dilatation. Pancreas: Unremarkable. No pancreatic ductal dilatation or surrounding inflammatory changes. Spleen: Normal in size without significant abnormality. Adrenals/Urinary Tract: Adrenal glands are unremarkable. Kidneys are normal, without renal calculi, solid lesion, or hydronephrosis. Bladder is unremarkable. Stomach/Bowel: Stomach is within normal limits. Appendix is not clearly visualized. No evidence of bowel wall thickening, distention, or inflammatory changes. Vascular/Lymphatic: Aortic atherosclerosis. No enlarged abdominal or pelvic lymph nodes. Reproductive: No mass or other significant abnormality. Other: No abdominal wall hernia or abnormality. No ascites. Musculoskeletal: Unchanged superior endplate wedge deformities of T12 and L1, with a high-grade vertebral plana deformity of L4 (series 8, image 69). IMPRESSION: 1. No acute CT findings of the abdomen or pelvis to explain abdominal pain. 2. Unchanged superior endplate wedge deformities of T12 and L1, with a high-grade vertebral plana deformity of L4. No new fracture. Aortic Atherosclerosis (ICD10-I70.0). Electronically Signed   By: Delanna Ahmadi M.D.   On: 09/10/2021 13:10   DG Chest Portable 1 View  Result Date: 09/10/2021 CLINICAL DATA:  Fall. EXAM: PORTABLE CHEST 1 VIEW COMPARISON:  08/15/2021 FINDINGS: Interval development  of large left pleural effusion with left base collapse/consolidation. Heart  is enlarged. Interstitial markings are diffusely coarsened with chronic features. Gaseous distention of bowel loops noted in the upper abdomen with probable gas distended colon under the right hemidiaphragm although intraperitoneal free air cannot be entirely excluded. IMPRESSION: 1. Interval development of large left pleural effusion with left base collapse/consolidation. Given the unilateral involvement, chest CT with contrast may prove helpful to further evaluate. 2. Cardiomegaly. 3. Gaseous distention of bowel loops in the upper abdomen, incompletely assessed. Lucency under the right hemidiaphragm likely secondary to gas in the hepatic flexure but intraperitoneal free air cannot be excluded. CT abdomen/pelvis could be used to further evaluate as clinically warranted. Electronically Signed   By: Misty Stanley M.D.   On: 09/10/2021 12:04   DG Knee Complete 4 Views Left  Result Date: 09/10/2021 CLINICAL DATA:  Fall. EXAM: LEFT KNEE - COMPLETE 4+ VIEW COMPARISON:  None. FINDINGS: No evidence of fracture, dislocation, or joint effusion. No evidence of arthropathy or other focal bone abnormality. Soft tissues are unremarkable. IMPRESSION: Negative. Electronically Signed   By: Misty Stanley M.D.   On: 09/10/2021 12:01   DG Hip Unilat W or Wo Pelvis 2-3 Views Left  Result Date: 09/10/2021 CLINICAL DATA:  71 year old male status post fall. Found down. Pain and weakness. EXAM: DG HIP (WITH OR WITHOUT PELVIS) 2-3V LEFT COMPARISON:  Left hip series 08/24/2021. FINDINGS: Femoral heads remain normally located. Pelvis appears stable and intact. Grossly intact proximal right femur. Calcified iliofemoral atherosclerosis. Proximal left femur appears stable and intact. No acute osseous abnormality identified. Visible bowel-gas pattern within normal limits. IMPRESSION: No acute fracture or dislocation identified about the left hip or pelvis.  Electronically Signed   By: Genevie Ann M.D.   On: 09/10/2021 12:02    Review of Systems  Constitutional:  Positive for activity change.  Respiratory:  Positive for shortness of breath and wheezing.   Cardiovascular:  Positive for chest pain.  Blood pressure 115/71, pulse (!) 111, temperature 98.2 F (36.8 C), temperature source Oral, resp. rate (!) 22, SpO2 92 %. Physical Exam Vitals reviewed.  Constitutional:      Appearance: He is ill-appearing.  HENT:     Head: Normocephalic and atraumatic.  Eyes:     Extraocular Movements: Extraocular movements intact.  Cardiovascular:     Rate and Rhythm: Regular rhythm. Tachycardia present.     Heart sounds: Normal heart sounds. No murmur heard.   No friction rub. No gallop.  Pulmonary:     Breath sounds: Wheezing (on right) present.     Comments: Absent BS left lower 2/3 Abdominal:     General: There is no distension.     Palpations: Abdomen is soft.  Skin:    General: Skin is warm and dry.  Neurological:     General: No focal deficit present.     Cranial Nerves: No cranial nerve deficit.     Motor: Weakness (generalized) present.    Assessment/Plan: 71 yo man with history of tobacco and ethanol abuse, falls, gout, anxiety and depression admitted with a large left pleural effusion. Had a recent fall with hospitalization, discharged to SNF then spent a week in the ED awaiting placement. Was having pain issues and likely developed pneumonia, now complicated by empyema.  He is currently stable, but will need VATS to drain empyema and decorticate the lung.  Agree with broad spectrum antibiotics and general resuscitation and plan surgery tomorrow Am- late morning.  I discussed the general nature of the procedure, including the need for general anesthesia,  the incisions to be used, and the use of drainage tubes postoperatively with Mr. Tukes.  We discussed the expected hospital stay, overall recovery and short and long term outcomes.  I  informed him of the indications, risks, benefits and alternatives.  He understands the risks include, but are not limited to death, stroke, MI, DVT/PE, bleeding, possible need for transfusion, infections, prolonged air leak, cardiac arrhythmias, as well as other organ system dysfunction including respiratory, renal, or GI complications.   He accepts the risks and agrees to proceed.   Melrose Nakayama 09/10/2021, 7:39 PM

## 2021-09-10 NOTE — H&P (Signed)
NAME:  Fatih Stalvey, MRN:  [id number], DOB:  April 25, 1951, LOS: 0 ADMISSION DATE:  09/10/2021, CONSULTATION DATE:  09/10/21 REFERRING MD:  Jari Pigg- EDP, CHIEF COMPLAINT:  loculated pleural effusion   History of Present Illness:  Mr. Sides is a 71 y/o gentleman with a history of former tobacco abuse, ETOH abuse with recent admission for traumatic ICH after a fall who presented to the ED today with complaints of intermittent SOB, weakness, left-sided chest pain that has worsened over the past several weeks. He decided to come to the ED from home today because his pain has never gotten better. When EMS arrived he was hypoxic to 86% on RA. He was recently admitted to the hospital from 1/24- 2/2 for an ICH and compression fractures suffered during a fall while intoxicated. He was discharged to [facility] and was brought back to the ED within about 2 hours, where he remained admitted in the ED from 2/2- 2/13 due to placement issues. He has chronic pain for which he takes oxycodone. He was transferred from [facility] ED to [facility] for TCTS evaluation with tentative plans for VATS with Dr. Roxan Hockey tomorrow.  Pertinent  Medical History  ETOH abuse- quit 3 months ago Tobacco abuse- quit recently Gout Depression Compression fractures- thoracic and lumbar ICH- Feb 2023  Significant Hospital Events: Including procedures, antibiotic start and stop dates in addition to other pertinent events   2/19 admitted  Interim History / Subjective:    Objective   Blood pressure 115/71, pulse (!) 111, temperature 98.2 F (36.8 C), temperature source Oral, resp. rate (!) 22, SpO2 92 %.       No intake or output data in the 24 hours ending 09/10/21 1901 There were no vitals filed for this visit.  Examination: General: chronically ill appearing man lying in bed in NAD HENT: [place]/AT, eyes anicteric Lungs: rhonchi bilaterally, reduced left lateral breath sounds Cardiovascular: S1S2, mildly tachycardic, reg  rhythm Abdomen: soft, NT Extremities: chronic stasis dermatitis, no LE edema, no cyanosis.  Derm: multiple scabs and minor wounds on his feet Neuro: awake, alert, answering questions appropriately, globally weak, moving all extremities  CT personally reviewed> large left loculated effusion, emphysema. Possible fibrosis vs emphysema with interstitial thickening due to pneumonia.  Resolved Hospital Problem list     Assessment & Plan:  Acute respiratory failure with hypoxia Loculated pleural effusion on the left, strong suspicion for empyema. Emphysema -supplemental O2 to maintain SpO2 >90%; can wean O2 for now -con't broad antibiotics -TCTS planning for VATS tomorrow; notified their team that he has arrived tonight. They will collect intra-operative cultures tomorrow. No plans for thora or chest tube since he is hemodynamically stable unless he decompensates overnight. -pulmonary hygiene -mucinex -bronchodilators PRN; not on BD as OP. Needs OP PFTs -oxycodone PRN for pain; has long history of opiate use  Sepsis due to pneumonia- associated respiratory failure and AKI -received 1L crystalloid in ED, blood cultures drawn -con't broad antibiotics- cefepime, flagyl, vanc -need intrapleural cultures -Needs definitive source control; plan is for VATS tomorrow but can perform tube thoracostomy if decompensates before that. AT risk for decompensation overnight & needs ICU monitoring.  H/o ETOH abuse -monitor for signs of withdrawal -vitamins  Mild protein energy malnutrition -vitamins -RD consult  Hyponatremia-- SIADH due to chest infection vs chronic due to ETOH abuse and malnutrition -monitor -salt tabs -may need hypertonic saline> start 75cc/h via PIV, BMP Q4h to monitor  Mild AKI -renally dose meds, avoid nephrotoxic meds -strict I/Os -  monitor  Lactic acidosis, resolved-no additional monitoring. Was potentially due to hypoxia.  Recent ICH, compression fractures,  rhabdo -supportive care  Wife would be surrogate decision maker.  Best Practice (right click and "Reselect all SmartList Selections" daily)   Diet/type: Regular consistency (see orders), NPO p mn DVT prophylaxis: prophylactic heparin  GI prophylaxis: N/A Lines: N/A Foley:  N/A Code Status:  full code Last date of multidisciplinary goals of care discussion [  ]  Labs   CBC: Recent Labs  Lab 09/04/21 1001 09/10/21 1104  WBC 10.8* 30.8*  NEUTROABS 8.2* 28.1*  HGB 13.8 15.6  HCT 41.8 45.3  MCV 96.8 89.9  PLT 481* 950    Basic Metabolic Panel: Recent Labs  Lab 09/04/21 1001 09/10/21 1104  NA 128* 127*  K 4.0 3.5  CL 93* 89*  CO2 30 28  GLUCOSE 98 103*  BUN <5* 15  CREATININE 0.42* 0.72  CALCIUM 8.6* 8.2*  MG  --  1.7   GFR: Estimated Creatinine Clearance: 99.9 mL/min (by C-G formula based on SCr of 0.72 mg/dL). Recent Labs  Lab 09/04/21 1001 09/10/21 1104 09/10/21 1324  PROCALCITON  --  0.82  --   WBC 10.8* 30.8*  --   LATICACIDVEN  --  2.1* 1.4    Liver Function Tests: Recent Labs  Lab 09/04/21 1001 09/10/21 1104  AST 15 25  ALT 11 20  ALKPHOS 105 84  BILITOT 0.7 1.1  PROT 6.5 6.0*  ALBUMIN 2.9* 2.5*   No results for input(s): LIPASE, AMYLASE in the last 168 hours. Recent Labs  Lab 09/10/21 1104  AMMONIA 13    ABG    Component Value Date/Time   HCO3 28.5 (H) 09/10/2021 1634   O2SAT 81.5 09/10/2021 1634     Coagulation Profile: No results for input(s): INR, PROTIME in the last 168 hours.  Cardiac Enzymes: Recent Labs  Lab 09/04/21 1001 09/10/21 1104  CKTOTAL 59 135    HbA1C: No results found for: HGBA1C  CBG: No results for input(s): GLUCAP in the last 168 hours.  Review of Systems:    Review of Systems: (bold if positive, otherwise negative)  General: fevers, chills, sweats HENT: rhinorrhea, congestion, sore throat Eyes: blurry vision, double vision Cardio: chest pain, palpitations, edema Pulm: wheezing, cough,  sputum production, hemoptysis, SOB Abd: heartburn, nausea, vomiting, diarrhea, bloody stools, abdominal pain GU: hematuria, dysuria Derm: rashes, wounds Heme/Lymph: adenopathy, bruising Neuro: syncope, headache, vertigo, numbness, weakness   Past Medical History:  He,  has a past medical history of Anxiety, Depression, and Gout.   Surgical History:   Past Surgical History:  Procedure Laterality Date   APPENDECTOMY  1994   INGUINAL HERNIA REPAIR Left 04/13/2019   Procedure: HERNIA REPAIR INGUINAL ADULT OPEN, LEFT;  Surgeon: Fredirick Maudlin, MD;  Location: [facility] ORS;  Service: General;  Laterality: Left;   NASAL SINUS SURGERY  2015   [facility]     Social History:   reports that he has quit smoking. His smoking use included cigarettes. He smoked an average of 2 packs per day. He has never used smokeless tobacco. He reports that he does not currently use alcohol after a past usage of about 42.0 standard drinks per week. He reports that he does not currently use drugs.   Family History:  His family history includes Multiple sclerosis in his sister; Non-Hodgkin's lymphoma in his brother; Skin cancer in his father.   Allergies Allergies  Allergen Reactions   Ciprocin-Fluocin-Procin [Fluocinolone] Anaphylaxis    Hip  hurt   Ciprofloxacin    Colchicine    Duloxetine Diarrhea, Nausea And Vomiting and Other (See Comments)    Altered mental status     Home Medications  Prior to Admission medications   Medication Sig Start Date End Date Taking? Authorizing Provider  acetaminophen (TYLENOL) 325 MG tablet Take 2 tablets (650 mg total) by mouth every 6 (six) hours as needed for mild pain (or Fever >/= 101). Patient not taking: Reported on 09/10/2021 08/24/21   Richarda Osmond, MD  albuterol (VENTOLIN HFA) 108 (90 Base) MCG/ACT inhaler Inhale 1-2 puffs into the lungs every 4 (four) hours as needed for shortness of breath. Patient not taking: Reported on 09/10/2021 07/08/19   [provider]  allopurinol (ZYLOPRIM) 300 MG tablet Take 300 mg by mouth daily. 05/16/20   [provider]  clonazePAM (KLONOPIN) 0.5 MG tablet Take 0.5 mg by mouth 2 (two) times daily as needed. 08/22/21   [provider]  clonazePAM (KLONOPIN) 1 MG tablet Take 0.5 tablets (0.5 mg total) by mouth 2 (two) times daily as needed. Patient not taking: Reported on 09/10/2021 08/22/21   Sharen Hones, MD  oxycodone (OXY-IR) 5 MG capsule Take 2 capsules (10 mg total) by mouth every 4 (four) hours as needed for pain. 09/04/21   Nena Polio, MD  Oxycodone HCl 10 MG TABS Take 1 tablet (10 mg total) by mouth every 4 (four) hours as needed. Patient not taking: Reported on 09/10/2021 08/22/21   Sharen Hones, MD  Vitamin D, Ergocalciferol, (DRISDOL) 1.25 MG (50000 UNIT) CAPS capsule Take 1 capsule by mouth once a week. 09/21/20   [provider]     Critical care time: 45 min.       Julian Hy, DO 09/10/21 7:02 PM [facility]

## 2021-09-10 NOTE — ED Notes (Signed)
Pt placed on NRB after sats noted to have dropped still on 4L [place]- trialled to on 6L [place] first with sats still in the 80s- sats in the high 90s on NRB

## 2021-09-10 NOTE — ED Notes (Signed)
Pt taken for scans 

## 2021-09-10 NOTE — ED Notes (Signed)
Called [facility] spoke w/Phil for possible transfer @2 :09pm

## 2021-09-10 NOTE — ED Provider Notes (Signed)
[facility] Provider Note    Event Date/Time   First MD Initiated Contact with Patient 09/10/21 1020     (approximate)   History   Chest Pain and Weakness   HPI  Lance House is a 71 y.o. male with anxiety, depression, EtOH dependence, gout who comes in with concerns for weakness.  Patient reports he has not been feeling well for the past 2 weeks with just increasing weakness.  He states that he does not has felt hungry.  He has been eating and drinking less.  He states that he had a fall where he was just weak and he lowered himself to the ground and could not get back up but he was there for multiple hours.  He states that he has developed some intermittent chest discomfort and abdominal discomfort.  Patient was noted to be hypoxic with EMS at 86% placed on 3 L  I reviewed the chart and patient had admission back in January 24 for compression fractures, rhabdo, intracranial hemorrhage, low sodiums where he was discharged on February 2 of this year      Physical Exam   Triage Vital Signs: ED Triage Vitals  Enc Vitals Group     BP --      Pulse Rate 09/10/21 1027 (!) 119     Resp 09/10/21 1027 (!) 22     Temp --      Temp src --      SpO2 09/10/21 1027 94 %     Weight --      Height --      Head Circumference --      Peak Flow --      Pain Score 09/10/21 1030 8     Pain Loc --      Pain Edu? --      Excl. in [place]? --     Most recent vital signs: Vitals:   09/10/21 1027  Pulse: (!) 119  Resp: (!) 22  SpO2: 94%     General: Awake, no distress.  Ao x3.  Patient is disheveled CV:  Good peripheral perfusion.  Tachycardic Resp:  Mild increased respirations with audible breathing and coughing mucus  On 3 L Abd:  No distention. Slightly tender Other:  No leg swelling.  Able to lift the right leg up off the bed but unable to lift the left leg due to some left knee pain and left hip pain.  Equal grip strength in his hands.  No obvious cranial  nerve deficits.   ED Results / Procedures / Treatments   Labs (all labs ordered are listed, but only abnormal results are displayed) Labs Reviewed  CULTURE, BLOOD (ROUTINE X 2)  CULTURE, BLOOD (ROUTINE X 2)  RESP PANEL BY RT-PCR (FLU A&B, COVID) ARPGX2  CBC WITH DIFFERENTIAL/PLATELET  COMPREHENSIVE METABOLIC PANEL  AMMONIA  URINE DRUG SCREEN, QUALITATIVE ([facility] ONLY)  ETHANOL  LACTIC ACID, PLASMA  LACTIC ACID, PLASMA  CK  MAGNESIUM  URINALYSIS, ROUTINE W REFLEX MICROSCOPIC  TROPONIN I (HIGH SENSITIVITY)     EKG  My interpretation of EKG:  Sinus tachycardia rate of 118 without any ST elevation or T wave inversions, QTc 487  RADIOLOGY I have reviewed the xray personally and patient does have large left pleural effusion as well as gaseous distention of the upper abdomen which they recommend CT imaging  X-ray of the hip and knee without evidence of fracture  PROCEDURES:  Critical Care performed: Yes, see critical care procedure  note(s)  .Critical Care Performed by: Vanessa [place], MD Authorized by: Vanessa McKin[place], MD   Critical care provider statement:    Critical care time (minutes):  75   Critical care was necessary to treat or prevent imminent or life-threatening deterioration of the following conditions:  Respiratory failure   Critical care was time spent personally by me on the following activities:  Development of treatment plan with patient or surrogate, discussions with consultants, evaluation of patient's response to treatment, examination of patient, ordering and review of laboratory studies, ordering and review of radiographic studies, ordering and performing treatments and interventions, pulse oximetry, re-evaluation of patient's condition and review of old charts .1-3 Lead EKG Interpretation Performed by: Vanessa [place], MD Authorized by: Vanessa [place], MD     Interpretation: abnormal     ECG rate:  115   ECG rate assessment: tachycardic     Rhythm: sinus  tachycardia     Ectopy: none     Conduction: normal     MEDICATIONS ORDERED IN ED: Medications  metroNIDAZOLE (FLAGYL) IVPB 500 mg (has no administration in time range)  vancomycin (VANCOCIN) IVPB 1000 mg/200 mL premix (has no administration in time range)  sodium chloride 0.9 % bolus 1,000 mL (1,000 mLs Intravenous New Bag/Given 09/10/21 1058)  fentaNYL (SUBLIMAZE) injection 50 mcg (50 mcg Intravenous Given 09/10/21 1059)  ondansetron (ZOFRAN) injection 4 mg (4 mg Intravenous Given 09/10/21 1100)  ceFEPIme (MAXIPIME) 2 g in sodium chloride 0.9 % 100 mL IVPB (2 g Intravenous New Bag/Given 09/10/21 1205)  iohexol (OMNIPAQUE) 350 MG/ML injection 75 mL (75 mLs Intravenous Contrast Given 09/10/21 1236)     IMPRESSION / MDM / ASSESSMENT AND PLAN / ED COURSE  I reviewed the triage vital signs and the nursing notes.                              Differential diagnosis includes, but is not limited to, dehydration, AKI, Electra abnormalities, rhabdomyolysis, x-rays evaluate for any injuries.  Patient placed on 3 L due to respiratory failure.  We will wait on creatinine to see if CTs need to be ordered to evaluate for PE, intercranial hemorrhage, abdominal obstruction  Patient CBC comes back with a white count of 30 which is 3 times that was 6 days ago.  Given that plus the tachycardia patient now meets sepsis criteria therefore broad-spectrum antibiotics were started given patient was just in the hospital.  CMP shows slightly low sodium and chloride but patient getting some IV fluids  Troponin is negative  EtOH negative  Lactate elevated but getting fluid  CK normal  X-ray concerning for new pleural effusion and recommend CT scans.  Given the concern for another fall we will also add on CT head CT cervical to evaluate for any intracranial hemorrhage or cervical fracture.  I had discussed with IR but they were not sure if this is going to need a larger chest tube which we do not really manage  here recommend to be talking to pulmonary  CT scan concerning for loculated pleural effusion and given the elevated white count.  I initially discussed with the hospitalist team but given concern for potential empyema recommended talk to pulmonary.  Dr. Keenan Bachelor who initially stated that we could try to transfer patient to [facility]  Discussed with CT surgery Dr. Roxan Hockey who would plan to do a VATS procedure probably tomorrow.  Patient sat starting to be  closer to 87% on 6 L therefore I placed on a nonrebreather.  Patient denies any alcohol use since recent discharge do not feel like he is going through withdrawals.  Do not see any tremor noted  Given the increasing oxygen requirement I decided to discuss with the ICU team and I discussed with Dr. Noemi Chapel who was agreeable to ICU transfer.  I explained that he has been stable on a nonrebreather but if he has worsening issues overnight they could do a thoracentesis to temporize until the VATS procedure.  I did ensure that there should be an ICU bed that patient was assigned to ensure he did not have a prolonged stay in the ER otherwise he would need bedside thoracentesis here.  They stated that they did have a bed available.  Patient was handed off to oncoming team pending transfer to the ICU   The patient is on the cardiac monitor to evaluate for evidence of arrhythmia and/or significant heart rate changes.   FINAL CLINICAL IMPRESSION(S) / ED DIAGNOSES   Final diagnoses:  Weakness  Acute respiratory failure with hypoxia (HCC)  Pleural effusion  Sepsis, due to unspecified organism, unspecified whether acute organ dysfunction present [facility])     Rx / DC Orders   ED Discharge Orders     None        Note:  This document was prepared using Dragon voice recognition software and may include unintentional dictation errors.   Vanessa [place], MD 09/10/21 [phone]

## 2021-09-10 NOTE — Progress Notes (Signed)
[facility] following for sepsis protocol. 

## 2021-09-10 NOTE — ED Notes (Signed)
[facility] to [facility] to [id number]   truck will come when available  per Ruby [id number]

## 2021-09-10 NOTE — Consult Note (Signed)
PHARMACY -  BRIEF ANTIBIOTIC NOTE   Pharmacy has received consult(s) for cefepime and vancomycin from an ED provider. Patient is also ordered metronidazole. The patient's profile has been reviewed for ht/wt/allergies/indication/available labs.    One time order(s) placed for  --Cefepime 2 g IV --Vancomycin 1 g IV  Further antibiotics/pharmacy consults should be ordered by admitting physician if indicated.                       Thank you, Benita Gutter 09/10/2021  11:35 AM

## 2021-09-10 NOTE — Progress Notes (Signed)
CODE SEPSIS - PHARMACY COMMUNICATION  **Broad Spectrum Antibiotics should be administered within 1 hour of Sepsis diagnosis**  Time Code Sepsis Called/Page Received: 1127  Antibiotics Ordered: Cefepime + vancomycin + metronidazole  Time of 1st antibiotic administration: 1205  Additional action taken by pharmacy: N/A  Benita Gutter 09/10/2021  11:36 AM

## 2021-09-10 NOTE — ED Notes (Signed)
Report given to Yvone Neu, RN at [facility]

## 2021-09-10 NOTE — Progress Notes (Signed)
Pharmacy Antibiotic Note  Lance House is a 71 y.o. male admitted on 09/10/2021 with sepsis and empyema.  Pharmacy has been consulted for cefepime and vancomycin dosing (he is also on IV flagyl) -WBC= 30, PCT= 0.82, CrCl ~ 100 -Cefepime 2gm given ~13:30am and vancomycin 1000mg  given ~ 5pm  Plan: -Cefepime 2gm IV q8h -vancomycin 1000mg  IV now (to complete a 2000mg  IV load) followed by 1250mg  IV q12h (estimated AUC using SCr 0.8) -Will follow renal function, cultures and clinical progress       Temp (24hrs), Avg:98.1 F (36.7 C), Min:97.5 F (36.4 C), Max:98.7 F (37.1 C)  Recent Labs  Lab 09/04/21 1001 09/10/21 1104 09/10/21 1324  WBC 10.8* 30.8*  --   CREATININE 0.42* 0.72  --   LATICACIDVEN  --  2.1* 1.4    Estimated Creatinine Clearance: 99.9 mL/min (by C-G formula based on SCr of 0.72 mg/dL).    Allergies  Allergen Reactions   Ciprocin-Fluocin-Procin [Fluocinolone] Anaphylaxis    Hip hurt   Ciprofloxacin    Colchicine    Duloxetine Diarrhea, Nausea And Vomiting and Other (See Comments)    Altered mental status    Antimicrobials this admission: 2/19 cefepime 2/19 vanc  Dose adjustments this admission:   Microbiology results: 1/19 blood x2  Thank you for allowing pharmacy to be a part of this patients care.  Hildred Laser, PharmD Clinical Pharmacist **Pharmacist phone directory can now be found on [email or web address] ([email or web address]).  Listed under [place].

## 2021-09-11 ENCOUNTER — Other Ambulatory Visit: Payer: Self-pay

## 2021-09-11 ENCOUNTER — Inpatient Hospital Stay (HOSPITAL_COMMUNITY): Payer: Medicare HMO | Admitting: Anesthesiology

## 2021-09-11 ENCOUNTER — Inpatient Hospital Stay (HOSPITAL_COMMUNITY): Payer: Medicare HMO

## 2021-09-11 ENCOUNTER — Encounter (HOSPITAL_COMMUNITY)
Admission: AD | Disposition: A | Discharge status: Skilled Nursing Facility | Payer: Self-pay | Source: Other Acute Inpatient Hospital | Attending: Internal Medicine

## 2021-09-11 DIAGNOSIS — F418 Other specified anxiety disorders: Secondary | ICD-10-CM

## 2021-09-11 DIAGNOSIS — M1990 Unspecified osteoarthritis, unspecified site: Secondary | ICD-10-CM

## 2021-09-11 DIAGNOSIS — J869 Pyothorax without fistula: Secondary | ICD-10-CM

## 2021-09-11 DIAGNOSIS — L8990 Pressure ulcer of unspecified site, unspecified stage: Secondary | ICD-10-CM

## 2021-09-11 DIAGNOSIS — J9601 Acute respiratory failure with hypoxia: Secondary | ICD-10-CM | POA: Diagnosis not present

## 2021-09-11 HISTORY — DX: Pressure ulcer of unspecified site, unspecified stage: L89.90

## 2021-09-11 HISTORY — PX: VIDEO ASSISTED THORACOSCOPY (VATS)/DECORTICATION: SHX6171

## 2021-09-11 LAB — BASIC METABOLIC PANEL
Anion gap: 9 (ref 5–15)
Anion gap: 9 (ref 5–15)
BUN: 11 mg/dL (ref 8–23)
BUN: 13 mg/dL (ref 8–23)
CO2: 19 mmol/L — ABNORMAL LOW (ref 22–32)
CO2: 23 mmol/L (ref 22–32)
Calcium: 6.3 mg/dL — CL (ref 8.9–10.3)
Calcium: 7.7 mg/dL — ABNORMAL LOW (ref 8.9–10.3)
Chloride: 108 mmol/L (ref 98–111)
Chloride: 97 mmol/L — ABNORMAL LOW (ref 98–111)
Creatinine, Ser: 0.58 mg/dL — ABNORMAL LOW (ref 0.61–1.24)
Creatinine, Ser: 0.59 mg/dL — ABNORMAL LOW (ref 0.61–1.24)
GFR, Estimated: >60 mL/min (ref >60)
GFR, Estimated: >60 mL/min (ref >60)
Glucose, Bld: 100 mg/dL — ABNORMAL HIGH (ref 70–99)
Glucose, Bld: 78 mg/dL (ref 70–99)
Potassium: 3.4 mmol/L — ABNORMAL LOW (ref 3.5–5.1)
Potassium: 3.7 mmol/L (ref 3.5–5.1)
Sodium: 129 mmol/L — ABNORMAL LOW (ref 135–145)
Sodium: 136 mmol/L (ref 135–145)

## 2021-09-11 LAB — CBC
HCT: 41.1 % (ref 39.0–52.0)
Hemoglobin: 14 g/dL (ref 13.0–17.0)
MCH: 31.3 pg (ref 26.0–34.0)
MCHC: 34.1 g/dL (ref 30.0–36.0)
MCV: 91.7 fL (ref 80.0–100.0)
Platelets: 332 10*3/uL (ref 150–400)
RBC: 4.48 MIL/uL (ref 4.22–5.81)
RDW: 13.9 % (ref 11.5–15.5)
WBC: 28.2 10*3/uL — ABNORMAL HIGH (ref 4.0–10.5)
nRBC: 0 % (ref 0.0–0.2)

## 2021-09-11 LAB — POCT I-STAT 7, (LYTES, BLD GAS, ICA,H+H)
Acid-base deficit: 4 mmol/L — ABNORMAL HIGH (ref 0.0–2.0)
Acid-base deficit: 4 mmol/L — ABNORMAL HIGH (ref 0.0–2.0)
Bicarbonate: 27.9 mmol/L (ref 20.0–28.0)
Bicarbonate: 24.5 mmol/L (ref 20.0–28.0)
Calcium, Ion: 1.31 mmol/L (ref 1.15–1.40)
Calcium, Ion: 1.27 mmol/L (ref 1.15–1.40)
HCT: 43 % (ref 39.0–52.0)
HCT: 42 % (ref 39.0–52.0)
Hemoglobin: 14.6 g/dL (ref 13.0–17.0)
Hemoglobin: 14.3 g/dL (ref 13.0–17.0)
O2 Saturation: 92 %
O2 Saturation: 94 %
Patient temperature: 97.6
Potassium: 4.5 mmol/L (ref 3.5–5.1)
Potassium: 4.6 mmol/L (ref 3.5–5.1)
Sodium: 136 mmol/L (ref 135–145)
Sodium: 135 mmol/L (ref 135–145)
TCO2: 31 mmol/L (ref 22–32)
TCO2: 26 mmol/L (ref 22–32)
pCO2 arterial: 90.9 mmHg (ref 32–48)
pCO2 arterial: 56.3 mmHg — ABNORMAL HIGH (ref 32–48)
pH, Arterial: 7.095 — CL (ref 7.35–7.45)
pH, Arterial: 7.243 — ABNORMAL LOW (ref 7.35–7.45)
pO2, Arterial: 90 mmHg (ref 83–108)
pO2, Arterial: 83 mmHg (ref 83–108)

## 2021-09-11 LAB — GRAM STAIN

## 2021-09-11 LAB — COMPREHENSIVE METABOLIC PANEL
ALT: 16 U/L (ref 0–44)
AST: 19 U/L (ref 15–41)
Albumin: 1.6 g/dL — ABNORMAL LOW (ref 3.5–5.0)
Alkaline Phosphatase: 77 U/L (ref 38–126)
Anion gap: 8 (ref 5–15)
BUN: 12 mg/dL (ref 8–23)
CO2: 24 mmol/L (ref 22–32)
Calcium: 8.1 mg/dL — ABNORMAL LOW (ref 8.9–10.3)
Chloride: 104 mmol/L (ref 98–111)
Creatinine, Ser: 0.67 mg/dL (ref 0.61–1.24)
GFR, Estimated: >60 mL/min (ref >60)
Glucose, Bld: 114 mg/dL — ABNORMAL HIGH (ref 70–99)
Potassium: 4.7 mmol/L (ref 3.5–5.1)
Sodium: 136 mmol/L (ref 135–145)
Total Bilirubin: 0.5 mg/dL (ref 0.3–1.2)
Total Protein: 4.7 g/dL — ABNORMAL LOW (ref 6.5–8.1)

## 2021-09-11 LAB — CBC WITH DIFFERENTIAL/PLATELET
Abs Immature Granulocytes: 0.33 10*3/uL — ABNORMAL HIGH (ref 0.00–0.07)
Basophils Absolute: 0.1 10*3/uL (ref 0.0–0.1)
Basophils Relative: 1 %
Eosinophils Absolute: 0.8 10*3/uL — ABNORMAL HIGH (ref 0.0–0.5)
Eosinophils Relative: 3 %
HCT: 42.5 % (ref 39.0–52.0)
Hemoglobin: 13.7 g/dL (ref 13.0–17.0)
Immature Granulocytes: 1 %
Lymphocytes Relative: 1 %
Lymphs Abs: 0.4 10*3/uL — ABNORMAL LOW (ref 0.7–4.0)
MCH: 31.1 pg (ref 26.0–34.0)
MCHC: 32.2 g/dL (ref 30.0–36.0)
MCV: 96.6 fL (ref 80.0–100.0)
Monocytes Absolute: 0.9 10*3/uL (ref 0.1–1.0)
Monocytes Relative: 4 %
Neutro Abs: 21.7 10*3/uL — ABNORMAL HIGH (ref 1.7–7.7)
Neutrophils Relative %: 90 %
Platelets: 348 10*3/uL (ref 150–400)
RBC: 4.4 MIL/uL (ref 4.22–5.81)
RDW: 14 % (ref 11.5–15.5)
WBC: 24.3 10*3/uL — ABNORMAL HIGH (ref 4.0–10.5)
nRBC: 0 % (ref 0.0–0.2)

## 2021-09-11 LAB — ABO/RH: ABO/RH(D): A POS

## 2021-09-11 LAB — GLUCOSE, CAPILLARY
Glucose-Capillary: 123 mg/dL — ABNORMAL HIGH (ref 70–99)
Glucose-Capillary: 87 mg/dL (ref 70–99)
Glucose-Capillary: 101 mg/dL — ABNORMAL HIGH (ref 70–99)
Glucose-Capillary: 125 mg/dL — ABNORMAL HIGH (ref 70–99)

## 2021-09-11 LAB — PROTIME-INR
INR: 1.4 — ABNORMAL HIGH (ref 0.8–1.2)
Prothrombin Time: 17.4 seconds — ABNORMAL HIGH (ref 11.4–15.2)

## 2021-09-11 LAB — TYPE AND SCREEN
ABO/RH(D): A POS
Antibody Screen: NEGATIVE

## 2021-09-11 LAB — PHOSPHORUS
Phosphorus: 4.3 mg/dL (ref 2.5–4.6)
Phosphorus: 4.6 mg/dL (ref 2.5–4.6)

## 2021-09-11 LAB — MAGNESIUM
Magnesium: 1.8 mg/dL (ref 1.7–2.4)
Magnesium: 2.3 mg/dL (ref 1.7–2.4)

## 2021-09-11 LAB — APTT: aPTT: 33 seconds (ref 24–36)

## 2021-09-11 LAB — MRSA NEXT GEN BY PCR, NASAL: MRSA by PCR Next Gen: NOT DETECTED

## 2021-09-11 IMAGING — DX DG CHEST 1V PORT
2 series · 2 of 2 positions shown · non-contrast
Comparison: Previous studies including the examination of
[DATE]

CLINICAL DATA: Left pleural effusion

EXAM:
PORTABLE CHEST 1 VIEW

[chest ap (1 of 2)]
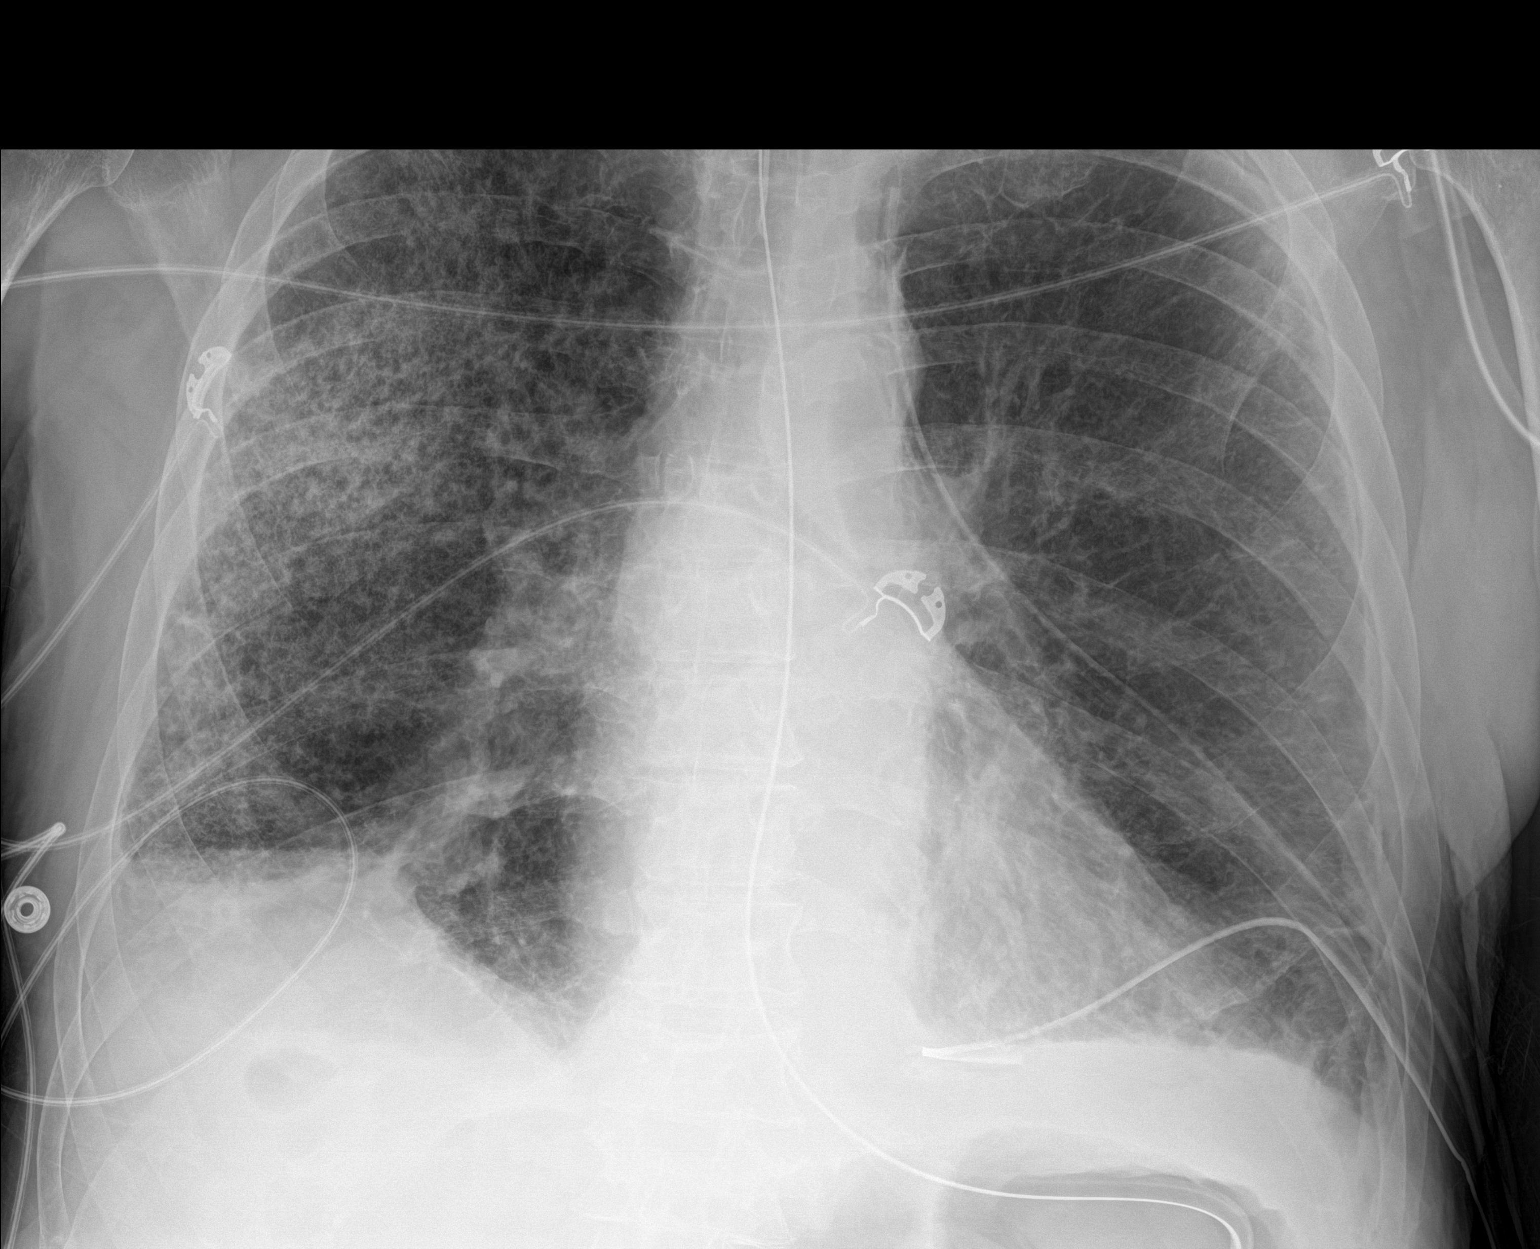

[chest ap (2 of 2)]
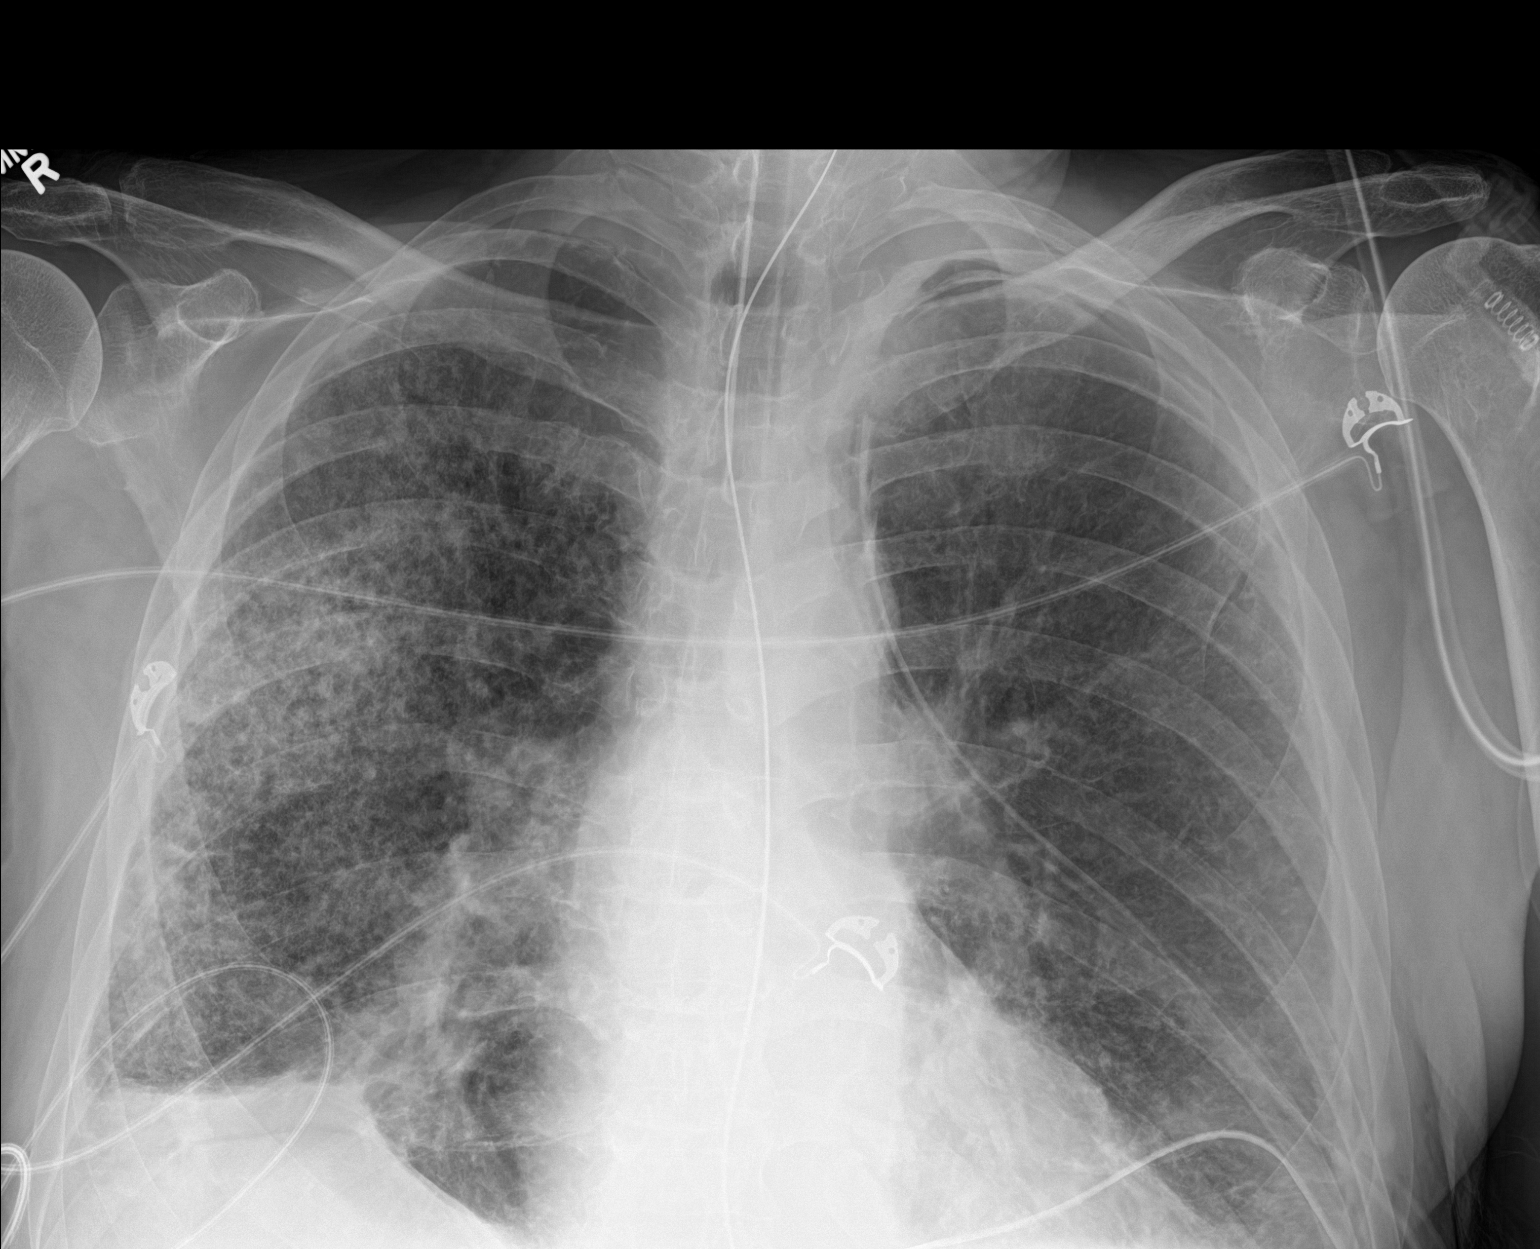

[2 of 2 positions shown; findings below may reference images not displayed]

FINDINGS: Cardiac size is within normal limits. Tip of endotracheal tube is
approximately 7.4 cm above the carina. Enteric tube is noted
traversing the esophagus with its distal portion in the stomach.
There is interval placement of left chest tube with its tip in the
medial left lower lung fields with almost complete clearing of left
pleural effusion. There is another left chest tube with its tip in
the medial left upper lung fields. There is small left apical
pneumothorax. There is possible minimal pneumothorax in the lateral
aspect of left lower lung fields. New patchy infiltrates are seen in
the right parahilar region and right lower lung fields. There is
blunting of right lateral CP angle.
IMPRESSION: There is almost complete resolution of large left pleural effusion
after placement of 2 left chest tubes. Small left pneumothorax.

New patchy infiltrates are seen in the right parahilar region and
right lower lung fields suggesting pneumonia or asymmetric pulmonary
edema. Blunting of right lateral CP angle suggests small right
pleural effusion.

Other findings as described in the body of the report.

## 2021-09-11 SURGERY — VIDEO ASSISTED THORACOSCOPY (VATS)/DECORTICATION
Anesthesia: General | Site: Chest | Laterality: Left

## 2021-09-11 MED ORDER — NOREPINEPHRINE 4 MG/250ML-% IV SOLN
2.0000 ug/min | INTRAVENOUS | Status: DC
  Administered 2021-09-11: 2 ug/min via INTRAVENOUS
  Administered 2021-09-12: 4 ug/min via INTRAVENOUS
  Filled 2021-09-11: qty 250

## 2021-09-11 MED ORDER — LIDOCAINE 2% (20 MG/ML) 5 ML SYRINGE
INTRAMUSCULAR | Status: AC
  Filled 2021-09-11: qty 5

## 2021-09-11 MED ORDER — BUPIVACAINE LIPOSOME 1.3 % IJ SUSP
INTRAMUSCULAR | Status: AC
  Filled 2021-09-11: qty 20

## 2021-09-11 MED ORDER — CHLORHEXIDINE GLUCONATE 0.12% ORAL RINSE (MEDLINE KIT)
15.0000 mL | Freq: Two times a day (BID) | OROMUCOSAL | Status: DC
  Administered 2021-09-11 – 2021-09-12 (×3): 15 mL via OROMUCOSAL

## 2021-09-11 MED ORDER — DEXMEDETOMIDINE HCL IN NACL 400 MCG/100ML IV SOLN
0.2000 ug/kg/h | INTRAVENOUS | Status: DC
  Filled 2021-09-11: qty 100

## 2021-09-11 MED ORDER — ACETAMINOPHEN 500 MG PO TABS
1000.0000 mg | ORAL_TABLET | Freq: Four times a day (QID) | ORAL | Status: DC
  Filled 2021-09-11: qty 2

## 2021-09-11 MED ORDER — DOCUSATE SODIUM 50 MG/5ML PO LIQD
100.0000 mg | Freq: Two times a day (BID) | ORAL | Status: DC
  Administered 2021-09-11 – 2021-09-12 (×2): 100 mg
  Filled 2021-09-11 (×2): qty 10

## 2021-09-11 MED ORDER — PANTOPRAZOLE SODIUM 40 MG IV SOLR
40.0000 mg | Freq: Every day | INTRAVENOUS | Status: DC
  Filled 2021-09-11: qty 10

## 2021-09-11 MED ORDER — ROCURONIUM BROMIDE 10 MG/ML (PF) SYRINGE
PREFILLED_SYRINGE | INTRAVENOUS | Status: DC | PRN
Start: 2021-09-11 — End: 2021-09-11
  Administered 2021-09-11: 60 mg via INTRAVENOUS
  Administered 2021-09-11: 40 mg via INTRAVENOUS

## 2021-09-11 MED ORDER — PROPOFOL 10 MG/ML IV BOLUS
INTRAVENOUS | Status: AC
  Filled 2021-09-11: qty 20

## 2021-09-11 MED ORDER — KETOROLAC TROMETHAMINE 15 MG/ML IJ SOLN
15.0000 mg | Freq: Three times a day (TID) | INTRAMUSCULAR | Status: DC | PRN
  Administered 2021-09-11: 15 mg via INTRAVENOUS
  Filled 2021-09-11: qty 1

## 2021-09-11 MED ORDER — SODIUM CHLORIDE 0.9 % IV SOLN
2.0000 g | Freq: Three times a day (TID) | INTRAVENOUS | Status: DC
  Administered 2021-09-11 – 2021-09-14 (×8): 2 g via INTRAVENOUS
  Filled 2021-09-11 (×7): qty 2

## 2021-09-11 MED ORDER — CLONAZEPAM 0.5 MG PO TABS
0.5000 mg | ORAL_TABLET | Freq: Two times a day (BID) | ORAL | Status: DC | PRN
Start: 2021-09-11 — End: 2021-09-11

## 2021-09-11 MED ORDER — KETOROLAC TROMETHAMINE 15 MG/ML IJ SOLN
15.0000 mg | Freq: Four times a day (QID) | INTRAMUSCULAR | Status: AC
  Administered 2021-09-11 – 2021-09-13 (×8): 15 mg via INTRAVENOUS
  Filled 2021-09-11 (×8): qty 1

## 2021-09-11 MED ORDER — METRONIDAZOLE 500 MG/100ML IV SOLN
500.0000 mg | Freq: Two times a day (BID) | INTRAVENOUS | Status: DC
  Administered 2021-09-11 – 2021-09-13 (×4): 500 mg via INTRAVENOUS
  Filled 2021-09-11 (×4): qty 100

## 2021-09-11 MED ORDER — EPHEDRINE 5 MG/ML INJ
INTRAVENOUS | Status: AC
  Filled 2021-09-11: qty 5

## 2021-09-11 MED ORDER — FENTANYL CITRATE (PF) 100 MCG/2ML IJ SOLN: 25.0000 ug | INTRAMUSCULAR | Status: DC | PRN

## 2021-09-11 MED ORDER — ALBUTEROL SULFATE HFA 108 (90 BASE) MCG/ACT IN AERS
INHALATION_SPRAY | RESPIRATORY_TRACT | Status: DC | PRN
  Administered 2021-09-11: 4 via RESPIRATORY_TRACT
  Administered 2021-09-11: 6 via RESPIRATORY_TRACT

## 2021-09-11 MED ORDER — ACETAMINOPHEN 160 MG/5ML PO SOLN: 1000.0000 mg | Freq: Four times a day (QID) | ORAL | Status: DC

## 2021-09-11 MED ORDER — BUPIVACAINE HCL (PF) 0.5 % IJ SOLN
INTRAMUSCULAR | Status: AC
  Filled 2021-09-11: qty 30

## 2021-09-11 MED ORDER — LORAZEPAM 2 MG/ML IJ SOLN: 0.0000 mg | Freq: Four times a day (QID) | INTRAMUSCULAR | Status: DC

## 2021-09-11 MED ORDER — LIDOCAINE 2% (20 MG/ML) 5 ML SYRINGE
INTRAMUSCULAR | Status: DC | PRN
  Administered 2021-09-11: 60 mg via INTRAVENOUS

## 2021-09-11 MED ORDER — LACTATED RINGERS IV SOLN
INTRAVENOUS | Status: DC | PRN
Start: 2021-09-11 — End: 2021-09-11

## 2021-09-11 MED ORDER — FENTANYL BOLUS VIA INFUSION
25.0000 ug | INTRAVENOUS | Status: DC | PRN
  Administered 2021-09-11: 100 ug via INTRAVENOUS
  Administered 2021-09-12: 50 ug via INTRAVENOUS
  Administered 2021-09-12 (×2): 100 ug via INTRAVENOUS
  Administered 2021-09-12: 50 ug via INTRAVENOUS
  Administered 2021-09-12: 25 ug via INTRAVENOUS
  Administered 2021-09-12: 50 ug via INTRAVENOUS
  Administered 2021-09-12: 100 ug via INTRAVENOUS
  Administered 2021-09-12 (×2): 50 ug via INTRAVENOUS
  Filled 2021-09-11: qty 100

## 2021-09-11 MED ORDER — LORAZEPAM 2 MG/ML IJ SOLN: 1.0000 mg | INTRAMUSCULAR | Status: DC | PRN

## 2021-09-11 MED ORDER — ONDANSETRON HCL 4 MG/2ML IJ SOLN
4.0000 mg | Freq: Four times a day (QID) | INTRAMUSCULAR | Status: DC | PRN
  Administered 2021-09-16 – 2021-09-18 (×2): 4 mg via INTRAVENOUS
  Filled 2021-09-11 (×2): qty 2

## 2021-09-11 MED ORDER — LACTATED RINGERS IV SOLN: INTRAVENOUS | Status: DC | PRN

## 2021-09-11 MED ORDER — MIDAZOLAM HCL 2 MG/2ML IJ SOLN
INTRAMUSCULAR | Status: AC
  Filled 2021-09-11: qty 2

## 2021-09-11 MED ORDER — THIAMINE HCL 100 MG PO TABS
100.0000 mg | ORAL_TABLET | Freq: Every day | ORAL | Status: DC
  Administered 2021-09-12: 100 mg
  Filled 2021-09-11: qty 1

## 2021-09-11 MED ORDER — MAGNESIUM SULFATE 2 GM/50ML IV SOLN
2.0000 g | Freq: Once | INTRAVENOUS | Status: AC
  Administered 2021-09-11: 2 g via INTRAVENOUS
  Filled 2021-09-11: qty 50

## 2021-09-11 MED ORDER — CHLORHEXIDINE GLUCONATE CLOTH 2 % EX PADS
6.0000 | MEDICATED_PAD | Freq: Every day | CUTANEOUS | Status: DC
  Administered 2021-09-11 – 2021-09-18 (×8): 6 via TOPICAL

## 2021-09-11 MED ORDER — 0.9 % SODIUM CHLORIDE (POUR BTL) OPTIME
TOPICAL | Status: DC | PRN
  Administered 2021-09-11 (×2): 1000 mL

## 2021-09-11 MED ORDER — ACETAMINOPHEN 500 MG PO TABS: 1000.0000 mg | ORAL_TABLET | Freq: Four times a day (QID) | ORAL | Status: DC

## 2021-09-11 MED ORDER — FENTANYL CITRATE (PF) 100 MCG/2ML IJ SOLN: 25.0000 ug | Freq: Once | INTRAMUSCULAR | Status: DC

## 2021-09-11 MED ORDER — LACTATED RINGERS IV BOLUS
1000.0000 mL | Freq: Once | INTRAVENOUS | Status: AC
  Administered 2021-09-11: 1000 mL via INTRAVENOUS

## 2021-09-11 MED ORDER — HEPARIN SODIUM (PORCINE) 1000 UNIT/ML IJ SOLN
INTRAMUSCULAR | Status: AC
  Filled 2021-09-11: qty 1

## 2021-09-11 MED ORDER — SENNOSIDES-DOCUSATE SODIUM 8.6-50 MG PO TABS: 1.0000 | ORAL_TABLET | Freq: Every day | ORAL | Status: DC

## 2021-09-11 MED ORDER — SODIUM CHLORIDE 0.9 % IV SOLN: 250.0000 mL | INTRAVENOUS | Status: DC

## 2021-09-11 MED ORDER — FENTANYL CITRATE (PF) 250 MCG/5ML IJ SOLN
INTRAMUSCULAR | Status: DC | PRN
  Administered 2021-09-11: 100 ug via INTRAVENOUS
  Administered 2021-09-11: 50 ug via INTRAVENOUS

## 2021-09-11 MED ORDER — FENTANYL 2500MCG IN NS 250ML (10MCG/ML) PREMIX INFUSION
25.0000 ug/h | INTRAVENOUS | Status: DC
  Administered 2021-09-11: 25 ug/h via INTRAVENOUS
  Filled 2021-09-11: qty 250

## 2021-09-11 MED ORDER — LORAZEPAM 1 MG PO TABS
1.0000 mg | ORAL_TABLET | ORAL | Status: DC | PRN
  Administered 2021-09-12 – 2021-09-13 (×2): 1 mg via ORAL
  Filled 2021-09-11 (×2): qty 1

## 2021-09-11 MED ORDER — PROTAMINE SULFATE 10 MG/ML IV SOLN
INTRAVENOUS | Status: AC
  Filled 2021-09-11: qty 5

## 2021-09-11 MED ORDER — KETOROLAC TROMETHAMINE 30 MG/ML IJ SOLN
30.0000 mg | Freq: Once | INTRAMUSCULAR | Status: DC
Start: 2021-09-11 — End: 2021-09-11
  Filled 2021-09-11: qty 1

## 2021-09-11 MED ORDER — ACETAMINOPHEN 325 MG PO TABS: 650.0000 mg | ORAL_TABLET | Freq: Four times a day (QID) | ORAL | Status: DC | PRN

## 2021-09-11 MED ORDER — THIAMINE HCL 100 MG/ML IJ SOLN: 100.0000 mg | Freq: Every day | INTRAMUSCULAR | Status: DC

## 2021-09-11 MED ORDER — LORAZEPAM 2 MG/ML IJ SOLN: 0.0000 mg | Freq: Two times a day (BID) | INTRAMUSCULAR | Status: DC

## 2021-09-11 MED ORDER — MORPHINE SULFATE (PF) 2 MG/ML IV SOLN
2.0000 mg | Freq: Once | INTRAVENOUS | Status: AC | PRN
  Administered 2021-09-11: 2 mg via INTRAVENOUS
  Filled 2021-09-11: qty 1

## 2021-09-11 MED ORDER — ADULT MULTIVITAMIN W/MINERALS CH
1.0000 | ORAL_TABLET | Freq: Every day | ORAL | Status: DC
  Administered 2021-09-12: 1
  Filled 2021-09-11: qty 1

## 2021-09-11 MED ORDER — POTASSIUM CHLORIDE 10 MEQ/100ML IV SOLN
10.0000 meq | INTRAVENOUS | Status: AC
  Administered 2021-09-11 (×4): 10 meq via INTRAVENOUS
  Filled 2021-09-11 (×4): qty 100

## 2021-09-11 MED ORDER — ACETAMINOPHEN 160 MG/5ML PO SOLN
1000.0000 mg | Freq: Four times a day (QID) | ORAL | Status: DC
  Administered 2021-09-12: 1000 mg
  Filled 2021-09-11: qty 40.6

## 2021-09-11 MED ORDER — PROTAMINE SULFATE 10 MG/ML IV SOLN
INTRAVENOUS | Status: AC
  Filled 2021-09-11: qty 25

## 2021-09-11 MED ORDER — PANTOPRAZOLE SODIUM 40 MG IV SOLR
40.0000 mg | INTRAVENOUS | Status: DC
Start: 2021-09-11 — End: 2021-09-13
  Administered 2021-09-11 – 2021-09-12 (×2): 40 mg via INTRAVENOUS
  Filled 2021-09-11: qty 10

## 2021-09-11 MED ORDER — NOREPINEPHRINE 4 MG/250ML-% IV SOLN
INTRAVENOUS | Status: AC
  Filled 2021-09-11: qty 250

## 2021-09-11 MED ORDER — POTASSIUM CHLORIDE IN NACL 20-0.9 MEQ/L-% IV SOLN
INTRAVENOUS | Status: DC
  Filled 2021-09-11 (×2): qty 1000

## 2021-09-11 MED ORDER — ORAL CARE MOUTH RINSE
15.0000 mL | OROMUCOSAL | Status: DC
  Administered 2021-09-11 – 2021-09-12 (×5): 15 mL via OROMUCOSAL

## 2021-09-11 MED ORDER — BISACODYL 5 MG PO TBEC
10.0000 mg | DELAYED_RELEASE_TABLET | Freq: Every day | ORAL | Status: DC
  Administered 2021-09-12 – 2021-09-14 (×3): 10 mg via ORAL
  Filled 2021-09-11 (×5): qty 2

## 2021-09-11 MED ORDER — FOLIC ACID 1 MG PO TABS
1.0000 mg | ORAL_TABLET | Freq: Every day | ORAL | Status: DC
  Administered 2021-09-12: 1 mg
  Filled 2021-09-11: qty 1

## 2021-09-11 MED ORDER — CALCIUM GLUCONATE-NACL 2-0.675 GM/100ML-% IV SOLN
2.0000 g | Freq: Once | INTRAVENOUS | Status: AC
Start: 2021-09-11 — End: 2021-09-12
  Administered 2021-09-11: 2000 mg via INTRAVENOUS
  Filled 2021-09-11: qty 100

## 2021-09-11 MED ORDER — FENTANYL CITRATE (PF) 250 MCG/5ML IJ SOLN
INTRAMUSCULAR | Status: AC
  Filled 2021-09-11: qty 5

## 2021-09-11 MED ORDER — PROPOFOL 1000 MG/100ML IV EMUL
0.0000 ug/kg/min | INTRAVENOUS | Status: DC
  Administered 2021-09-11 – 2021-09-12 (×3): 30 ug/kg/min via INTRAVENOUS
  Filled 2021-09-11: qty 100
  Filled 2021-09-11: qty 200

## 2021-09-11 MED ORDER — PROPOFOL 10 MG/ML IV BOLUS
INTRAVENOUS | Status: DC | PRN
  Administered 2021-09-11: 50 mg via INTRAVENOUS

## 2021-09-11 MED ORDER — DEXAMETHASONE SODIUM PHOSPHATE 10 MG/ML IJ SOLN
INTRAMUSCULAR | Status: DC | PRN
Start: 2021-09-11 — End: 2021-09-11
  Administered 2021-09-11: 10 mg via INTRAVENOUS

## 2021-09-11 MED ORDER — PHENYLEPHRINE HCL-NACL 20-0.9 MG/250ML-% IV SOLN
INTRAVENOUS | Status: DC | PRN
  Administered 2021-09-11: 25 ug/min via INTRAVENOUS

## 2021-09-11 MED ORDER — ACETAMINOPHEN 500 MG PO TABS
1000.0000 mg | ORAL_TABLET | Freq: Four times a day (QID) | ORAL | Status: AC
  Administered 2021-09-11 – 2021-09-16 (×15): 1000 mg via ORAL
  Filled 2021-09-11 (×17): qty 2

## 2021-09-11 MED ORDER — ALBUTEROL SULFATE HFA 108 (90 BASE) MCG/ACT IN AERS
INHALATION_SPRAY | RESPIRATORY_TRACT | Status: AC
  Filled 2021-09-11: qty 6.7

## 2021-09-11 SURGICAL SUPPLY — 62 items
ADH SKN CLS APL DERMABOND .7 (GAUZE/BANDAGES/DRESSINGS) ×1
APL SWBSTK 6 STRL LF DISP (MISCELLANEOUS) ×1
APPLICATOR COTTON TIP 6 STRL (MISCELLANEOUS) IMPLANT
APPLICATOR COTTON TIP 6IN STRL (MISCELLANEOUS) ×3 IMPLANT
CANISTER SUCT 3000ML PPV (MISCELLANEOUS) ×3 IMPLANT
CLEANER TIP ELECTROSURG 2X2 (MISCELLANEOUS) ×2 IMPLANT
CLIP VESOCCLUDE MED 6/CT (CLIP) ×3 IMPLANT
CNTNR URN SCR LID CUP LEK RST (MISCELLANEOUS) ×2 IMPLANT
CONN ST 1/4X3/8  BEN (MISCELLANEOUS) ×4
CONN ST 1/4X3/8 BEN (MISCELLANEOUS) IMPLANT
CONT SPEC 4OZ STRL OR WHT (MISCELLANEOUS) ×9
COVER SURGICAL LIGHT HANDLE (MISCELLANEOUS) ×3 IMPLANT
DERMABOND ADVANCED (GAUZE/BANDAGES/DRESSINGS) ×2
DERMABOND ADVANCED .7 DNX12 (GAUZE/BANDAGES/DRESSINGS) IMPLANT
DRAIN CHANNEL 28F RND 3/8 FF (WOUND CARE) ×4 IMPLANT
DRAPE CV SPLIT W-CLR ANES SCRN (DRAPES) ×3 IMPLANT
DRAPE ORTHO SPLIT 77X108 STRL (DRAPES) ×3
DRAPE SURG ORHT 6 SPLT 77X108 (DRAPES) ×1 IMPLANT
DRAPE WARM FLUID 44X44 (DRAPES) ×3 IMPLANT
DRESSING MEPILEX FLEX 4X4 (GAUZE/BANDAGES/DRESSINGS) IMPLANT
DRSG MEPILEX FLEX 4X4 (GAUZE/BANDAGES/DRESSINGS) ×3
ELECT BLADE 6.5 EXT (BLADE) ×3 IMPLANT
ELECT REM PT RETURN 9FT ADLT (ELECTROSURGICAL) ×3
ELECTRODE REM PT RTRN 9FT ADLT (ELECTROSURGICAL) ×1 IMPLANT
GAUZE 4X4 16PLY ~~LOC~~+RFID DBL (SPONGE) ×3 IMPLANT
GAUZE SPONGE 4X4 12PLY STRL (GAUZE/BANDAGES/DRESSINGS) ×3 IMPLANT
GLOVE SURG SIGNA 7.5 PF LTX (GLOVE) ×6 IMPLANT
GOWN STRL REUS W/ TWL LRG LVL3 (GOWN DISPOSABLE) ×2 IMPLANT
GOWN STRL REUS W/ TWL XL LVL3 (GOWN DISPOSABLE) ×1 IMPLANT
GOWN STRL REUS W/TWL LRG LVL3 (GOWN DISPOSABLE) ×6
GOWN STRL REUS W/TWL XL LVL3 (GOWN DISPOSABLE) ×3
KIT BASIN OR (CUSTOM PROCEDURE TRAY) ×3 IMPLANT
KIT TURNOVER KIT B (KITS) ×3 IMPLANT
NDL HYPO 25GX1X1/2 BEV (NEEDLE) ×1 IMPLANT
NDL SPNL 18GX3.5 QUINCKE PK (NEEDLE) IMPLANT
NDL SPNL 22GX3.5 QUINCKE BK (NEEDLE) ×1 IMPLANT
NEEDLE HYPO 25GX1X1/2 BEV (NEEDLE) ×3 IMPLANT
NEEDLE SPNL 18GX3.5 QUINCKE PK (NEEDLE) IMPLANT
NEEDLE SPNL 22GX3.5 QUINCKE BK (NEEDLE) ×3 IMPLANT
NS IRRIG 1000ML POUR BTL (IV SOLUTION) ×9 IMPLANT
PACK CHEST (CUSTOM PROCEDURE TRAY) ×3 IMPLANT
PAD ARMBOARD 7.5X6 YLW CONV (MISCELLANEOUS) ×6 IMPLANT
SOL ANTI FOG 6CC (MISCELLANEOUS) ×1 IMPLANT
SOLUTION ANTI FOG 6CC (MISCELLANEOUS) ×2
SPONGE INTESTINAL PEANUT (DISPOSABLE) ×10 IMPLANT
SPONGE T-LAP 18X18 ~~LOC~~+RFID (SPONGE) ×6 IMPLANT
SPONGE TONSIL 1 RF SGL (DISPOSABLE) ×2 IMPLANT
SPONGE TONSIL TAPE 1 RFD (DISPOSABLE) ×3 IMPLANT
SUT SILK  1 MH (SUTURE) ×6
SUT SILK 1 MH (SUTURE) ×2 IMPLANT
SUT VIC AB 2-0 CTX 36 (SUTURE) ×4 IMPLANT
SUT VIC AB 3-0 X1 27 (SUTURE) ×2 IMPLANT
SUT VICRYL 2 TP 1 (SUTURE) IMPLANT
SYR 10ML LL (SYRINGE) IMPLANT
SYR 30ML LL (SYRINGE) ×3 IMPLANT
TAPE CLOTH 4X10 WHT NS (GAUZE/BANDAGES/DRESSINGS) ×3 IMPLANT
TIP APPLICATOR SPRAY EXTEND 16 (VASCULAR PRODUCTS) IMPLANT
TOWEL GREEN STERILE (TOWEL DISPOSABLE) ×3 IMPLANT
TOWEL GREEN STERILE FF (TOWEL DISPOSABLE) ×3 IMPLANT
TROCAR XCEL NON-BLD 11X100MML (ENDOMECHANICALS) ×2 IMPLANT
TROCAR XCEL NON-BLD 5MMX100MML (ENDOMECHANICALS) ×2 IMPLANT
WATER STERILE IRR 1000ML POUR (IV SOLUTION) ×3 IMPLANT

## 2021-09-11 NOTE — Progress Notes (Addendum)
[facility] Physician-Brief Progress Note Patient Name: Lance House DOB: 08/12/1950 MRN: [id number]   Date of Service  09/11/2021  HPI/Events of Note  Notified patient is having chest pain Has oxycodone ordered however NPO for AM procedure Na 129 on salt tabs and 3% at 50 cc/hr  eICU Interventions  Ordered a one time dose of morphine IV Unclear is surgery is to proceed in am as patient reportedly declining     Intervention Category Intermediate Interventions: Electrolyte abnormality - evaluation and management Minor Interventions: Routine modifications to care plan (e.g. PRN medications for pain, fever)  Shona Needles Nivan Melendrez 09/11/2021, 4:01 AM

## 2021-09-11 NOTE — Op Note (Signed)
NAME: BURR, SOFFER MEDICAL RECORD NO: [id number] ACCOUNT NO: [id number] DATE OF BIRTH: 1950-12-21 FACILITY: [facility] LOCATION: [facility] PHYSICIAN: Revonda Standard. Roxan Hockey, MD  Operative Report   DATE OF PROCEDURE: 09/11/2021  PREOPERATIVE DIAGNOSIS:  Left empyema.  POSTOPERATIVE DIAGNOSIS:  Early organizing empyema on the left.  PROCEDURE:   Left video-assisted thoracoscopy,  Drainage of empyema and  Decortication.  SURGEON:  Revonda Standard. Roxan Hockey, MD.  ASSISTANTLars Pinks, PA.   ANESTHESIA:  General.  FINDINGS:  2.5 liters of murky purulent fluid evacuated.  Extensive fibrinous exudate with loculations of fluid and peel on the lung.  Mild parietal pleural peel.  Good reexpansion of the upper and lower lobes post-decortication.  CLINICAL NOTE: Lance House is a 71 year old man who presented to the emergency room at [facility] yesterday with shortness of breath, general malaise and flank pain.  He was noted to be septic.  Workup revealed a large loculated left pleural effusion.  He  was started on broad-spectrum antibiotics and transferred to [facility] for further intervention.  He was advised to undergo left VATS for drainage of the empyema and decortication.  The indications, risks, benefits, and alternatives were discussed  in detail with the patient.  He accepted the risks and agreed to proceed.  OPERATIVE NOTE: Lance House was brought to the operating room on 09/11/2021.  Anesthesia placed an arterial blood pressure monitoring line and established intravenous access.  He was anesthetized and intubated with a double lumen endotracheal tube.  He was  already receiving intravenous vancomycin.  Sequential compression devices were placed on the calves for DVT prophylaxis.  A Foley catheter was placed.  He was placed in a right lateral decubitus position.  A Bair hugger was placed for active warming.  The left chest was prepped and draped in the usual sterile fashion.  Single lung  ventilation of the right lung was initiated.  There was some difficulty with saturations with just ventilation of the right lung and the left lung was intermittently ventilated.  Of note, there were purulent secretions  in copious amounts coming from the left lung and spilling over into the right side.  These were suctioned.    A timeout was performed.  An incision was made in the ninth interspace posterolaterally.  The chest was entered bluntly using a hemostat.  A sucker was placed into the chest and a large amount of murky fluid was evacuated.  A small working incision was  made in the seventh interspace.  This was approximately 3-4 cm in length.  The remainder of the fluid was evacuated, a total of 2.5 liters was removed.  A trocar was placed through the original incision and the scope was inserted.  There were multiple  pockets of fluid that were drained.  The lung was freed up circumferentially.  There was a peel on the parietal pleura, although was relatively mild.  On the visceral pleura, there were areas of fibrinous exudate.  This was most severe along the  diaphragmatic surface of the lower lobe.  The fissure was opened and there was exudate and loculated fluid there as well.  After the fluid was all evacuated, the decortication was begun peeling the exudate from the lower lobe.  Primarily, there was thin  or less adherent exudate on the upper lobe which came off quite easily.  Intermittently having to ventilate for saturations showed that both lobes expanded well.  The parietal pleural exudate was removed as well.  The chest was copiously irrigated with  multiple liters of warm saline.  A test inflation showed good aeration of both the upper and lower lobes.  Two 28 Blake drains were placed.  Dual lung ventilation was resumed.  The chest tubes were placed to a Pleur-Evac on suction.  The working incision  was closed in standard fashion.  The patient was placed back in a supine position.  His  double lumen endotracheal tube was switched out to a single lumen endotracheal tube and he was transported to the postanesthetic care unit, intubated and in stable  condition.  All sponge, needle, and instrument counts were correct at the end of the procedure.  Experienced assistance was necessary for this case due to complexity.  Lars Pinks, PA performed that role.  She provided camera management for visualization, suctioning, retraction, and assistance with making and closing incisions.  CHR D: 09/11/2021 5:44:43 pm T: 09/11/2021 10:38:00 pm  JOB: [id number]/ [id number]

## 2021-09-11 NOTE — Progress Notes (Signed)
NAME:  Lance House, MRN:  [id number], DOB:  [birth year], LOS: 1 ADMISSION DATE:  09/10/2021, CONSULTATION DATE:  09/10/21 REFERRING MD:  Jari Pigg- EDP, CHIEF COMPLAINT:  loculated pleural effusion   History of Present Illness:  Lance House is a 71 y/o gentleman with a history of former tobacco abuse, ETOH abuse with recent admission for traumatic ICH after a fall who presented to the ED on 2/19 with complaints of intermittent SOB, weakness, left-sided chest pain that has worsened over the past several weeks. He decided to come to the ED from home because his pain never improved. When EMS arrived he was hypoxic to 86% on RA.   He was recently admitted to the hospital from 1/24- 2/2 for an ICH and compression fractures suffered during a fall while intoxicated. He was discharged to [facility] and was brought back to the ED within about 2 hours, where he remained admitted in the ED from 2/2- 2/13 due to placement issues. He has chronic pain for which he takes oxycodone. He was transferred from [facility] ED to [facility] for TCTS evaluation with tentative plans for VATS (Video-assisted thoracoscopic surgery) with Dr. Roxan Hockey.   Pertinent  Medical History  ETOH abuse- quit 3 months ago Tobacco abuse- quit recently Gout Depression Compression fractures- thoracic and lumbar ICH- Feb 2023  Significant Hospital Events: Including procedures, antibiotic start and stop dates in addition to other pertinent events   2/19 admitted  Interim History / Subjective:  Overnight positive for chest pain on left side received morphine 2mg  IV at 0418 which did not decrease pain.  Pain across back  Na 136 from 129 in 6 hours, discontinued 3% saline  Objective   Blood pressure 116/70, pulse (!) 120, temperature 98.3 F (36.8 C), temperature source Axillary, resp. rate (!) 24, weight 78.7 kg, SpO2 92 %.        Intake/Output Summary (Last 24 hours) at 09/11/2021 0809 Last data filed at 09/11/2021 0700 Gross per 24  hour  Intake 1048.33 ml  Output 675 ml  Net 373.33 ml   Filed Weights   09/11/21 0500  Weight: 78.7 kg   Vitals last 24 hours:  T 97.5-98.7  PR 95-123 tachycardic  RR 11-40  tachypneic   SBP 88-135  DBP 65-92  SpO2 77-99 % on 15 L/min   Examination: General: chronically ill appearing man lying in bed in NAD HENT: [place]/AT, eyes anicteric Lungs: rhonchi bilaterally, reduced left lateral breath sounds Cardiovascular: S1S2, mildly tachycardic, reg rhythm Abdomen: soft, NT Extremities: chronic stasis dermatitis, no LE edema, no cyanosis.  Derm: multiple scabs and minor wounds on his feet Neuro: awake, alert, answering questions appropriately, globally weak, moving all extremities  Labs:  Na 136  K 3.4 Creatinine 0.58  Corrected Calcium 7.5  WBC leukocytosis 28.2 H/H 14/41  Images: 2/19 CTA Chest:  large left loculated effusion, emphysema. Possible fibrosis vs emphysema with interstitial thickening due to pneumonia. No signs of pulmonary embolism.   2/19 CT of head showed no acute intracranial pathology. Unchanged nodule in posterior right parietal lobe that could be further evaluated by contrast enhanced MRI.  2/19 CT of cervical spine showed no acute intracranial pathology.  2/19 x-ray of left knee and hip and pelvis were negative.   Resolved Hospital Problem list   Lactic acidosis Hyponatremia   Assessment & Plan:  Acute respiratory failure with hypoxia Empyema -supplemental O2 to maintain SpO2 >90%; can wean O2 for now -con't broad antibiotics cefepime, metronidazole, vanc -TCTS planning  for VATS  -pulmonary hygiene -mucinex -bronchodilators PRN; not on BD as OP. Needs OP PFTs -oxycodone PRN for pain; has long history of opiate use -Ketorolac 15 mg q8h for pain PRN   Sepsis due to pneumonia- associated respiratory failure and AKI -Continue to monitor blood cultures  -Con't broad antibiotics- cefepime, metronidazole, vanc -Need intrapleural cultures -Needs  definitive source control; plan is for VATS but can perform tube thoracostomy if decompensates before that.   H/o ETOH abuse -monitor for signs of withdrawal -vitamins  Mild protein energy malnutrition -vitamins -RD consult  Hypokalemia  K 3.4  - Replete with goal K > 4   Hypocalcemia  - Replete calcemia   Mild AKI -renally dose meds, avoid nephrotoxic meds -strict I/Os -monitor  Recent ICH, compression fractures, rhabdo 2/19 CT of head showed no acute intracranial pathology. Unchanged nodule in posterior right parietal lobe that could be further evaluated by contrast enhanced MRI. 2/19 CT of cervical spine showed no acute intracranial pathology. 2/19 x-ray of left knee and hip and pelvis were negative.  -supportive care  Wife would be surrogate decision maker.  Best Practice (right click and "Reselect all SmartList Selections" daily)   Diet/type: Regular consistency (see orders), NPO p mn DVT prophylaxis: prophylactic heparin  GI prophylaxis: N/A Lines: N/A Foley:  N/A Code Status:  full code Last date of multidisciplinary goals of care discussion [  ]  Labs   CBC: Recent Labs  Lab 09/04/21 1001 09/10/21 1104 09/11/21 0159  WBC 10.8* 30.8* 28.2*  NEUTROABS 8.2* 28.1*  --   HGB 13.8 15.6 14.0  HCT 41.8 45.3 41.1  MCV 96.8 89.9 91.7  PLT 481* 384 562    Basic Metabolic Panel: Recent Labs  Lab 09/04/21 1001 09/10/21 1104 09/10/21 1944 09/10/21 2346 09/11/21 0159 09/11/21 0452  NA 128* 127* 129* 129*  --  136  K 4.0 3.5 3.7 3.7  --  3.4*  CL 93* 89* 97* 97*  --  108  CO2 30 28 22 23   --  19*  GLUCOSE 98 103* 108* 100*  --  78  BUN <5* 15 13 13   --  11  CREATININE 0.42* 0.72 0.70 0.59*  --  0.58*  CALCIUM 8.6* 8.2* 7.8* 7.7*  --  6.3*  MG  --  1.7  --   --  1.8  --   PHOS  --   --   --   --  4.3  --    GFR: Estimated Creatinine Clearance: 98.4 mL/min (A) (by C-G formula based on SCr of 0.58 mg/dL (L)). Recent Labs  Lab 09/04/21 1001  09/10/21 1104 09/10/21 1324 09/11/21 0159  PROCALCITON  --  0.82  --   --   WBC 10.8* 30.8*  --  28.2*  LATICACIDVEN  --  2.1* 1.4  --     Liver Function Tests: Recent Labs  Lab 09/04/21 1001 09/10/21 1104  AST 15 25  ALT 11 20  ALKPHOS 105 84  BILITOT 0.7 1.1  PROT 6.5 6.0*  ALBUMIN 2.9* 2.5*   No results for input(s): LIPASE, AMYLASE in the last 168 hours. Recent Labs  Lab 09/10/21 1104  AMMONIA 13    ABG    Component Value Date/Time   HCO3 28.5 (H) 09/10/2021 1634   O2SAT 81.5 09/10/2021 1634     Coagulation Profile: Recent Labs  Lab 09/10/21 2346  INR 1.4*    Cardiac Enzymes: Recent Labs  Lab 09/04/21 1001 09/10/21 1104  CKTOTAL 59  135    HbA1C: No results found for: HGBA1C  CBG: Recent Labs  Lab 09/10/21 1928 09/10/21 2339 09/11/21 0307  GLUCAP 108* 107* 87

## 2021-09-11 NOTE — Anesthesia Postprocedure Evaluation (Signed)
Anesthesia Post Note  Patient: [name]  Procedure(s) Performed: VIDEO ASSISTED THORACOSCOPY (VATS)/DECORTICATION (Left: Chest)     Patient location during evaluation: ICU Anesthesia Type: General Level of consciousness: sedated and patient remains intubated per anesthesia plan Pain management: pain level controlled Vital Signs Assessment: post-procedure vital signs reviewed and stable Respiratory status: patient remains intubated per anesthesia plan Cardiovascular status: stable Postop Assessment: no apparent nausea or vomiting Anesthetic complications: no   No notable events documented.  Last Vitals:  Vitals:   09/11/21 1536 09/11/21 1555  BP:    Pulse:    Resp:    Temp:    SpO2: 100% 97%    Last Pain:  Vitals:   09/11/21 0800  TempSrc: Axillary  PainSc:                  Audry Pili

## 2021-09-11 NOTE — Interval H&P Note (Signed)
History and Physical Interval Note:  Patient seen and examined, no interval change Abdomen is benign  09/11/2021 12:02 PM  Lance House  has presented today for surgery, with the diagnosis of Left Empyema.  The various methods of treatment have been discussed with the patient and family. After consideration of risks, benefits and other options for treatment, the patient has consented to  Procedure(s): VIDEO ASSISTED THORACOSCOPY (VATS)/DECORTICATION (Left) as a surgical intervention.  The patient's history has been reviewed, patient examined, no change in status, stable for surgery.  I have reviewed the patient's chart and labs.  Questions were answered to the patient's satisfaction.     Melrose Nakayama

## 2021-09-11 NOTE — Transfer of Care (Signed)
Immediate Anesthesia Transfer of Care Note  Patient: Lance House  Procedure(s) Performed: VIDEO ASSISTED THORACOSCOPY (VATS)/DECORTICATION (Left: Chest)  Patient Location: ICU  Anesthesia Type:General  Level of Consciousness: Patient remains intubated per anesthesia plan  Airway & Oxygen Therapy: Patient remains intubated per anesthesia plan and Patient placed on Ventilator (see vital sign flow sheet for setting)  Post-op Assessment: Report given to RN, Post -op Vital signs reviewed and stable and Patient moving all extremities  Post vital signs: Reviewed and stable  Last Vitals:  Vitals Value Taken Time  BP    Temp    Pulse    Resp 18 09/11/21 1534  SpO2 100 % 09/11/21 1534  Vitals shown include unvalidated device data.  Last Pain:  Vitals:   09/11/21 0800  TempSrc: Axillary  PainSc:       Patients Stated Pain Goal: 0 (74/16/[id number])  Complications: No notable events documented.

## 2021-09-11 NOTE — Anesthesia Procedure Notes (Signed)
Procedure Name: Intubation Date/Time: 09/11/2021 3:10 PM Performed by: Moshe Salisbury, CRNA Pre-anesthesia Checklist: Patient identified, Emergency Drugs available, Suction available and Patient being monitored Patient Re-evaluated:Patient Re-evaluated prior to induction Oxygen Delivery Method: Circle System Utilized Preoxygenation: Pre-oxygenation with 100% oxygen Laryngoscope Size: Mac and 4 Grade View: Grade I Tube type: Oral Tube size: 8.0 mm Number of attempts: 1 Airway Equipment and Method: Stylet Placement Confirmation: ETT inserted through vocal cords under direct vision, positive ETCO2 and breath sounds checked- equal and bilateral Secured at: 23 cm Tube secured with: Tape Dental Injury: Teeth and Oropharynx as per pre-operative assessment

## 2021-09-11 NOTE — Brief Op Note (Addendum)
09/10/2021 - 09/11/2021  2:54 PM  PATIENT:  [name]  71 y.o. male  PRE-OPERATIVE DIAGNOSIS:  Left Empyema  POST-OPERATIVE DIAGNOSIS:  Left Empyema  PROCEDURE: LEFT VIDEO ASSISTED THORACOSCOPY (VATS),  DRAINAGE of LEFT EMPYEMA, and  DECORTICATION   Findings: 2500 cc of purulent drainage removed, extensive fibrinous exudate  SURGEON:  Surgeon(s) and Role:    Melrose Nakayama, MD - Primary  PHYSICIAN ASSISTANT: Lars Pinks PA-C  ANESTHESIA:   general  EBL:  200 mL   BLOOD ADMINISTERED:none  DRAINS:  2 28 Blake drains placed in the left pleural space     SPECIMEN:  Source of Specimen:  Left  pleural peel and left pleural fluid  DISPOSITION OF SPECIMEN:   Pathology and culture  COUNTS CORRECT  YES  DICTATION: .Dragon Dictation  PLAN OF CARE: Admit to inpatient   PATIENT DISPOSITION:  PACU - hemodynamically stable.   Delay start of Pharmacological VTE agent (>24hrs) due to surgical blood loss or risk of bleeding: no

## 2021-09-11 NOTE — Progress Notes (Signed)
US guided PIV placed 22g 1.75 in right lateral forearm for vasopressors.  Ovid Curd, RN aware that IV is for this purpose and to place IV watch once vasopressor is started in this IV.  "X" marked in indelible ink at tip of catheter.

## 2021-09-11 NOTE — Progress Notes (Signed)
eLink Physician-Brief Progress Note Patient Name: Lance House DOB: 1951-05-04 MRN: [id number]   Date of Service  09/11/2021  HPI/Events of Note  Na 136 from 129 in 6 hours  eICU Interventions  Will discontinue 3% saline and continue to monitor     Intervention Category Intermediate Interventions: Electrolyte abnormality - evaluation and management  Shona Needles Markale Birdsell 09/11/2021, 6:18 AM

## 2021-09-11 NOTE — Progress Notes (Signed)
[facility] ADULT ICU REPLACEMENT PROTOCOL   The patient does apply for the [facility] Adult ICU Electrolyte Replacment Protocol based on the criteria listed below:   1.Exclusion criteria: TCTS patients, ECMO patients, and Dialysis patients 2. Is GFR >/= 30 ml/min? Yes.    Patient's GFR today is >60 3. Is SCr </= 2? Yes.   Patient's SCr is 0.58 mg/dL 4. Did SCr increase >/= 0.5 in 24 hours? No. 5.Pt's weight >40kg  Yes.   6. Abnormal electrolyte(s): K  7. Electrolytes replaced per protocol 8.  Call MD STAT for K+ </= 2.5, Phos </= 1, or Mag </= 1 Physician:  Joette Catching [facility] 09/11/2021 6:08 AM

## 2021-09-11 NOTE — Anesthesia Preprocedure Evaluation (Addendum)
Anesthesia Evaluation  Patient identified by MRN, date of birth, ID band Patient awake    Reviewed: Allergy & Precautions, NPO status , Patient's Chart, lab work & pertinent test results  History of Anesthesia Complications Negative for: history of anesthetic complications  Airway Mallampati: III       Dental  (+) Poor Dentition, Missing   Pulmonary shortness of breath, pneumonia, Patient abstained from smoking., former smoker,   CT Angio Chest - IMPRESSION: 1. Negative examination for pulmonary embolism. 2. Large, loculated appearing left pleural effusion with near-total atelectasis of the left lung. 3. Subacute appearing, partially callused fractures of the posterolateral left ninth and tenth ribs. 4. Evaluation of the aerated portions of the right lung is limited by breath motion artifact, however within this limitation, there is dependent heterogeneous airspace opacity superimposed upon emphysema. Findings are consistent with infection or aspiration. 5. Coronary artery disease.     + decreased breath sounds (Left side)      Cardiovascular negative cardio ROS   Rhythm:Regular Rate:Tachycardia  ECG: ST, rate 118   Neuro/Psych PSYCHIATRIC DISORDERS Anxiety Depression  Hx ICH   Neuromuscular disease    GI/Hepatic negative GI ROS, (+)     substance abuse  alcohol use,   Endo/Other   Ca 6.3   Renal/GU negative Renal ROS     Musculoskeletal  (+) Arthritis ,  Gout    Abdominal   Peds  Hematology negative hematology ROS (+)   Anesthesia Other Findings Left Empyema  Reproductive/Obstetrics                            Anesthesia Physical Anesthesia Plan  ASA: 4  Anesthesia Plan: General   Post-op Pain Management:    Induction: Intravenous  PONV Risk Score and Plan: 2 and Treatment may vary due to age or medical condition, Ondansetron, Dexamethasone and Midazolam  Airway  Management Planned: Double Lumen EBT  Additional Equipment: Arterial line  Intra-op Plan:   Post-operative Plan: Extubation in OR  Informed Consent: I have reviewed the patients History and Physical, chart, labs and discussed the procedure including the risks, benefits and alternatives for the proposed anesthesia with the patient or authorized representative who has indicated his/her understanding and acceptance.     Dental advisory given  Plan Discussed with: CRNA, Anesthesiologist and Surgeon  Anesthesia Plan Comments:        Anesthesia Quick Evaluation

## 2021-09-11 NOTE — Anesthesia Procedure Notes (Signed)
Arterial Line Insertion Start/End2/20/2023 12:45 PM Performed by: Carolan Clines, CRNA, CRNA  Patient location: Pre-op. Preanesthetic checklist: patient identified, IV checked, site marked, risks and benefits discussed, surgical consent, monitors and equipment checked, pre-op evaluation, timeout performed and anesthesia consent Lidocaine 1% used for infiltration Right, radial was placed Catheter size: 20 G Hand hygiene performed  and maximum sterile barriers used   Attempts: 1 Procedure performed without using ultrasound guided technique. Following insertion, dressing applied and Biopatch. Post procedure assessment: normal and unchanged  Patient tolerated the procedure well with no immediate complications.

## 2021-09-11 NOTE — Progress Notes (Signed)
[place] Progress Note Patient Name: Lance House DOB: 1950/11/05 MRN: [id number]   Date of Service  09/11/2021  HPI/Events of Note  Urinary retention - Bladder scan with > 400 mL residual.   eICU Interventions  Plan: I/O Cath PRN.      Intervention Category Major Interventions: Other:  Lysle Dingwall 09/11/2021, 9:50 PM

## 2021-09-11 NOTE — Progress Notes (Deleted)
[place] Progress Note Patient Name: Lance House DOB: 08/28/1950 MRN: [id number]   Date of Service  09/11/2021  HPI/Events of Note  Hypotension - [facility] request for order for Cardiac Echo.  eICU Interventions  Plan: Will order Cardiac Echo STAT.  0.45 NaCl 1 liter IV over 1 hour now.      Intervention Category Major Interventions: Hypotension - evaluation and management  [name] 09/11/2021, 10:17 PM

## 2021-09-11 NOTE — Anesthesia Procedure Notes (Signed)
Procedure Name: Intubation Date/Time: 09/11/2021 12:47 PM Performed by: Bryson Corona, CRNA Pre-anesthesia Checklist: Patient identified, Emergency Drugs available, Suction available and Patient being monitored Patient Re-evaluated:Patient Re-evaluated prior to induction Oxygen Delivery Method: Circle System Utilized Preoxygenation: Pre-oxygenation with 100% oxygen Induction Type: IV induction Ventilation: Mask ventilation without difficulty Laryngoscope Size: Mac and 4 Grade View: Grade I Tube type: Oral Endobronchial tube: Left, EBT position confirmed by auscultation, Double lumen EBT and EBT position confirmed by fiberoptic bronchoscope and 39 Fr Number of attempts: 1 Airway Equipment and Method: Stylet and Oral airway Placement Confirmation: ETT inserted through vocal cords under direct vision, positive ETCO2 and breath sounds checked- equal and bilateral Tube secured with: Tape Dental Injury: Teeth and Oropharynx as per pre-operative assessment

## 2021-09-11 NOTE — Progress Notes (Addendum)
Patient declined to sign consent for VATS/decortication, states he did not know he was scheduled for a procedure tomorrow. Vonzell Schlatter RN  [id number] 2/20 update: Pt has remained NPO through the night. He states he would be agreeable to necessary procedure, he does request a review of information concerning VATS. Vonzell Schlatter RN

## 2021-09-12 ENCOUNTER — Inpatient Hospital Stay (HOSPITAL_COMMUNITY): Payer: Medicare HMO

## 2021-09-12 ENCOUNTER — Encounter (HOSPITAL_COMMUNITY): Payer: Self-pay | Admitting: Thoracic Surgery (Cardiothoracic Vascular Surgery)

## 2021-09-12 DIAGNOSIS — J869 Pyothorax without fistula: Secondary | ICD-10-CM | POA: Diagnosis not present

## 2021-09-12 DIAGNOSIS — R6521 Severe sepsis with septic shock: Secondary | ICD-10-CM

## 2021-09-12 DIAGNOSIS — J9601 Acute respiratory failure with hypoxia: Secondary | ICD-10-CM | POA: Diagnosis not present

## 2021-09-12 DIAGNOSIS — A419 Sepsis, unspecified organism: Secondary | ICD-10-CM | POA: Diagnosis not present

## 2021-09-12 LAB — GLUCOSE, CAPILLARY
Glucose-Capillary: 104 mg/dL — ABNORMAL HIGH (ref 70–99)
Glucose-Capillary: 116 mg/dL — ABNORMAL HIGH (ref 70–99)
Glucose-Capillary: 127 mg/dL — ABNORMAL HIGH (ref 70–99)
Glucose-Capillary: 128 mg/dL — ABNORMAL HIGH (ref 70–99)
Glucose-Capillary: 134 mg/dL — ABNORMAL HIGH (ref 70–99)
Glucose-Capillary: 144 mg/dL — ABNORMAL HIGH (ref 70–99)
Glucose-Capillary: 98 mg/dL (ref 70–99)

## 2021-09-12 LAB — CBC WITH DIFFERENTIAL/PLATELET
Abs Immature Granulocytes: 0.3 10*3/uL — ABNORMAL HIGH (ref 0.00–0.07)
Basophils Absolute: 0.1 10*3/uL (ref 0.0–0.1)
Basophils Relative: 0 %
Eosinophils Absolute: 0.1 10*3/uL (ref 0.0–0.5)
Eosinophils Relative: 0 %
HCT: 39.2 % (ref 39.0–52.0)
Hemoglobin: 13.1 g/dL (ref 13.0–17.0)
Immature Granulocytes: 1 %
Lymphocytes Relative: 3 %
Lymphs Abs: 0.6 10*3/uL — ABNORMAL LOW (ref 0.7–4.0)
MCH: 32 pg (ref 26.0–34.0)
MCHC: 33.4 g/dL (ref 30.0–36.0)
MCV: 95.6 fL (ref 80.0–100.0)
Monocytes Absolute: 0.6 10*3/uL (ref 0.1–1.0)
Monocytes Relative: 3 %
Neutro Abs: 21.2 10*3/uL — ABNORMAL HIGH (ref 1.7–7.7)
Neutrophils Relative %: 93 %
Platelets: 353 10*3/uL (ref 150–400)
RBC: 4.1 MIL/uL — ABNORMAL LOW (ref 4.22–5.81)
RDW: 14.2 % (ref 11.5–15.5)
WBC: 22.9 10*3/uL — ABNORMAL HIGH (ref 4.0–10.5)
nRBC: 0 % (ref 0.0–0.2)

## 2021-09-12 LAB — ACID FAST SMEAR (AFB, MYCOBACTERIA): Acid Fast Smear: NEGATIVE

## 2021-09-12 LAB — COMPREHENSIVE METABOLIC PANEL
ALT: 15 U/L (ref 0–44)
AST: 19 U/L (ref 15–41)
Albumin: 1.6 g/dL — ABNORMAL LOW (ref 3.5–5.0)
Alkaline Phosphatase: 68 U/L (ref 38–126)
Anion gap: 8 (ref 5–15)
BUN: 15 mg/dL (ref 8–23)
CO2: 20 mmol/L — ABNORMAL LOW (ref 22–32)
Calcium: 7.7 mg/dL — ABNORMAL LOW (ref 8.9–10.3)
Chloride: 105 mmol/L (ref 98–111)
Creatinine, Ser: 0.64 mg/dL (ref 0.61–1.24)
GFR, Estimated: >60 mL/min (ref >60)
Glucose, Bld: 127 mg/dL — ABNORMAL HIGH (ref 70–99)
Potassium: 4.8 mmol/L (ref 3.5–5.1)
Sodium: 133 mmol/L — ABNORMAL LOW (ref 135–145)
Total Bilirubin: 0.8 mg/dL (ref 0.3–1.2)
Total Protein: 4.4 g/dL — ABNORMAL LOW (ref 6.5–8.1)

## 2021-09-12 LAB — TRIGLYCERIDES: Triglycerides: 51 mg/dL (ref <150)

## 2021-09-12 LAB — PHOSPHORUS: Phosphorus: 3.6 mg/dL (ref 2.5–4.6)

## 2021-09-12 LAB — VANCOMYCIN, PEAK: Vancomycin Pk: 37 ug/mL (ref 30–40)

## 2021-09-12 LAB — MAGNESIUM: Magnesium: 2.1 mg/dL (ref 1.7–2.4)

## 2021-09-12 IMAGING — DX DG CHEST 1V PORT
1 series · 1 of 1 positions shown · non-contrast
Comparison: One-view chest x-ray [DATE]. CTA chest [DATE].

CLINICAL DATA: Encounter for pneumothorax.

EXAM:
PORTABLE CHEST 1 VIEW

[chest ap]
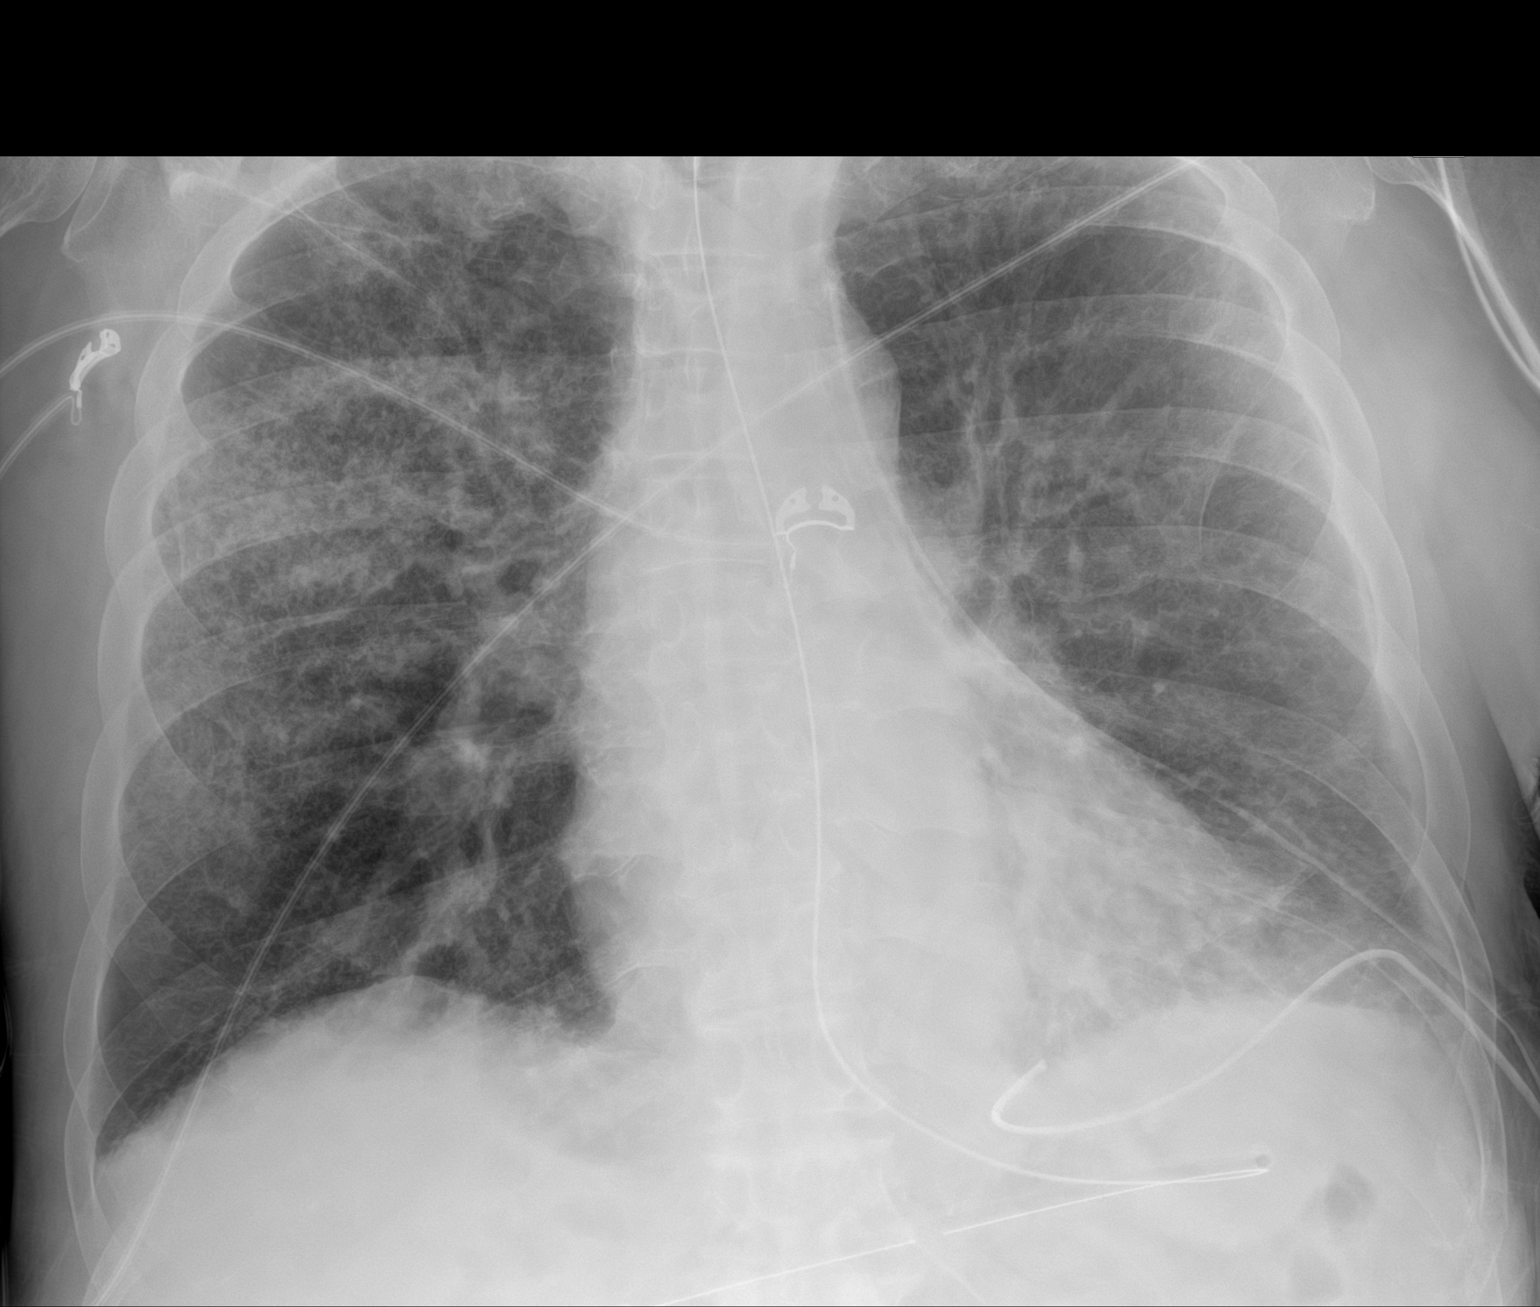

[1 of 1 positions shown; findings below may reference images not displayed]

FINDINGS: A left-sided chest tube remains in place. Minimal left apical
pneumothorax is present. Small left effusion and basilar atelectasis
is present.

Endotracheal tube and NG tube are stable.

Asymmetric right-sided airspace disease is again noted. The right
pleural effusion and basilar atelectasis is improved.
IMPRESSION: 1. Minimal left apical pneumothorax with left-sided chest tube in
place.
2. Small left pleural effusion and basilar atelectasis.
3. Improved right pleural effusion and basilar atelectasis.
4. Persistent and increasing right upper lobe airspace opacities
concerning for asymmetric edema or infection.

## 2021-09-12 MED ORDER — DOCUSATE SODIUM 100 MG PO CAPS
100.0000 mg | ORAL_CAPSULE | Freq: Two times a day (BID) | ORAL | Status: DC
  Administered 2021-09-12 – 2021-09-14 (×5): 100 mg via ORAL
  Filled 2021-09-12 (×19): qty 1

## 2021-09-12 MED ORDER — LIDOCAINE 5 % EX PTCH
1.0000 | MEDICATED_PATCH | CUTANEOUS | Status: DC
  Administered 2021-09-12 – 2021-09-26 (×7): 1 via TRANSDERMAL
  Filled 2021-09-12 (×9): qty 1

## 2021-09-12 MED ORDER — ADULT MULTIVITAMIN W/MINERALS CH
1.0000 | ORAL_TABLET | Freq: Every day | ORAL | Status: DC
  Administered 2021-09-13 – 2021-09-25 (×10): 1 via ORAL
  Filled 2021-09-12 (×13): qty 1

## 2021-09-12 MED ORDER — FOLIC ACID 1 MG PO TABS
1.0000 mg | ORAL_TABLET | Freq: Every day | ORAL | Status: DC
  Administered 2021-09-13 – 2021-09-25 (×10): 1 mg via ORAL
  Filled 2021-09-12 (×13): qty 1

## 2021-09-12 MED ORDER — BETHANECHOL CHLORIDE 5 MG PO TABS
5.0000 mg | ORAL_TABLET | Freq: Three times a day (TID) | ORAL | Status: DC
  Administered 2021-09-12: 5 mg
  Filled 2021-09-12 (×3): qty 1

## 2021-09-12 MED ORDER — THIAMINE HCL 100 MG PO TABS
100.0000 mg | ORAL_TABLET | Freq: Every day | ORAL | Status: DC
  Administered 2021-09-13 – 2021-09-25 (×9): 100 mg via ORAL
  Filled 2021-09-12 (×13): qty 1

## 2021-09-12 MED ORDER — ORAL CARE MOUTH RINSE: 15.0000 mL | Freq: Two times a day (BID) | OROMUCOSAL | Status: DC

## 2021-09-12 MED ORDER — OXYCODONE HCL 5 MG PO TABS
5.0000 mg | ORAL_TABLET | Freq: Four times a day (QID) | ORAL | Status: DC | PRN
  Administered 2021-09-12 (×2): 5 mg via ORAL
  Filled 2021-09-12 (×2): qty 1

## 2021-09-12 MED ORDER — BETHANECHOL CHLORIDE 5 MG PO TABS
5.0000 mg | ORAL_TABLET | Freq: Three times a day (TID) | ORAL | Status: DC
  Administered 2021-09-12 – 2021-09-26 (×14): 5 mg via ORAL
  Filled 2021-09-12 (×46): qty 1

## 2021-09-12 MED ORDER — DOCUSATE SODIUM 50 MG/5ML PO LIQD: 100.0000 mg | Freq: Two times a day (BID) | ORAL | Status: DC

## 2021-09-12 MED ORDER — BETHANECHOL CHLORIDE 5 MG PO TABS
5.0000 mg | ORAL_TABLET | Freq: Three times a day (TID) | ORAL | Status: DC
  Filled 2021-09-12 (×2): qty 1

## 2021-09-12 MED ORDER — SENNOSIDES-DOCUSATE SODIUM 8.6-50 MG PO TABS
1.0000 | ORAL_TABLET | Freq: Every day | ORAL | Status: DC
  Administered 2021-09-12 – 2021-09-14 (×3): 1 via ORAL
  Filled 2021-09-12 (×12): qty 1

## 2021-09-12 NOTE — Procedures (Signed)
Extubation Procedure Note  Patient Details:   Name: Needham Biggins DOB: 07-Jan-1951 MRN: [id number]   Airway Documentation:    Vent end date: 09/12/21 Vent end time: 1016   Evaluation  O2 sats: stable throughout Complications: No apparent complications Patient did tolerate procedure well. Bilateral Breath Sounds: Clear, Diminished, Rhonchi   Yes  Patient extubated per order to 4L [place] with no apparent complications. Positive cuff leak was noted prior to extubation. Patient is alert and oriented, has a strong cough, and is able to speak. Vitals are stable. RT will continue to monitor.   Eliz Nigg Clyda Greener 09/12/2021, 10:28 AM

## 2021-09-12 NOTE — Progress Notes (Signed)
°   09/12/21 1500  Clinical Encounter Type  Visited With Patient  Visit Type Initial  Referral From Nurse  Consult/Referral To Chaplain   Unit secretary requested that Chaplain speak with patient who had a procedure today. Patient indicated that he felt nauseous and needed nurse. Chaplain will revisit after patient feels better.  Melody Haver, Resident Chaplain [phone]

## 2021-09-12 NOTE — Progress Notes (Signed)
NAME:  Lance House, MRN:  [id number], DOB:  [birth year], LOS: 2 ADMISSION DATE:  09/10/2021, CONSULTATION DATE:  09/10/21 REFERRING MD:  Jari Pigg- EDP, CHIEF COMPLAINT:  loculated pleural effusion   History of Present Illness:  Lance House is a 71 y/o gentleman with a history of former tobacco abuse, ETOH abuse with recent admission for traumatic ICH after a fall who presented to the ED on 2/19 with complaints of intermittent SOB, weakness, left-sided chest pain that worsened over the past several weeks. Consistent with left sided empyema.   He decided to come to the ED from home because his pain never improved. When EMS arrived he was hypoxic to 86% on RA. He was recently admitted to the hospital from 1/24- 2/2 for an ICH and compression fractures suffered during a fall while intoxicated. He was discharged to [facility] and was brought back to the ED within about 2 hours, where he remained admitted in the ED from 2/2- 2/13 due to placement issues. He has chronic pain for which he takes oxycodone. He was transferred from [facility] ED to [facility] for TCTS evaluation with tentative plans for VATS (Video-assisted thoracoscopic surgery) with Dr. Roxan Hockey.   Pertinent  Medical History  ETOH abuse- quit 3 months ago Tobacco abuse- quit recently Gout Depression Compression fractures- thoracic and lumbar ICH- Feb 2023  Significant Hospital Events: Including procedures, antibiotic start and stop dates in addition to other pertinent events   2/19 admitted 2/20 TCTS performed VATS   Interim History / Subjective:  POD1 VATS  Urinary retention with bladder scan showing > 400 ml residual overnight  intubated and sedated, on 4 mcg of NE  Chest tube OG tube   Objective   Blood pressure 95/65, pulse 79, temperature 97.8 F (36.6 C), temperature source Oral, resp. rate (!) 24, weight 80.1 kg, SpO2 91 %.    Vent Mode: PRVC FiO2 (%):  [50 %-100 %] 50 % Set Rate:  [18 bmp-24 bmp] 24 bmp Vt Set:  [640  mL] 640 mL PEEP:  [5 cmH20] 5 cmH20 Plateau Pressure:  [16 cmH20-21 cmH20] 21 cmH20   Intake/Output Summary (Last 24 hours) at 09/12/2021 0703 Last data filed at 09/12/2021 0615 Gross per 24 hour  Intake 4959.14 ml  Output 1055 ml  Net 3904.14 ml   Filed Weights   09/11/21 0500 09/12/21 0500  Weight: 78.7 kg 80.1 kg   Vitals last 24 hours:  T 96.3-99.5 PR 64-115 RR 31   SBP 95-130 DBP 52- 88  Examination: General: chronically ill appearing man lying in bed in NAD HENT: /AT, eyes anicteric, OG in place  Lungs: chest tube draining on left side  Cardiovascular: S1S2, mildly tachycardic, reg rhythm Abdomen: soft, NT Extremities: chronic stasis dermatitis, no LE edema, no cyanosis.  Derm: multiple scabs and minor wounds on his feet Neuro: awake, globally weak, moving all extremities, following commands  Labs:  Na 133 K 4.8 Creatinine 0.64 Corrected Calcium 9.6  Mg 2.1 Phosphorous 3.6  WBC leukocytosis 22.9 H/H 13/39  Images: 2/19 CTA Chest:  large left loculated effusion, emphysema. Possible fibrosis vs emphysema with interstitial thickening due to pneumonia. No signs of pulmonary embolism.   2/19 CT of head showed no acute intracranial pathology. Unchanged nodule in posterior right parietal lobe that could be further evaluated by contrast enhanced MRI.  2/19 CT of cervical spine showed no acute intracranial pathology.  2/19 x-ray of left knee and hip and pelvis were negative.   Resolved Hospital Problem  list   Lactic acidosis Hypocalcemia  Mild AKI  Assessment & Plan:  Septic shock due to left-sided pneumonia  Left sided Empyema -con't broad antibiotics cefepime, metronidazole, vanc -Follow up on intrapleural cultures -Oxycodone PRN for pain; has long history of opiate use -Acetaminophen 650 mg q6h PRN   Acute respiratory failure with hypoxia Will try to extubate patient today.  - PAD protocol  - VAP protocol  - Daily SBT and SAT  - pulmonary  hygiene - mucinex - bronchodilators PRN; not on BD as OP. Needs OP PFTs  Urinary retention with bladder scan showing > 400 ml residual overnight  - Initiate Bethanechol  - Monitor I/O  Hyponatremia  133  - Monitor   Type II MI due to demand cardiac ischemia  Down trending troponin.  Last echo on 08/18/21 showed grade I diastolic dysfunction.   H/o ETOH abuse No signs of withdrawal -vitamins - d/c ativan prn   Mild protein energy malnutrition -vitamins -RD consult  Recent ICH, compression fractures, rhabdo 2/19 CT of head showed no acute intracranial pathology. Unchanged nodule in posterior right parietal lobe that could be further evaluated by contrast enhanced MRI. 2/19 CT of cervical spine showed no acute intracranial pathology. 2/19 x-ray of left knee and hip and pelvis were negative.  -supportive care  Wife would be surrogate decision maker.  Best Practice (right click and "Reselect all SmartList Selections" daily)   Diet/type: tubefeeds DVT prophylaxis: prophylactic heparin  GI prophylaxis: PPI Lines: Arterial Line Foley:  N/A Code Status:  full code Last date of multidisciplinary goals of care discussion [  ]  Labs   CBC: Recent Labs  Lab 09/10/21 1104 09/11/21 0159 09/11/21 1358 09/11/21 1636 09/11/21 1644 09/12/21 0239  WBC 30.8* 28.2*  --   --  24.3* 22.9*  NEUTROABS 28.1*  --   --   --  21.7* 21.2*  HGB 15.6 14.0 14.6 14.3 13.7 13.1  HCT 45.3 41.1 43.0 42.0 42.5 39.2  MCV 89.9 91.7  --   --  96.6 95.6  PLT 384 332  --   --  348 161    Basic Metabolic Panel: Recent Labs  Lab 09/10/21 1104 09/10/21 1944 09/10/21 2346 09/11/21 0159 09/11/21 0452 09/11/21 1358 09/11/21 1636 09/11/21 1644 09/12/21 0239  NA 127* 129* 129*  --  136 136 135 136 133*  K 3.5 3.7 3.7  --  3.4* 4.5 4.6 4.7 4.8  CL 89* 97* 97*  --  108  --   --  104 105  CO2 28 22 23   --  19*  --   --  24 20*  GLUCOSE 103* 108* 100*  --  78  --   --  114* 127*  BUN 15 13 13   --   11  --   --  12 15  CREATININE 0.72 0.70 0.59*  --  0.58*  --   --  0.67 0.64  CALCIUM 8.2* 7.8* 7.7*  --  6.3*  --   --  8.1* 7.7*  MG 1.7  --   --  1.8  --   --   --  2.3 2.1  PHOS  --   --   --  4.3  --   --   --  4.6 3.6   GFR: Estimated Creatinine Clearance: 99.9 mL/min (by C-G formula based on SCr of 0.64 mg/dL). Recent Labs  Lab 09/10/21 1104 09/10/21 1324 09/11/21 0159 09/11/21 1644 09/12/21 0239  PROCALCITON 0.82  --   --   --   --  WBC 30.8*  --  28.2* 24.3* 22.9*  LATICACIDVEN 2.1* 1.4  --   --   --     Liver Function Tests: Recent Labs  Lab 09/10/21 1104 09/11/21 1644 09/12/21 0239  AST 25 19 19   ALT 20 16 15   ALKPHOS 84 77 68  BILITOT 1.1 0.5 0.8  PROT 6.0* 4.7* 4.4*  ALBUMIN 2.5* 1.6* 1.6*   No results for input(s): LIPASE, AMYLASE in the last 168 hours. Recent Labs  Lab 09/10/21 1104  AMMONIA 13    ABG    Component Value Date/Time   PHART 7.243 (L) 09/11/2021 1636   PCO2ART 56.3 (H) 09/11/2021 1636   PO2ART 83 09/11/2021 1636   HCO3 24.5 09/11/2021 1636   TCO2 26 09/11/2021 1636   ACIDBASEDEF 4.0 (H) 09/11/2021 1636   O2SAT 94 09/11/2021 1636     Coagulation Profile: Recent Labs  Lab 09/10/21 2346  INR 1.4*    Cardiac Enzymes: Recent Labs  Lab 09/10/21 1104  CKTOTAL 135    HbA1C: No results found for: HGBA1C  CBG: Recent Labs  Lab 09/11/21 0307 09/11/21 1645 09/11/21 1955 09/12/21 0005 09/12/21 0330  GLUCAP 87 101* 125* 128* 127*

## 2021-09-12 NOTE — TOC Initial Note (Signed)
Transition of Care [facility]) - Initial/Assessment Note    Patient Details  Name: Lance House MRN: [id number] Date of Birth: 12/16/1950  Transition of Care [facility]) CM/SW Contact:    Tom-Johnson, Renea Ee, RN Phone Number: 09/12/2021, 9:06 AM  Clinical Narrative:                  CM spoke with patient and wife, Lance House at bedside about needs for post hospital transition. Patient gave CM permission to speak with Lance House. Lance House states she and patient lives in a Mobile home and she has a son that is disabled. 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 Recently admitted for a fall and went to rehab but left AMA. Now admitted for Emphysema. Lance House states patient is a alcoholic and she will not be able to care for patient at discharge if he is sent home because she has to work . Requesting patient go to rehab. CM told Lance House that PT/OT has to evaluate patient and make their recommendations. Patient has a walker, cane,crutches,shower seat and bedside commode at home. PCP is with  Financial trader and uses [facility] on [address] in [place]. No recommendations noted at this time. CM will continue to follow with needs.    Barriers to Discharge: Continued Medical Work up   Patient Goals and CMS Choice Patient states their goals for this hospitalization and ongoing recovery are:: To get better and go home. CMS Medicare.gov Compare Post Acute Care list provided to:: Patient Choice offered to / list presented to : Spouse  Expected Discharge Plan and Services     Discharge Planning Services: CM Consult   Living arrangements for the past 2 months: Mobile Home                                      Prior Living Arrangements/Services Living arrangements for the past 2 months: Mobile Home Lives with:: Spouse Patient language and need for interpreter reviewed:: Yes Do you feel safe going back to the place where you live?: Yes       Need for Family Participation in Patient Care: Yes (Comment) Care giver support system in place?: Yes (comment) Current home services: DME (Walker, crutches, shower seat, bedside commmode) Criminal Activity/Legal Involvement Pertinent to Current Situation/Hospitalization: No - Comment as needed  Activities of Daily Living      Permission Sought/Granted Permission sought to share information with : Case Manager, Customer service manager, Family Supports Permission granted to share information with : Yes, Verbal Permission Granted              Emotional Assessment Appearance:: Appears stated age Attitude/Demeanor/Rapport: Engaged, Gracious Affect (typically observed): Accepting, Appropriate, Calm, Hopeful Orientation: : Oriented to Self, Oriented to Place, Oriented to Situation Alcohol / Substance Use: Alcohol Use Psych Involvement: No (comment)  Admission diagnosis:  Sepsis ([place]) [A41.9] Pleural effusion [J90] Empyema (HCC) [J86.9] Patient Active Problem List   Diagnosis Date Noted   Pressure injury of skin 09/11/2021   Empyema ([place]) 09/10/2021   Compression fracture of fourth lumbar vertebra (H[facility])    Elevated troponin    Pain    Alcoholic intoxication without complication ([place])    Fall    Compression fracture of thoracic vertebra ([place]) 08/18/2021   Compression fracture of L4 lumbar vertebra, closed, initial encounter ([place]) 08/18/2021   Hyponatremia 08/16/2021   Hypokalemia 08/16/2021   Rhabdomyolysis 08/16/2021  ICH (intracerebral hemorrhage) ([place]) 08/15/2021   S/P inguinal hernia repair 04/13/2019   Incarcerated left inguinal hernia    Abdominal pain 04/08/2019   Hernia 08/13/2013   Tobacco use disorder 08/13/2013   Chronic sinusitis 02/26/2013   Dyspnea 02/26/2013   Anxiety 02/03/2013   Left flank pain 02/03/2013   PCP:  [facility], [place]:   [facility] [address], [place] - [place]  [address] Phone: [phone] Fax: [place] #[id number] [address], [place] [address] [place] [address] Phone: [phone] Fax: [phone]     Social Determinants of Health (SDOH) Interventions    Readmission Risk Interventions No flowsheet data found.

## 2021-09-12 NOTE — Progress Notes (Addendum)
TCTS DAILY ICU PROGRESS NOTE                   [address]            [place] [address]          [phone]   1 Day Post-Op Procedure(s) (LRB): VIDEO ASSISTED THORACOSCOPY (VATS)/DECORTICATION (Left)  Total Length of Stay:  LOS: 2 days   Subjective: Patient intubated, just given Fentanyl. In restraints for safety  Objective: Vital signs in last 24 hours: Temp:  [96.3 F (35.7 C)-99.5 F (37.5 C)] 97.8 F (36.6 C) (02/21 0707) Pulse Rate:  [64-120] 79 (02/21 0341) Cardiac Rhythm: Normal sinus rhythm (02/21 0400) Resp:  [0-31] 24 (02/21 0445) BP: (95-130)/(52-88) 95/65 (02/21 0445) SpO2:  [90 %-100 %] 91 % (02/21 0445) Arterial Line BP: (86-128)/(45-64) 94/50 (02/21 0445) FiO2 (%):  [50 %-100 %] 50 % (02/21 0400) Weight:  [80.1 kg] 80.1 kg (02/21 0500)  Filed Weights   09/11/21 0500 09/12/21 0500  Weight: 78.7 kg 80.1 kg    Weight change: 1.4 kg      Intake/Output from previous day: 02/20 0701 - 02/21 0700 In: 4959.1 [I.V.:3049; NG/GT:120; IV Piggyback:1790.1] Out: 1055 [Urine:625; Blood:200; Chest Tube:230]  Intake/Output this shift: No intake/output data recorded.  Current Meds: Scheduled Meds:  acetaminophen  1,000 mg Oral Q6H   Or   acetaminophen (TYLENOL) oral liquid 160 mg/5 mL  1,000 mg Per Tube Q6H   bisacodyl  10 mg Oral Daily   chlorhexidine gluconate (MEDLINE KIT)  15 mL Mouth Rinse BID   Chlorhexidine Gluconate Cloth  6 each Topical Daily   docusate  100 mg Per Tube BID   fentaNYL (SUBLIMAZE) injection  25 mcg Intravenous Once   folic acid  1 mg Per Tube Daily   heparin  5,000 Units Subcutaneous Q8H   ketorolac  15 mg Intravenous Q6H   LORazepam  0-4 mg Intravenous Q6H   Followed by   Derrill Memo ON 09/13/2021] LORazepam  0-4 mg Intravenous Q12H   mouth rinse  15 mL Mouth Rinse 10 times per day   multivitamin with minerals  1 tablet Per Tube Daily   pantoprazole (PROTONIX) IV  40 mg Intravenous Q24H   senna-docusate  1 tablet  Per Tube QHS   thiamine  100 mg Per Tube Daily   Or   thiamine  100 mg Intravenous Daily   Continuous Infusions:  sodium chloride     0.9 % NaCl with KCl 20 mEq / L 100 mL/hr at 09/12/21 0615   ceFEPime (MAXIPIME) IV Stopped (09/12/21 0430)   fentaNYL infusion INTRAVENOUS 100 mcg/hr (09/12/21 0615)   metronidazole Stopped (09/12/21 0017)   norepinephrine (LEVOPHED) Adult infusion 4 mcg/min (09/12/21 0502)   propofol (DIPRIVAN) infusion 30 mcg/kg/min (09/12/21 0615)   vancomycin Stopped (09/11/21 2229)   PRN Meds:.[START ON 09/17/2021] acetaminophen, albuterol, fentaNYL, fentaNYL (SUBLIMAZE) injection, ipratropium-albuterol, LORazepam **OR** LORazepam, ondansetron (ZOFRAN) IV  General appearance: no distress. Patient able to follow some commands this am Heart: RRR Lungs: Coarse on left Abdomen: Soft, bowel sounds Extremities: SCDs Wound: Aquacel intact  Lab Results: CBC: Recent Labs    09/11/21 1644 09/12/21 0239  WBC 24.3* 22.9*  HGB 13.7 13.1  HCT 42.5 39.2  PLT 348 353   BMET:  Recent Labs    09/11/21 1644 09/12/21 0239  NA 136 133*  K 4.7 4.8  CL 104 105  CO2 24 20*  GLUCOSE 114* 127*  BUN 12 15  CREATININE 0.67 0.64  CALCIUM 8.1* 7.7*    CMET: Lab Results  Component Value Date   WBC 22.9 (H) 09/12/2021   HGB 13.1 09/12/2021   HCT 39.2 09/12/2021   PLT 353 09/12/2021   GLUCOSE 127 (H) 09/12/2021   TRIG 51 09/12/2021   ALT 15 09/12/2021   AST 19 09/12/2021   NA 133 (L) 09/12/2021   K 4.8 09/12/2021   CL 105 09/12/2021   CREATININE 0.64 09/12/2021   BUN 15 09/12/2021   CO2 20 (L) 09/12/2021   TSH 1.319 08/15/2021   INR 1.4 (H) 09/10/2021      PT/INR:  Recent Labs    09/10/21 2346  LABPROT 17.4*  INR 1.4*   Radiology: DG CHEST PORT 1 VIEW  Result Date: 09/11/2021 CLINICAL DATA:  Left pleural effusion EXAM: PORTABLE CHEST 1 VIEW COMPARISON:  Previous studies including the examination of 09/10/2021 FINDINGS: Cardiac size is within normal  limits. Tip of endotracheal tube is approximately 7.4 cm above the carina. Enteric tube is noted traversing the esophagus with its distal portion in the stomach. There is interval placement of left chest tube with its tip in the medial left lower lung fields with almost complete clearing of left pleural effusion. There is another left chest tube with its tip in the medial left upper lung fields. There is small left apical pneumothorax. There is possible minimal pneumothorax in the lateral aspect of left lower lung fields. New patchy infiltrates are seen in the right parahilar region and right lower lung fields. There is blunting of right lateral CP angle. IMPRESSION: There is almost complete resolution of large left pleural effusion after placement of 2 left chest tubes. Small left pneumothorax. New patchy infiltrates are seen in the right parahilar region and right lower lung fields suggesting pneumonia or asymmetric pulmonary edema. Blunting of right lateral CP angle suggests small right pleural effusion. Other findings as described in the body of the report. Electronically Signed   By: Elmer Picker M.D.   On: 09/11/2021 16:40     Assessment/Plan: S/P Procedure(s) (LRB): VIDEO ASSISTED THORACOSCOPY (VATS)/DECORTICATION (Left) CV-on Nor epinephrine drip. Pulmonary-remained intubated post op and this am. Pulmonary/CCM will decide when to extubate;hopefully, this am. Chest tubes with 390 cc since surgery. Chest tubes are to water seal, no air leak seen while on vent. CXR this am appears stable (trace left apical pneumothorax) . Chest tubes to remain for now. Gram stain shows rare gram positive cocci;culture result pending.  ID-WBC slightly decreased to 22,900. On Cefepime, Flagyl, and Vancomycin. 4. History of alcohol abuse-CIWA protocol 5. Per nursing staff, patient removed foley. He has condom cath now. He had very little urine output;was bladder scanned last evening with plan to I/O PRN 6. On  Heparin 5000 units tid for DVT prophylaxis 7. IVF at 100 ml/hr;will defer to primary when to decrease/stop  Donielle Liston Alba PA-C 09/12/2021 7:11 AM   Patient seen and examined, agree with above Ct with about 400 ml of serosanguinous drainage, no air leak GPC on gram stain CXR shows reexpansion of left lung Leave tubes in place Vent per CCM- ok to extubate from our standpoint  [place]. Roxan Hockey, MD [facility] [phone]

## 2021-09-13 ENCOUNTER — Inpatient Hospital Stay (HOSPITAL_COMMUNITY): Payer: Medicare HMO

## 2021-09-13 DIAGNOSIS — J869 Pyothorax without fistula: Secondary | ICD-10-CM | POA: Diagnosis not present

## 2021-09-13 DIAGNOSIS — J9601 Acute respiratory failure with hypoxia: Secondary | ICD-10-CM | POA: Diagnosis not present

## 2021-09-13 LAB — CBC WITH DIFFERENTIAL/PLATELET
Abs Immature Granulocytes: 0.19 10*3/uL — ABNORMAL HIGH (ref 0.00–0.07)
Basophils Absolute: 0 10*3/uL (ref 0.0–0.1)
Basophils Relative: 0 %
Eosinophils Absolute: 0 10*3/uL (ref 0.0–0.5)
Eosinophils Relative: 0 %
HCT: 38.4 % — ABNORMAL LOW (ref 39.0–52.0)
Hemoglobin: 12.9 g/dL — ABNORMAL LOW (ref 13.0–17.0)
Immature Granulocytes: 2 %
Lymphocytes Relative: 8 %
Lymphs Abs: 0.9 10*3/uL (ref 0.7–4.0)
MCH: 31.9 pg (ref 26.0–34.0)
MCHC: 33.6 g/dL (ref 30.0–36.0)
MCV: 95 fL (ref 80.0–100.0)
Monocytes Absolute: 0.8 10*3/uL (ref 0.1–1.0)
Monocytes Relative: 8 %
Neutro Abs: 9.1 10*3/uL — ABNORMAL HIGH (ref 1.7–7.7)
Neutrophils Relative %: 82 %
Platelets: 317 10*3/uL (ref 150–400)
RBC: 4.04 MIL/uL — ABNORMAL LOW (ref 4.22–5.81)
RDW: 13.8 % (ref 11.5–15.5)
WBC: 11.1 10*3/uL — ABNORMAL HIGH (ref 4.0–10.5)
nRBC: 0 % (ref 0.0–0.2)

## 2021-09-13 LAB — VANCOMYCIN, TROUGH: Vancomycin Tr: 20 ug/mL (ref 15–20)

## 2021-09-13 LAB — COMPREHENSIVE METABOLIC PANEL
ALT: 17 U/L (ref 0–44)
AST: 18 U/L (ref 15–41)
Albumin: 1.8 g/dL — ABNORMAL LOW (ref 3.5–5.0)
Alkaline Phosphatase: 67 U/L (ref 38–126)
Anion gap: 5 (ref 5–15)
BUN: 15 mg/dL (ref 8–23)
CO2: 25 mmol/L (ref 22–32)
Calcium: 8 mg/dL — ABNORMAL LOW (ref 8.9–10.3)
Chloride: 102 mmol/L (ref 98–111)
Creatinine, Ser: 0.57 mg/dL — ABNORMAL LOW (ref 0.61–1.24)
GFR, Estimated: >60 mL/min (ref >60)
Glucose, Bld: 85 mg/dL (ref 70–99)
Potassium: 4.4 mmol/L (ref 3.5–5.1)
Sodium: 132 mmol/L — ABNORMAL LOW (ref 135–145)
Total Bilirubin: 0.2 mg/dL — ABNORMAL LOW (ref 0.3–1.2)
Total Protein: 4.8 g/dL — ABNORMAL LOW (ref 6.5–8.1)

## 2021-09-13 LAB — SURGICAL PATHOLOGY

## 2021-09-13 LAB — MAGNESIUM: Magnesium: 1.9 mg/dL (ref 1.7–2.4)

## 2021-09-13 LAB — GLUCOSE, CAPILLARY: Glucose-Capillary: 111 mg/dL — ABNORMAL HIGH (ref 70–99)

## 2021-09-13 LAB — PHOSPHORUS: Phosphorus: 2.3 mg/dL — ABNORMAL LOW (ref 2.5–4.6)

## 2021-09-13 IMAGING — DX DG CHEST 1V PORT
1 series · 1 of 1 positions shown · non-contrast
Comparison: Chest radiograph dated [DATE]

CLINICAL DATA: Encounter for pneumothorax

EXAM:
PORTABLE CHEST 1 VIEW

[chest ap]
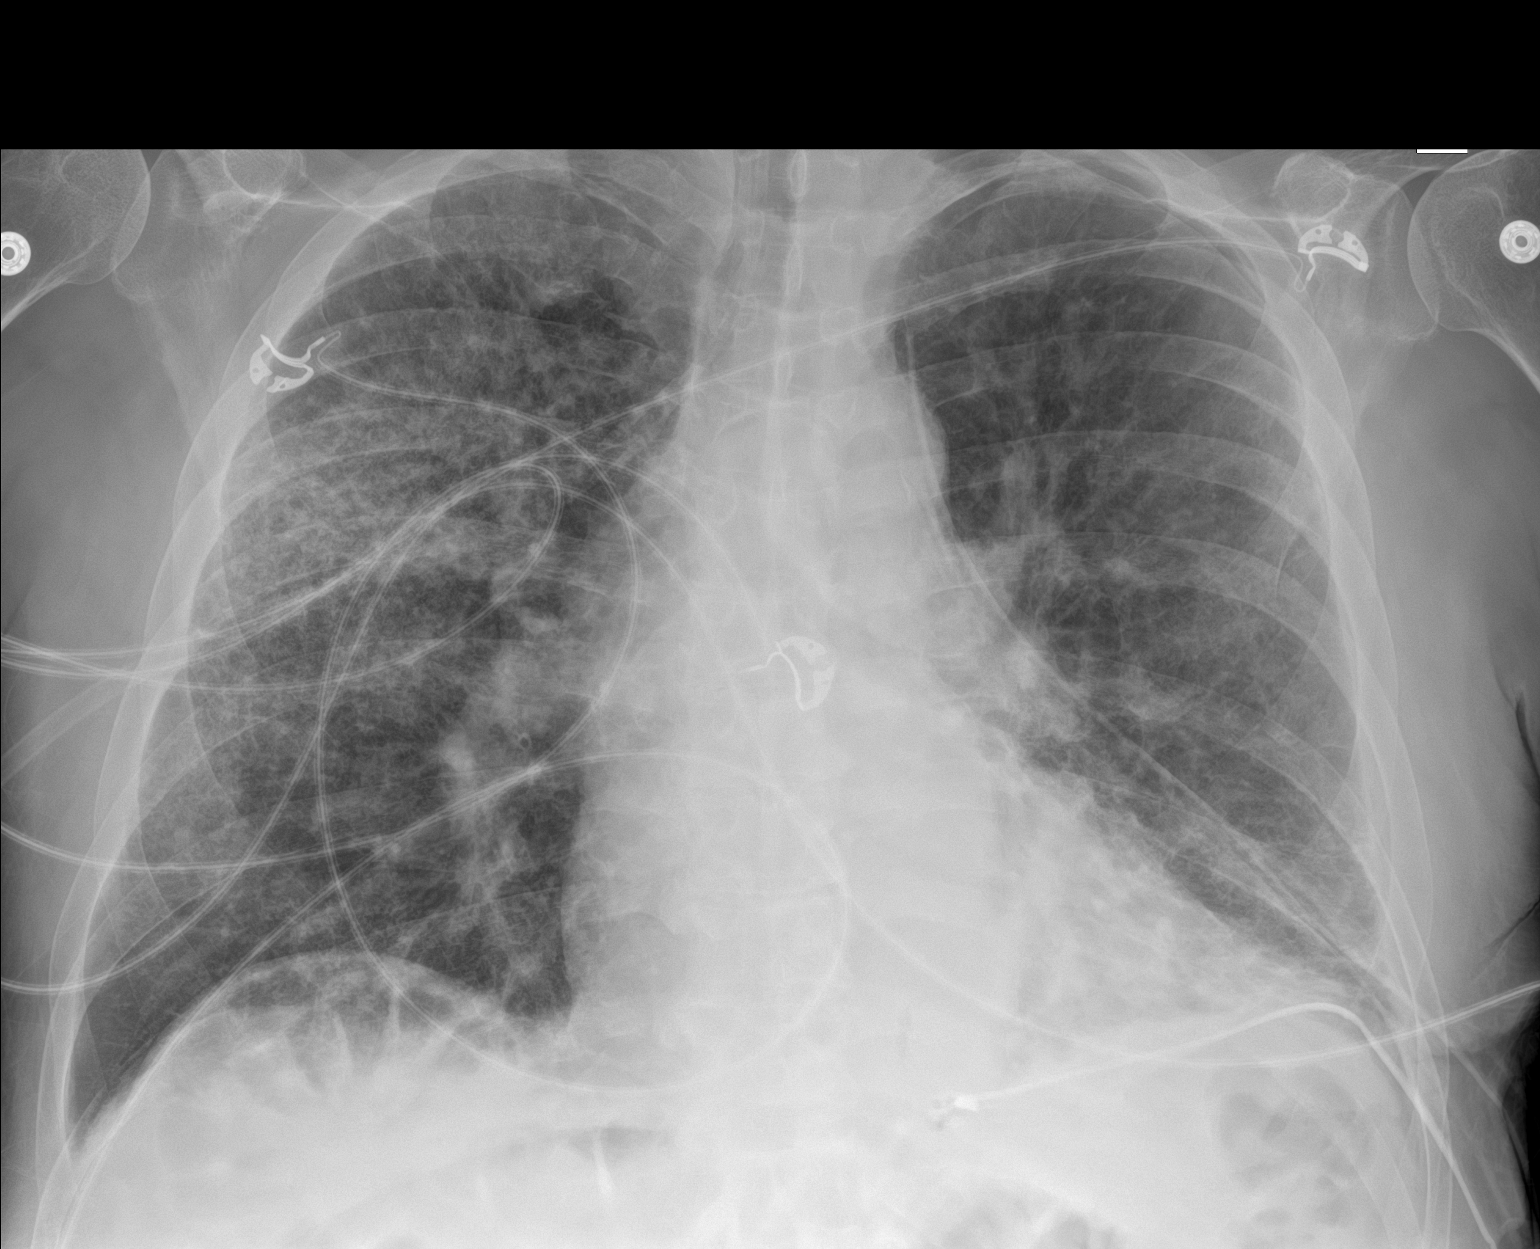

[1 of 1 positions shown; findings below may reference images not displayed]

FINDINGS: Left sided chest tube and small left pneumothorax is unchanged.
Interval removal of the endotracheal and feeding tubes.

Asymmetric hazy opacities in the right upper lobe are unchanged.
Left basilar atelectasis and/or small effusion are also unchanged.
No other significant interval change.
IMPRESSION: 1. Interval removal of the endotracheal and feeding tubes.
2. Left sided chest tube and small left apical pneumothorax
unchanged.
3. Right lung opacities as well as left basilar atelectasis are also
unchanged.

## 2021-09-13 MED ORDER — CLONAZEPAM 0.5 MG PO TABS
0.5000 mg | ORAL_TABLET | Freq: Two times a day (BID) | ORAL | Status: DC | PRN
Start: 2021-09-13 — End: 2021-09-27
  Administered 2021-09-13 – 2021-09-22 (×14): 0.5 mg via ORAL
  Administered 2021-09-23: 0.25 mg via ORAL
  Administered 2021-09-23 – 2021-09-26 (×7): 0.5 mg via ORAL
  Filled 2021-09-13 (×22): qty 1

## 2021-09-13 MED ORDER — CLONAZEPAM 0.5 MG PO TABS
0.2500 mg | ORAL_TABLET | Freq: Once | ORAL | Status: DC
Start: 2021-09-13 — End: 2021-09-13

## 2021-09-13 MED ORDER — VANCOMYCIN HCL IN DEXTROSE 1-5 GM/200ML-% IV SOLN
1000.0000 mg | Freq: Two times a day (BID) | INTRAVENOUS | Status: DC
  Administered 2021-09-13 – 2021-09-14 (×3): 1000 mg via INTRAVENOUS
  Filled 2021-09-13 (×3): qty 200

## 2021-09-13 MED ORDER — ORAL CARE MOUTH RINSE
15.0000 mL | Freq: Two times a day (BID) | OROMUCOSAL | Status: DC
  Administered 2021-09-13 – 2021-09-25 (×16): 15 mL via OROMUCOSAL

## 2021-09-13 MED ORDER — PANTOPRAZOLE SODIUM 20 MG PO TBEC
20.0000 mg | DELAYED_RELEASE_TABLET | Freq: Every day | ORAL | Status: DC
  Filled 2021-09-13: qty 1

## 2021-09-13 MED ORDER — GABAPENTIN 600 MG PO TABS
300.0000 mg | ORAL_TABLET | Freq: Two times a day (BID) | ORAL | Status: DC
  Administered 2021-09-13 – 2021-09-17 (×9): 300 mg via ORAL
  Filled 2021-09-13 (×9): qty 1

## 2021-09-13 MED ORDER — SODIUM PHOSPHATES 45 MMOLE/15ML IV SOLN
30.0000 mmol | Freq: Once | INTRAVENOUS | Status: AC
  Administered 2021-09-13: 30 mmol via INTRAVENOUS
  Filled 2021-09-13: qty 10

## 2021-09-13 MED ORDER — OXYCODONE HCL 5 MG PO TABS
7.5000 mg | ORAL_TABLET | ORAL | Status: DC | PRN
  Administered 2021-09-13 – 2021-09-14 (×7): 7.5 mg via ORAL
  Filled 2021-09-13 (×7): qty 2

## 2021-09-13 NOTE — Progress Notes (Addendum)
TCTS DAILY ICU PROGRESS NOTE                   [address]            [place] [address]          [phone]   2 Days Post-Op Procedure(s) (LRB): VIDEO ASSISTED THORACOSCOPY (VATS)/DECORTICATION (Left)  Total Length of Stay:  LOS: 3 days   Subjective: Patient with a lot of pain "everywhere" this am  Objective: Vital signs in last 24 hours: Temp:  [97.5 F (36.4 C)-97.9 F (36.6 C)] 97.6 F (36.4 C) (02/22 0300) Pulse Rate:  [70-99] 89 (02/22 0156) Cardiac Rhythm: Normal sinus rhythm (02/22 0000) Resp:  [9-28] 19 (02/21 2015) BP: (109-171)/(60-103) 140/93 (02/22 0156) SpO2:  [89 %-100 %] 96 % (02/21 2015) Arterial Line BP: (85-161)/(52-75) 148/59 (02/21 1030) FiO2 (%):  [36 %-50 %] 36 % (02/21 1028) Weight:  [80.7 kg] 80.7 kg (02/22 0500)  Filed Weights   09/11/21 0500 09/12/21 0500 09/13/21 0500  Weight: 78.7 kg 80.1 kg 80.7 kg    Weight change: 0.6 kg      Intake/Output from previous day: 02/21 0701 - 02/22 0700 In: 1053.9 [P.O.:350; I.V.:239.1; IV Piggyback:464.9] Out: 1000 [Urine:400; Chest Tube:600]  Intake/Output this shift: Total I/O In: 350 [P.O.:350] Out: 400 [Urine:400]  Current Meds: Scheduled Meds:  acetaminophen  1,000 mg Oral Q6H   bethanechol  5 mg Oral TID   bisacodyl  10 mg Oral Daily   chlorhexidine gluconate (MEDLINE KIT)  15 mL Mouth Rinse BID   Chlorhexidine Gluconate Cloth  6 each Topical Daily   docusate sodium  100 mg Oral BID   folic acid  1 mg Oral Daily   heparin  5,000 Units Subcutaneous Q8H   ketorolac  15 mg Intravenous Q6H   lidocaine  1 patch Transdermal Q24H   mouth rinse  15 mL Mouth Rinse BID   multivitamin with minerals  1 tablet Oral Daily   pantoprazole (PROTONIX) IV  40 mg Intravenous Q24H   senna-docusate  1 tablet Oral QHS   thiamine  100 mg Oral Daily   Continuous Infusions:  sodium chloride     ceFEPime (MAXIPIME) IV 2 g (09/13/21 0421)   metronidazole 500 mg (09/12/21 2145)    vancomycin 1,250 mg (09/12/21 2111)   PRN Meds:.albuterol, ipratropium-albuterol, LORazepam **OR** LORazepam, ondansetron (ZOFRAN) IV, oxyCODONE  General appearance: no distress. Patient awake and alert  Heart: RRR Lungs: Coarse breath sounds Abdomen: Soft, non tender, bowel sounds Extremities: SCDs Wound: Aquacel intact  Lab Results: CBC: Recent Labs    09/11/21 1644 09/12/21 0239  WBC 24.3* 22.9*  HGB 13.7 13.1  HCT 42.5 39.2  PLT 348 353    BMET:  Recent Labs    09/11/21 1644 09/12/21 0239  NA 136 133*  K 4.7 4.8  CL 104 105  CO2 24 20*  GLUCOSE 114* 127*  BUN 12 15  CREATININE 0.67 0.64  CALCIUM 8.1* 7.7*     CMET: Lab Results  Component Value Date   WBC 22.9 (H) 09/12/2021   HGB 13.1 09/12/2021   HCT 39.2 09/12/2021   PLT 353 09/12/2021   GLUCOSE 127 (H) 09/12/2021   TRIG 51 09/12/2021   ALT 15 09/12/2021   AST 19 09/12/2021   NA 133 (L) 09/12/2021   K 4.8 09/12/2021   CL 105 09/12/2021   CREATININE 0.64 09/12/2021   BUN 15 09/12/2021   CO2 20 (L) 09/12/2021  TSH 1.319 08/15/2021   INR 1.4 (H) 09/10/2021      PT/INR:  Recent Labs    09/10/21 2346  LABPROT 17.4*  INR 1.4*    Radiology: No results found.   Assessment/Plan: S/P Procedure(s) (LRB): VIDEO ASSISTED THORACOSCOPY (VATS)/DECORTICATION (Left) CV-SR.  Pulmonary-On 2 liters of oxygen via [place]. Chest tubes with 600 cc last 24 hours. Chest tubes are to water seal, tidling with cough but no air leak . CXR this am appears stable (trace left apical pneumothorax, RUL airspace opacities) . Chest tubes to remain for now. Gram stain shows rare gram positive cocci;culture result no growth for < 24 hours ID-WBC this am decreased to 11,100. On Cefepime, Flagyl, and Vancomycin. Await this am's result 4. History of alcohol abuse-CIWA protocol 5. On Heparin 5000 units tid for DVT prophylaxis 6. Regarding pain control, has Lidocaine patch, Oxy IR PRN and scheduled Toradol. Will try  Gabapentin. 7. Deconditioned-continue PT/OT  Nani Skillern PA-C 09/13/2021 6:56 AM   Patient seen and examined, agree with above Keep Ct in place today. CXR looks remarkably good. Pain control an issue- hip and back in addition to surgical site. Was already on oxycodone at home- may need a higher dose  Remo Lipps C. Roxan Hockey, MD [facility] [phone]

## 2021-09-13 NOTE — Evaluation (Signed)
Occupational Therapy Evaluation Patient Details Name: Lance House MRN: [id number] DOB: 03-Jan-1951 Today's Date: 09/13/2021   History of Present Illness Pt is a 71 y/o male presenting 2/19 with SOB, weakness, L sided chest pain for several weeks. Found hypoxic, septic shock with L sided empyema.  Recent admission for traumatic ICH after fall and dc'd to SNF but left AMA. S/P VATS 2/20. PMH includes: ETOH, depression, compression fractures.   Clinical Impression   Patient admitted for above and limited by problem list below, including generalized weakness, decreased activity tolerance, impaired balance, pain, and impaired cognition.  Patient reports PTA using RW for mobility, completing ADLs without assist; spouse assisting with IADLs but question history as pts timeline difficult to follow at times.  Pt currently requires min assist for bed mobility, max assist +2 for transfers and up to total assist +2 for LB ADLs.  He follow simple commands with increased time but presents with poor attention, recall, problem solving and awareness.  Believe he will benefit from continued OT services acutely and after dc at SNF level to optimize independence, safety with Adls and mobility.      Recommendations for follow up therapy are one component of a multi-disciplinary discharge planning process, led by the attending physician.  Recommendations may be updated based on patient status, additional functional criteria and insurance authorization.   Follow Up Recommendations  Skilled nursing-short term rehab (<3 hours/day)    Assistance Recommended at Discharge Frequent or constant Supervision/Assistance  Patient can return home with the following Two people to help with walking and/or transfers;Two people to help with bathing/dressing/bathroom    Functional Status Assessment  Patient has had a recent decline in their functional status and demonstrates the ability to make significant improvements in function  in a reasonable and predictable amount of time.  Equipment Recommendations  Other (comment) (TBD)    Recommendations for Other Services       Precautions / Restrictions Precautions Precautions: Fall Precaution Comments: L chest tube Restrictions Weight Bearing Restrictions: No      Mobility Bed Mobility Overal bed mobility: Needs Assistance Bed Mobility: Supine to Sit     Supine to sit: Min assist     General bed mobility comments: min A for managing lines and tubes as pt with decreased awareness    Transfers Overall transfer level: Needs assistance   Transfers: Sit to/from Stand Sit to Stand: +2 physical assistance, Max assist   Squat pivot transfers: Max assist, +2 physical assistance       General transfer comment: pt with increased LE strength limiting transfers, maxAx2 for power up to standing, once knees locked requires modAx2 for balance, sat back on EoB and requires maxAx2 for squat pivot to recliner, once final sit to stand for placement of clean pad      Balance Overall balance assessment: Needs assistance, History of Falls Sitting-balance support: Bilateral upper extremity supported, Single extremity supported, Feet supported Sitting balance-Leahy Scale: Fair     Standing balance support: Bilateral upper extremity supported Standing balance-Leahy Scale: Zero Standing balance comment: external rotation of bilateral hips in standing                           ADL either performed or assessed with clinical judgement   ADL Overall ADL's : Needs assistance/impaired     Grooming: Set up;Sitting           Upper Body Dressing : Minimal assistance;Sitting   Lower Body  Dressing: Total assistance;+2 for physical assistance;+2 for safety/equipment;Sit to/from stand   Toilet Transfer: Maximal assistance;+2 for physical assistance;+2 for safety/equipment;Squat-pivot Toilet Transfer Details (indicate cue type and reason): to  recliner Toileting- Clothing Manipulation and Hygiene: Total assistance;+2 for physical assistance;+2 for safety/equipment;Sit to/from stand       Functional mobility during ADLs: Maximal assistance;+2 for safety/equipment;+2 for physical assistance       Vision   Vision Assessment?: No apparent visual deficits     Perception     Praxis      Pertinent Vitals/Pain Pain Assessment Pain Assessment: 0-10 Pain Score: 10-Worst pain ever Pain Location: back, L hip, L ribs Pain Descriptors / Indicators: Sharp, Shooting Pain Intervention(s): Limited activity within patient's tolerance, Monitored during session, Repositioned, Patient requesting pain meds-RN notified     Hand Dominance Right   Extremity/Trunk Assessment Upper Extremity Assessment Upper Extremity Assessment: Generalized weakness   Lower Extremity Assessment Lower Extremity Assessment: Defer to PT evaluation   Cervical / Trunk Assessment Cervical / Trunk Assessment: Other exceptions (compression fx)   Communication Communication Communication: No difficulties   Cognition Arousal/Alertness: Awake/alert Behavior During Therapy: WFL for tasks assessed/performed Overall Cognitive Status: Impaired/Different from baseline Area of Impairment: Attention, Memory, Following commands, Problem solving, Awareness, Safety/judgement                   Current Attention Level: Sustained Memory: Decreased recall of precautions, Decreased short-term memory Following Commands: Follows one step commands consistently, Follows one step commands with increased time Safety/Judgement: Decreased awareness of safety, Decreased awareness of deficits Awareness: Emergent Problem Solving: Slow processing, Requires verbal cues, Decreased initiation, Difficulty sequencing General Comments: pt with preoccupation about lines and leads and continually asks time as pain medication is due soon, very difficult to follow progression of  events     General Comments  VSS on 2L [place]    Exercises     Shoulder Instructions      Home Living Family/patient expects to be discharged to:: Private residence Living Arrangements: Spouse/significant other Available Help at Discharge: Family;Available PRN/intermittently (wife works 12 hour shifts) Type of Home: Mobile home Home Access: Stairs to enter [employer] of Steps: 6 Entrance Stairs-Rails: Right;Left;Can reach both Home Layout: One level     Bathroom Shower/Tub: Tub/shower unit;Walk-in shower   Bathroom Toilet: Standard Bathroom Accessibility: Yes   Home Equipment: Rollator (4 wheels);BSC/3in1;Crutches          Prior Functioning/Environment Prior Level of Function : Needs assist;History of Falls (last six months);Driving       Physical Assist : Mobility (physical);ADLs (physical)   ADLs (physical): IADLs Mobility Comments: uses RW for household ambulation, fell due to weakness in but unable to get up off the floor ADLs Comments: states his wife does cooking and cleaning and brings him his medicine, reports independent ADLS        OT Problem List: Decreased strength;Decreased activity tolerance;Impaired balance (sitting and/or standing);Decreased cognition;Decreased safety awareness;Decreased knowledge of use of DME or AE;Decreased knowledge of precautions;Pain      OT Treatment/Interventions: Self-care/ADL training;Therapeutic exercise;DME and/or AE instruction;Therapeutic activities;Patient/family education;Balance training    OT Goals(Current goals can be found in the care plan section) Acute Rehab OT Goals Patient Stated Goal: get better OT Goal Formulation: With patient Time For Goal Achievement: 09/27/21 Potential to Achieve Goals: Fair  OT Frequency: Min 2X/week    Co-evaluation              AM-PAC OT "6 Clicks" Daily Activity  Outcome Measure Help from another person eating meals?: None Help from another person taking  care of personal grooming?: A Little Help from another person toileting, which includes using toliet, bedpan, or urinal?: Total Help from another person bathing (including washing, rinsing, drying)?: A Lot Help from another person to put on and taking off regular upper body clothing?: A Little Help from another person to put on and taking off regular lower body clothing?: Total 6 Click Score: 14   End of Session Equipment Utilized During Treatment: Oxygen Nurse Communication: Mobility status  Activity Tolerance: Patient tolerated treatment well Patient left: in chair;with call bell/phone within reach;with chair alarm set  OT Visit Diagnosis: Other abnormalities of gait and mobility (R26.89);Muscle weakness (generalized) (M62.81);Pain;Other symptoms and signs involving cognitive function Pain - Right/Left:  (back) Pain - part of body:  (back)                Time: 1132-1208 OT Time Calculation (min): 36 min Charges:  OT General Charges $OT Visit: 1 Visit OT Evaluation $OT Eval Moderate Complexity: 1 Mod  Jolaine Artist, OT Acute Rehabilitation Services Pager [phone] Office [phone]   Delight Stare 09/13/2021, 1:59 PM

## 2021-09-13 NOTE — Progress Notes (Addendum)
Pharmacy Antibiotic Note  Lance House is a 71 y.o. male admitted on 09/10/2021 with sepsis and empyema. Pharmacy has been consulted for cefepime and vancomycin dosing. Also on Flagyl per MD. Antibiotic day #4.  SCr remains stable at 0.57.  Vancomycin peak 37, trough 20 on current dose 1250mg  IV q12h, for calculated AUC supratherapeutic at 651.  Plan: Reduce vancomycin dose to 1g IV q12h for new estimated AUC of 520 (to start at 1000 this morning) Cefepime 2g IV q8h Flagyl 500mg  IV q12h Monitor clinical progress, c/s, renal function F/u de-escalation plan/LOT, vancomycin levels at new steady state and PRN    Weight: 80.7 kg (177 lb 14.6 oz)  Temp (24hrs), Avg:97.8 F (36.6 C), Min:97.5 F (36.4 C), Max:98.2 F (36.8 C)  Recent Labs  Lab 09/10/21 1104 09/10/21 1324 09/10/21 1944 09/10/21 2346 09/11/21 0159 09/11/21 0452 09/11/21 1644 09/12/21 0239 09/12/21 2332 09/13/21 0545 09/13/21 0719  WBC 30.8*  --   --   --  28.2*  --  24.3* 22.9*  --  11.1*  --   CREATININE 0.72  --    < > 0.59*  --  0.58* 0.67 0.64  --  0.57*  --   LATICACIDVEN 2.1* 1.4  --   --   --   --   --   --   --   --   --   VANCOTROUGH  --   --   --   --   --   --   --   --   --   --  20  VANCOPEAK  --   --   --   --   --   --   --   --  37  --   --    < > = values in this interval not displayed.     Estimated Creatinine Clearance: 99.9 mL/min (A) (by C-G formula based on SCr of 0.57 mg/dL (L)).    Allergies  Allergen Reactions   Ciprocin-Fluocin-Procin [Fluocinolone] Anaphylaxis    Hip hurt   Ciprofloxacin    Colchicine    Duloxetine Diarrhea, Nausea And Vomiting and Other (See Comments)    Altered mental status    Antimicrobials this admission: 2/19 cefepime >> 2/19 vancomycin >> 2/19 flagyl >>  Microbiology results: 2/19 blood x2 - ngtd 2/20 L pleural fluid spec A - ngtd 2/20 L pleural peel sample B - ngtd 2/20 L pleural peel Gram stain - few GPC 2/20 AFB - neg x 1 2/20 Fungal cx -  pending   Arturo Morton, PharmD, BCPS Please check AMION for all [place] contact numbers Clinical Pharmacist 09/13/2021 8:42 AM

## 2021-09-13 NOTE — Evaluation (Signed)
Physical Therapy Evaluation Patient Details Name: Lance House MRN: [id number] DOB: [birth year] Today's Date: 09/13/2021  History of Present Illness  Pt is a 71 y/o male presenting 2/19 with SOB, weakness, L sided chest pain for several weeks. Found hypoxic, septic shock with L sided empyema.  Recent admission for traumatic ICH after fall and dc'd to SNF but left AMA. S/P VATS 2/20. PMH includes: ETOH, depression, compression fractures.  Clinical Impression  PTA pt living with wife and step-son with amputation in mobile home with 6 steps to enter. Pt has been in decline in mobility and self care since prior admission. Pt wife works 12 hours a day and can not provide needed assistance. Pt is currently limited in safe mobility by pain in his back and L side as well as decreased cognition, especially safety awareness in presence of decreased strength and endurance. Pt is min A for bed mobility but requires maxAx2 for standing and pivoting to recliner. PT recommending SNF level rehab at discharge. PT will continue to follow acutely.      Recommendations for follow up therapy are one component of a multi-disciplinary discharge planning process, led by the attending physician.  Recommendations may be updated based on patient status, additional functional criteria and insurance authorization.  Follow Up Recommendations Skilled nursing-short term rehab (<3 hours/day)    Assistance Recommended at Discharge Frequent or constant Supervision/Assistance  Patient can return home with the following  Two people to help with walking and/or transfers;Two people to help with bathing/dressing/bathroom;Assistance with cooking/housework;Assistance with feeding;Direct supervision/assist for medications management;Direct supervision/assist for financial management;Assist for transportation;Help with stairs or ramp for entrance    Equipment Recommendations Other (comment) (has necessary equipment)  Recommendations for  Other Services       Functional Status Assessment Patient has had a recent decline in their functional status and demonstrates the ability to make significant improvements in function in a reasonable and predictable amount of time.     Precautions / Restrictions Precautions Precautions: Fall Restrictions Weight Bearing Restrictions: No      Mobility  Bed Mobility Overal bed mobility: Needs Assistance Bed Mobility: Supine to Sit     Supine to sit: Min assist     General bed mobility comments: min A for managing lines and tubes as pt with decreased awareness    Transfers Overall transfer level: Needs assistance   Transfers: Sit to/from Stand Sit to Stand: +2 physical assistance, Max assist     Squat pivot transfers: Max assist, +2 physical assistance     General transfer comment: pt with increased LE strength limiting transfers, maxAx2 for power up to standing, once knees locked requires modAx2 for balance, sat back on EoB and requires maxAx2 for squat pivot to recliner, once final sit to stand for placement of clean pad        Balance Overall balance assessment: Needs assistance, History of Falls Sitting-balance support: Bilateral upper extremity supported, Single extremity supported, Feet supported Sitting balance-Leahy Scale: Fair     Standing balance support: Bilateral upper extremity supported Standing balance-Leahy Scale: Zero Standing balance comment: external rotation of bilateral hips in standing                             Pertinent Vitals/Pain Pain Assessment Pain Assessment: 0-10 Pain Score: 10-Worst pain ever Pain Location: back, L hip, L ribs Pain Descriptors / Indicators: Sharp, Shooting Pain Intervention(s): Limited activity within patient's tolerance, Monitored during session, Repositioned  Home Living Family/patient expects to be discharged to:: Private residence Living Arrangements: Spouse/significant other Available Help at  Discharge: Family;Available PRN/intermittently (wife works 12 hour shifts) Type of Home: Mobile home Home Access: Stairs to enter Entrance Stairs-Rails: Right;Left;Can reach both [employer] of Steps: 6   Home Layout: One level Home Equipment: Rollator (4 wheels);BSC/3in1;Crutches      Prior Function Prior Level of Function : Needs assist;History of Falls (last six months);Driving       Physical Assist : Mobility (physical);ADLs (physical)   ADLs (physical): IADLs Mobility Comments: uses RW for household ambulation, fell due to weakness in but unable to get up off the floor ADLs Comments: states his wife does cooking and cleaning and brings him his medicine     Hand Dominance   Dominant Hand: Right    Extremity/Trunk Assessment   Upper Extremity Assessment Upper Extremity Assessment: Defer to OT evaluation    Lower Extremity Assessment Lower Extremity Assessment: Generalized weakness (ROM WFL, pt requires UE to move LE)    Cervical / Trunk Assessment Cervical / Trunk Assessment: Other exceptions (compression fx)  Communication   Communication: No difficulties  Cognition Arousal/Alertness: Awake/alert Behavior During Therapy: WFL for tasks assessed/performed Overall Cognitive Status: No family/caregiver present to determine baseline cognitive functioning                                 General Comments: pt with preoccupation about lines and leads and continually asks time as pain medication is due soon, very difficult to follow progression of events        General Comments General comments (skin integrity, edema, etc.): VSS on RA        Assessment/Plan    PT Assessment Patient needs continued PT services  PT Problem List Decreased strength;Decreased activity tolerance;Decreased balance;Decreased mobility;Decreased coordination;Decreased cognition;Decreased safety awareness;Decreased knowledge of precautions;Decreased knowledge of use of  DME;Cardiopulmonary status limiting activity;Pain       PT Treatment Interventions Gait training;Stair training;Functional mobility training;Therapeutic activities;Therapeutic exercise;Balance training;Patient/family education;DME instruction    PT Goals (Current goals can be found in the Care Plan section)  Acute Rehab PT Goals Patient Stated Goal: less pain PT Goal Formulation: With patient Time For Goal Achievement: 09/27/21 Potential to Achieve Goals: Fair    Frequency Min 2X/week        AM-PAC PT "6 Clicks" Mobility  Outcome Measure Help needed turning from your back to your side while in a flat bed without using bedrails?: A Little Help needed moving from lying on your back to sitting on the side of a flat bed without using bedrails?: A Little Help needed moving to and from a bed to a chair (including a wheelchair)?: Total Help needed standing up from a chair using your arms (e.g., wheelchair or bedside chair)?: Total Help needed to walk in hospital room?: Total Help needed climbing 3-5 steps with a railing? : Total 6 Click Score: 10    End of Session Equipment Utilized During Treatment: Gait belt Activity Tolerance: Patient tolerated treatment well Patient left: in chair;with call bell/phone within reach;with chair alarm set Nurse Communication: Mobility status;Patient requests pain meds PT Visit Diagnosis: Unsteadiness on feet (R26.81);Other abnormalities of gait and mobility (R26.89);Repeated falls (R29.6);Muscle weakness (generalized) (M62.81);History of falling (Z91.81);Difficulty in walking, not elsewhere classified (R26.2);Adult, failure to thrive (R62.7);Pain Pain - Right/Left:  (L) Pain - part of body: Hip (L ribs and back)    Time: 2426-8341 PT  Time Calculation (min) (ACUTE ONLY): 32 min   Charges:   PT Evaluation $PT Eval Moderate Complexity: 1 Mod          Jenevie Casstevens B. Migdalia Dk PT, DPT Acute Rehabilitation Services Pager [phone] Office  [phone]   [place] 09/13/2021, 12:30 PM

## 2021-09-13 NOTE — Progress Notes (Addendum)
NAME:  Lance House, MRN:  [id number], DOB:  1951-04-13, LOS: 3 ADMISSION DATE:  09/10/2021, CONSULTATION DATE:  09/10/21 REFERRING MD:  Jari Pigg- EDP, CHIEF COMPLAINT:  loculated pleural effusion   History of Present Illness:  Lance House is a 71 y/o gentleman with a history of former tobacco abuse, ETOH abuse with recent admission for traumatic ICH after a fall who presented to the ED on 2/19 with complaints of intermittent SOB, weakness, left-sided chest pain that worsened over the past several weeks. Consistent with left sided empyema.   He decided to come to the ED from home because his pain never improved. When EMS arrived he was hypoxic to 86% on RA. He was recently admitted to the hospital from 1/24- 2/2 for an ICH and compression fractures suffered during a fall while intoxicated. He was discharged to [facility] and was brought back to the ED within about 2 hours, where he remained admitted in the ED from 2/2- 2/13 due to placement issues. He has chronic pain for which he takes oxycodone. He was transferred from [facility] ED to [facility] for TCTS evaluation with tentative plans for VATS (Video-assisted thoracoscopic surgery) with Dr. Roxan Hockey.   Pertinent  Medical History  ETOH abuse- quit 3 months ago Tobacco abuse- quit recently Gout Depression Compression fractures- thoracic and lumbar ICH- Feb 2023  Significant Hospital Events: Including procedures, antibiotic start and stop dates in addition to other pertinent events   2/19 admitted 2/20 TCTS performed VATS   Interim History / Subjective:  POD2 VATS  No longer on pressors  Left chest tube in place  Pain overnight and PRN oxycodone increased to 7.5 mg q4h  Net I/O -285  Objective   Blood pressure (!) 140/93, pulse 89, temperature 98.2 F (36.8 C), temperature source Oral, resp. rate 19, weight 80.7 kg, SpO2 96 %.    Vent Mode: PSV;CPAP FiO2 (%):  [36 %-50 %] 36 % PEEP:  [5 cmH20] 5 cmH20 Pressure Support:  [10 cmH20]  10 cmH20   Intake/Output Summary (Last 24 hours) at 09/13/2021 0723 Last data filed at 09/13/2021 0600 Gross per 24 hour  Intake 1053.93 ml  Output 1435 ml  Net -381.07 ml   Filed Weights   09/11/21 0500 09/12/21 0500 09/13/21 0500  Weight: 78.7 kg 80.1 kg 80.7 kg   Vitals last 24 hours:  T 97.5- 98.2 PR 70-99 RR 9-28 SBP 112-171 DBP 60-103 SpO2 89-100 on [place] 2 L   Examination: General: chronically ill appearing man lying in bed in NAD HENT: /AT, eyes anicteric Lungs: chest tube draining on left side  Cardiovascular: S1S2, reg rhythm Abdomen: soft, NT Extremities: chronic stasis dermatitis, no LE edema, no cyanosis.  Derm: multiple scabs and minor wounds on his feet Neuro: awake, globally weak, moving all extremities, following commands  Labs:  Na 132 mildly hyponatremic  K 4.4 wnl  Creatinine low 0.57 compared to yesterday 0.67  Corrected Calcium wnl 9.8   Mg 1.9 Phosphorous 2.3 slightly decreased   WBC leukocytosis improved from 22.9 yesterday to 11.1 this am  H/H 12.9/38.4   Resolved Hospital Problem list   Lactic acidosis Hypocalcemia  Mild AKI  Assessment & Plan:   Septic shock due to left-sided pneumonia  Left sided Empyema s/p VATS  Chest tube with 600 cc last 24 hours. Pleural culture shows gram positive cocci no growth in less than 24 hours  Overall improved and will transfer out of ICU today.  -con't broad antibiotics cefepime, metronidazole, vanc  and follow up on intrapleural cultures -Oxycodone PRN for pain; has long history of opiate use -Acetaminophen 1000 mg q6h daily  - Gabapentin 300 mg BID  - Lidocaine patch daily  - Continue chest tube  - Appreciate TCTS follow up  Acute hypoxic respiratory failure  Patient extubated yesterday. Not on home air.  - [place] for target SpO2 > 88 %  - mucinex - bronchodilators PRN; not on BD as OP. Needs OP PFTs  Hypertension  BP ranges over the last 24 hours SBP 112-171 DBP 60-103. Not on home BP  medications.  - Continue to monitor at this time may be due to uncontrolled pain.   Acute Urinary retention Improved on bethanechol  - Continue Bethanechol  - Monitor I/O  Hyponatremia  Na 132. Likely due to per oral intake.  - Monitor   H/o ETOH abuse No signs of withdrawal -vitamins  Hypophosphotemia  Phosphorous 2.3. Likely due to poor oral intake and anti-acid inhibition of phosphate  - Continue to monitor  - Discontinue Protonix 40 mg IV  - Replete phosphate   Mild protein energy malnutrition - PT/OT consult  -vitamins -RD consult  Unstageable decubitus ulcer - Wound care  Gout  Home medication allopurinol 300 mg. Patient states he has not been taking and cannot remember the last time he had a gout flare.   Best Practice (right click and "Reselect all SmartList Selections" daily)   Diet/type: Regular consistency (see orders) DVT prophylaxis: prophylactic heparin  GI prophylaxis: PPI Lines: N/A  Foley:  N/A Code Status:  full code Last date of multidisciplinary goals of care discussion [  ]  Labs   CBC: Recent Labs  Lab 09/10/21 1104 09/11/21 0159 09/11/21 1358 09/11/21 1636 09/11/21 1644 09/12/21 0239 09/13/21 0545  WBC 30.8* 28.2*  --   --  24.3* 22.9* 11.1*  NEUTROABS 28.1*  --   --   --  21.7* 21.2* 9.1*  HGB 15.6 14.0 14.6 14.3 13.7 13.1 12.9*  HCT 45.3 41.1 43.0 42.0 42.5 39.2 38.4*  MCV 89.9 91.7  --   --  96.6 95.6 95.0  PLT 384 332  --   --  348 353 379    Basic Metabolic Panel: Recent Labs  Lab 09/10/21 1104 09/10/21 1944 09/10/21 2346 09/11/21 0159 09/11/21 0452 09/11/21 1358 09/11/21 1636 09/11/21 1644 09/12/21 0239  NA 127* 129* 129*  --  136 136 135 136 133*  K 3.5 3.7 3.7  --  3.4* 4.5 4.6 4.7 4.8  CL 89* 97* 97*  --  108  --   --  104 105  CO2 28 22 23   --  19*  --   --  24 20*  GLUCOSE 103* 108* 100*  --  78  --   --  114* 127*  BUN 15 13 13   --  11  --   --  12 15  CREATININE 0.72 0.70 0.59*  --  0.58*  --   --   0.67 0.64  CALCIUM 8.2* 7.8* 7.7*  --  6.3*  --   --  8.1* 7.7*  MG 1.7  --   --  1.8  --   --   --  2.3 2.1  PHOS  --   --   --  4.3  --   --   --  4.6 3.6   GFR: Estimated Creatinine Clearance: 99.9 mL/min (by C-G formula based on SCr of 0.64 mg/dL). Recent Labs  Lab 09/10/21 1104 09/10/21  1324 09/11/21 0159 09/11/21 1644 09/12/21 0239 09/13/21 0545  PROCALCITON 0.82  --   --   --   --   --   WBC 30.8*  --  28.2* 24.3* 22.9* 11.1*  LATICACIDVEN 2.1* 1.4  --   --   --   --     Liver Function Tests: Recent Labs  Lab 09/10/21 1104 09/11/21 1644 09/12/21 0239  AST 25 19 19   ALT 20 16 15   ALKPHOS 84 77 68  BILITOT 1.1 0.5 0.8  PROT 6.0* 4.7* 4.4*  ALBUMIN 2.5* 1.6* 1.6*   No results for input(s): LIPASE, AMYLASE in the last 168 hours. Recent Labs  Lab 09/10/21 1104  AMMONIA 13    ABG    Component Value Date/Time   PHART 7.243 (L) 09/11/2021 1636   PCO2ART 56.3 (H) 09/11/2021 1636   PO2ART 83 09/11/2021 1636   HCO3 24.5 09/11/2021 1636   TCO2 26 09/11/2021 1636   ACIDBASEDEF 4.0 (H) 09/11/2021 1636   O2SAT 94 09/11/2021 1636     Coagulation Profile: Recent Labs  Lab 09/10/21 2346  INR 1.4*    Cardiac Enzymes: Recent Labs  Lab 09/10/21 1104  CKTOTAL 135    HbA1C: No results found for: HGBA1C  CBG: Recent Labs  Lab 09/12/21 1105 09/12/21 1534 09/12/21 1923 09/12/21 2331 09/13/21 0312  GLUCAP 104* 144* 116* 98 111*

## 2021-09-13 NOTE — Progress Notes (Signed)
[facility] Progress Note Patient Name: Lance House DOB: 30-Nov-1950 MRN: [id number]   Date of Service  09/13/2021  HPI/Events of Note  Pain - No relief with current Oxycodone IR dose of 5 mg.  eICU Interventions  Plan: Change Oxycodone IR to 7.5 mg PO Q 4 hours PRN severe pain.      Intervention Category Major Interventions: Other:  Caydyn Sprung Cornelia Copa 09/13/2021, 4:09 AM

## 2021-09-14 ENCOUNTER — Inpatient Hospital Stay (HOSPITAL_COMMUNITY): Payer: Medicare HMO

## 2021-09-14 ENCOUNTER — Encounter (HOSPITAL_COMMUNITY): Payer: Self-pay | Admitting: Radiology

## 2021-09-14 DIAGNOSIS — J869 Pyothorax without fistula: Secondary | ICD-10-CM | POA: Diagnosis not present

## 2021-09-14 LAB — BASIC METABOLIC PANEL
Anion gap: 7 (ref 5–15)
Anion gap: 5 (ref 5–15)
BUN: 8 mg/dL (ref 8–23)
BUN: <5 mg/dL — ABNORMAL LOW (ref 8–23)
CO2: 22 mmol/L (ref 22–32)
CO2: 29 mmol/L (ref 22–32)
Calcium: 7.4 mg/dL — ABNORMAL LOW (ref 8.9–10.3)
Calcium: 7.2 mg/dL — ABNORMAL LOW (ref 8.9–10.3)
Chloride: 98 mmol/L (ref 98–111)
Chloride: 94 mmol/L — ABNORMAL LOW (ref 98–111)
Creatinine, Ser: 0.55 mg/dL — ABNORMAL LOW (ref 0.61–1.24)
Creatinine, Ser: 0.53 mg/dL — ABNORMAL LOW (ref 0.61–1.24)
GFR, Estimated: >60 mL/min (ref >60)
GFR, Estimated: >60 mL/min (ref >60)
Glucose, Bld: 111 mg/dL — ABNORMAL HIGH (ref 70–99)
Glucose, Bld: 122 mg/dL — ABNORMAL HIGH (ref 70–99)
Potassium: 3.8 mmol/L (ref 3.5–5.1)
Potassium: 3.8 mmol/L (ref 3.5–5.1)
Sodium: 127 mmol/L — ABNORMAL LOW (ref 135–145)
Sodium: 128 mmol/L — ABNORMAL LOW (ref 135–145)

## 2021-09-14 LAB — CBC WITH DIFFERENTIAL/PLATELET
Abs Immature Granulocytes: 0.21 10*3/uL — ABNORMAL HIGH (ref 0.00–0.07)
Basophils Absolute: 0 10*3/uL (ref 0.0–0.1)
Basophils Relative: 0 %
Eosinophils Absolute: 0.1 10*3/uL (ref 0.0–0.5)
Eosinophils Relative: 1 %
HCT: 36.1 % — ABNORMAL LOW (ref 39.0–52.0)
Hemoglobin: 12.4 g/dL — ABNORMAL LOW (ref 13.0–17.0)
Immature Granulocytes: 2 %
Lymphocytes Relative: 12 %
Lymphs Abs: 1.2 10*3/uL (ref 0.7–4.0)
MCH: 31.9 pg (ref 26.0–34.0)
MCHC: 34.3 g/dL (ref 30.0–36.0)
MCV: 92.8 fL (ref 80.0–100.0)
Monocytes Absolute: 0.7 10*3/uL (ref 0.1–1.0)
Monocytes Relative: 7 %
Neutro Abs: 7.9 10*3/uL — ABNORMAL HIGH (ref 1.7–7.7)
Neutrophils Relative %: 78 %
Platelets: 315 10*3/uL (ref 150–400)
RBC: 3.89 MIL/uL — ABNORMAL LOW (ref 4.22–5.81)
RDW: 13.8 % (ref 11.5–15.5)
WBC: 10.1 10*3/uL (ref 4.0–10.5)
nRBC: 0 % (ref 0.0–0.2)

## 2021-09-14 LAB — MAGNESIUM: Magnesium: 1.6 mg/dL — ABNORMAL LOW (ref 1.7–2.4)

## 2021-09-14 LAB — PHOSPHORUS: Phosphorus: 2.4 mg/dL — ABNORMAL LOW (ref 2.5–4.6)

## 2021-09-14 IMAGING — DX DG CHEST 1V PORT
1 series · 1 of 1 positions shown · non-contrast
Comparison: Portable exam [CL] hours compared to [DATE]

CLINICAL DATA: Pneumothorax

EXAM:
PORTABLE CHEST 1 VIEW

[chest]
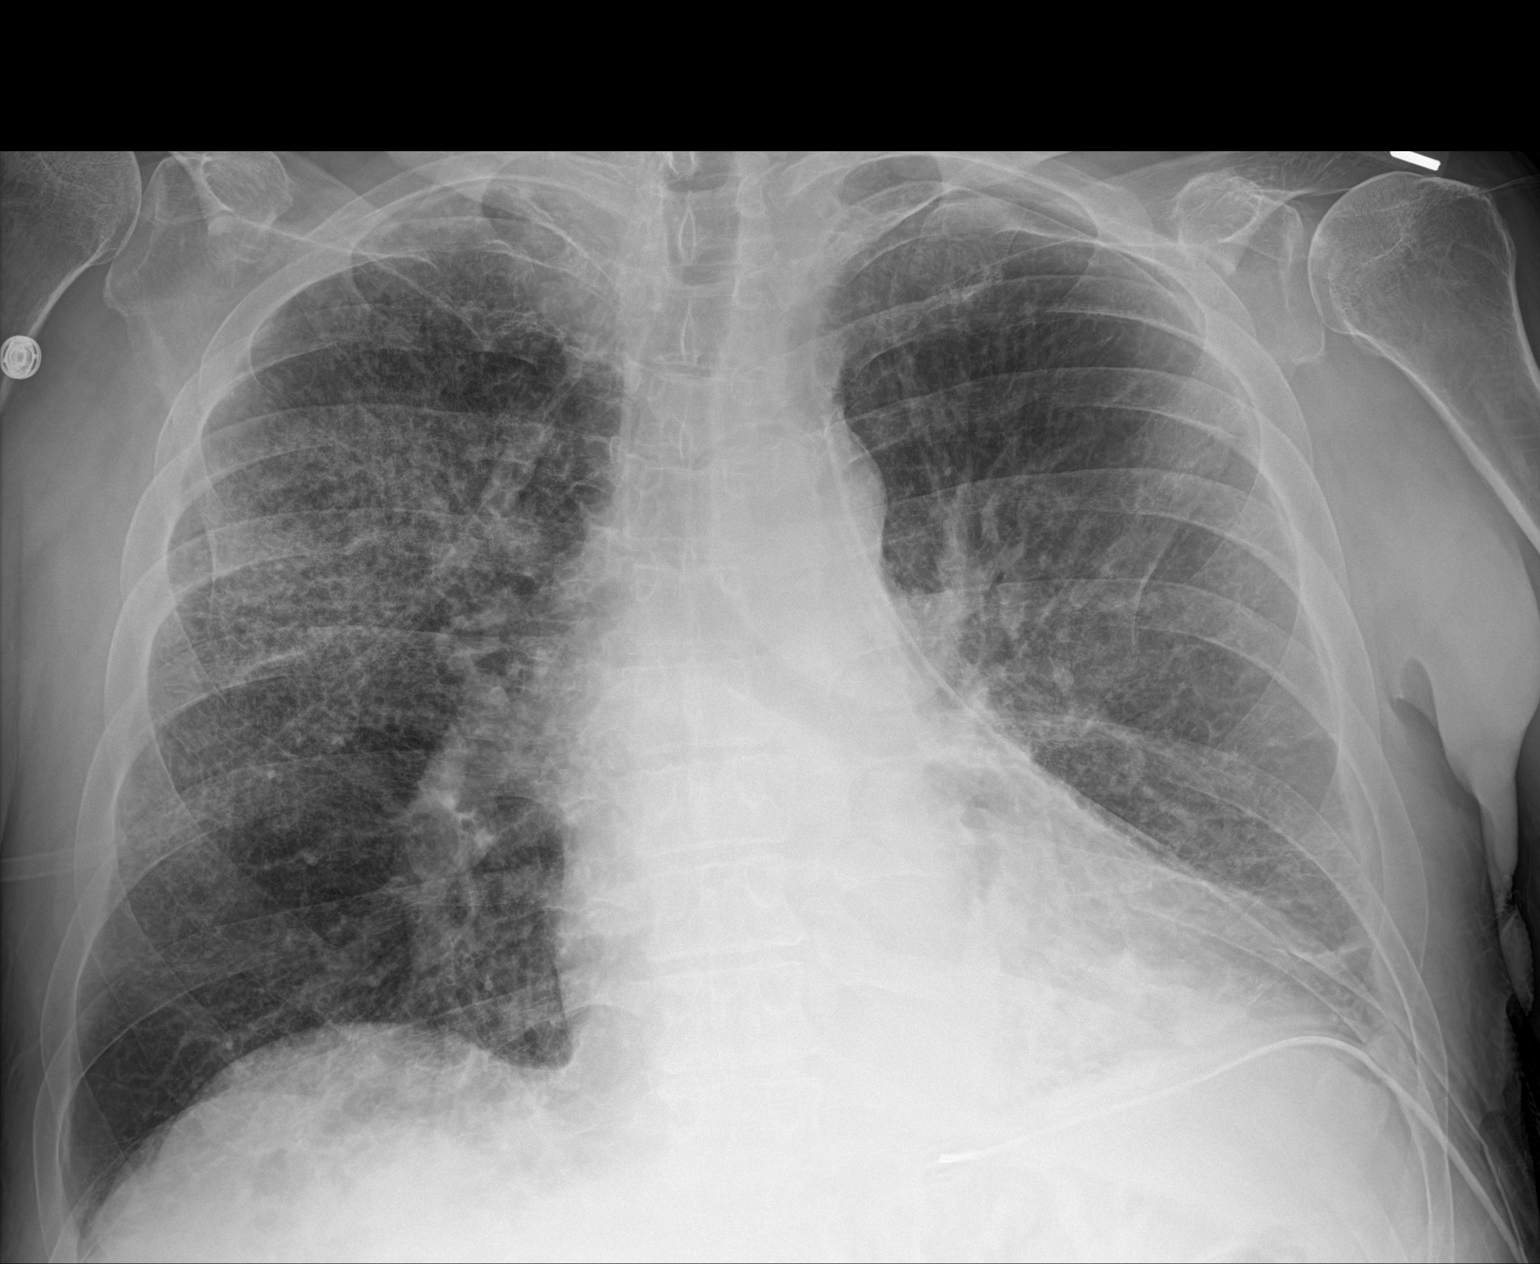

[1 of 1 positions shown; findings below may reference images not displayed]

FINDINGS: Pair of LEFT thoracostomy tubes unchanged.

Persistent small LEFT apex pneumothorax.

Upper normal heart size.

Mediastinal contours and pulmonary vascularity normal.

Persistent diffuse BILATERAL pulmonary infiltrates, greatest RIGHT
upper lobe.

No pleural effusion or acute osseous findings.

Bones demineralized.
IMPRESSION: Persistent small LEFT apex pneumothorax despite thoracostomy tubes.

## 2021-09-14 MED ORDER — SODIUM CHLORIDE 0.9 % IV SOLN
INTRAVENOUS | Status: DC
Start: 2021-09-14 — End: 2021-09-22

## 2021-09-14 MED ORDER — GUAIFENESIN-DM 100-10 MG/5ML PO SYRP
10.0000 mL | ORAL_SOLUTION | ORAL | Status: DC | PRN
  Administered 2021-09-14 – 2021-09-26 (×16): 10 mL via ORAL
  Filled 2021-09-14 (×16): qty 10

## 2021-09-14 MED ORDER — POTASSIUM PHOSPHATES 15 MMOLE/5ML IV SOLN
15.0000 mmol | Freq: Once | INTRAVENOUS | Status: AC
  Administered 2021-09-14: 15 mmol via INTRAVENOUS
  Filled 2021-09-14: qty 5

## 2021-09-14 MED ORDER — SODIUM CHLORIDE 0.9 % IV SOLN
2.0000 g | INTRAVENOUS | Status: DC
  Administered 2021-09-14 – 2021-09-26 (×13): 2 g via INTRAVENOUS
  Filled 2021-09-14 (×13): qty 20

## 2021-09-14 MED ORDER — MAGNESIUM SULFATE 4 GM/100ML IV SOLN
4.0000 g | Freq: Once | INTRAVENOUS | Status: AC
  Administered 2021-09-14: 4 g via INTRAVENOUS
  Filled 2021-09-14: qty 100

## 2021-09-14 MED ORDER — OXYCODONE HCL 5 MG PO TABS
10.0000 mg | ORAL_TABLET | ORAL | Status: DC | PRN
  Administered 2021-09-14 – 2021-09-18 (×22): 10 mg via ORAL
  Filled 2021-09-14 (×22): qty 2

## 2021-09-14 MED ORDER — KETOROLAC TROMETHAMINE 15 MG/ML IJ SOLN
15.0000 mg | Freq: Four times a day (QID) | INTRAMUSCULAR | Status: AC
  Administered 2021-09-14 – 2021-09-16 (×8): 15 mg via INTRAVENOUS
  Filled 2021-09-14 (×8): qty 1

## 2021-09-14 NOTE — Progress Notes (Signed)
[facility] hospitalist progress note   History of Present Illness:  Lance House is a 71 y/o gentleman with a history of former tobacco abuse, ETOH abuse with recent admission for traumatic ICH after a fall who presented to the ED on 2/19 with complaints of intermittent SOB, weakness, left-sided chest pain that worsened over the past several weeks. Consistent with left sided empyema.   He decided to come to the ED from home because his pain never improved. When EMS arrived he was hypoxic to 86% on RA. He was recently admitted to the hospital from 1/24- 2/2 for an ICH and compression fractures suffered during a fall while intoxicated. He was discharged to [facility] and was brought back to the ED within about 2 hours, where he remained admitted in the ED from 2/2- 2/13 due to placement issues. He has chronic pain for which he takes oxycodone. He was transferred from [facility] ED to [facility] for TCTS evaluation with tentative plans for VATS (Video-assisted thoracoscopic surgery) with Dr. Roxan Hockey.   Pertinent  Medical History  ETOH abuse- quit 3 months ago Tobacco abuse- quit recently Gout Depression Compression fractures- thoracic and lumbar ICH- Feb 2023  Significant Hospital Events: Including procedures, antibiotic start and stop dates in addition to other pertinent events   2/19 admitted 2/20 TCTS performed VATS   Interim History / Subjective:  POD2 VATS  No longer on pressors  Left chest tube in place  Pain overnight and PRN oxycodone increased to 7.5 mg q4h  Net I/O -285  Objective   Blood pressure 135/80, pulse 75, temperature (!) 96.9 F (36.1 C), temperature source Axillary, resp. rate 16, weight 80.6 kg, SpO2 93 %.        Intake/Output Summary (Last 24 hours) at 09/14/2021 0917 Last data filed at 09/14/2021 0800 Gross per 24 hour  Intake 2145.26 ml  Output 1855 ml  Net 290.26 ml   Filed Weights   09/12/21 0500 09/13/21 0500 09/14/21 0342  Weight: 80.1 kg 80.7 kg 80.6  kg   Vitals last 24 hours:  T 97.5- 98.2 PR 70-99 RR 9-28 SBP 112-171 DBP 60-103 SpO2 89-100 on [place] 2 L   Examination: General: chronically ill appearing man lying in bed in NAD HENT: [place]/AT, eyes anicteric Lungs: chest tube draining on left side  Cardiovascular: S1S2, reg rhythm Abdomen: soft, NT Extremities: chronic stasis dermatitis, no LE edema, no cyanosis.  Derm: multiple scabs and minor wounds on his feet Neuro: awake, globally weak, moving all extremities, following commands  Labs:  Na 132 mildly hyponatremic  K 4.4 wnl  Creatinine low 0.57 compared to yesterday 0.67  Corrected Calcium wnl 9.8   Mg 1.9 Phosphorous 2.3 slightly decreased   WBC leukocytosis improved from 22.9 yesterday to 11.1 this am  H/H 12.9/38.4   Resolved Hospital Problem list   Lactic acidosis Hypocalcemia  Mild AKI  Assessment & Plan:   Septic shock due to left-sided pneumonia  Left sided Empyema s/p VATS  Chest tube with 600 cc last 24 hours. Pleural culture shows gram positive cocci no growth in less than 24 hours  Overall improved and will transfer out of ICU today.  - broad antibiotics cefepime, metronidazole, vanc and follow up on intrapleural cultures de-escalate to 09/14/2021 to ceftriaxone patient on culture data -Oxycodone PRN for pain; has long history of opiate use -Acetaminophen 1000 mg q6h daily  - Gabapentin 300 mg BID  - Lidocaine patch daily  - Continue chest tube  - Appreciate  TCTS follow up  Acute hypoxic respiratory failure  Patient extubated. Not on home air.  - [place] for target SpO2 > 88 %  - mucinex - bronchodilators PRN; not on BD as OP. Needs OP PFTs  Hypertension  BP ranges over the last 24 hours SBP 112-171 DBP 60-103. Not on home BP medications.  - Continue to monitor at this time may be due to uncontrolled pain.   Acute Urinary retention Improved on bethanechol  - Continue Bethanechol  - Monitor I/O  Hyponatremia  Na 132. Likely due to per oral  intake.  - Monitor   H/o ETOH abuse No signs of withdrawal -vitamins  Hypophosphotemia  Phosphorous 2.3. Likely due to poor oral intake and anti-acid inhibition of phosphate  - Continue to monitor  - Discontinue Protonix 40 mg IV  - Replete phosphate   Mild protein energy malnutrition - PT/OT consult  -vitamins -RD consult  Unstageable decubitus ulcer - Wound care  Gout  Home medication allopurinol 300 mg. Patient states he has not been taking and cannot remember the last time he had a gout flare.   Best Practice (right click and "Reselect all SmartList Selections" daily)   Diet/type: Regular consistency (see orders) DVT prophylaxis: prophylactic heparin  GI prophylaxis: PPI Lines: N/A  Foley:  N/A Code Status:  full code Last date of multidisciplinary goals of care discussion [  ]  Labs   CBC: Recent Labs  Lab 09/10/21 1104 09/11/21 0159 09/11/21 1358 09/11/21 1636 09/11/21 1644 09/12/21 0239 09/13/21 0545 09/14/21 0156  WBC 30.8* 28.2*  --   --  24.3* 22.9* 11.1* 10.1  NEUTROABS 28.1*  --   --   --  21.7* 21.2* 9.1* 7.9*  HGB 15.6 14.0   < > 14.3 13.7 13.1 12.9* 12.4*  HCT 45.3 41.1   < > 42.0 42.5 39.2 38.4* 36.1*  MCV 89.9 91.7  --   --  96.6 95.6 95.0 92.8  PLT 384 332  --   --  348 353 317 315   < > = values in this interval not displayed.    Basic Metabolic Panel: Recent Labs  Lab 09/11/21 0159 09/11/21 0452 09/11/21 1358 09/11/21 1636 09/11/21 1644 09/12/21 0239 09/13/21 0545 09/14/21 0156  NA  --  136   < > 135 136 133* 132* 127*  K  --  3.4*   < > 4.6 4.7 4.8 4.4 3.8  CL  --  108  --   --  104 105 102 98  CO2  --  19*  --   --  24 20* 25 22  GLUCOSE  --  78  --   --  114* 127* 85 111*  BUN  --  11  --   --  12 15 15 8   CREATININE  --  0.58*  --   --  0.67 0.64 0.57* 0.55*  CALCIUM  --  6.3*  --   --  8.1* 7.7* 8.0* 7.4*  MG 1.8  --   --   --  2.3 2.1 1.9 1.6*  PHOS 4.3  --   --   --  4.6 3.6 2.3* 2.4*   < > = values in this  interval not displayed.   GFR: Estimated Creatinine Clearance: 99.9 mL/min (A) (by C-G formula based on SCr of 0.55 mg/dL (L)). Recent Labs  Lab 09/10/21 1104 09/10/21 1324 09/11/21 0159 09/11/21 1644 09/12/21 0239 09/13/21 0545 09/14/21 0156  PROCALCITON 0.82  --   --   --   --   --   --  WBC 30.8*  --    < > 24.3* 22.9* 11.1* 10.1  LATICACIDVEN 2.1* 1.4  --   --   --   --   --    < > = values in this interval not displayed.    Liver Function Tests: Recent Labs  Lab 09/10/21 1104 09/11/21 1644 09/12/21 0239 09/13/21 0545  AST 25 19 19 18   ALT 20 16 15 17   ALKPHOS 84 77 68 67  BILITOT 1.1 0.5 0.8 0.2*  PROT 6.0* 4.7* 4.4* 4.8*  ALBUMIN 2.5* 1.6* 1.6* 1.8*   No results for input(s): LIPASE, AMYLASE in the last 168 hours. Recent Labs  Lab 09/10/21 1104  AMMONIA 13    ABG    Component Value Date/Time   PHART 7.243 (L) 09/11/2021 1636   PCO2ART 56.3 (H) 09/11/2021 1636   PO2ART 83 09/11/2021 1636   HCO3 24.5 09/11/2021 1636   TCO2 26 09/11/2021 1636   ACIDBASEDEF 4.0 (H) 09/11/2021 1636   O2SAT 94 09/11/2021 1636     Coagulation Profile: Recent Labs  Lab 09/10/21 2346  INR 1.4*    Cardiac Enzymes: Recent Labs  Lab 09/10/21 1104  CKTOTAL 135    HbA1C: No results found for: HGBA1C  CBG: Recent Labs  Lab 09/12/21 1105 09/12/21 1534 09/12/21 1923 09/12/21 2331 09/13/21 0312  GLUCAP 104* 144* 116* 98 111*

## 2021-09-14 NOTE — Discharge Instructions (Addendum)
Follow with [facility] in 7 days, you need an outpatient MRI of the brain to be ordered by a family physician.  In the next 1 to 2 weeks  Get CBC, CMP, 2 view Chest X ray -  checked next visit within 1 week by Primary MD    Activity: As tolerated with Full fall precautions use walker/cane & assistance as needed  Disposition Home     Diet: Heart Healthy with feeding assistance and aspiration precautions.    Special Instructions: If you have smoked or chewed Tobacco  in the last 2 yrs please stop smoking, stop any regular Alcohol  and or any Recreational drug use.  On your next visit with your primary care physician please Get Medicines reviewed and adjusted.  Please request your Prim.MD to go over all Hospital Tests and Procedure/Radiological results at the follow up, please get all Hospital records sent to your Prim MD by signing hospital release before you go home.  If you experience worsening of your admission symptoms, develop shortness of breath, life threatening emergency, suicidal or homicidal thoughts you must seek medical attention immediately by calling 911 or calling your MD immediately  if symptoms less severe.  You Must read complete instructions/literature along with all the possible adverse reactions/side effects for all the Medicines you take and that have been prescribed to you. Take any new Medicines after you have completely understood and accpet all the possible adverse reactions/side effects.      Video-Assisted Thoracic Surgery, Care After What can I expect after the procedure? After the procedure, it is common to have: Some pain and soreness in your chest. Pain when you breathe in or cough. Trouble pooping (constipation). Tiredness. Trouble sleeping. Follow these instructions at home: Preventing lung infection Take deep breaths and cough often. This helps clear mucus and opens your lungs. Doing this helps prevent lung infection  (pneumonia). Use an incentive spirometer if told. This tool shows how much you fill your lungs with each breath. Coughing may hurt less if you try to support your chest. Try one of these when you cough: Hold a pillow on your chest. Place both hands flat on your cut or cuts from surgery (incisions). Do not smoke or use any products that contain nicotine or tobacco. If you need help quitting, ask your doctor. Stay away when people are smoking (avoid secondhand smoke). Medicines Take over-the-counter and prescription medicines only as told by your doctor. If you have pain, take pain medicine before your pain gets very bad. This is important. Doing this will help you breathe and cough more comfortably. If you were prescribed an antibiotic medicine, take it as told by your doctor. Do not stop using the antibiotic even if you start to feel better. If told, take steps to prevent problems with pooping (constipation). You may need to: Drink enough fluid to keep your pee (urine) pale yellow. Take medicines. You will be told what medicines to take. Eat foods that are high in fiber. These include beans, whole grains, and fresh fruits and vegetables. Limit foods that are high in fat and sugar. These include fried or sweet foods. Ask your doctor if you should avoid driving or using machines while you are taking your medicine. Bathing Do not take baths, swim, or use a hot tub. Ask your doctor about taking showers or sponge baths. Caring for your incision from surgery Follow instructions from your doctor about how to take care of your incision. Make sure you:  Wash your hands with soap and water for at least 20 seconds before and after you change your bandage. If you cannot use soap and water, use hand sanitizer. Change your bandage as told. Leave stitches, skin glue, or staples in place for at least 2 weeks. Leave tape strips alone unless you are told to take them off. You may trim the edges of the tape  strips if they curl up. Keep your bandage dry until it is taken off. Check your incision every day for signs of infection. Check for: Redness, swelling, or more pain. Fluid or blood. Warmth. Pus or a bad smell. Activity Avoid activities that use your chest muscles for at least 3-4 weeks. Do not lift anything that is heavier than 10 lb (4.5 kg), or the limit that you are told. Return to your normal activities when your doctor says that it is safe. Rest as told by your doctor. Get up to take short walks every 1 to 2 hours. Ask for help if you feel weak or unsteady. Do exercises as told by your doctor. General instructions If you were given a sedative during your procedure, do not drive or use machines until your doctor says that it is safe. A sedative is a medicine that helps you relax. If you have a chest tube, care for it as told. Do not travel by airplane during the 2 weeks after your chest tube is taken out, or until your doctor says that this is safe. Keep all follow-up visits. Contact a doctor if: You have any of these signs of infection around an incision: Redness, swelling, or more pain. Fluid or blood. Warmth. Pus or a bad smell. You have a fever or chills. You feel like you may vomit or you vomit. Your pain does not get better with medicine. Get help right away if: You have chest pain. You have fast or uneven heartbeats. You get a rash. You are short of breath. You have trouble breathing. You are mixed up (confused). You have trouble talking. You feel weak, light-headed, or dizzy. You faint. These symptoms may be an emergency. Get help right away. Call your local emergency services (911 in the [place] Do not wait to see if the symptoms will go away. Do not drive yourself to the hospital. Summary Take deep breaths and cough often. This helps clear mucus and opens your lungs. Doing this helps prevent lung infection (pneumonia). If you have pain, take pain medicine  before your pain gets very bad. Check your incision from surgery every day for signs of infection. Contact a doctor if you have signs of infection. Return to your normal activities when your doctor says that it is safe. This information is not intended to replace advice given to you by your health care provider. Make sure you discuss any questions you have with your health care provider. Document Revised: 04/01/2020 Document Reviewed: 04/01/2020 Elsevier Patient Education  2022 [employer].

## 2021-09-14 NOTE — Progress Notes (Addendum)
TCTS DAILY ICU PROGRESS NOTE                   [address]            [place] [address]          [phone]   3 Days Post-Op Procedure(s) (LRB): VIDEO ASSISTED THORACOSCOPY (VATS)/DECORTICATION (Left)  Total Length of Stay:  LOS: 4 days   Subjective: Patient still with a lot of pain-he states "I was on  a pain medication 20 mg every 4 hours PRN at [place]".  Objective: Vital signs in last 24 hours: Temp:  [97.5 F (36.4 C)-98.2 F (36.8 C)] 97.5 F (36.4 C) (02/23 0331) Pulse Rate:  [75-89] 75 (02/23 0331) Cardiac Rhythm: Normal sinus rhythm (02/23 0340) Resp:  [15-19] 16 (02/23 0331) BP: (114-139)/(80-89) 135/80 (02/23 0331) SpO2:  [91 %-98 %] 93 % (02/23 0331) Weight:  [80.6 kg] 80.6 kg (02/23 0342)  Filed Weights   09/12/21 0500 09/13/21 0500 09/14/21 0342  Weight: 80.1 kg 80.7 kg 80.6 kg    Weight change: -0.1 kg      Intake/Output from previous day: 02/22 0701 - 02/23 0700 In: 2285.3 [P.O.:1197; I.V.:86.4; IV Piggyback:1001.9] Out: 1430 [Urine:1100; Chest Tube:330]  Intake/Output this shift: No intake/output data recorded.  Current Meds: Scheduled Meds:  acetaminophen  1,000 mg Oral Q6H   bethanechol  5 mg Oral TID   bisacodyl  10 mg Oral Daily   Chlorhexidine Gluconate Cloth  6 each Topical Daily   docusate sodium  100 mg Oral BID   folic acid  1 mg Oral Daily   gabapentin  300 mg Oral BID   heparin  5,000 Units Subcutaneous Q8H   lidocaine  1 patch Transdermal Q24H   mouth rinse  15 mL Mouth Rinse BID   multivitamin with minerals  1 tablet Oral Daily   senna-docusate  1 tablet Oral QHS   thiamine  100 mg Oral Daily   Continuous Infusions:  sodium chloride     sodium chloride 50 mL/hr at 09/14/21 0600   ceFEPime (MAXIPIME) IV Stopped (09/14/21 0350)   vancomycin Stopped (09/13/21 2158)   PRN Meds:.albuterol, clonazePAM, guaiFENesin-dextromethorphan, ipratropium-albuterol, ondansetron (ZOFRAN) IV, oxyCODONE  General  appearance: no distress. Patient awake and alert  Heart: RRR Lungs: Coarse breath sounds on left Abdomen: Soft, non tender, bowel sounds Extremities: SCDs Wound: Aquacel intact but partially removed. Wound is clean and dry;no sign of infection.  Lab Results: CBC: Recent Labs    09/13/21 0545 09/14/21 0156  WBC 11.1* 10.1  HGB 12.9* 12.4*  HCT 38.4* 36.1*  PLT 317 315    BMET:  Recent Labs    09/13/21 0545 09/14/21 0156  NA 132* 127*  K 4.4 3.8  CL 102 98  CO2 25 22  GLUCOSE 85 111*  BUN 15 8  CREATININE 0.57* 0.55*  CALCIUM 8.0* 7.4*     CMET: Lab Results  Component Value Date   WBC 10.1 09/14/2021   HGB 12.4 (L) 09/14/2021   HCT 36.1 (L) 09/14/2021   PLT 315 09/14/2021   GLUCOSE 111 (H) 09/14/2021   TRIG 51 09/12/2021   ALT 17 09/13/2021   AST 18 09/13/2021   NA 127 (L) 09/14/2021   K 3.8 09/14/2021   CL 98 09/14/2021   CREATININE 0.55 (L) 09/14/2021   BUN 8 09/14/2021   CO2 22 09/14/2021   TSH 1.319 08/15/2021   INR 1.4 (H) 09/10/2021      PT/INR:  No results for input(s): LABPROT, INR in the last 72 hours.  Radiology: No results found.   Assessment/Plan: S/P Procedure(s) (LRB): VIDEO ASSISTED THORACOSCOPY (VATS)/DECORTICATION (Left) CV-SR.  Pulmonary-On 2 liters of oxygen via [place]. Chest tubes with 330 cc last 24 hours. Chest tubes are to water seal, tidling with cough but no air leak . CXR this am appears stable (trace left apical pneumothorax, RUL airspace opacities) . Hope to remove 1 chest tube (anterior). Gram stain shows rare gram positive cocci;culture left pleural peel shows rare Strep Intermedius ID-WBC this am decreased to 10,100. On Cefepime and Vancomycin. Blood culture no growth 4 days 4. History of alcohol abuse-CIWA protocol 5. On Heparin 5000 units tid for DVT prophylaxis 6. Regarding pain control, has Lidocaine patch, Oxy IR 7.5 mg Q 4 PRN, and Gabapentin. Will increase Oxy IR to 10 mg every 4 hours PRN for now 7.  Hyponatremia-sodium decreased to 127. Per CCM, given 0.9 NaCl IVF 8. Deconditioned-continue PT/OT  Nani Skillern PA-C 09/14/2021 7:01 AM   Patient seen and examined, agree with above Keep both chest tubes- will place to water seal Deconditioning- PT/OT Will add toradol for 48 hours  Remo Lipps C. Roxan Hockey, MD [facility] [phone]

## 2021-09-14 NOTE — Plan of Care (Signed)
°  Problem: Education: Goal: Knowledge of General Education information will improve Description: Including pain rating scale, medication(s)/side effects and non-pharmacologic comfort measures Outcome: Progressing   Problem: Health Behavior/Discharge Planning: Goal: Ability to manage health-related needs will improve Outcome: Not Progressing   Problem: Clinical Measurements: Goal: Ability to maintain clinical measurements within normal limits will improve Outcome: Progressing Goal: Diagnostic test results will improve Outcome: Not Progressing Goal: Respiratory complications will improve Outcome: Progressing

## 2021-09-14 NOTE — Progress Notes (Signed)
[facility] ADULT ICU REPLACEMENT PROTOCOL   The patient does apply for the [facility] Adult ICU Electrolyte Replacment Protocol based on the criteria listed below:   1.Exclusion criteria: TCTS patients, ECMO patients, and Dialysis patients 2. Is GFR >/= 30 ml/min? Yes.    Patient's GFR today is >60 3. Is SCr </= 2? Yes.   Patient's SCr is 0.55 mg/dL 4. Did SCr increase >/= 0.5 in 24 hours? No. 5.Pt's weight >40kg  Yes.   6. Abnormal electrolyte(s):   Mg 1.6  7. Electrolytes replaced per protocol 8.  Call MD STAT for K+ </= 2.5, Phos </= 1, or Mag </= 1 Physician:  S. Rosie Fate R Cheresa Siers 09/14/2021 3:17 AM

## 2021-09-14 NOTE — Progress Notes (Signed)
[facility] Progress Note Patient Name: Pankaj Haack DOB: [birth year] MRN: [id number]   Date of Service  09/14/2021  HPI/Events of Note  Hyponatremia - Na+ = 127. Felt to be d/t poor PO intake.   eICU Interventions  Plan: 0.9 NaCl IV infusion at 50 mL/hour.      Intervention Category Major Interventions: Electrolyte abnormality - evaluation and management  Lichelle Viets [place] 09/14/2021, 3:28 AM

## 2021-09-14 NOTE — Progress Notes (Signed)
[facility] Progress Note Patient Name: Lance House DOB: 1951-06-18 MRN: [id number]   Date of Service  09/14/2021  HPI/Events of Note  Patient requests cough suppressant/  eICU Interventions  Plan: Robitussin DM 10 mL PO Q 4 hours PRN cough.      Intervention Category Major Interventions: Other:  Capucine Tryon Cornelia Copa 09/14/2021, 2:15 AM

## 2021-09-15 ENCOUNTER — Inpatient Hospital Stay (HOSPITAL_COMMUNITY): Payer: Medicare HMO

## 2021-09-15 DIAGNOSIS — J869 Pyothorax without fistula: Secondary | ICD-10-CM | POA: Diagnosis not present

## 2021-09-15 LAB — BASIC METABOLIC PANEL
Anion gap: 8 (ref 5–15)
BUN: <5 mg/dL — ABNORMAL LOW (ref 8–23)
CO2: 27 mmol/L (ref 22–32)
Calcium: 7.8 mg/dL — ABNORMAL LOW (ref 8.9–10.3)
Chloride: 99 mmol/L (ref 98–111)
Creatinine, Ser: 0.4 mg/dL — ABNORMAL LOW (ref 0.61–1.24)
GFR, Estimated: >60 mL/min (ref >60)
Glucose, Bld: 80 mg/dL (ref 70–99)
Potassium: 4 mmol/L (ref 3.5–5.1)
Sodium: 134 mmol/L — ABNORMAL LOW (ref 135–145)

## 2021-09-15 LAB — CBC WITH DIFFERENTIAL/PLATELET
Abs Immature Granulocytes: 0.23 10*3/uL — ABNORMAL HIGH (ref 0.00–0.07)
Basophils Absolute: 0.1 10*3/uL (ref 0.0–0.1)
Basophils Relative: 1 %
Eosinophils Absolute: 0.2 10*3/uL (ref 0.0–0.5)
Eosinophils Relative: 2 %
HCT: 44.6 % (ref 39.0–52.0)
Hemoglobin: 14.9 g/dL (ref 13.0–17.0)
Immature Granulocytes: 2 %
Lymphocytes Relative: 13 %
Lymphs Abs: 1.5 10*3/uL (ref 0.7–4.0)
MCH: 31 pg (ref 26.0–34.0)
MCHC: 33.4 g/dL (ref 30.0–36.0)
MCV: 92.7 fL (ref 80.0–100.0)
Monocytes Absolute: 0.6 10*3/uL (ref 0.1–1.0)
Monocytes Relative: 5 %
Neutro Abs: 8.8 10*3/uL — ABNORMAL HIGH (ref 1.7–7.7)
Neutrophils Relative %: 77 %
Platelets: 287 10*3/uL (ref 150–400)
RBC: 4.81 MIL/uL (ref 4.22–5.81)
RDW: 13.7 % (ref 11.5–15.5)
WBC: 11.4 10*3/uL — ABNORMAL HIGH (ref 4.0–10.5)
nRBC: 0 % (ref 0.0–0.2)

## 2021-09-15 LAB — MAGNESIUM: Magnesium: 1.8 mg/dL (ref 1.7–2.4)

## 2021-09-15 LAB — CULTURE, BLOOD (ROUTINE X 2)
Culture: NO GROWTH
Culture: NO GROWTH
Special Requests: ADEQUATE

## 2021-09-15 LAB — PHOSPHORUS: Phosphorus: 2.9 mg/dL (ref 2.5–4.6)

## 2021-09-15 IMAGING — DX DG CHEST 1V PORT
1 series · 1 of 1 positions shown · non-contrast
Comparison: Radiograph [DATE]

CLINICAL DATA: Pneumothorax, chest tube.

EXAM:
PORTABLE CHEST 1 VIEW

[chest]
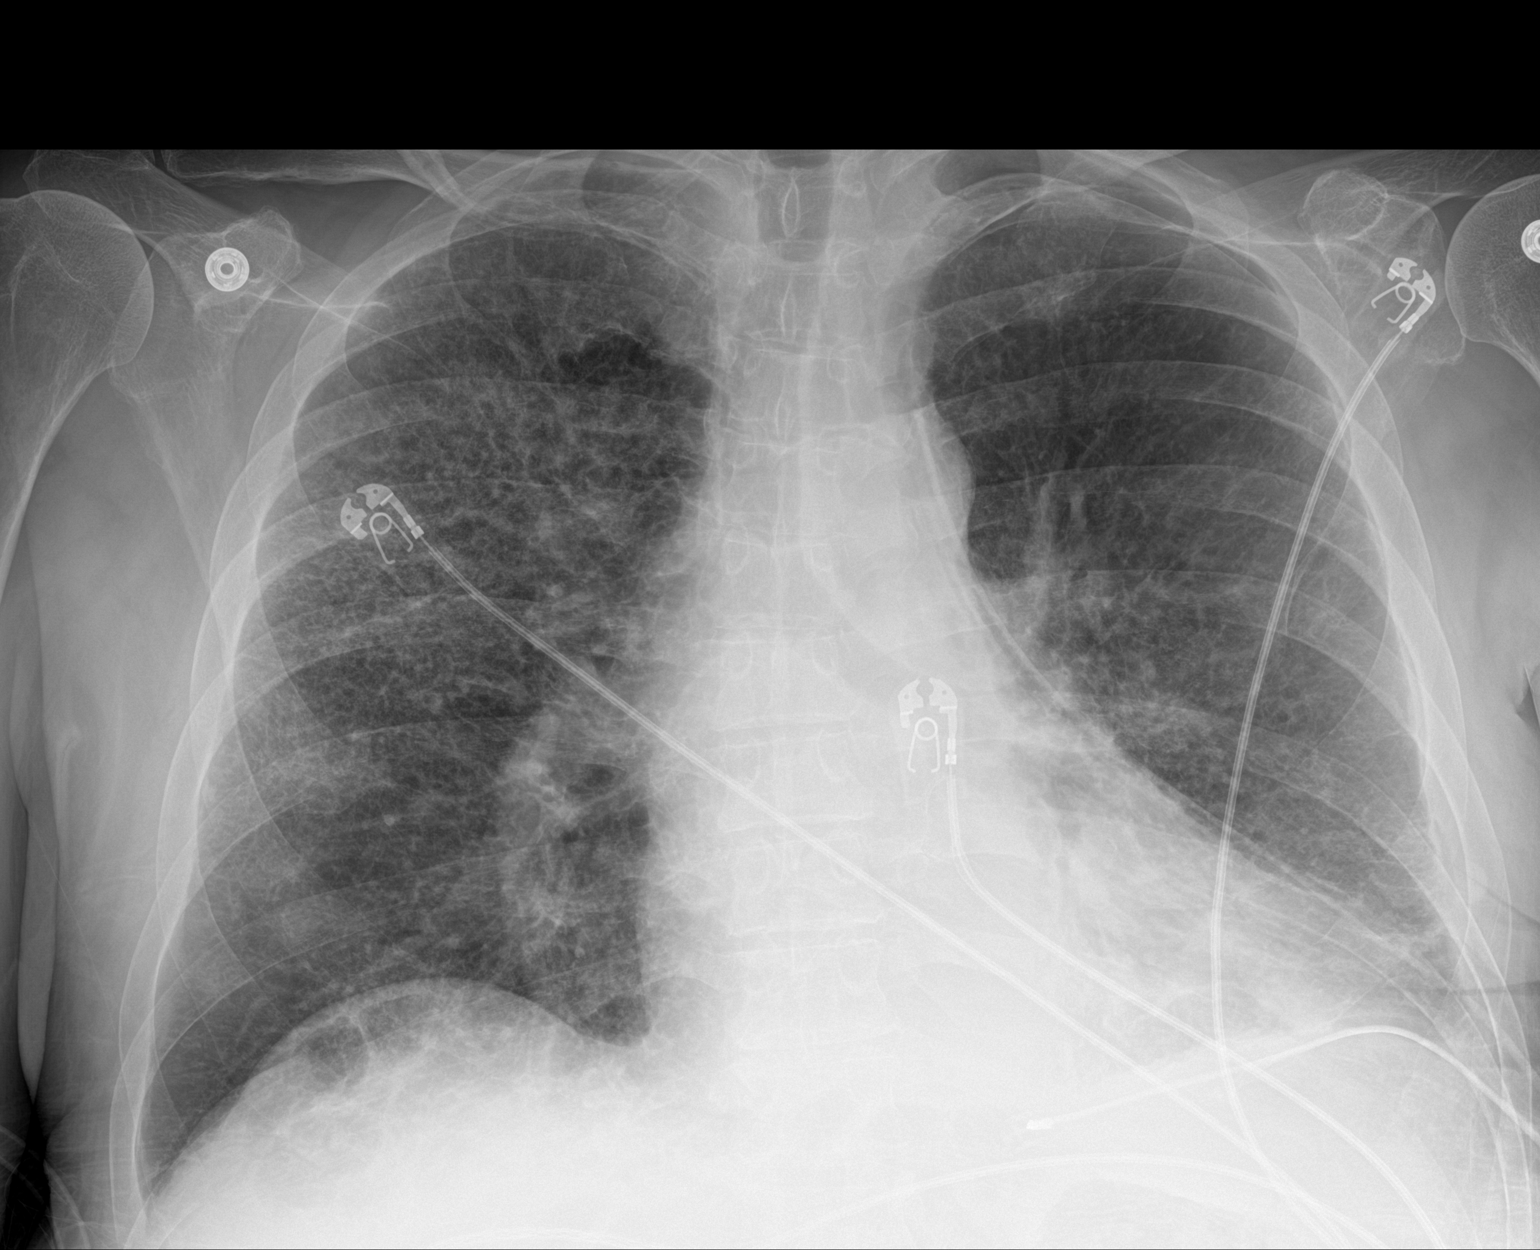

[1 of 1 positions shown; findings below may reference images not displayed]

FINDINGS: Unchanged position of the 2 left-sided thoracostomy tubes.
Persistent small left apical pneumothorax.

The heart size and mediastinal contours are unchanged.

Similar diffuse bilateral interstitial and alveolar opacities. No
visible pleural effusion.

The visualized skeletal structures are unchanged.
IMPRESSION: 1. Persistent small left apical pneumothorax.
2. Similar diffuse bilateral interstitial and alveolar opacities.

## 2021-09-15 NOTE — Progress Notes (Addendum)
° °   °  [address]       [place] [address]             [phone]       4 Days Post-Op Procedure(s) (LRB): VIDEO ASSISTED THORACOSCOPY (VATS)/DECORTICATION (Left)  Subjective: Patient eating breakfast. Has complaints of pain left lower ribs and where injections given  Objective: Vital signs in last 24 hours: Temp:  [96.9 F (36.1 C)-98.6 F (37 C)] 98.1 F (36.7 C) (02/24 0057) Pulse Rate:  [72-90] 90 (02/24 0623) Cardiac Rhythm: Normal sinus rhythm (02/23 2033) Resp:  [12-20] 19 (02/24 0623) BP: (115-157)/(77-97) 157/97 (02/24 0623) SpO2:  [92 %-96 %] 93 % (02/24 0623)     Intake/Output from previous day: 02/23 0701 - 02/24 0700 In: 949.5 [I.V.:599.7; IV Piggyback:299.9] Out: 3405 [Urine:3205; Chest Tube:200]   Physical Exam:  Cardiovascular: RRR Pulmonary: Clear to auscultation on right and slightly diminished right apex Abdomen: Soft, non tender, bowel sounds present. Extremities: Chronic venous stasis changes, no LE edema Wounds: Clean and dry.  No erythema or signs of infection. Chest Tubes:to water seal, no air leak  Lab Results: CBC: Recent Labs    09/14/21 0156 09/15/21 0525  WBC 10.1 11.4*  HGB 12.4* 14.9  HCT 36.1* 44.6  PLT 315 287   BMET:  Recent Labs    09/14/21 1115 09/15/21 0525  NA 128* 134*  K 3.8 4.0  CL 94* 99  CO2 29 27  GLUCOSE 122* 80  BUN <5* <5*  CREATININE 0.53* 0.40*  CALCIUM 7.2* 7.8*    PT/INR: No results for input(s): LABPROT, INR in the last 72 hours. ABG:  INR: Will add last result for INR, ABG once components are confirmed Will add last 4 CBG results once components are confirmed  Assessment/Plan: CV-SR, ST at times. Pulmonary-On 2 liters of oxygen via [place]. Chest tubes with 200 cc last 24 hours (light yellowish drainage). Chest tubes are to water seal, no air leak . CXR this am appears stable (small left apical pneumothorax, RUL airspace opacities) . Hope to remove anterior chest tube. Gram  stain shows rare gram positive cocci;culture left pleural peel shows rare Strep Intermedius ID-WBC this am 11,400. On Ceftriaxone.  4. History of alcohol abuse-CIWA protocol 5. On Heparin 5000 units tid for DVT prophylaxis 6. Regarding pain control, has Lidocaine patch, Oxy IR PRN and scheduled Toradol, and Gabapentin 300 mg bid 7. Hyponatremia-sodium up to 134 8. Deconditioned-continue PT/OT    Donielle M ZimmermanPA-C 09/15/2021,6:59 AM Patient seen and examined, agree with above Will dc anterior tube (diaphragmatic) today  Remo Lipps C. Roxan Hockey, MD [facility] [phone]

## 2021-09-15 NOTE — Progress Notes (Signed)
Occupational Therapy Treatment Patient Details Name: Rome Echavarria MRN: [id number] DOB: [birth year] Today's Date: 09/15/2021   History of present illness Pt is a 71 y/o male presenting 2/19 with SOB, weakness, L sided chest pain for several weeks. Found hypoxic, septic shock with L sided empyema.  Recent admission for traumatic ICH after fall and dc'd to SNF but left AMA. S/P VATS 2/20. PMH includes: ETOH, depression, compression fractures.   OT comments  Pt seated in recliner upon entry.  Engaged in ADLs from sitting position, max assist for LB dressing and setup for grooming.  Completed chair pushups x 5 reps 2 sets, min assist required with minimal clearance of hips.  Pt with poor motivation and requires maximal encouragement to engage.  Pt reports "you are ruining my pain medication"- educated on importance of mobility and pt continues to decline moving from recliner.  Will follow acutely.    Recommendations for follow up therapy are one component of a multi-disciplinary discharge planning process, led by the attending physician.  Recommendations may be updated based on patient status, additional functional criteria and insurance authorization.    Follow Up Recommendations  Skilled nursing-short term rehab (<3 hours/day)    Assistance Recommended at Discharge Frequent or constant Supervision/Assistance  Patient can return home with the following  Two people to help with walking and/or transfers;Two people to help with bathing/dressing/bathroom   Equipment Recommendations  Other (comment) (TBD)    Recommendations for Other Services      Precautions / Restrictions Precautions Precautions: Fall Precaution Comments: L chest tube Restrictions Weight Bearing Restrictions: No       Mobility Bed Mobility               General bed mobility comments: OOB in recliner upon entry    Transfers                         Balance Overall balance assessment: Needs  assistance Sitting-balance support: No upper extremity supported, Feet supported Sitting balance-Leahy Scale: Good                                     ADL either performed or assessed with clinical judgement   ADL Overall ADL's : Needs assistance/impaired     Grooming: Set up;Sitting               Lower Body Dressing: Maximal assistance;Sitting/lateral leans Lower Body Dressing Details (indicate cue type and reason): able to manage socks in figure 4 technique today, latera leans with assist at hips required with cueing for technique   Toilet Transfer Details (indicate cue type and reason): pt declined           General ADL Comments: pt up in recliner and declines movement out of recliner during OT session    Extremity/Trunk Assessment Upper Extremity Assessment Upper Extremity Assessment: Generalized weakness            Vision       Perception     Praxis      Cognition Arousal/Alertness: Awake/alert Behavior During Therapy: WFL for tasks assessed/performed Overall Cognitive Status: Impaired/Different from baseline Area of Impairment: Attention, Memory, Following commands, Problem solving, Awareness, Safety/judgement                   Current Attention Level: Sustained Memory: Decreased short-term memory Following Commands: Follows one step commands consistently, Follows one  step commands with increased time Safety/Judgement: Decreased awareness of safety, Decreased awareness of deficits Awareness: Emergent Problem Solving: Slow processing, Decreased initiation, Difficulty sequencing, Requires verbal cues General Comments: pt with poor awareness, decreased problem solving with cueing required for simple tasks        Exercises Exercises: Other exercises Other Exercises Other Exercises: chair pushups x 5 reps 2 sets, min assist and minimal clearance of hips    Shoulder Instructions       General Comments VSS on 2L     Pertinent Vitals/ Pain       Pain Assessment Pain Assessment: Faces Faces Pain Scale: Hurts little more Pain Location: back, L hip, L ribs Pain Descriptors / Indicators: Discomfort Pain Intervention(s): Limited activity within patient's tolerance, Monitored during session, Repositioned  Home Living                                          Prior Functioning/Environment              Frequency  Min 2X/week        Progress Toward Goals  OT Goals(current goals can now be found in the care plan section)  Progress towards OT goals: Progressing toward goals  Acute Rehab OT Goals Patient Stated Goal: get better OT Goal Formulation: With patient Time For Goal Achievement: 09/27/21 Potential to Achieve Goals: [place] Discharge plan remains appropriate;Frequency remains appropriate    Co-evaluation                 AM-PAC OT "6 Clicks" Daily Activity     Outcome Measure   Help from another person eating meals?: None Help from another person taking care of personal grooming?: A Little Help from another person toileting, which includes using toliet, bedpan, or urinal?: Total Help from another person bathing (including washing, rinsing, drying)?: A Lot Help from another person to put on and taking off regular upper body clothing?: A Little Help from another person to put on and taking off regular lower body clothing?: A Lot 6 Click Score: 15    End of Session Equipment Utilized During Treatment: Oxygen  OT Visit Diagnosis: Other abnormalities of gait and mobility (R26.89);Muscle weakness (generalized) (M62.81);Pain;Other symptoms and signs involving cognitive function Pain - Right/Left:  (back, L hip L ribs)   Activity Tolerance Patient tolerated treatment well   Patient Left in chair;with call bell/phone within reach   Nurse Communication Mobility status        Time: 4562-5638 OT Time Calculation (min): 22 min  Charges: OT General  Charges $OT Visit: 1 Visit OT Treatments $Self Care/Home Management : 8-22 mins  Jolaine Artist, OT [place] Pager [phone] Office [place] 09/15/2021, 9:35 AM

## 2021-09-15 NOTE — Consult Note (Signed)
° °  [facility] Inpatient Consult   09/15/2021  Lance House 01/17/1951 [id number]  [facility] [ACO] Patient: Lance House  Primary Care Provider:  [facility]  Patient screened for less than 30 days readmission hospitalization .  Review of patient's medical record reveals patient is being recommended for a skilled nursing facility level of care.   Plan:  Continue to follow progress and disposition to assess for post hospital care management needs.    For questions contact:   Natividad Brood, RN BSN [facility] Liaison  [phone] business mobile phone Toll free office [phone]  Fax number: [phone] [email or web address]

## 2021-09-15 NOTE — Progress Notes (Signed)
Physical Therapy Treatment Patient Details Name: Lance House MRN: [id number] DOB: 18-Nov-1950 Today's Date: 09/15/2021   History of Present Illness Pt is a 71 y/o male presenting 2/19 with SOB, weakness, L sided chest pain for several weeks. Found hypoxic, septic shock with L sided empyema.  Recent admission for traumatic ICH after fall and dc'd to SNF but left AMA. S/P VATS 2/20. PMH includes: ETOH, depression, compression fractures.    PT Comments    Patient declines oob activity, states he just got settled. He was up in recliner earlier today and now back in bed. He requires max encouragement to perform LE exercises in bed. Patient will continue to benefit from skilled PT while here to improve functional mobility, safety and independence.     Recommendations for follow up therapy are one component of a multi-disciplinary discharge planning process, led by the attending physician.  Recommendations may be updated based on patient status, additional functional criteria and insurance authorization.  Follow Up Recommendations  Skilled nursing-short term rehab (<3 hours/day)     Assistance Recommended at Discharge Frequent or constant Supervision/Assistance  Patient can return home with the following Two people to help with walking and/or transfers;Two people to help with bathing/dressing/bathroom;Assistance with cooking/housework;Assistance with feeding;Direct supervision/assist for medications management;Direct supervision/assist for financial management;Assist for transportation;Help with stairs or ramp for entrance   Equipment Recommendations  None recommended by PT;Other (comment) (TBD)    Recommendations for Other Services       Precautions / Restrictions Precautions Precautions: Fall Precaution Comments: L chest tube Restrictions Weight Bearing Restrictions: No     Mobility  Bed Mobility               General bed mobility comments: patient jest got settled, declines  getting oob again.    Transfers                        Ambulation/Gait                   Stairs             Wheelchair Mobility    Modified Rankin (Stroke Patients Only)       Balance                                            Cognition Arousal/Alertness: Awake/alert Behavior During Therapy: WFL for tasks assessed/performed Overall Cognitive Status: Impaired/Different from baseline Area of Impairment: Attention, Memory, Following commands, Problem solving, Awareness, Safety/judgement                   Current Attention Level: Sustained Memory: Decreased short-term memory Following Commands: Follows one step commands consistently, Follows one step commands with increased time Safety/Judgement: Decreased awareness of safety, Decreased awareness of deficits Awareness: Emergent Problem Solving: Slow processing, Decreased initiation, Difficulty sequencing, Requires verbal cues General Comments: pt with poor awareness, decreased problem solving with cueing required for simple tasks        Exercises Other Exercises Other Exercises: B LE strengthening exercises: ap, hip abad/add, heel slides, SLR x 10 reps. Requires max encouragement.    General Comments        Pertinent Vitals/Pain Pain Assessment Pain Assessment: No/denies pain    Home Living  Prior Function            PT Goals (current goals can now be found in the care plan section) Acute Rehab PT Goals Patient Stated Goal: be able to walk to bathroom PT Goal Formulation: With patient Time For Goal Achievement: 09/27/21 Potential to Achieve Goals: [place] Progress towards PT goals: Not progressing toward goals - comment (requires max encouragement to participate.)    Frequency    Min 2X/week      PT Plan Current plan remains appropriate    Co-evaluation              AM-PAC PT "6 Clicks" Mobility   Outcome  Measure  Help needed turning from your back to your side while in a flat bed without using bedrails?: A Little Help needed moving from lying on your back to sitting on the side of a flat bed without using bedrails?: A Little Help needed moving to and from a bed to a chair (including a wheelchair)?: A Lot Help needed standing up from a chair using your arms (e.g., wheelchair or bedside chair)?: A Lot Help needed to walk in hospital room?: Total Help needed climbing 3-5 steps with a railing? : Total 6 Click Score: 12    End of Session   Activity Tolerance: Patient tolerated treatment well Patient left: in bed;with bed alarm set;with call bell/phone within reach Nurse Communication: Mobility status PT Visit Diagnosis: Unsteadiness on feet (R26.81);Other abnormalities of gait and mobility (R26.89);Repeated falls (R29.6);Muscle weakness (generalized) (M62.81);History of falling (Z91.81);Difficulty in walking, not elsewhere classified (R26.2);Adult, failure to thrive (R62.7);Pain Pain - part of body: Hip     Time: 1340-1350 PT Time Calculation (min) (ACUTE ONLY): 10 min  Charges:  $Therapeutic Exercise: 8-22 mins                     Janel Beane, PT, GCS 09/15/21,1:58 PM

## 2021-09-15 NOTE — Progress Notes (Signed)
[facility] hospital[facility] progress note   History of Present Illness:  Lance House is a 71 y/o gentleman with a history of former tobacco abuse, ETOH abuse with recent admission for traumatic ICH after a fall who presented to the ED on 2/19 with complaints of intermittent SOB, weakness, left-sided chest pain that worsened over the past several weeks. Consistent with left sided empyema.   He decided to come to the ED from home because his pain never improved. When EMS arrived he was hypoxic to 86% on RA. He was recently admitted to the hospital from 1/24- 2/2 for an ICH and compression fractures suffered during a fall while intoxicated. He was discharged to [facility] and was brought back to the ED within about 2 hours, where he remained admitted in the ED from 2/2- 2/13 due to placement issues. He has chronic pain for which he takes oxycodone. He was transferred from [facility] ED to [facility] for TCTS evaluation with tentative plans for VATS (Video-assisted thoracoscopic surgery) with Dr. Roxan Hockey.   Pertinent  Medical History  ETOH abuse- quit 3 months ago Tobacco abuse- quit recently Gout Depression Compression fractures- thoracic and lumbar ICH- Feb 2023  Significant Hospital Events: Including procedures, antibiotic start and stop dates in addition to other pertinent events   2/19 admitted 2/20 TCTS performed VATS   Interim History / Subjective:  POD2 VATS  No longer on pressors  Left chest tube in place  Pain overnight and PRN oxycodone increased to 7.5 mg q4h  Net I/O -285  Objective   Blood pressure 109/68, pulse 90, temperature 97.6 F (36.4 C), temperature source Oral, resp. rate 19, weight 80.6 kg, SpO2 93 %.        Intake/Output Summary (Last 24 hours) at 09/15/2021 1024 Last data filed at 09/15/2021 0854 Gross per 24 hour  Intake 749.63 ml  Output 3050 ml  Net -2300.37 ml   Filed Weights   09/12/21 0500 09/13/21 0500 09/14/21 0342  Weight: 80.1 kg 80.7 kg 80.6 kg    Vitals last 24 hours:  T 97.5- 98.2 PR 70-99 RR 9-28 SBP 112-171 DBP 60-103 SpO2 89-100 on [place] 2 L   Examination: General: chronically ill appearing man lying in bed in NAD HENT: [place]/AT, eyes anicteric Lungs: chest tube draining on left side  Cardiovascular: S1S2, reg rhythm Abdomen: soft, NT Extremities: chronic stasis dermatitis, no LE edema, no cyanosis.  Derm: multiple scabs and minor wounds on his feet Neuro: awake, globally weak, moving all extremities, following commands  Labs:  Na 132 mildly hyponatremic  K 4.4 wnl  Creatinine low 0.57 compared to yesterday 0.67  Corrected Calcium wnl 9.8   Mg 1.9 Phosphorous 2.3 slightly decreased   WBC leukocytosis improved from 22.9 yesterday to 11.1 this am  H/H 12.9/38.4   Resolved Hospital Problem list   Lactic acidosis Hypocalcemia  Mild AKI  Assessment & Plan:   Septic shock due to left-sided pneumonia  Left sided Empyema s/p VATS  Chest tube with 600 cc last 24 hours. Pleural culture shows gram positive cocci no growth in less than 24 hours  Overall improved and will transfer out of ICU today.  - broad antibiotics cefepime, metronidazole, vanc and follow up on intrapleural cultures de-escalate to 09/14/2021 to ceftriaxone patient on culture data -Oxycodone PRN for pain; has long history of opiate use -Acetaminophen 1000 mg q6h daily  - Gabapentin 300 mg BID  - Lidocaine patch daily  - Continue chest tube  - Appreciate TCTS  follow up  Acute hypoxic respiratory failure  Patient extubated. Not on home air.  - [place] for target SpO2 > 88 %  - mucinex - bronchodilators PRN; not on BD as OP. Needs OP PFTs  Hypertension  BP ranges over the last 24 hours SBP 112-171 DBP 60-103. Not on home BP medications.  - Continue to monitor at this time may be due to uncontrolled pain.   Acute Urinary retention Improved on bethanechol  - Continue Bethanechol  - Monitor I/O  Hyponatremia  Na 132. Likely due to per oral  intake.  - Monitor   H/o ETOH abuse No signs of withdrawal -vitamins  Hypophosphotemia  Phosphorous 2.3. Likely due to poor oral intake and anti-acid inhibition of phosphate  - Continue to monitor  - Discontinue Protonix 40 mg IV  - Replete phosphate   Mild protein energy malnutrition - PT/OT consult  -vitamins -RD consult  Unstageable decubitus ulcer - Wound care  Gout  Home medication allopurinol 300 mg. Patient states he has not been taking and cannot remember the last time he had a gout flare.    Labs   CBC: Recent Labs  Lab 09/11/21 1644 09/12/21 0239 09/13/21 0545 09/14/21 0156 09/15/21 0525  WBC 24.3* 22.9* 11.1* 10.1 11.4*  NEUTROABS 21.7* 21.2* 9.1* 7.9* 8.8*  HGB 13.7 13.1 12.9* 12.4* 14.9  HCT 42.5 39.2 38.4* 36.1* 44.6  MCV 96.6 95.6 95.0 92.8 92.7  PLT 348 353 317 315 119    Basic Metabolic Panel: Recent Labs  Lab 09/11/21 1644 09/12/21 0239 09/13/21 0545 09/14/21 0156 09/14/21 1115 09/15/21 0525  NA 136 133* 132* 127* 128* 134*  K 4.7 4.8 4.4 3.8 3.8 4.0  CL 104 105 102 98 94* 99  CO2 24 20* 25 22 29 27   GLUCOSE 114* 127* 85 111* 122* 80  BUN 12 15 15 8  <5* <5*  CREATININE 0.67 0.64 0.57* 0.55* 0.53* 0.40*  CALCIUM 8.1* 7.7* 8.0* 7.4* 7.2* 7.8*  MG 2.3 2.1 1.9 1.6*  --  1.8  PHOS 4.6 3.6 2.3* 2.4*  --  2.9   GFR: Estimated Creatinine Clearance: 99.9 mL/min (A) (by C-G formula based on SCr of 0.4 mg/dL (L)). Recent Labs  Lab 09/10/21 1104 09/10/21 1324 09/11/21 0159 09/12/21 0239 09/13/21 0545 09/14/21 0156 09/15/21 0525  PROCALCITON 0.82  --   --   --   --   --   --   WBC 30.8*  --    < > 22.9* 11.1* 10.1 11.4*  LATICACIDVEN 2.1* 1.4  --   --   --   --   --    < > = values in this interval not displayed.    Liver Function Tests: Recent Labs  Lab 09/10/21 1104 09/11/21 1644 09/12/21 0239 09/13/21 0545  AST 25 19 19 18   ALT 20 16 15 17   ALKPHOS 84 77 68 67  BILITOT 1.1 0.5 0.8 0.2*  PROT 6.0* 4.7* 4.4* 4.8*   ALBUMIN 2.5* 1.6* 1.6* 1.8*   No results for input(s): LIPASE, AMYLASE in the last 168 hours. Recent Labs  Lab 09/10/21 1104  AMMONIA 13    ABG    Component Value Date/Time   PHART 7.243 (L) 09/11/2021 1636   PCO2ART 56.3 (H) 09/11/2021 1636   PO2ART 83 09/11/2021 1636   HCO3 24.5 09/11/2021 1636   TCO2 26 09/11/2021 1636   ACIDBASEDEF 4.0 (H) 09/11/2021 1636   O2SAT 94 09/11/2021 1636     Coagulation Profile: Recent Labs  Lab 09/10/21 2346  INR 1.4*    Cardiac Enzymes: Recent Labs  Lab 09/10/21 1104  CKTOTAL 135    HbA1C: No results found for: HGBA1C  CBG: Recent Labs  Lab 09/12/21 1105 09/12/21 1534 09/12/21 1923 09/12/21 2331 09/13/21 0312  GLUCAP 104* 144* 116* 98 111*

## 2021-09-16 ENCOUNTER — Inpatient Hospital Stay (HOSPITAL_COMMUNITY): Payer: Medicare HMO

## 2021-09-16 DIAGNOSIS — J869 Pyothorax without fistula: Secondary | ICD-10-CM | POA: Diagnosis not present

## 2021-09-16 LAB — CBC WITH DIFFERENTIAL/PLATELET
Abs Immature Granulocytes: 0.4 10*3/uL — ABNORMAL HIGH (ref 0.00–0.07)
Basophils Absolute: 0.1 10*3/uL (ref 0.0–0.1)
Basophils Relative: 1 %
Eosinophils Absolute: 0.4 10*3/uL (ref 0.0–0.5)
Eosinophils Relative: 4 %
HCT: 35.7 % — ABNORMAL LOW (ref 39.0–52.0)
Hemoglobin: 11.7 g/dL — ABNORMAL LOW (ref 13.0–17.0)
Immature Granulocytes: 4 %
Lymphocytes Relative: 19 %
Lymphs Abs: 1.8 10*3/uL (ref 0.7–4.0)
MCH: 30.7 pg (ref 26.0–34.0)
MCHC: 32.8 g/dL (ref 30.0–36.0)
MCV: 93.7 fL (ref 80.0–100.0)
Monocytes Absolute: 0.6 10*3/uL (ref 0.1–1.0)
Monocytes Relative: 6 %
Neutro Abs: 6.4 10*3/uL (ref 1.7–7.7)
Neutrophils Relative %: 66 %
Platelets: 349 10*3/uL (ref 150–400)
RBC: 3.81 MIL/uL — ABNORMAL LOW (ref 4.22–5.81)
RDW: 14 % (ref 11.5–15.5)
WBC: 9.6 10*3/uL (ref 4.0–10.5)
nRBC: 0 % (ref 0.0–0.2)

## 2021-09-16 LAB — AEROBIC/ANAEROBIC CULTURE W GRAM STAIN (SURGICAL/DEEP WOUND)

## 2021-09-16 LAB — PHOSPHORUS: Phosphorus: 2.9 mg/dL (ref 2.5–4.6)

## 2021-09-16 LAB — MAGNESIUM: Magnesium: 1.5 mg/dL — ABNORMAL LOW (ref 1.7–2.4)

## 2021-09-16 IMAGING — DX DG CHEST 1V PORT
1 series · 1 of 1 positions shown · non-contrast
Comparison: Yesterday

CLINICAL DATA: Pneumothorax follow-up

EXAM:
PORTABLE CHEST 1 VIEW

[chest ap]
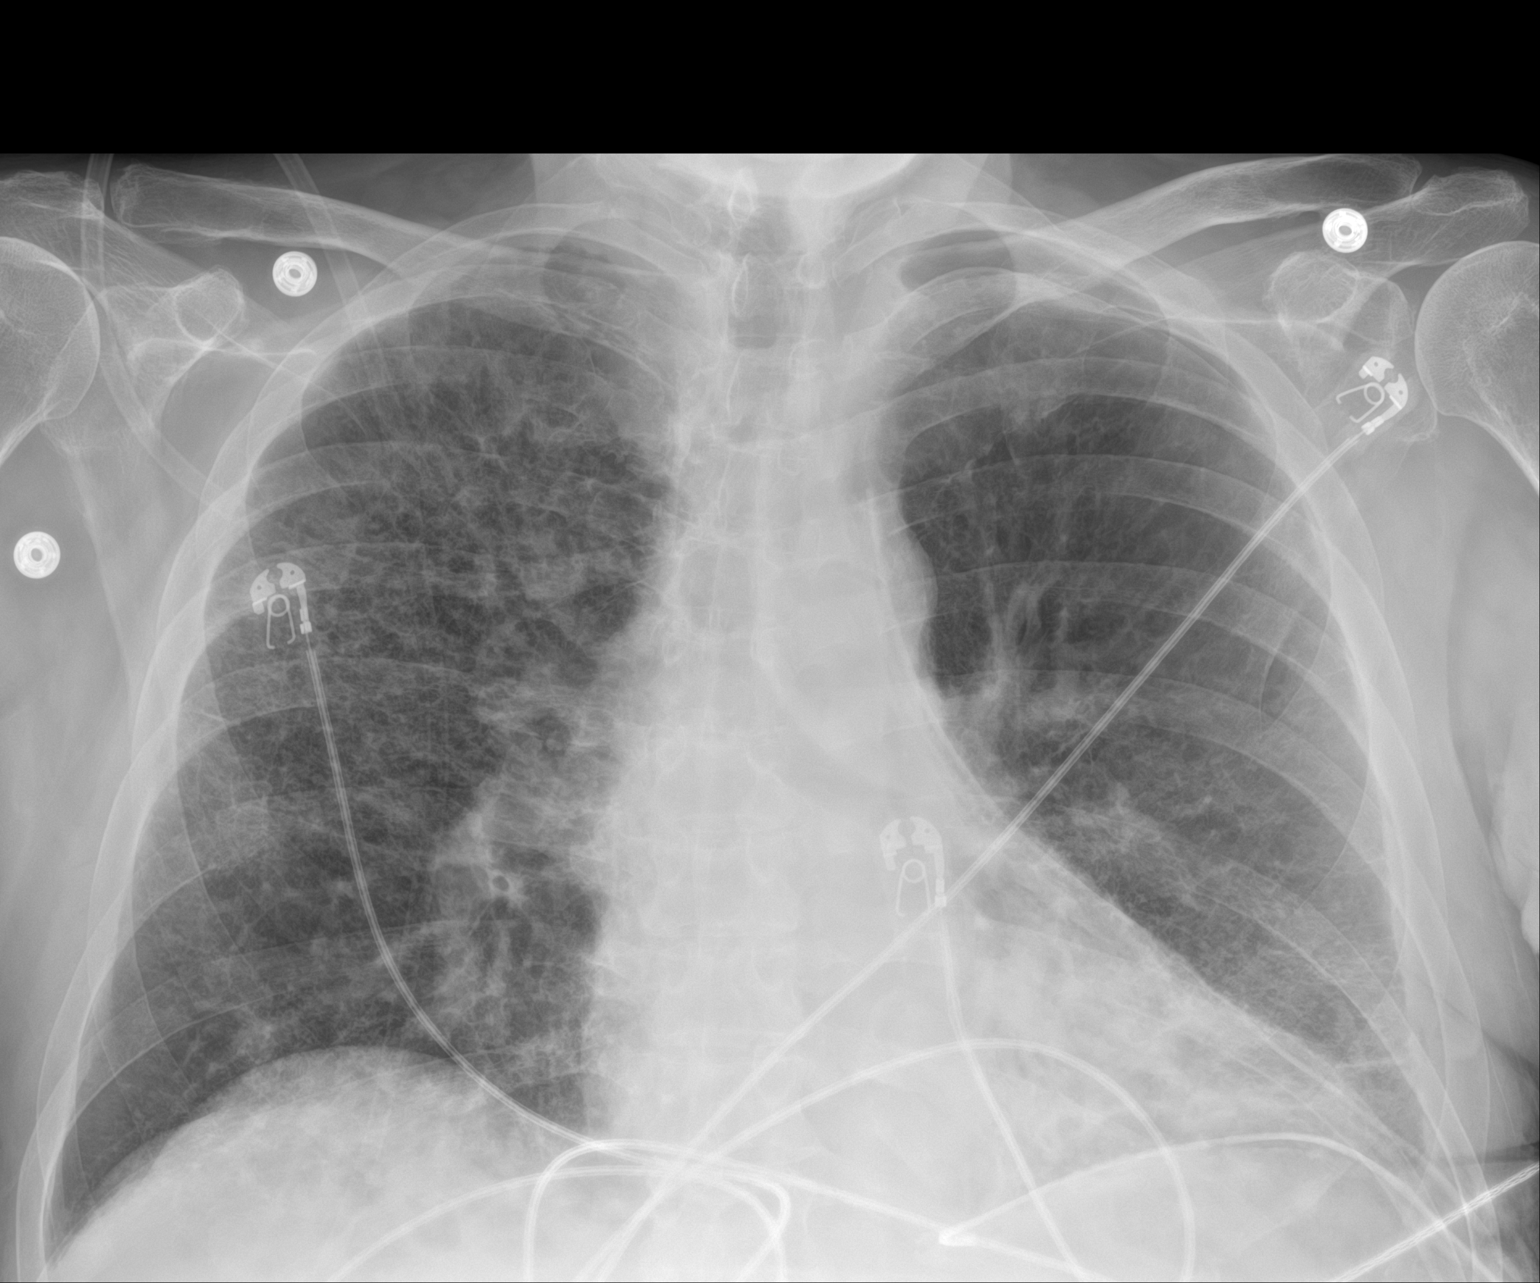

[1 of 1 positions shown; findings below may reference images not displayed]

FINDINGS: Small left apical pneumothorax is unchanged. Unchanged residual
parenchymal opacity scattered in the bilateral lungs, densest at the
left base. Unchanged pleural fluid or thickening laterally at the
left chest. Normal heart size. Stable left chest tube positioning.
IMPRESSION: 1. Stable left chest tube and small apical pneumothorax.
2. Stable pulmonary infiltrates.

## 2021-09-16 MED ORDER — BOOST PO LIQD
237.0000 mL | Freq: Three times a day (TID) | ORAL | Status: DC
  Administered 2021-09-16 – 2021-09-26 (×20): 237 mL via ORAL
  Filled 2021-09-16 (×34): qty 237

## 2021-09-16 MED ORDER — ACETAMINOPHEN 500 MG PO TABS
1000.0000 mg | ORAL_TABLET | Freq: Once | ORAL | Status: AC
  Administered 2021-09-17: 1000 mg via ORAL
  Filled 2021-09-16: qty 2

## 2021-09-16 MED ORDER — MAGNESIUM SULFATE IN D5W 1-5 GM/100ML-% IV SOLN
1.0000 g | Freq: Once | INTRAVENOUS | Status: AC
  Administered 2021-09-16: 1 g via INTRAVENOUS
  Filled 2021-09-16: qty 100

## 2021-09-16 NOTE — Progress Notes (Signed)
[facility] hospital[facility] progress note   History of Present Illness:  Lance House is a 71 y/o gentleman with a history of former tobacco abuse, ETOH abuse with recent admission for traumatic ICH after a fall who presented to the ED on 2/19 with complaints of intermittent SOB, weakness, left-sided chest pain that worsened over the past several weeks. Consistent with left sided empyema.   He decided to come to the ED from home because his pain never improved. When EMS arrived he was hypoxic to 86% on RA. He was recently admitted to the hospital from 1/24- 2/2 for an ICH and compression fractures suffered during a fall while intoxicated. He was discharged to [facility] and was brought back to the ED within about 2 hours, where he remained admitted in the ED from 2/2- 2/13 due to placement issues. He has chronic pain for which he takes oxycodone. He was transferred from [facility] ED to [facility] for TCTS evaluation with tentative plans for VATS (Video-assisted thoracoscopic surgery) with Dr. Roxan Hockey.   Pertinent  Medical History  ETOH abuse- quit 3 months ago Tobacco abuse- quit recently Gout Depression Compression fractures- thoracic and lumbar ICH- Feb 2023  Significant Hospital Events: Including procedures, antibiotic start and stop dates in addition to other pertinent events   2/19 admitted 2/20 TCTS performed VATS   Interim History / Subjective:  POD2 VATS  No longer on pressors  Left chest tube in place  Pain overnight and PRN oxycodone increased to 7.5 mg q4h  Net I/O -285  Objective   Blood pressure 133/86, pulse 80, temperature 97.9 F (36.6 C), temperature source Oral, resp. rate 14, weight 80.6 kg, SpO2 96 %.        Intake/Output Summary (Last 24 hours) at 09/16/2021 1055 Last data filed at 09/16/2021 0600 Gross per 24 hour  Intake 240 ml  Output 1875 ml  Net -1635 ml   Filed Weights   09/12/21 0500 09/13/21 0500 09/14/21 0342  Weight: 80.1 kg 80.7 kg 80.6 kg    Vitals last 24 hours:  T 97.5- 98.2 PR 70-99 RR 9-28 SBP 112-171 DBP 60-103 SpO2 89-100 on [place] 2 L   Examination: General: chronically ill appearing man lying in bed in NAD HENT: [place]/AT, eyes anicteric Lungs: chest tube draining on left side  Cardiovascular: S1S2, reg rhythm Abdomen: soft, NT Extremities: chronic stasis dermatitis, no LE edema, no cyanosis.  Derm: multiple scabs and minor wounds on his feet Neuro: awake, globally weak, moving all extremities, following commands  Labs:  Na 132 mildly hyponatremic  K 4.4 wnl  Creatinine low 0.57 compared to yesterday 0.67  Corrected Calcium wnl 9.8   Mg 1.9 Phosphorous 2.3 slightly decreased   WBC leukocytosis improved from 22.9 yesterday to 11.1 this am  H/H 12.9/38.4   Resolved Hospital Problem list   Lactic acidosis Hypocalcemia  Mild AKI  Assessment & Plan:   Septic shock due to left-sided pneumonia  Left sided Empyema s/p VATS  Pleural culture shows gram positive cocci no growth in less than 24 hours - broad antibiotics cefepime, metronidazole, vanc and follow up on intrapleural cultures de-escalate to 09/14/2021 to ceftriaxone patient on culture data -Oxycodone PRN for pain; has long history of opiate use.  Pain management through CT surgery -Acetaminophen 1000 mg q6h daily  - Gabapentin 300 mg BID  - Lidocaine patch daily  -Chest tube management per TCTS  Acute hypoxic respiratory failure  Patient extubated. Not on home air.  - [place] for  target SpO2 > 88 %  - mucinex - bronchodilators PRN; not on BD as OP. Needs OP PFTs  Hypertension  BP ranges over the last 24 hours SBP 112-171 DBP 60-103. Not on home BP medications.  - Continue to monitor at this time may be due to uncontrolled pain.   Acute Urinary retention Improved on bethanechol  - Continue Bethanechol  - Monitor I/O  Hyponatremia  - Monitor   H/o ETOH abuse No signs of withdrawal -vitamins  Hypophosphotemia  Phosphorous 2.3. Likely due  to poor oral intake and anti-acid inhibition of phosphate  - Continue to monitor  - Discontinue Protonix 40 mg IV  - Replete phosphate   Mild protein energy malnutrition - PT/OT consult  -vitamins -RD consult  Unstageable decubitus ulcer - Wound care  Gout  -Home medication allopurinol 300 mg. Patient states he has not been taking and cannot remember the last time he had a gout flare.    Labs   CBC: Recent Labs  Lab 09/12/21 0239 09/13/21 0545 09/14/21 0156 09/15/21 0525 09/16/21 0122  WBC 22.9* 11.1* 10.1 11.4* 9.6  NEUTROABS 21.2* 9.1* 7.9* 8.8* 6.4  HGB 13.1 12.9* 12.4* 14.9 11.7*  HCT 39.2 38.4* 36.1* 44.6 35.7*  MCV 95.6 95.0 92.8 92.7 93.7  PLT 353 317 315 287 597    Basic Metabolic Panel: Recent Labs  Lab 09/12/21 0239 09/13/21 0545 09/14/21 0156 09/14/21 1115 09/15/21 0525 09/16/21 0122  NA 133* 132* 127* 128* 134*  --   K 4.8 4.4 3.8 3.8 4.0  --   CL 105 102 98 94* 99  --   CO2 20* 25 22 29 27   --   GLUCOSE 127* 85 111* 122* 80  --   BUN 15 15 8  <5* <5*  --   CREATININE 0.64 0.57* 0.55* 0.53* 0.40*  --   CALCIUM 7.7* 8.0* 7.4* 7.2* 7.8*  --   MG 2.1 1.9 1.6*  --  1.8 1.5*  PHOS 3.6 2.3* 2.4*  --  2.9 2.9   GFR: Estimated Creatinine Clearance: 99.9 mL/min (A) (by C-G formula based on SCr of 0.4 mg/dL (L)). Recent Labs  Lab 09/10/21 1104 09/10/21 1324 09/11/21 0159 09/13/21 0545 09/14/21 0156 09/15/21 0525 09/16/21 0122  PROCALCITON 0.82  --   --   --   --   --   --   WBC 30.8*  --    < > 11.1* 10.1 11.4* 9.6  LATICACIDVEN 2.1* 1.4  --   --   --   --   --    < > = values in this interval not displayed.    Liver Function Tests: Recent Labs  Lab 09/10/21 1104 09/11/21 1644 09/12/21 0239 09/13/21 0545  AST 25 19 19 18   ALT 20 16 15 17   ALKPHOS 84 77 68 67  BILITOT 1.1 0.5 0.8 0.2*  PROT 6.0* 4.7* 4.4* 4.8*  ALBUMIN 2.5* 1.6* 1.6* 1.8*   No results for input(s): LIPASE, AMYLASE in the last 168 hours. Recent Labs  Lab  09/10/21 1104  AMMONIA 13    ABG    Component Value Date/Time   PHART 7.243 (L) 09/11/2021 1636   PCO2ART 56.3 (H) 09/11/2021 1636   PO2ART 83 09/11/2021 1636   HCO3 24.5 09/11/2021 1636   TCO2 26 09/11/2021 1636   ACIDBASEDEF 4.0 (H) 09/11/2021 1636   O2SAT 94 09/11/2021 1636     Coagulation Profile: Recent Labs  Lab 09/10/21 2346  INR 1.4*    Cardiac  Enzymes: Recent Labs  Lab 09/10/21 1104  CKTOTAL 135    HbA1C: No results found for: HGBA1C  CBG: Recent Labs  Lab 09/12/21 1105 09/12/21 1534 09/12/21 1923 09/12/21 2331 09/13/21 0312  GLUCAP 104* 144* 116* 98 111*

## 2021-09-16 NOTE — Progress Notes (Signed)
Anterior chest tube removed by Gold, PA with CTS. Patient tolerated well.

## 2021-09-16 NOTE — Progress Notes (Signed)
5 Days Post-Op Procedure(s) (LRB): VIDEO ASSISTED THORACOSCOPY (VATS)/DECORTICATION (Left) Subjective: Still having some pain after left decortication Chest tube output 170 cc yesterday Chest x-ray today looks clear Air leak from chest tube Incision clean and dry We will leave chest tube in today and probably removed tomorrow  Objective: Vital signs in last 24 hours: Temp:  [97.6 F (36.4 C)-98.4 F (36.9 C)] 98.4 F (36.9 C) (02/25 1150) Pulse Rate:  [75-97] 97 (02/25 1150) Cardiac Rhythm: Normal sinus rhythm (02/25 0730) Resp:  [13-19] 14 (02/25 1150) BP: (119-133)/(75-86) 125/77 (02/25 1150) SpO2:  [93 %-96 %] 96 % (02/25 0400)  Hemodynamic parameters for last 24 hours:    Intake/Output from previous day: 02/24 0701 - 02/25 0700 In: 240 [P.O.:240] Out: 1945 [Urine:1775; Chest Tube:170] Intake/Output this shift: Total I/O In: 960 [P.O.:960] Out: 775 [Urine:775]  Lungs clear Chest tube secure Air leak through Pleur-evac  Lab Results: Recent Labs    09/15/21 0525 09/16/21 0122  WBC 11.4* 9.6  HGB 14.9 11.7*  HCT 44.6 35.7*  PLT 287 349   BMET:  Recent Labs    09/14/21 1115 09/15/21 0525  NA 128* 134*  K 3.8 4.0  CL 94* 99  CO2 29 27  GLUCOSE 122* 80  BUN <5* <5*  CREATININE 0.53* 0.40*  CALCIUM 7.2* 7.8*    PT/INR: No results for input(s): LABPROT, INR in the last 72 hours. ABG    Component Value Date/Time   PHART 7.243 (L) 09/11/2021 1636   HCO3 24.5 09/11/2021 1636   TCO2 26 09/11/2021 1636   ACIDBASEDEF 4.0 (H) 09/11/2021 1636   O2SAT 94 09/11/2021 1636   CBG (last 3)  No results for input(s): GLUCAP in the last 72 hours.  Assessment/Plan: S/P Procedure(s) (LRB): VIDEO ASSISTED THORACOSCOPY (VATS)/DECORTICATION (Left) Continue antibiotics and chest tube drainage today Plan to remove chest tube tomorrow   LOS: 6 days    Dahlia Byes 09/16/2021

## 2021-09-17 ENCOUNTER — Inpatient Hospital Stay (HOSPITAL_COMMUNITY): Payer: Medicare HMO

## 2021-09-17 DIAGNOSIS — J869 Pyothorax without fistula: Secondary | ICD-10-CM | POA: Diagnosis not present

## 2021-09-17 IMAGING — DX DG CHEST 1V PORT
1 series · 1 of 1 positions shown · non-contrast
Comparison: [DATE] and older exams.  CT, [DATE].

CLINICAL DATA: Follow-up, left lung empyema.

EXAM:
PORTABLE CHEST 1 VIEW

[chest ap]
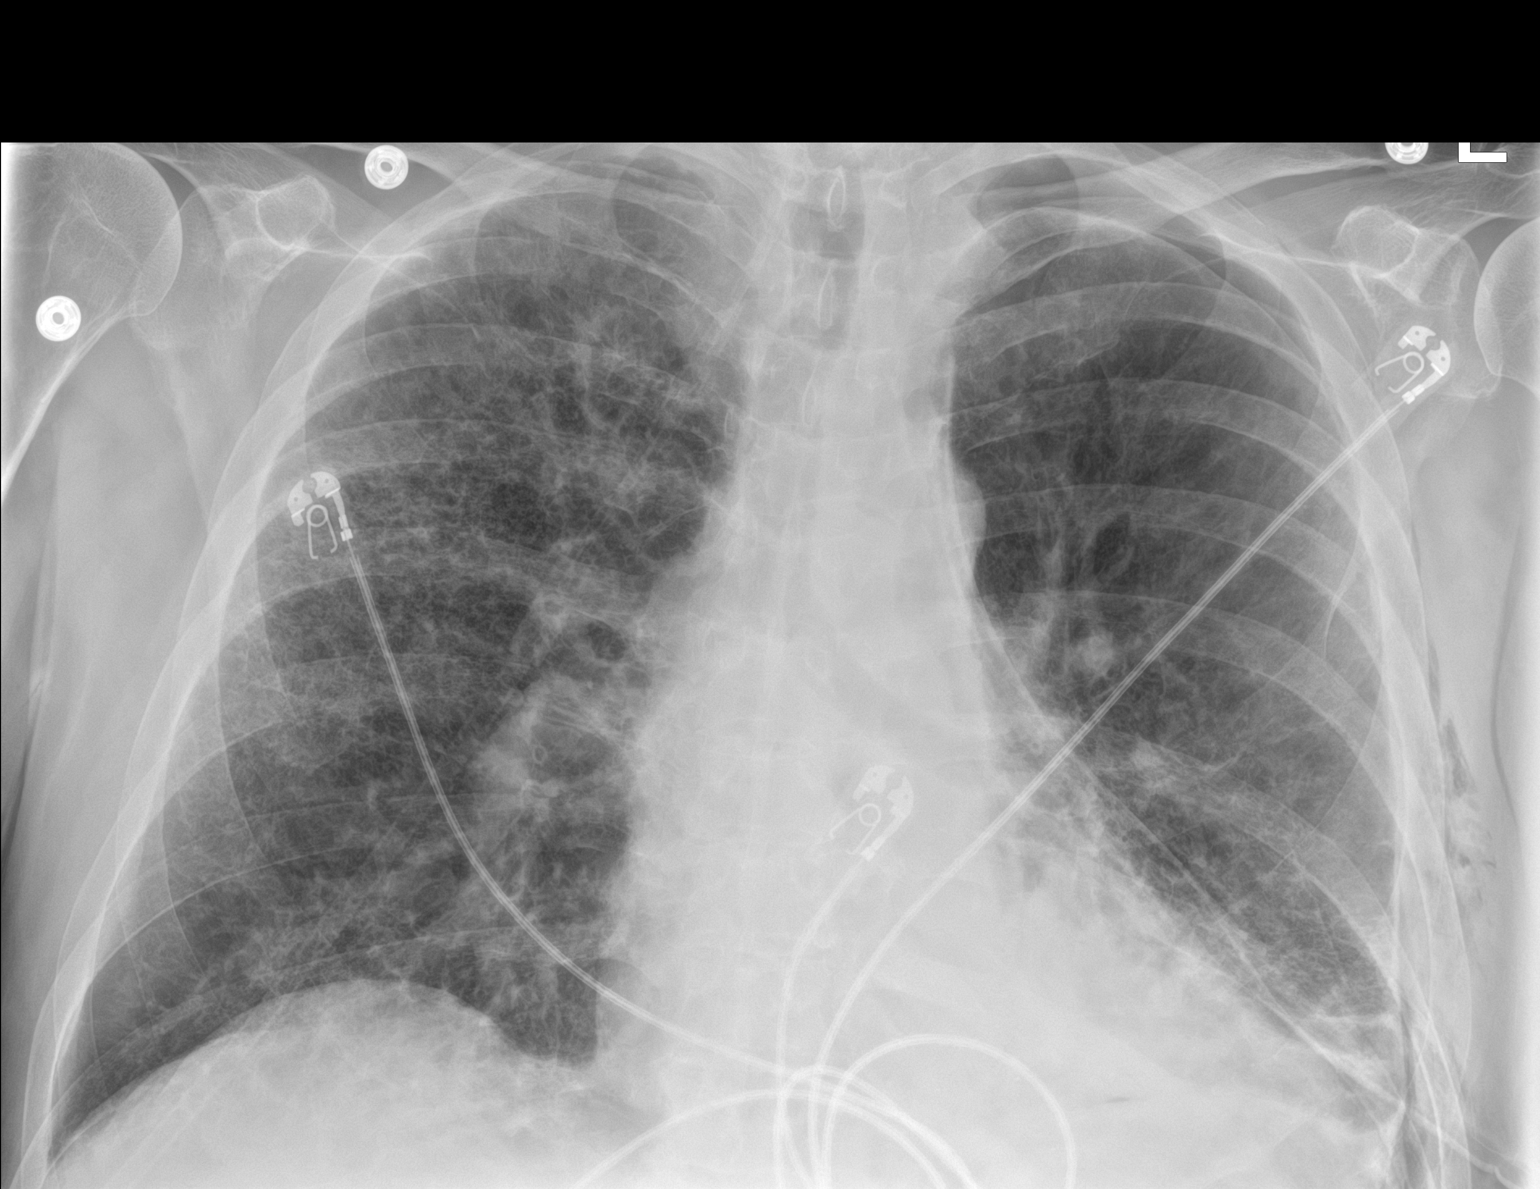

[1 of 1 positions shown; findings below may reference images not displayed]

FINDINGS: Small left pneumothorax, without significant change from the
previous day's study. Left lung base opacity is also stable
consistent with atelectasis and/or residual infection. Heterogeneous
bilateral areas of interstitial thickening are unchanged consistent
with fibrosis. No new lung abnormalities.

Left chest tube is stable, tip along the medial left upper
hemithorax.

Small amount of subcutaneous air now evident along the lateral left
chest wall, new since the previous day's study.
IMPRESSION: 1. Small amount of left lateral chest wall subcutaneous emphysema,
new since the prior study. No other change from the previous day's
study, allowing for differences in patient positioning and
radiographic technique.
2. Persistent small left pneumothorax. Stable left lung base
opacity.

## 2021-09-17 MED ORDER — GABAPENTIN 400 MG PO CAPS
400.0000 mg | ORAL_CAPSULE | Freq: Two times a day (BID) | ORAL | Status: DC
  Administered 2021-09-17 – 2021-09-18 (×3): 400 mg via ORAL
  Filled 2021-09-17 (×4): qty 1

## 2021-09-17 MED ORDER — GABAPENTIN 800 MG PO TABS
400.0000 mg | ORAL_TABLET | Freq: Two times a day (BID) | ORAL | Status: DC
  Filled 2021-09-17: qty 0.5

## 2021-09-17 NOTE — Progress Notes (Addendum)
[address]       [place] [address]             [phone]      5 Days Post-Op Procedure(s) (LRB): VIDEO ASSISTED THORACOSCOPY (VATS)/DECORTICATION (Left) Subjective: Some coughing episodes, productive, generally feeling better  Objective: Vital signs in last 24 hours: Temp:  [97.6 F (36.4 C)-98.2 F (36.8 C)] 97.9 F (36.6 C) (02/25 0742) Pulse Rate:  [75-93] 80 (02/25 0742) Cardiac Rhythm: Normal sinus rhythm (02/24 2015) Resp:  [13-19] 14 (02/25 0742) BP: (119-144)/(75-88) 133/86 (02/25 0742) SpO2:  [93 %-96 %] 96 % (02/25 0400)  Hemodynamic parameters for last 24 hours:    Intake/Output from previous day: 02/24 0701 - 02/25 0700 In: 240 [P.O.:240] Out: 1945 [Urine:1775; Chest Tube:170] Intake/Output this shift: No intake/output data recorded.  General appearance: alert, cooperative, and no distress Heart: regular rate and rhythm Lungs: dim left>right lower fields Wound: incis healing well  Lab Results: Recent Labs    09/15/21 0525 09/16/21 0122  WBC 11.4* 9.6  HGB 14.9 11.7*  HCT 44.6 35.7*  PLT 287 349   BMET:  Recent Labs    09/14/21 1115 09/15/21 0525  NA 128* 134*  K 3.8 4.0  CL 94* 99  CO2 29 27  GLUCOSE 122* 80  BUN <5* <5*  CREATININE 0.53* 0.40*  CALCIUM 7.2* 7.8*    PT/INR: No results for input(s): LABPROT, INR in the last 72 hours. ABG    Component Value Date/Time   PHART 7.243 (L) 09/11/2021 1636   HCO3 24.5 09/11/2021 1636   TCO2 26 09/11/2021 1636   ACIDBASEDEF 4.0 (H) 09/11/2021 1636   O2SAT 94 09/11/2021 1636   CBG (last 3)  No results for input(s): GLUCAP in the last 72 hours.  Meds Scheduled Meds:  acetaminophen  1,000 mg Oral Q6H   bethanechol  5 mg Oral TID   bisacodyl  10 mg Oral Daily   Chlorhexidine Gluconate Cloth  6 each Topical Daily   docusate sodium  100 mg Oral BID   folic acid  1 mg Oral Daily   gabapentin  300 mg Oral BID   heparin  5,000 Units Subcutaneous Q8H    lidocaine  1 patch Transdermal Q24H   mouth rinse  15 mL Mouth Rinse BID   multivitamin with minerals  1 tablet Oral Daily   senna-docusate  1 tablet Oral QHS   thiamine  100 mg Oral Daily   Continuous Infusions:  sodium chloride     sodium chloride 50 mL/hr at 09/14/21 1800   cefTRIAXone (ROCEPHIN)  IV Stopped (09/15/21 1215)   magnesium sulfate bolus IVPB     PRN Meds:.albuterol, clonazePAM, guaiFENesin-dextromethorphan, ipratropium-albuterol, ondansetron (ZOFRAN) IV, oxyCODONE  Xrays DG CHEST PORT 1 VIEW  Result Date: 09/16/2021 CLINICAL DATA:  Pneumothorax follow-up EXAM: PORTABLE CHEST 1 VIEW COMPARISON:  Yesterday FINDINGS: Small left apical pneumothorax is unchanged. Unchanged residual parenchymal opacity scattered in the bilateral lungs, densest at the left base. Unchanged pleural fluid or thickening laterally at the left chest. Normal heart size. Stable left chest tube positioning. IMPRESSION: 1. Stable left chest tube and small apical pneumothorax. 2. Stable pulmonary infiltrates. Electronically Signed   By: Jorje Guild M.D.   On: 09/16/2021 08:50   DG CHEST PORT 1 VIEW  Result Date: 09/15/2021 CLINICAL DATA:  Pneumothorax, chest tube. EXAM: PORTABLE CHEST 1 VIEW COMPARISON:  Radiograph September 14, 2021 FINDINGS: Unchanged position of the 2 left-sided thoracostomy tubes. Persistent  small left apical pneumothorax. The heart size and mediastinal contours are unchanged. Similar diffuse bilateral interstitial and alveolar opacities. No visible pleural effusion. The visualized skeletal structures are unchanged. IMPRESSION: 1. Persistent small left apical pneumothorax. 2. Similar diffuse bilateral interstitial and alveolar opacities. Electronically Signed   By: Dahlia Bailiff M.D.   On: 09/15/2021 07:54    Assessment/Plan: S/P Procedure(s) (LRB): VIDEO ASSISTED THORACOSCOPY (VATS)/DECORTICATION (Left) POD#5  1 afeb, VSS 2 sats good on 2 liters 3 CT 170 cc 4 no leukocyrosis 5  stable appearance of CXR 6 will remove ant CT- not done yesterday, patient tolerated well 7 medical management per primary  Late entry- this is yesterdays note    LOS: 6 days    John Giovanni PA-C Pager [phone] 09/16/2021

## 2021-09-17 NOTE — Progress Notes (Signed)
[facility] hospital[facility] progress note   History of Present Illness:  [name] is a 71 y/o gentleman with a history of former tobacco abuse, ETOH abuse with recent admission for traumatic ICH after a fall who presented to the ED on 2/19 with complaints of intermittent SOB, weakness, left-sided chest pain that worsened over the past several weeks. Consistent with left sided empyema.   He decided to come to the ED from home because his pain never improved. When EMS arrived he was hypoxic to 86% on RA. He was recently admitted to the hospital from 1/24- 2/2 for an ICH and compression fractures suffered during a fall while intoxicated. He was discharged to [facility] and was brought back to the ED within about 2 hours, where he remained admitted in the ED from 2/2- 2/13 due to placement issues. He has chronic pain for which he takes oxycodone. He was transferred from [facility] ED to [facility] for TCTS evaluation with tentative plans for VATS (Video-assisted thoracoscopic surgery) with Dr. Roxan Hockey.   Pertinent  Medical History  ETOH abuse- quit 3 months ago Tobacco abuse- quit recently Gout Depression Compression fractures- thoracic and lumbar ICH- Feb 2023  Significant Hospital Events: Including procedures, antibiotic start and stop dates in addition to other pertinent events   2/19 admitted 2/20 TCTS performed VATS   Interim History / Subjective:  Status post VATS  No longer on pressors  Left chest tube in place  Pain overnight and PRN oxycodone increased to 7.5 mg q4h managed by CT surgery Net I/O -285  Objective   Blood pressure (!) 147/92, pulse 100, temperature 98.5 F (36.9 C), temperature source Oral, resp. rate 15, weight 85.2 kg, SpO2 97 %.        Intake/Output Summary (Last 24 hours) at 09/17/2021 1030 Last data filed at 09/17/2021 0925 Gross per 24 hour  Intake 1440 ml  Output 2750 ml  Net -1310 ml   Filed Weights   09/13/21 0500 09/14/21 0342 09/17/21 0335  Weight:  80.7 kg 80.6 kg 85.2 kg   Vitals last 24 hours:  T 97.5- 98.2 PR 70-99 RR 9-28 SBP 112-171 DBP 60-103 SpO2 89-100 on [place] 2 L   Examination: General: chronically ill appearing man lying in bed in NAD HENT: [place]/AT, eyes anicteric Lungs: chest tube draining on left side  Cardiovascular: S1S2, reg rhythm Abdomen: soft, NT Extremities: chronic stasis dermatitis, no LE edema, no cyanosis.  Derm: multiple scabs and minor wounds on his feet Neuro: awake, globally weak, moving all extremities, following commands up in chair  Labs:  Na 132 mildly hyponatremic  K 4.4 wnl  Creatinine low 0.57 compared to yesterday 0.67  Corrected Calcium wnl 9.8   Mg 1.9 Phosphorous 2.3 slightly decreased   WBC leukocytosis improved from 22.9 yesterday to 11.1 this am  H/H 12.9/38.4   Resolved Hospital Problem list   Lactic acidosis Hypocalcemia  Mild AKI  Assessment & Plan:   Septic shock due to left-sided pneumonia  Left sided Empyema s/p VATS  Pleural culture shows gram positive cocci no growth in less than 24 hours - broad antibiotics cefepime, metronidazole, vanc and follow up on intrapleural cultures de-escalate to 09/14/2021 to ceftriaxone patient on culture data -Oxycodone PRN for pain; has long history of opiate use.  Pain management through CT surgery -Acetaminophen 1000 mg q6h daily  - Gabapentin 300 mg BID  - Lidocaine patch daily  -Chest tube management per TCTS  Acute hypoxic respiratory failure  Patient extubated. Not  on home air.  - [place] for target SpO2 > 88 %  - mucinex - bronchodilators PRN; not on BD as OP. Needs OP PFTs  Hypertension  BP ranges over the last 24 hours SBP 112-171 DBP 60-103. Not on home BP medications.  - Continue to monitor at this time may be due to uncontrolled pain.   Acute Urinary retention Improved on bethanechol  - Continue Bethanechol  - Monitor I/O  Hyponatremia  - Monitor   H/o ETOH abuse No signs of  withdrawal -vitamins  Hypophosphotemia  Phosphorous 2.3. Likely due to poor oral intake and anti-acid inhibition of phosphate  - Continue to monitor  - Discontinue Protonix 40 mg IV  - Replete phosphate   Mild protein energy malnutrition - PT/OT consult  -vitamins -RD consult  Unstageable decubitus ulcer - Wound care  Gout  -Home medication allopurinol 300 mg. Patient states he has not been taking and cannot remember the last time he had a gout flare.   Disposition plan once chest tubes are out and respiratory status remains stable after tubes out   Labs   CBC: Recent Labs  Lab 09/12/21 0239 09/13/21 0545 09/14/21 0156 09/15/21 0525 09/16/21 0122  WBC 22.9* 11.1* 10.1 11.4* 9.6  NEUTROABS 21.2* 9.1* 7.9* 8.8* 6.4  HGB 13.1 12.9* 12.4* 14.9 11.7*  HCT 39.2 38.4* 36.1* 44.6 35.7*  MCV 95.6 95.0 92.8 92.7 93.7  PLT 353 317 315 287 096    Basic Metabolic Panel: Recent Labs  Lab 09/12/21 0239 09/13/21 0545 09/14/21 0156 09/14/21 1115 09/15/21 0525 09/16/21 0122  NA 133* 132* 127* 128* 134*  --   K 4.8 4.4 3.8 3.8 4.0  --   CL 105 102 98 94* 99  --   CO2 20* 25 22 29 27   --   GLUCOSE 127* 85 111* 122* 80  --   BUN 15 15 8  <5* <5*  --   CREATININE 0.64 0.57* 0.55* 0.53* 0.40*  --   CALCIUM 7.7* 8.0* 7.4* 7.2* 7.8*  --   MG 2.1 1.9 1.6*  --  1.8 1.5*  PHOS 3.6 2.3* 2.4*  --  2.9 2.9   GFR: Estimated Creatinine Clearance: 99.9 mL/min (A) (by C-G formula based on SCr of 0.4 mg/dL (L)). Recent Labs  Lab 09/10/21 1104 09/10/21 1324 09/11/21 0159 09/13/21 0545 09/14/21 0156 09/15/21 0525 09/16/21 0122  PROCALCITON 0.82  --   --   --   --   --   --   WBC 30.8*  --    < > 11.1* 10.1 11.4* 9.6  LATICACIDVEN 2.1* 1.4  --   --   --   --   --    < > = values in this interval not displayed.    Liver Function Tests: Recent Labs  Lab 09/10/21 1104 09/11/21 1644 09/12/21 0239 09/13/21 0545  AST 25 19 19 18   ALT 20 16 15 17   ALKPHOS 84 77 68 67  BILITOT  1.1 0.5 0.8 0.2*  PROT 6.0* 4.7* 4.4* 4.8*  ALBUMIN 2.5* 1.6* 1.6* 1.8*   No results for input(s): LIPASE, AMYLASE in the last 168 hours. Recent Labs  Lab 09/10/21 1104  AMMONIA 13    ABG    Component Value Date/Time   PHART 7.243 (L) 09/11/2021 1636   PCO2ART 56.3 (H) 09/11/2021 1636   PO2ART 83 09/11/2021 1636   HCO3 24.5 09/11/2021 1636   TCO2 26 09/11/2021 1636   ACIDBASEDEF 4.0 (H) 09/11/2021 1636  O2SAT 94 09/11/2021 1636     Coagulation Profile: Recent Labs  Lab 09/10/21 2346  INR 1.4*    Cardiac Enzymes: Recent Labs  Lab 09/10/21 1104  CKTOTAL 135    HbA1C: No results found for: HGBA1C  CBG: Recent Labs  Lab 09/12/21 1105 09/12/21 1534 09/12/21 1923 09/12/21 2331 09/13/21 0312  GLUCAP 104* 144* 116* 98 111*

## 2021-09-17 NOTE — Progress Notes (Signed)
Pt c/o of feeling saturated. Bed linens were wet with some fluids from chest tube bandages. Changed bandages, cleaned up patient and changed bed linens. No s/s of infection or complications with chest tube at this time. Will continue to monitor for leaking Louanne Skye 09/17/21 6:12 AM

## 2021-09-17 NOTE — Progress Notes (Addendum)
[address]       [place] [address]             [phone]      6 Days Post-Op Procedure(s) (LRB): VIDEO ASSISTED THORACOSCOPY (VATS)/DECORTICATION (Left) Subjective: Some SOB  Objective: Vital signs in last 24 hours: Temp:  [97.9 F (36.6 C)-98.5 F (36.9 C)] 98.5 F (36.9 C) (02/26 0835) Pulse Rate:  [95-104] 100 (02/26 0835) Cardiac Rhythm: Normal sinus rhythm (02/26 0803) Resp:  [14-20] 15 (02/26 0835) BP: (106-147)/(73-92) 147/92 (02/26 0835) SpO2:  [95 %-97 %] 97 % (02/26 0835) Weight:  [85.2 kg] 85.2 kg (02/26 0335)  Hemodynamic parameters for last 24 hours:    Intake/Output from previous day: 02/25 0701 - 02/26 0700 In: 1320 [P.O.:1320] Out: 2450 [Urine:2400; Chest Tube:50] Intake/Output this shift: Total I/O In: 480 [P.O.:480] Out: 300 [Urine:300]  General appearance: alert, cooperative, and no distress Heart: regular rate and rhythm Lungs: coarse scattered ronchi Wound: incis healing well  Lab Results: Recent Labs    09/15/21 0525 09/16/21 0122  WBC 11.4* 9.6  HGB 14.9 11.7*  HCT 44.6 35.7*  PLT 287 349   BMET:  Recent Labs    09/14/21 1115 09/15/21 0525  NA 128* 134*  K 3.8 4.0  CL 94* 99  CO2 29 27  GLUCOSE 122* 80  BUN <5* <5*  CREATININE 0.53* 0.40*  CALCIUM 7.2* 7.8*    PT/INR: No results for input(s): LABPROT, INR in the last 72 hours. ABG    Component Value Date/Time   PHART 7.243 (L) 09/11/2021 1636   HCO3 24.5 09/11/2021 1636   TCO2 26 09/11/2021 1636   ACIDBASEDEF 4.0 (H) 09/11/2021 1636   O2SAT 94 09/11/2021 1636   CBG (last 3)  No results for input(s): GLUCAP in the last 72 hours.  Meds Scheduled Meds:  acetaminophen  1,000 mg Oral Once   bethanechol  5 mg Oral TID   bisacodyl  10 mg Oral Daily   Chlorhexidine Gluconate Cloth  6 each Topical Daily   docusate sodium  100 mg Oral BID   folic acid  1 mg Oral Daily   gabapentin  300 mg Oral BID   heparin  5,000 Units Subcutaneous Q8H    lactose free nutrition  237 mL Oral TID WC   lidocaine  1 patch Transdermal Q24H   mouth rinse  15 mL Mouth Rinse BID   multivitamin with minerals  1 tablet Oral Daily   senna-docusate  1 tablet Oral QHS   thiamine  100 mg Oral Daily   Continuous Infusions:  sodium chloride     sodium chloride 50 mL/hr at 09/14/21 1800   cefTRIAXone (ROCEPHIN)  IV 2 g (09/16/21 1127)   PRN Meds:.albuterol, clonazePAM, guaiFENesin-dextromethorphan, ipratropium-albuterol, ondansetron (ZOFRAN) IV, oxyCODONE  Xrays DG Chest Port 1 View  Result Date: 09/17/2021 CLINICAL DATA:  Follow-up, left lung empyema. EXAM: PORTABLE CHEST 1 VIEW COMPARISON:  09/16/2021 and older exams.  CT, 09/10/2021. FINDINGS: Small left pneumothorax, without significant change from the previous day's study. Left lung base opacity is also stable consistent with atelectasis and/or residual infection. Heterogeneous bilateral areas of interstitial thickening are unchanged consistent with fibrosis. No new lung abnormalities. Left chest tube is stable, tip along the medial left upper hemithorax. Small amount of subcutaneous air now evident along the lateral left chest wall, new since the previous day's study. IMPRESSION: 1. Small amount of left lateral chest wall subcutaneous emphysema, new since the prior study.  No other change from the previous day's study, allowing for differences in patient positioning and radiographic technique. 2. Persistent small left pneumothorax. Stable left lung base opacity. Electronically Signed   By: Lajean Manes M.D.   On: 09/17/2021 08:21   DG CHEST PORT 1 VIEW  Result Date: 09/16/2021 CLINICAL DATA:  Pneumothorax follow-up EXAM: PORTABLE CHEST 1 VIEW COMPARISON:  Yesterday FINDINGS: Small left apical pneumothorax is unchanged. Unchanged residual parenchymal opacity scattered in the bilateral lungs, densest at the left base. Unchanged pleural fluid or thickening laterally at the left chest. Normal heart size. Stable  left chest tube positioning. IMPRESSION: 1. Stable left chest tube and small apical pneumothorax. 2. Stable pulmonary infiltrates. Electronically Signed   By: Jorje Guild M.D.   On: 09/16/2021 08:50    Results for orders placed or performed during the hospital encounter of 09/10/21  MRSA Next Gen by PCR, Nasal     Status: None   Collection Time: 09/11/21 10:03 AM   Specimen: Nasal Mucosa; Nasal Swab  Result Value Ref Range Status   MRSA by PCR Next Gen NOT DETECTED NOT DETECTED Final    Comment: (NOTE) The GeneXpert MRSA Assay (FDA approved for NASAL specimens only), is one component of a comprehensive MRSA colonization surveillance program. It is not intended to diagnose MRSA infection nor to guide or monitor treatment for MRSA infections. Test performance is not FDA approved in patients less than 49 years old. Performed at [facility], [place] [address], [place], [place] [address]   Aerobic/Anaerobic Culture w Gram Stain (surgical/deep wound)     Status: None   Collection Time: 09/11/21  1:42 PM   Specimen: PATH Cytology Pleural fluid; Body Fluid  Result Value Ref Range Status   Specimen Description PLEURAL FLUID  Final   Special Requests LEFT PLEURAL FLUID SPEC A  Final   Gram Stain   Final    FEW WBC PRESENT, PREDOMINANTLY PMN NO ORGANISMS SEEN    Culture   Final    FEW STREPTOCOCCUS INTERMEDIUS SUSCEPTIBILITIES PERFORMED ON PREVIOUS CULTURE WITHIN THE LAST 5 DAYS. NO ANAEROBES ISOLATED Performed at [facility], [place] [address] [place], [place] [address]    Report Status 09/16/2021 FINAL  Final  Fungus Culture With Stain     Status: None (Preliminary result)   Collection Time: 09/11/21  1:47 PM   Specimen: PATH Soft tissue resection  Result Value Ref Range Status   Fungus Stain Final report  Final    Comment: (NOTE) Performed At: [facility] [place], [place] [id number] Rush Farmer MD ON:[id number]    Fungus (Mycology) Culture  PENDING  Incomplete   Fungal Source TISSUE  Final    Comment: LEFT PLEURAL PEEL Performed at [facility], [place] [address], [place], [place] [address]   Aerobic/Anaerobic Culture w Gram Stain (surgical/deep wound)     Status: None (Preliminary result)   Collection Time: 09/11/21  1:47 PM   Specimen: PATH Soft tissue resection  Result Value Ref Range Status   Specimen Description TISSUE LEFT PLEURAL PEEL  Final   Special Requests PT ON VANC SAMPLE B  Final   Gram Stain   Final    MODERATE WBC PRESENT,BOTH PMN AND MONONUCLEAR RARE GRAM POSITIVE COCCI IN PAIRS    Culture   Final    FEW STREPTOCOCCUS INTERMEDIUS Sent to [place] for further susceptibility testing. NO ANAEROBES ISOLATED Performed at [facility], [place] [address], [place],  [address]  Report Status PENDING  Incomplete  Acid Fast Smear (AFB)     Status: None   Collection Time: 09/11/21  1:47 PM   Specimen: PATH Soft tissue resection  Result Value Ref Range Status   AFB Specimen Processing Comment  Final    Comment: Tissue Grinding and Digestion/Decontamination   Acid Fast Smear Negative  Final    Comment: (NOTE) Performed At: [facility] [place], [place] [id number] Rush Farmer MD RC:[id number]    Source (AFB) TISSUE  Final    Comment: LEFT PLEURAL PEEL Performed at [facility], [place] [address] [place], [place] [address]   Fungus Culture Result     Status: None   Collection Time: 09/11/21  1:47 PM  Result Value Ref Range Status   Result 1 Comment  Final    Comment: (NOTE) KOH/Calcofluor preparation:  no fungus observed. Performed At: [facility] [place], [place] [id number] Rush Farmer MD [id number]   Gram stain     Status: None   Collection Time: 09/11/21  2:02 PM   Specimen: PATH Soft tissue resection  Result Value Ref Range Status   Specimen Description TISSUE LEFT PLEURAL  Final   Special Requests PLEURAL PEEL NO 2 PT  ON VANC  Final   Gram Stain   Final    ABUNDANT WBC PRESENT,BOTH PMN AND MONONUCLEAR FEW GRAM POSITIVE COCCI Performed at [facility], [place] [address] [place], [place] [address]    Report Status 09/11/2021 FINAL  Final     Assessment/Plan: S/P Procedure(s) (LRB): VIDEO ASSISTED THORACOSCOPY (VATS)/DECORTICATION (Left) POD#6  1 afeb , VSS 2 sats ok on 2 liters 3 CT 50 cc 4 CXR minor sub q air, small left apical pntx is stable- will remove CT and get repeat film in am 5 medical management per primary   LOS: 7 days    John Giovanni PA-C Pager [phone] 09/17/2021     Chest tube out Lungs with equal BS  Gabapentin dose increased for neuropathic pain  patient examined and medical record reviewed,agree with above note. Dahlia Byes 09/17/2021

## 2021-09-18 ENCOUNTER — Inpatient Hospital Stay (HOSPITAL_COMMUNITY): Payer: Medicare HMO

## 2021-09-18 DIAGNOSIS — J869 Pyothorax without fistula: Secondary | ICD-10-CM | POA: Diagnosis not present

## 2021-09-18 LAB — CBC
HCT: 35.6 % — ABNORMAL LOW (ref 39.0–52.0)
Hemoglobin: 12 g/dL — ABNORMAL LOW (ref 13.0–17.0)
MCH: 31.4 pg (ref 26.0–34.0)
MCHC: 33.7 g/dL (ref 30.0–36.0)
MCV: 93.2 fL (ref 80.0–100.0)
Platelets: 434 10*3/uL — ABNORMAL HIGH (ref 150–400)
RBC: 3.82 MIL/uL — ABNORMAL LOW (ref 4.22–5.81)
RDW: 14.4 % (ref 11.5–15.5)
WBC: 8.8 10*3/uL (ref 4.0–10.5)
nRBC: 0 % (ref 0.0–0.2)

## 2021-09-18 LAB — C-REACTIVE PROTEIN: CRP: 0.7 mg/dL (ref <1.0)

## 2021-09-18 LAB — COMPREHENSIVE METABOLIC PANEL
ALT: 14 U/L (ref 0–44)
AST: 21 U/L (ref 15–41)
Albumin: 1.6 g/dL — ABNORMAL LOW (ref 3.5–5.0)
Alkaline Phosphatase: 63 U/L (ref 38–126)
Anion gap: 6 (ref 5–15)
BUN: <5 mg/dL — ABNORMAL LOW (ref 8–23)
CO2: 31 mmol/L (ref 22–32)
Calcium: 8 mg/dL — ABNORMAL LOW (ref 8.9–10.3)
Chloride: 98 mmol/L (ref 98–111)
Creatinine, Ser: 0.49 mg/dL — ABNORMAL LOW (ref 0.61–1.24)
GFR, Estimated: >60 mL/min (ref >60)
Glucose, Bld: 132 mg/dL — ABNORMAL HIGH (ref 70–99)
Potassium: 4 mmol/L (ref 3.5–5.1)
Sodium: 135 mmol/L (ref 135–145)
Total Bilirubin: 0.3 mg/dL (ref 0.3–1.2)
Total Protein: 4.9 g/dL — ABNORMAL LOW (ref 6.5–8.1)

## 2021-09-18 LAB — PROCALCITONIN: Procalcitonin: <0.1 ng/mL

## 2021-09-18 LAB — GLUCOSE, CAPILLARY: Glucose-Capillary: 130 mg/dL — ABNORMAL HIGH (ref 70–99)

## 2021-09-18 LAB — BRAIN NATRIURETIC PEPTIDE: B Natriuretic Peptide: 76.8 pg/mL (ref 0.0–100.0)

## 2021-09-18 LAB — MAGNESIUM: Magnesium: 1.7 mg/dL (ref 1.7–2.4)

## 2021-09-18 IMAGING — DX DG CHEST 2V
2 series · 2 of 2 positions shown · non-contrast
Comparison: Chest radiograph from one day prior.

CLINICAL DATA: Cough, recent chest tube removal, empyema

EXAM:
CHEST - 2 VIEW

[chest pa]
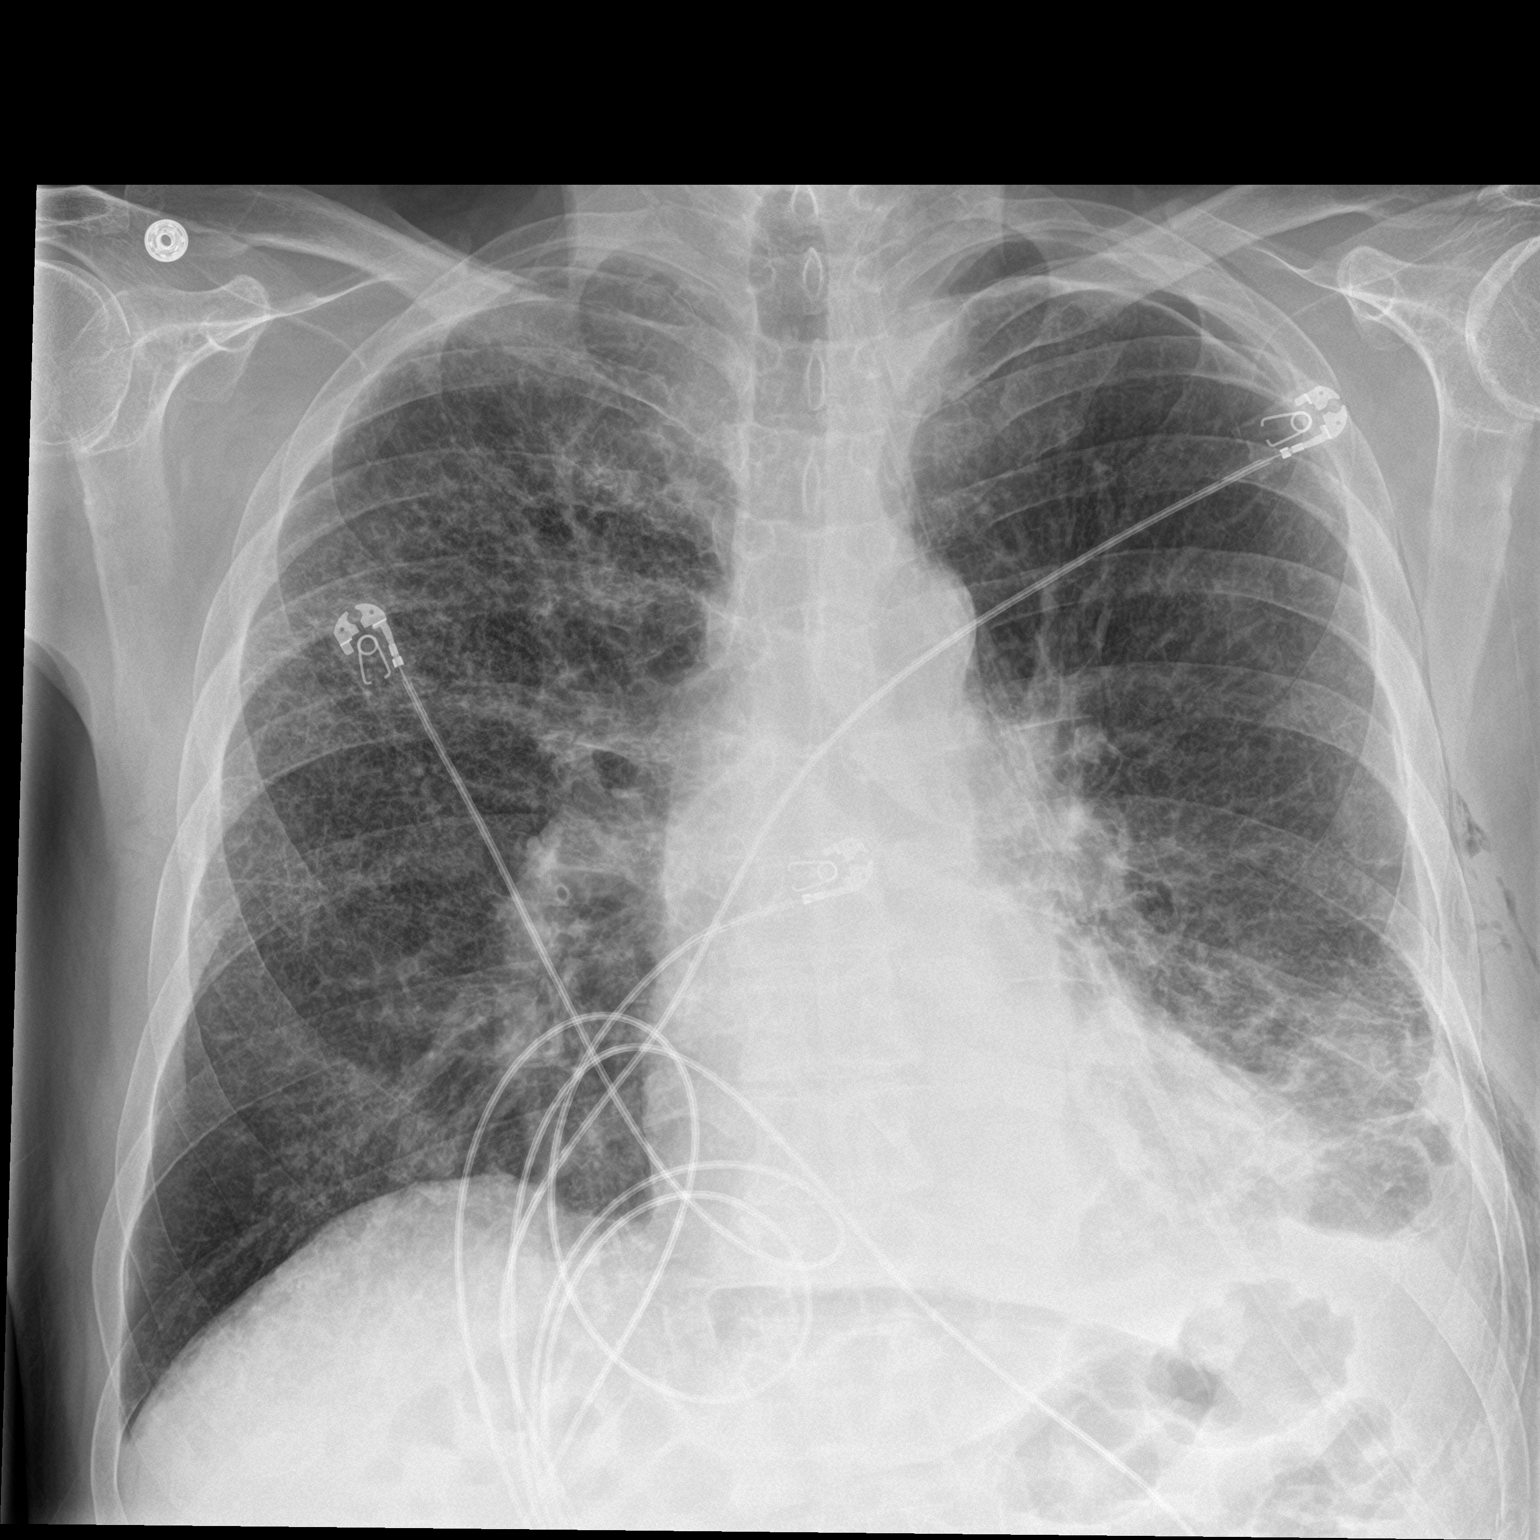

[chest lat]
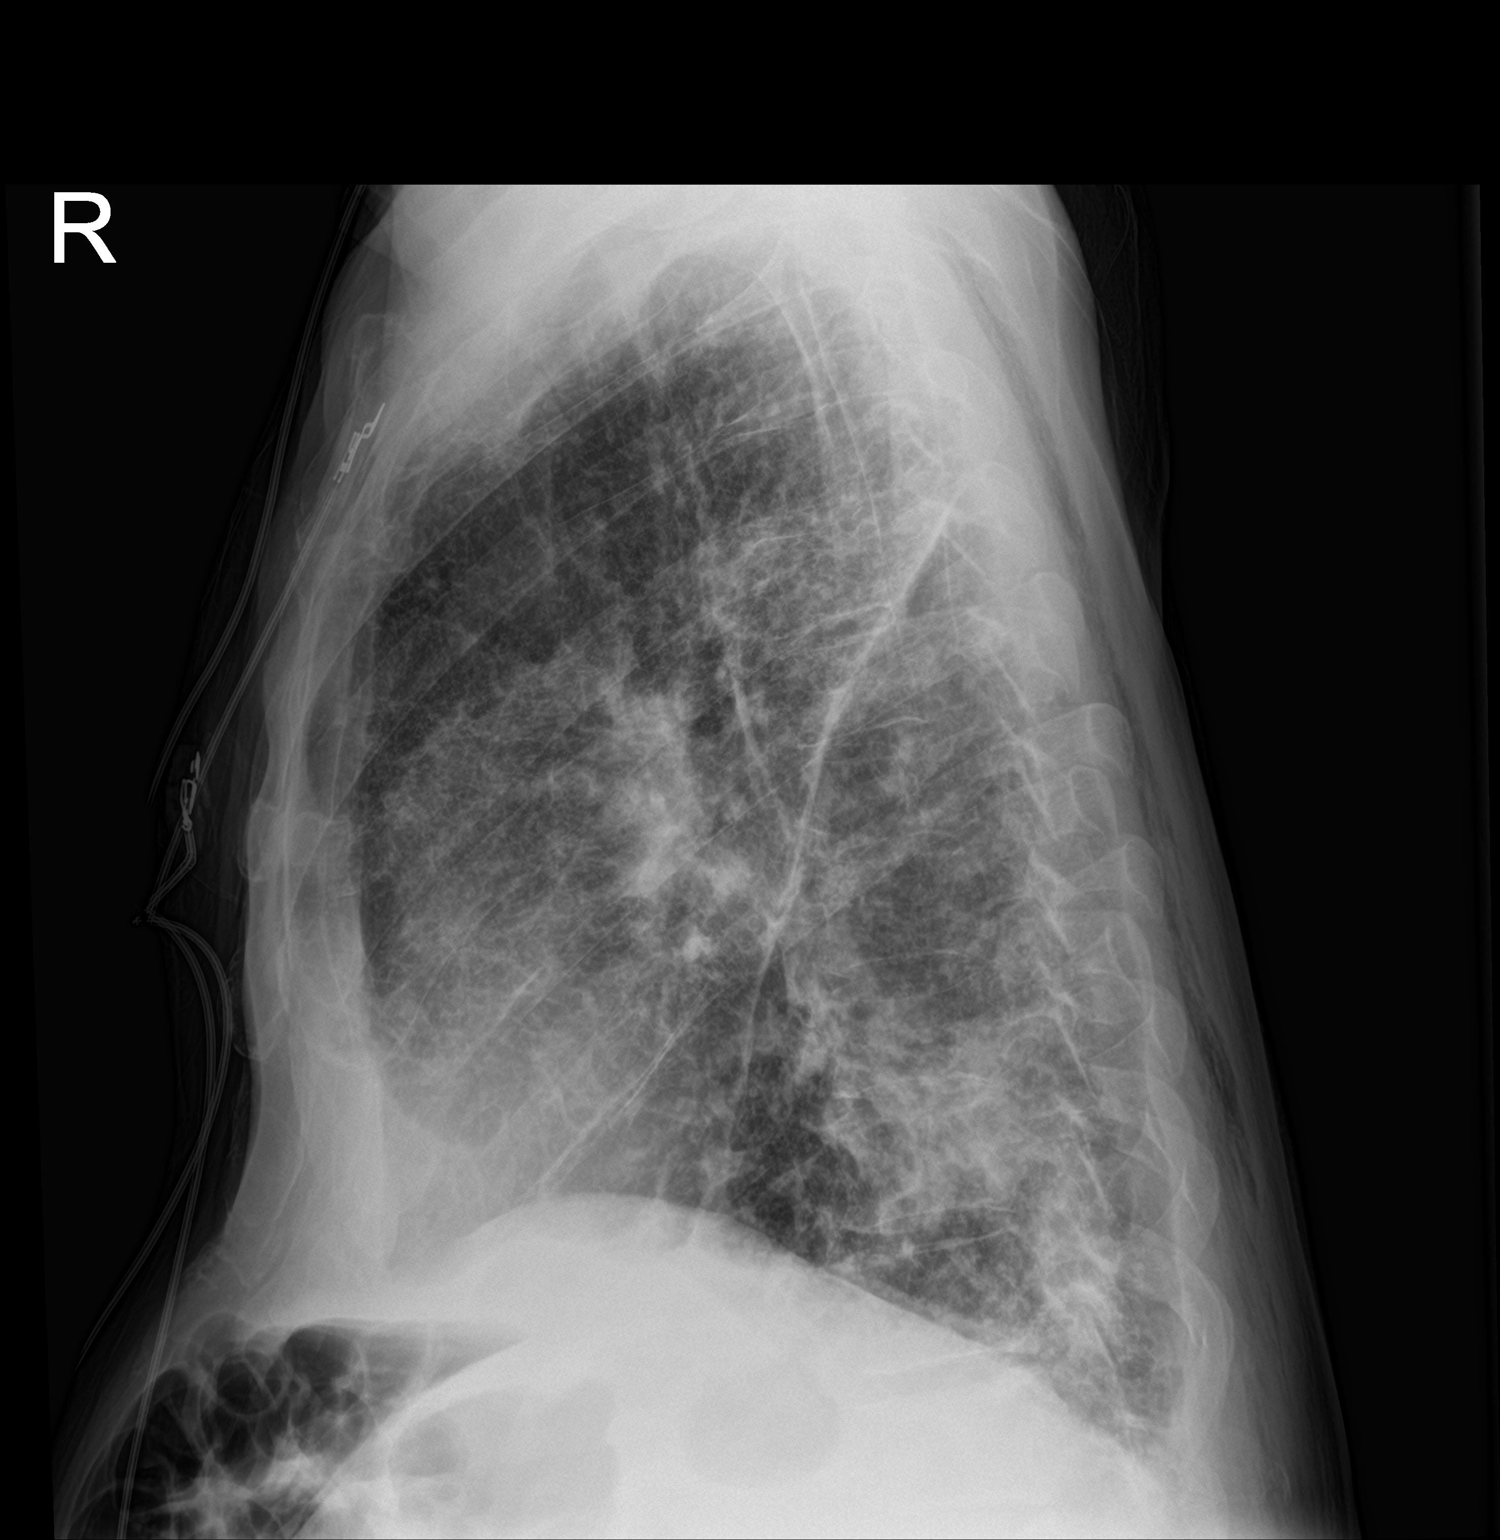

[2 of 2 positions shown; findings below may reference images not displayed]

FINDINGS: Interval removal of left chest tube. Mild subcutaneous emphysema in
the lateral left chest wall is similar. Stable cardiomediastinal
silhouette with normal heart size. No right pneumothorax. Small left
hydropneumothorax, overall stable size, with stable 5% small left
apical pneumothorax component and with filling of the previously
visualized small left lateral basilar pneumothorax component with a
small amount of left pleural fluid. No right pleural effusion.
Patchy left lung base opacity is similar. Emphysema and chronic hazy
reticular opacity throughout the right lung are similar.
IMPRESSION: 1. Small left hydropneumothorax, overall stable size, with stable 5%
left apical pneumothorax component and filling of the previously
visualized small left lateral basilar pneumothorax component with a
small amount of left pleural fluid. Interval removal of left chest
tube.
2. Stable patchy left lung base opacity, favor atelectasis.
3. Chronic hazy reticular opacity throughout the right lung.
Emphysema.

## 2021-09-18 MED ORDER — ALLOPURINOL 300 MG PO TABS
300.0000 mg | ORAL_TABLET | Freq: Every day | ORAL | Status: DC
  Filled 2021-09-18 (×3): qty 1

## 2021-09-18 MED ORDER — FUROSEMIDE 10 MG/ML IJ SOLN
20.0000 mg | Freq: Once | INTRAMUSCULAR | Status: AC
Start: 2021-09-18 — End: 2021-09-18
  Administered 2021-09-18: 20 mg via INTRAVENOUS
  Filled 2021-09-18: qty 2

## 2021-09-18 MED ORDER — TRAMADOL HCL 50 MG PO TABS
50.0000 mg | ORAL_TABLET | ORAL | Status: DC | PRN
  Administered 2021-09-19: 50 mg via ORAL
  Filled 2021-09-18: qty 1

## 2021-09-18 MED ORDER — PROSOURCE PLUS PO LIQD
30.0000 mL | Freq: Two times a day (BID) | ORAL | Status: DC
  Administered 2021-09-18 – 2021-09-25 (×13): 30 mL via ORAL
  Filled 2021-09-18 (×13): qty 30

## 2021-09-18 MED ORDER — ALUM & MAG HYDROXIDE-SIMETH 200-200-20 MG/5ML PO SUSP
30.0000 mL | Freq: Once | ORAL | Status: DC
  Filled 2021-09-18: qty 30

## 2021-09-18 MED ORDER — OXYCODONE HCL 5 MG PO TABS
5.0000 mg | ORAL_TABLET | Freq: Three times a day (TID) | ORAL | Status: DC | PRN
  Administered 2021-09-18 – 2021-09-19 (×3): 5 mg via ORAL
  Filled 2021-09-18 (×3): qty 1

## 2021-09-18 MED ORDER — TRAMADOL HCL 50 MG PO TABS
50.0000 mg | ORAL_TABLET | Freq: Four times a day (QID) | ORAL | Status: DC | PRN
  Administered 2021-09-18: 50 mg via ORAL
  Filled 2021-09-18: qty 1

## 2021-09-18 NOTE — NC FL2 (Signed)
[place] LEVEL OF CARE SCREENING TOOL     IDENTIFICATION  Patient Name: Lance House Birthdate: 1950/12/11 Sex: male Admission Date (Current Location): 09/10/2021  [facility] and [place] Number:  Engineering geologist and Address:  The . [facility], [place] [address] [place], [place] [address]      Provider Number: [id number]  Attending Physician Name and Address:  Thurnell Lose, MD  Relative Name and Phone Number:  Asbury Hair [phone]    Current Level of Care: Hospital Recommended Level of Care: [place] Prior Approval Number:    Date Approved/Denied:   PASRR Number: [id number] E exp: 09/20/21  Discharge Plan: SNF    Current Diagnoses: Patient Active Problem List   Diagnosis Date Noted   Pressure injury of skin 09/11/2021   Empyema ([place]) 09/10/2021   Compression fracture of fourth lumbar vertebra (HCC)    Elevated troponin    Pain    Alcoholic intoxication without complication ([place])    Fall    Compression fracture of thoracic vertebra ([place]) 08/18/2021   Compression fracture of L4 lumbar vertebra, closed, initial encounter ([place]) 08/18/2021   Hyponatremia 08/16/2021   Hypokalemia 08/16/2021   Rhabdomyolysis 08/16/2021   ICH (intracerebral hemorrhage) ([place]) 08/15/2021   S/P inguinal hernia repair 04/13/2019   Incarcerated left inguinal hernia    Abdominal pain 04/08/2019   Hernia 08/13/2013   Tobacco use disorder 08/13/2013   Chronic sinusitis 02/26/2013   Dyspnea 02/26/2013   Anxiety 02/03/2013   Left flank pain 02/03/2013    Orientation RESPIRATION BLADDER Height & Weight     Self, Time, Situation, Place  O2 (2L nasal cannula) Incontinent Weight: 188 lb 7.9 oz (85.5 kg) Height:     BEHAVIORAL SYMPTOMS/MOOD NEUROLOGICAL BOWEL NUTRITION STATUS      Incontinent Diet (see dc summary)  AMBULATORY STATUS COMMUNICATION OF NEEDS Skin   Limited Assist Verbally PU Stage and Appropriate Care (Stage II on coccyx  w/foam dressing)                       Personal Care Assistance Level of Assistance    Bathing Assistance: Limited assistance Feeding assistance: Independent Dressing Assistance: Maximum assistance     Functional Limitations Info  Hearing Sight Info: Impaired Hearing Info: Impaired Speech Info: Adequate    SPECIAL CARE FACTORS FREQUENCY  PT (By licensed PT), OT (By licensed OT)     PT Frequency: 5x/week OT Frequency: 5x/week            Contractures Contractures Info: Not present    Additional Factors Info  Code Status, Allergies Code Status Info: Full Allergies Info: Ciprocin-fluocin-procin (Fluocinolone), Ciprofloxacin, Colchicine, Duloxetine           Current Medications (09/18/2021):  This is the current hospital active medication list Current Facility-Administered Medications  Medication Dose Route Frequency Provider Last Rate Last Admin   (feeding supplement) PROSource Plus liquid 30 mL  30 mL Oral BID BM Lala Lund K, MD   30 mL at 09/18/21 1015   0.9 %  sodium chloride infusion   Intravenous Continuous Anders Simmonds, MD 50 mL/hr at 09/14/21 1800 Infusion Verify at 09/14/21 1800   albuterol (PROVENTIL) (2.5 MG/3ML) 0.083% nebulizer solution 2.5 mg  2.5 mg Nebulization Q2H PRN Lars Pinks M, PA-C       allopurinol (ZYLOPRIM) tablet 300 mg  300 mg Oral Daily Thurnell Lose, MD       alum & mag hydroxide-simeth (MAALOX/MYLANTA)  200-200-20 MG/5ML suspension 30 mL  30 mL Oral Once Shalhoub, Sherryll Burger, MD       bethanechol (URECHOLINE) tablet 5 mg  5 mg Oral TID Jacky Kindle, MD   5 mg at 09/16/21 1449   bisacodyl (DULCOLAX) EC tablet 10 mg  10 mg Oral Daily Lars Pinks M, PA-C   10 mg at 09/14/21 1027   cefTRIAXone (ROCEPHIN) 2 g in sodium chloride 0.9 % 100 mL IVPB  2 g Intravenous Q24H Derrill Kay A, MD 200 mL/hr at 09/18/21 1129 2 g at 09/18/21 1129   Chlorhexidine Gluconate Cloth 2 % PADS 6 each  6 each Topical Daily Nani Skillern, PA-C   6 each at 09/18/21 1014   clonazePAM (KLONOPIN) tablet 0.5 mg  0.5 mg Oral BID PRN Jose Persia, MD   0.5 mg at 09/18/21 0036   docusate sodium (COLACE) capsule 100 mg  100 mg Oral BID Jacky Kindle, MD   100 mg at 24/09/73 5329   folic acid (FOLVITE) tablet 1 mg  1 mg Oral Daily Jose Persia, MD   1 mg at 09/18/21 1013   gabapentin (NEURONTIN) capsule 400 mg  400 mg Oral BID Phillips Grout, MD   400 mg at 09/18/21 1014   guaiFENesin-dextromethorphan (ROBITUSSIN DM) 100-10 MG/5ML syrup 10 mL  10 mL Oral Q4H PRN Anders Simmonds, MD   10 mL at 09/17/21 2036   heparin injection 5,000 Units  5,000 Units Subcutaneous Q8H Lars Pinks M, PA-C   5,000 Units at 09/18/21 9242   ipratropium-albuterol (DUONEB) 0.5-2.5 (3) MG/3ML nebulizer solution 3 mL  3 mL Nebulization Q4H PRN Lars Pinks M, PA-C       lactose free nutrition (Boost) liquid 237 mL  237 mL Oral TID WC Dahlia Byes, MD   237 mL at 09/18/21 1014   lidocaine (LIDODERM) 5 % 1 patch  1 patch Transdermal Q24H Jacky Kindle, MD   1 patch at 09/16/21 1123   MEDLINE mouth rinse  15 mL Mouth Rinse BID Jacky Kindle, MD   15 mL at 09/17/21 6834   multivitamin with minerals tablet 1 tablet  1 tablet Oral Daily Jose Persia, MD   1 tablet at 09/18/21 1013   ondansetron (ZOFRAN) injection 4 mg  4 mg Intravenous Q6H PRN Lars Pinks M, PA-C   4 mg at 09/18/21 0241   oxyCODONE (Oxy IR/ROXICODONE) immediate release tablet 5 mg  5 mg Oral Q8H PRN Thurnell Lose, MD   5 mg at 09/18/21 1135   senna-docusate (Senokot-S) tablet 1 tablet  1 tablet Oral QHS Jose Persia, MD   1 tablet at 09/14/21 2023   thiamine tablet 100 mg  100 mg Oral Daily Jose Persia, MD   100 mg at 09/18/21 1013   traMADol (ULTRAM) tablet 50 mg  50 mg Oral Q6H PRN Thurnell Lose, MD         Discharge Medications: Please see discharge summary for a list of discharge medications.  Relevant Imaging Results:  Relevant Lab  Results:   Additional Information SS: [id number]. No covid vaccines in system  [employer], LCSW

## 2021-09-18 NOTE — Progress Notes (Signed)
Occupational Therapy Treatment Patient Details Name: Lance House MRN: [id number] DOB: 06-19-1951 Today's Date: 09/18/2021   History of present illness Pt is a 71 y/o male presenting 2/19 with SOB, weakness, L sided chest pain for several weeks. Found hypoxic, septic shock with L sided empyema.  Recent admission for traumatic ICH after fall and dc'd to SNF but left AMA. S/P VATS 2/20. PMH includes: ETOH, depression, compression fractures.   OT comments  Pt with gradual progress towards OT goals though poor insight still noted with consistent encouragement needed to attempt tasks. Pt also perseverating on waiting for pain meds to kick in though had been similar timeframe to when the pain meds kick in for pt at home. Pt able to demo brief standing at bedside with Min A and RW for Total A peri care d/t bowel incontinence. Pt required encouragement to stand for this as he planned to just have staff assist him bed level for this. Educated re: need for OOB activities, back precautions for comfort during LB ADLs. Continue to rec SNF rehab as pt continues to require physical assist for basic self care tasks.    Recommendations for follow up therapy are one component of a multi-disciplinary discharge planning process, led by the attending physician.  Recommendations may be updated based on patient status, additional functional criteria and insurance authorization.    Follow Up Recommendations  Skilled nursing-short term rehab (<3 hours/day)    Assistance Recommended at Discharge Frequent or constant Supervision/Assistance  Patient can return home with the following  A lot of help with walking and/or transfers;A lot of help with bathing/dressing/bathroom;Assistance with cooking/housework;Help with stairs or ramp for entrance;Assist for transportation   Equipment Recommendations  BSC/3in1;Other (comment) (RW)    Recommendations for Other Services      Precautions / Restrictions  Precautions Precautions: Fall Precaution Comments: monitor O2 (does not wear at baseline) Restrictions Weight Bearing Restrictions: No       Mobility Bed Mobility Overal bed mobility: Modified Independent Bed Mobility: Supine to Sit, Sit to Supine     Supine to sit: Modified independent (Device/Increase time) Sit to supine: Modified independent (Device/Increase time)   General bed mobility comments: increased time, HOB elevated    Transfers Overall transfer level: Needs assistance Equipment used: Rolling walker (2 wheels) Transfers: Sit to/from Stand Sit to Stand: Min assist           General transfer comment: to stand for peri care with RW     Balance Overall balance assessment: Needs assistance Sitting-balance support: No upper extremity supported, Feet supported Sitting balance-Leahy Scale: Good     Standing balance support: Bilateral upper extremity supported Standing balance-Leahy Scale: Poor                             ADL either performed or assessed with clinical judgement   ADL Overall ADL's : Needs assistance/impaired             Lower Body Bathing: Total assistance;Sit to/from stand Lower Body Bathing Details (indicate cue type and reason): for peri care in standing with smear noted on bed pad. Pt initially resistant to stand for this, citing "they have just been doing it in the bed". Reinforced that this was not how pt does peri care at home     Lower Body Dressing: Moderate assistance;Bed level Lower Body Dressing Details (indicate cue type and reason): able to bring B feet to self easily in bed. educated  on using this strategy at home to minimize back pain. Likely requires assist for LB dressing in standing               General ADL Comments: Resistant to OOB attempts, perseverating on waiting for pain meds to kick in and if he gets too worked up, they Morris work at all. Educated on back precautions for comfort     Extremity/Trunk Assessment Upper Extremity Assessment Upper Extremity Assessment: Generalized weakness   Lower Extremity Assessment Lower Extremity Assessment: Defer to PT evaluation        Vision   Vision Assessment?: No apparent visual deficits   Perception     Praxis      Cognition Arousal/Alertness: Awake/alert Behavior During Therapy: WFL for tasks assessed/performed Overall Cognitive Status: Impaired/Different from baseline Area of Impairment: Attention, Memory, Problem solving, Awareness, Safety/judgement                   Current Attention Level: Selective Memory: Decreased short-term memory   Safety/Judgement: Decreased awareness of safety, Decreased awareness of deficits Awareness: Emergent Problem Solving: Slow processing, Decreased initiation, Difficulty sequencing, Requires verbal cues General Comments: pt with poor awareness of deficits, self limiting. often says "you dont understand" when provided with logical responses.        Exercises Exercises: Other exercises Other Exercises Other Exercises: IS x 5 up to 1741mL    Shoulder Instructions       General Comments SpO2 90-93% on O2, HR low 100s. BP WFL. Pt reports it takes "45 min" for his pain meds to kick in. Pt unable to recall when he had this medication but per chart, was 40 min prior to OT attempts - educated pt on this who backtracked and says "well it works different here"    Pertinent Vitals/ Pain       Pain Assessment Pain Assessment: Faces Faces Pain Scale: Hurts little more Pain Location: back, L hip, L ribs Pain Descriptors / Indicators: Discomfort Pain Intervention(s): Monitored during session, Premedicated before session  Home Living                                          Prior Functioning/Environment              Frequency  Min 2X/week        Progress Toward Goals  OT Goals(current goals can now be found in the care plan section)   Progress towards OT goals: Progressing toward goals  Acute Rehab OT Goals Patient Stated Goal: have xray done, wait for pain meds to kick in OT Goal Formulation: With patient Time For Goal Achievement: 09/27/21 Potential to Achieve Goals: Fair ADL Goals Pt Will Perform Grooming: sitting;with set-up Pt Will Perform Lower Body Dressing: with mod assist;with adaptive equipment;sit to/from stand;sitting/lateral leans Pt Will Transfer to Toilet: with mod assist;bedside commode;stand pivot transfer Pt Will Perform Toileting - Clothing Manipulation and hygiene: with set-up;sitting/lateral leans Pt/caregiver will Perform Home Exercise Program: Increased strength;Both right and left upper extremity;With written HEP provided Additional ADL Goal #1: Pt will recall and complete 3 step ADL task with no more than minimal cueing in a non-distracting enviroment.  Plan Discharge plan remains appropriate;Frequency remains appropriate    Co-evaluation                 AM-PAC OT "6 Clicks" Daily Activity     Outcome Measure  Help from another person eating meals?: None Help from another person taking care of personal grooming?: A Little Help from another person toileting, which includes using toliet, bedpan, or urinal?: Total Help from another person bathing (including washing, rinsing, drying)?: A Lot Help from another person to put on and taking off regular upper body clothing?: A Little Help from another person to put on and taking off regular lower body clothing?: A Lot 6 Click Score: 15    End of Session Equipment Utilized During Treatment: Oxygen;Rolling walker (2 wheels)  OT Visit Diagnosis: Other abnormalities of gait and mobility (R26.89);Muscle weakness (generalized) (M62.81);Pain;Other symptoms and signs involving cognitive function   Activity Tolerance Patient tolerated treatment well   Patient Left in bed;with call bell/phone within reach;with bed alarm set   Nurse  Communication Mobility status        Time: 4103-0131 OT Time Calculation (min): 28 min  Charges: OT General Charges $OT Visit: 1 Visit OT Treatments $Self Care/Home Management : 8-22 mins $Therapeutic Activity: 8-22 mins  Malachy Chamber, OTR/L Acute Rehab Services Office: [phone]   Layla Maw 09/18/2021, 8:49 AM

## 2021-09-18 NOTE — Progress Notes (Addendum)
° °   °  [address]       [place] [address]             [phone]       7 Days Post-Op Procedure(s) (LRB): VIDEO ASSISTED THORACOSCOPY (VATS)/DECORTICATION (Left)  Subjective: Patient still has some "pain all over". He states his breathing is not quite normal but improved since admsission  Objective: Vital signs in last 24 hours: Temp:  [97.9 F (36.6 C)-98.9 F (37.2 C)] 97.9 F (36.6 C) (02/27 0305) Pulse Rate:  [80-100] 80 (02/27 0305) Cardiac Rhythm: Normal sinus rhythm (02/26 1900) Resp:  [13-16] 13 (02/27 0305) BP: (110-147)/(73-92) 110/84 (02/27 0305) SpO2:  [90 %-97 %] 93 % (02/27 0305) Weight:  [85.5 kg] 85.5 kg (02/27 0305)      Intake/Output from previous day: 02/26 0701 - 02/27 0700 In: 840 [P.O.:840] Out: 3425 [Urine:3425]   Physical Exam:  Cardiovascular: RRR Pulmonary: Clear to auscultation on right and slightly diminished left base Abdomen: Soft, non tender, bowel sounds present. Extremities: Chronic venous stasis changes Wounds: Clean and dry.  No erythema or signs of infection.   Lab Results: CBC: Recent Labs    09/16/21 0122 09/18/21 0619  WBC 9.6 8.8  HGB 11.7* 12.0*  HCT 35.7* 35.6*  PLT 349 434*   BMET:  Recent Labs    09/18/21 0619  NA 135  K 4.0  CL 98  CO2 31  GLUCOSE 132*  BUN <5*  CREATININE 0.49*  CALCIUM 8.0*    PT/INR: No results for input(s): LABPROT, INR in the last 72 hours. ABG:  INR: Will add last result for INR, ABG once components are confirmed Will add last 4 CBG results once components are confirmed  Assessment/Plan:  1. CV - SR. 2.  Pulmonary - On 2 liters of oxygen via [place]. CXR this am appears to show small, stable left apical pneumothorax and minor subcutaneous emphysema left lateral chest wall. Encourage incentive spirometer 3. ID-WBC decreased to 8800. On Ceftriaxone for Strep Intermedius 4. History of alcohol abuse-CIWA protocol 5. Deconditioned-continue PT/OT 6. Follow up  arranged;will see PRN  Sharalyn Ink ZimmermanPA-C 09/18/2021,7:48 AM [phone]   Agree with above CXR looks good  Remo Lipps C. Roxan Hockey, MD [facility] [phone]

## 2021-09-18 NOTE — Care Management Important Message (Signed)
Important Message  Patient Details  Name: Lance House MRN: [id number] Date of Birth: 1951-06-27   Medicare Important Message Given:  Yes     [name] 09/18/2021, 3:10 PM

## 2021-09-18 NOTE — TOC Progression Note (Signed)
Transition of Care [facility]) - Progression Note    Patient Details  Name: Lance House MRN: [id number] Date of Birth: [birth year]  Transition of Care [facility]) CM/SW [place], LCSW Phone Number: 09/18/2021, 3:19 PM  Clinical Narrative:    CSW spoke with patient regarding discharge plan and explained SNF recommendation. Patient stated last time he went to [facility] and left the same day because he did not like it there. The previous ED visit he was denied by insurance to go to SNF so he reports preferring to return home with the home health that he was set up with at [facility]. CSW explained that we could try SNF authorization again since he had a medical decline and procedures completed this admission but patient continued to state that he would rather return home. CSW asked if he felt that his wife would be able to handle his needs at home and he stated that she would. 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 CSW expressed understanding. Patient reported having a walker and potty chair at home. He asked about getting a urinal and CSW explained he could take the one he is currently using in the hospital. He confirmed address and PCP. CSW confirmed with [facility] that patient is active with them.   Fl2 completed in the event patient changes his mind.      Expected Discharge Plan: [place] Barriers to Discharge: Continued Medical Work up  Expected Discharge Plan and Services Expected Discharge Plan: [place] In-house Referral: Clinical Social Work Discharge Planning Services: CM Consult Post Acute Care Choice: [place] arrangements for the past 2 months: Mobile Home                                       Social Determinants of Health (SDOH) Interventions    Readmission Risk Interventions No flowsheet data found.

## 2021-09-18 NOTE — Progress Notes (Signed)
PROGRESS NOTE                                                                                                                                                                                                             Patient Demographics:    Lance House, is a 71 y.o. male, DOB - 1950/12/19, HQI:[id number]  Outpatient Primary MD for the patient is [place] date - 09/10/2021    No chief complaint on file.      Brief Narrative (HPI from H&P)   -  71 year old gentleman who lives in [place] with history of smoking, alcohol abuse recent admission for traumatic intracranial hemorrhage after a fall, he was admitted on 08/15/2021 and discharged on 08/24/2021 for this issue.  He went to [facility] but was sent back to [facility] ER within a few hours because of various complaints.  Due to lack of bed availability he was in the ER for over a week, subsequently developed some chest pain and work-up showed left-sided pleural effusion/empyema and he was admitted to [facility] by critical care team on 09/10/2021.  He was seen by cardiothoracic surgery underwent VATS procedure along with chest tube placement on 09/11/2021.  His chest tubes were removed by cardiothoracic surgery on 09/17/2021, he was stabilized and transferred to my care on 09/18/2021 on day 8 of his  hospital stay.   Subjective:    [name] today has, No headache, mild left chest tube site pleuritic chest pain, No abdominal pain - No Nausea, No new weakness tingling or numbness, no SOB.   Assessment  & Plan :     Septic Shock due to left-sided pneumonia and empyema - seen by PCCM and cardiothoracic surgery underwent VATS procedure with chest tube placement on 09/11/2021, sepsis pathophysiology has resolved, pleural fluid growing strep intermedius.  Currently on Rocephin.  Chest tube removed on 09/17/2021.  Continue  supportive care.  Encouraged to sit in chair use I-S for pulmonary toiletry.  Advance activity.  Monitor clinically.  2.  Cute hypoxic respiratory failure.  Due to #1 above.  Resolved.  3.  HX of alcohol and tobacco abuse.  Counseled to quit all.  4.  Recent ICH admission after a fall due to alcohol abuse.  PT OT advance activity  may require SNF.  5.  Mild PCM.  Protein supplements.  6.  Unstageable decubitus ulcer.  Unclear onset, wound care following.  7.  History of gout.  On allopurinol.   8.  Chronic pain.  Minimize narcotic and benzo use as much as possible.        Condition - Fair  Family Communication  :  None present  Code Status :  Full  Consults  :  PCCM, TCTS  PUD Prophylaxis :    Procedures  :     L. Lung VATS - Chest tube placement by TCTS - chest tube removed 09/17/2021.      Disposition Plan  :    Status is: Inpatient  DVT Prophylaxis  :    heparin injection 5,000 Units Start: 09/10/21 2200 SCDs Start: 09/10/21 1815   Lab Results  Component Value Date   PLT 434 (H) 09/18/2021    Diet :  Diet Order             Diet regular Room service appropriate? Yes; Fluid consistency: Thin  Diet effective now                    Inpatient Medications  Scheduled Meds:  (feeding supplement) PROSource Plus  30 mL Oral BID BM   allopurinol  300 mg Oral Daily   alum & mag hydroxide-simeth  30 mL Oral Once   bethanechol  5 mg Oral TID   bisacodyl  10 mg Oral Daily   Chlorhexidine Gluconate Cloth  6 each Topical Daily   docusate sodium  100 mg Oral BID   folic acid  1 mg Oral Daily   furosemide  20 mg Intravenous Once   gabapentin  400 mg Oral BID   heparin  5,000 Units Subcutaneous Q8H   lactose free nutrition  237 mL Oral TID WC   lidocaine  1 patch Transdermal Q24H   mouth rinse  15 mL Mouth Rinse BID   multivitamin with minerals  1 tablet Oral Daily   senna-docusate  1 tablet Oral QHS   thiamine  100 mg Oral Daily   Continuous  Infusions:  sodium chloride 50 mL/hr at 09/14/21 1800   cefTRIAXone (ROCEPHIN)  IV 2 g (09/17/21 1202)   PRN Meds:.albuterol, clonazePAM, guaiFENesin-dextromethorphan, ipratropium-albuterol, ondansetron (ZOFRAN) IV, oxyCODONE, traMADol    Time Spent in minutes  30   Lala Lund M.D on 09/18/2021 at 9:41 AM  To page go to [email or web address]   [facility] -  Office  [phone]  See all Orders from today for further details    Objective:   Vitals:   09/17/21 2328 09/18/21 0305 09/18/21 0400 09/18/21 0822  BP: 123/73 110/84  125/84  Pulse: 98 80  (!) 101  Resp: 16 13 12 20   Temp: 98.7 F (37.1 C) 97.9 F (36.6 C)  98.2 F (36.8 C)  TempSrc: Oral Oral  Oral  SpO2: 91% 93%  94%  Weight:  85.5 kg      Wt Readings from Last 3 Encounters:  09/18/21 85.5 kg  09/10/21 81.6 kg  08/24/21 81.6 kg     Intake/Output Summary (Last 24 hours) at 09/18/2021 0941 Last data filed at 09/18/2021 0300 Gross per 24 hour  Intake 360 ml  Output 3125 ml  Net -2765 ml     Physical Exam  Awake Alert, No new F.N deficits, Normal affect [place].AT,PERRAL Supple Neck, No JVD,   Symmetrical Chest wall movement, has bilateral crackles with reduced left  lower breath sounds RRR,No Gallops,Rubs or new Murmurs,  +ve B.Sounds, Abd Soft, No tenderness,   No Cyanosis, Clubbing or edema     RN pressure injury documentation: Pressure Injury 09/10/21 Coccyx Right;Lower Stage 2 -  Partial thickness loss of dermis presenting as a shallow open injury with a red, pink wound bed without slough. (Active)  09/10/21 2000  Location: Coccyx  Location Orientation: Right;Lower  Staging: Stage 2 -  Partial thickness loss of dermis presenting as a shallow open injury with a red, pink wound bed without slough.  Wound Description (Comments):   Present on Admission: Yes     Data Review:    CBC Recent Labs  Lab 09/12/21 0239 09/13/21 0545 09/14/21 0156 09/15/21 0525 09/16/21 0122 09/18/21 0619   WBC 22.9* 11.1* 10.1 11.4* 9.6 8.8  HGB 13.1 12.9* 12.4* 14.9 11.7* 12.0*  HCT 39.2 38.4* 36.1* 44.6 35.7* 35.6*  PLT 353 317 315 287 349 434*  MCV 95.6 95.0 92.8 92.7 93.7 93.2  MCH 32.0 31.9 31.9 31.0 30.7 31.4  MCHC 33.4 33.6 34.3 33.4 32.8 33.7  RDW 14.2 13.8 13.8 13.7 14.0 14.4  LYMPHSABS 0.6* 0.9 1.2 1.5 1.8  --   MONOABS 0.6 0.8 0.7 0.6 0.6  --   EOSABS 0.1 0.0 0.1 0.2 0.4  --   BASOSABS 0.1 0.0 0.0 0.1 0.1  --     Electrolytes Recent Labs  Lab 09/11/21 1644 09/12/21 0239 09/13/21 0545 09/14/21 0156 09/14/21 1115 09/15/21 0525 09/16/21 0122 09/18/21 0619  NA 136 133* 132* 127* 128* 134*  --  135  K 4.7 4.8 4.4 3.8 3.8 4.0  --  4.0  CL 104 105 102 98 94* 99  --  98  CO2 24 20* 25 22 29 27   --  31  GLUCOSE 114* 127* 85 111* 122* 80  --  132*  BUN 12 15 15 8  <5* <5*  --  <5*  CREATININE 0.67 0.64 0.57* 0.55* 0.53* 0.40*  --  0.49*  CALCIUM 8.1* 7.7* 8.0* 7.4* 7.2* 7.8*  --  8.0*  AST 19 19 18   --   --   --   --  21  ALT 16 15 17   --   --   --   --  14  ALKPHOS 77 68 67  --   --   --   --  63  BILITOT 0.5 0.8 0.2*  --   --   --   --  0.3  ALBUMIN 1.6* 1.6* 1.8*  --   --   --   --  1.6*  MG 2.3 2.1 1.9 1.6*  --  1.8 1.5* 1.7  CRP  --   --   --   --   --   --   --  0.7  PROCALCITON  --   --   --   --   --   --   --  <0.10  BNP  --   --   --   --   --   --   --  76.8      Radiology Reports DG Chest Port 1 View  Result Date: 09/17/2021 CLINICAL DATA:  Follow-up, left lung empyema. EXAM: PORTABLE CHEST 1 VIEW COMPARISON:  09/16/2021 and older exams.  CT, 09/10/2021. FINDINGS: Small left pneumothorax, without significant change from the previous day's study. Left lung base opacity is also stable consistent with atelectasis and/or residual infection. Heterogeneous bilateral areas of interstitial thickening are unchanged consistent  with fibrosis. No new lung abnormalities. Left chest tube is stable, tip along the medial left upper hemithorax. Small amount of subcutaneous  air now evident along the lateral left chest wall, new since the previous day's study. IMPRESSION: 1. Small amount of left lateral chest wall subcutaneous emphysema, new since the prior study. No other change from the previous day's study, allowing for differences in patient positioning and radiographic technique. 2. Persistent small left pneumothorax. Stable left lung base opacity. Electronically Signed   By: Lajean Manes M.D.   On: 09/17/2021 08:21   DG CHEST PORT 1 VIEW  Result Date: 09/16/2021 CLINICAL DATA:  Pneumothorax follow-up EXAM: PORTABLE CHEST 1 VIEW COMPARISON:  Yesterday FINDINGS: Small left apical pneumothorax is unchanged. Unchanged residual parenchymal opacity scattered in the bilateral lungs, densest at the left base. Unchanged pleural fluid or thickening laterally at the left chest. Normal heart size. Stable left chest tube positioning. IMPRESSION: 1. Stable left chest tube and small apical pneumothorax. 2. Stable pulmonary infiltrates. Electronically Signed   By: Jorje Guild M.D.   On: 09/16/2021 08:50   DG CHEST PORT 1 VIEW  Result Date: 09/15/2021 CLINICAL DATA:  Pneumothorax, chest tube. EXAM: PORTABLE CHEST 1 VIEW COMPARISON:  Radiograph September 14, 2021 FINDINGS: Unchanged position of the 2 left-sided thoracostomy tubes. Persistent small left apical pneumothorax. The heart size and mediastinal contours are unchanged. Similar diffuse bilateral interstitial and alveolar opacities. No visible pleural effusion. The visualized skeletal structures are unchanged. IMPRESSION: 1. Persistent small left apical pneumothorax. 2. Similar diffuse bilateral interstitial and alveolar opacities. Electronically Signed   By: Dahlia Bailiff M.D.   On: 09/15/2021 07:54

## 2021-09-18 NOTE — Plan of Care (Signed)
°  Problem: Education: Goal: Knowledge of General Education information will improve Description: Including pain rating scale, medication(s)/side effects and non-pharmacologic comfort measures Outcome: Progressing   Problem: Clinical Measurements: Goal: Ability to maintain clinical measurements within normal limits will improve Outcome: Progressing   Problem: Clinical Measurements: Goal: Diagnostic test results will improve Outcome: Progressing   Problem: Clinical Measurements: Goal: Respiratory complications will improve Outcome: Progressing   Problem: Clinical Measurements: Goal: Cardiovascular complication will be avoided Outcome: Progressing   Problem: Activity: Goal: Risk for activity intolerance will decrease Outcome: Progressing   Problem: Coping: Goal: Level of anxiety will decrease Outcome: Progressing   Problem: Pain Managment: Goal: General experience of comfort will improve Outcome: Progressing   Problem: Safety: Goal: Ability to remain free from injury will improve Outcome: Progressing   Problem: Skin Integrity: Goal: Risk for impaired skin integrity will decrease Outcome: Progressing

## 2021-09-19 DIAGNOSIS — J869 Pyothorax without fistula: Secondary | ICD-10-CM | POA: Diagnosis not present

## 2021-09-19 LAB — COMPREHENSIVE METABOLIC PANEL
ALT: 12 U/L (ref 0–44)
AST: 19 U/L (ref 15–41)
Albumin: 1.8 g/dL — ABNORMAL LOW (ref 3.5–5.0)
Alkaline Phosphatase: 56 U/L (ref 38–126)
Anion gap: 8 (ref 5–15)
BUN: <5 mg/dL — ABNORMAL LOW (ref 8–23)
CO2: 30 mmol/L (ref 22–32)
Calcium: 8.1 mg/dL — ABNORMAL LOW (ref 8.9–10.3)
Chloride: 98 mmol/L (ref 98–111)
Creatinine, Ser: 0.41 mg/dL — ABNORMAL LOW (ref 0.61–1.24)
GFR, Estimated: >60 mL/min (ref >60)
Glucose, Bld: 113 mg/dL — ABNORMAL HIGH (ref 70–99)
Potassium: 4 mmol/L (ref 3.5–5.1)
Sodium: 136 mmol/L (ref 135–145)
Total Bilirubin: 0.3 mg/dL (ref 0.3–1.2)
Total Protein: 5.3 g/dL — ABNORMAL LOW (ref 6.5–8.1)

## 2021-09-19 LAB — MAGNESIUM: Magnesium: 1.7 mg/dL (ref 1.7–2.4)

## 2021-09-19 LAB — CBC WITH DIFFERENTIAL/PLATELET
Abs Immature Granulocytes: 0.39 10*3/uL — ABNORMAL HIGH (ref 0.00–0.07)
Basophils Absolute: 0.1 10*3/uL (ref 0.0–0.1)
Basophils Relative: 0 %
Eosinophils Absolute: 0.2 10*3/uL (ref 0.0–0.5)
Eosinophils Relative: 2 %
HCT: 34.8 % — ABNORMAL LOW (ref 39.0–52.0)
Hemoglobin: 11.4 g/dL — ABNORMAL LOW (ref 13.0–17.0)
Immature Granulocytes: 3 %
Lymphocytes Relative: 13 %
Lymphs Abs: 1.5 10*3/uL (ref 0.7–4.0)
MCH: 30.5 pg (ref 26.0–34.0)
MCHC: 32.8 g/dL (ref 30.0–36.0)
MCV: 93 fL (ref 80.0–100.0)
Monocytes Absolute: 0.8 10*3/uL (ref 0.1–1.0)
Monocytes Relative: 7 %
Neutro Abs: 8.4 10*3/uL — ABNORMAL HIGH (ref 1.7–7.7)
Neutrophils Relative %: 75 %
Platelets: 498 10*3/uL — ABNORMAL HIGH (ref 150–400)
RBC: 3.74 MIL/uL — ABNORMAL LOW (ref 4.22–5.81)
RDW: 14.6 % (ref 11.5–15.5)
WBC: 11.3 10*3/uL — ABNORMAL HIGH (ref 4.0–10.5)
nRBC: 0 % (ref 0.0–0.2)

## 2021-09-19 LAB — C-REACTIVE PROTEIN: CRP: 0.6 mg/dL (ref <1.0)

## 2021-09-19 LAB — SUSCEPTIBILITY RESULT

## 2021-09-19 LAB — BRAIN NATRIURETIC PEPTIDE: B Natriuretic Peptide: 39.6 pg/mL (ref 0.0–100.0)

## 2021-09-19 LAB — SUSCEPTIBILITY, AER + ANAEROB: Source of Sample: 8680

## 2021-09-19 LAB — PROCALCITONIN: Procalcitonin: <0.1 ng/mL

## 2021-09-19 MED ORDER — OXYCODONE HCL 5 MG PO TABS
10.0000 mg | ORAL_TABLET | Freq: Four times a day (QID) | ORAL | Status: DC | PRN
  Administered 2021-09-19 – 2021-09-26 (×31): 10 mg via ORAL
  Filled 2021-09-19 (×32): qty 2

## 2021-09-19 MED ORDER — GABAPENTIN 400 MG PO CAPS
400.0000 mg | ORAL_CAPSULE | Freq: Three times a day (TID) | ORAL | Status: DC
  Administered 2021-09-19 – 2021-09-26 (×24): 400 mg via ORAL
  Filled 2021-09-19 (×24): qty 1

## 2021-09-19 NOTE — Progress Notes (Signed)
8 Days Post-Op Procedure(s) (LRB): VIDEO ASSISTED THORACOSCOPY (VATS)/DECORTICATION (Left) Subjective: C/o pain, oxycodone not strong enough or frequent enough  Objective: Vital signs in last 24 hours: Temp:  [98 F (36.7 C)-99 F (37.2 C)] 98 F (36.7 C) (02/28 0744) Pulse Rate:  [80-108] 108 (02/28 0744) Cardiac Rhythm: Normal sinus rhythm (02/27 1900) Resp:  [12-21] 17 (02/28 0744) BP: (95-140)/(66-88) 136/77 (02/28 0744) SpO2:  [91 %-95 %] 94 % (02/28 0744) Weight:  [85.6 kg] 85.6 kg (02/28 0355)  Hemodynamic parameters for last 24 hours:    Intake/Output from previous day: 02/27 0701 - 02/28 0700 In: 720 [P.O.:720] Out: 2200 [Urine:2200] Intake/Output this shift: No intake/output data recorded.  General appearance: alert, cooperative, and mild distress Neurologic: intact Heart: regular rate and rhythm Lungs: clear to auscultation bilaterally Wound: minimal serous drainage from CT site  Lab Results: Recent Labs    09/18/21 0619 09/19/21 0515  WBC 8.8 11.3*  HGB 12.0* 11.4*  HCT 35.6* 34.8*  PLT 434* 498*   BMET:  Recent Labs    09/18/21 0619 09/19/21 0515  NA 135 136  K 4.0 4.0  CL 98 98  CO2 31 30  GLUCOSE 132* 113*  BUN <5* <5*  CREATININE 0.49* 0.41*  CALCIUM 8.0* 8.1*    PT/INR: No results for input(s): LABPROT, INR in the last 72 hours. ABG    Component Value Date/Time   PHART 7.243 (L) 09/11/2021 1636   HCO3 24.5 09/11/2021 1636   TCO2 26 09/11/2021 1636   ACIDBASEDEF 4.0 (H) 09/11/2021 1636   O2SAT 94 09/11/2021 1636   CBG (last 3)  Recent Labs    09/18/21 1240  GLUCAP 130*    Assessment/Plan: S/P Procedure(s) (LRB): VIDEO ASSISTED THORACOSCOPY (VATS)/DECORTICATION (Left) - POD # 8 decortication He is currently only on 5 mg of oxycodone q8 hours which is a significantly lower dose than he was on prior to surgery. While we do want to wean as much as possible it is too early postop to do so yet. Will increase to 10 mg Q6  hours. Increase gabapentin to 400 mg TID Mobilize    LOS: 9 days    Melrose Nakayama 09/19/2021

## 2021-09-19 NOTE — Progress Notes (Signed)
Physical Therapy Treatment Patient Details Name: Lance House MRN: [id number] DOB: 06-17-1951 Today's Date: 09/19/2021   History of Present Illness Pt is a 71 y/o male presenting 2/19 with SOB, weakness, L sided chest pain for several weeks. Found hypoxic, septic shock with L sided empyema.  Recent admission for traumatic ICH after fall and dc'd to SNF but left AMA. S/P VATS 2/20. PMH includes: ETOH, depression, compression fractures.    PT Comments    On arrival, pt had had a BM in the bed and reported that he cannot tell when he needs to have a BM. "It just happens." Assisted pt with rolling and peri-care prior to OOB mobility. Patient requires incr time for all mobility as he reports he is hurting and does not want to be rushed. Ultimately, pt stood with min assist and transferred to recliner with min assist of 2 (for safety and equipment). Pt refused to ambulate, despite education that he needs to demonstrate he can walk to be safe to go home. Patient adamant that he will go home and does not comprehend that he needs to mobilize while hospitalized to regain his strength and demonstrate his ability to ambulate safely.     Recommendations for follow up therapy are one component of a multi-disciplinary discharge planning process, led by the attending physician.  Recommendations may be updated based on patient status, additional functional criteria and insurance authorization.  Follow Up Recommendations  Skilled nursing-short term rehab (<3 hours/day)     Assistance Recommended at Discharge Frequent or constant Supervision/Assistance  Patient can return home with the following Two people to help with walking and/or transfers;Two people to help with bathing/dressing/bathroom;Assistance with cooking/housework;Assistance with feeding;Direct supervision/assist for medications management;Direct supervision/assist for financial management;Assist for transportation;Help with stairs or ramp for  entrance   Equipment Recommendations  Rolling walker (2 wheels);Wheelchair (measurements PT);Wheelchair cushion (measurements PT);BSC/3in1    Recommendations for Other Services       Precautions / Restrictions Precautions Precautions: Fall Precaution Comments: monitor O2 (does not wear at baseline) Restrictions Weight Bearing Restrictions: No     Mobility  Bed Mobility Overal bed mobility: Needs Assistance Bed Mobility: Supine to Sit, Rolling Rolling: Supervision   Supine to sit: Supervision     General bed mobility comments: supervision for safety as pt slightly impulsive; when rolling leaned his head and shoulders out beyond bedrail (each direction) and then complains of rib pain after leaning against bedrail    Transfers Overall transfer level: Needs assistance Equipment used: Rolling walker (2 wheels) Transfers: Sit to/from Stand Sit to Stand: Min assist   Step pivot transfers: Min assist, +2 safety/equipment       General transfer comment: once standing, pt impulsively began stepping around to the chair and refused to ambulate    Ambulation/Gait               General Gait Details: pt refused more than pivotal steps from bed to chair   Stairs             Wheelchair Mobility    Modified Rankin (Stroke Patients Only)       Balance Overall balance assessment: Needs assistance Sitting-balance support: No upper extremity supported, Feet supported Sitting balance-Leahy Scale: Good     Standing balance support: Bilateral upper extremity supported Standing balance-Leahy Scale: Poor Standing balance comment: external rotation of bilateral hips in standing  Cognition Arousal/Alertness: Awake/alert Behavior During Therapy: WFL for tasks assessed/performed Overall Cognitive Status: Impaired/Different from baseline Area of Impairment: Attention, Memory, Problem solving, Awareness, Safety/judgement                    Current Attention Level: Selective Memory: Decreased short-term memory Following Commands: Follows one step commands consistently, Follows one step commands with increased time Safety/Judgement: Decreased awareness of safety, Decreased awareness of deficits Awareness: Emergent Problem Solving: Slow processing, Decreased initiation, Difficulty sequencing, Requires verbal cues General Comments: pt with poor awareness of deficits; does not understand that he needs to demonstrate ability to walk to be able to go home        Exercises      General Comments General comments (skin integrity, edema, etc.): Patient required increased time as he had soiled the bed and required cleaning prior to OOB. States he doesn't feel when he needs to have BM "and it just happens." Also required increased time as he repeatedly states, "you can't rush me."      Pertinent Vitals/Pain Pain Assessment Pain Assessment: Faces Faces Pain Scale: Hurts even more Pain Location: back, L hip, L ribs, especially with rolling Pain Descriptors / Indicators: Discomfort Pain Intervention(s): Limited activity within patient's tolerance    Home Living                          Prior Function            PT Goals (current goals can now be found in the care plan section) Acute Rehab PT Goals Patient Stated Goal: go home and get therapy at home Time For Goal Achievement: 09/27/21 Potential to Achieve Goals: Fair Progress towards PT goals: Progressing toward goals    Frequency    Min 2X/week      PT Plan Current plan remains appropriate    Co-evaluation              AM-PAC PT "6 Clicks" Mobility   Outcome Measure  Help needed turning from your back to your side while in a flat bed without using bedrails?: A Little Help needed moving from lying on your back to sitting on the side of a flat bed without using bedrails?: A Little Help needed moving to and from a bed to a chair  (including a wheelchair)?: A Lot Help needed standing up from a chair using your arms (e.g., wheelchair or bedside chair)?: A Lot Help needed to walk in hospital room?: Total Help needed climbing 3-5 steps with a railing? : Total 6 Click Score: 12    End of Session Equipment Utilized During Treatment: Gait belt Activity Tolerance: Patient tolerated treatment well Patient left: with call bell/phone within reach;in chair;with chair alarm set Nurse Communication: Mobility status PT Visit Diagnosis: Unsteadiness on feet (R26.81);Other abnormalities of gait and mobility (R26.89);Repeated falls (R29.6);Muscle weakness (generalized) (M62.81);History of falling (Z91.81);Difficulty in walking, not elsewhere classified (R26.2);Adult, failure to thrive (R62.7);Pain Pain - Right/Left:  (L) Pain - part of body: Hip     Time: 7169-6789 PT Time Calculation (min) (ACUTE ONLY): 33 min  Charges:  $Therapeutic Activity: 23-37 mins                      Arby Barrette, PT Acute Rehabilitation Services  Pager [phone] Office [phone]    Rexanne Mano 09/19/2021, 3:37 PM

## 2021-09-19 NOTE — Progress Notes (Signed)
PROGRESS NOTE                                                                                                                                                                                                             Patient Demographics:    Joshu Furukawa, is a 71 y.o. male, DOB - [birth year], WJX:[id number]  Outpatient Primary MD for the patient is [place]    LOS - 9  Admit date - 09/10/2021    No chief complaint on file.      Brief Narrative (HPI from H&P)   -  71 year old gentleman who lives in [place] with history of smoking, alcohol abuse recent admission for traumatic intracranial hemorrhage after a fall, he was admitted on 08/15/2021 and discharged on 08/24/2021 for this issue.  He went to [facility] but was sent back to [facility] ER within a few hours because of various complaints.  Due to lack of bed availability he was in the ER for over a week, subsequently developed some chest pain and work-up showed left-sided pleural effusion/empyema and he was admitted to [facility] by critical care team on 09/10/2021.  He was seen by cardiothoracic surgery underwent VATS procedure along with chest tube placement on 09/11/2021.  His chest tubes were removed by cardiothoracic surgery on 09/17/2021, he was stabilized and transferred to my care on 09/18/2021 on day 8 of his  hospital stay.   Subjective:   Patient in bed, appears comfortable complains of 10 out of 10 left-sided pleuritic chest pain at the site of previous chest tube, denies any headache, no fever, no shortness of breath , no abdominal pain. No new focal weakness.   Assessment  & Plan :     Septic Shock due to left-sided pneumonia and empyema - seen by PCCM and cardiothoracic surgery underwent VATS procedure with chest tube placement on 09/11/2021, sepsis pathophysiology has resolved, pleural fluid growing strep intermedius.   Currently on Rocephin.  Chest tube removed on 09/17/2021.  Continue supportive care.  Encouraged to sit in chair use I-S for pulmonary toiletry.  Advance activity.  Monitor clinically.  2.  Acute hypoxic respiratory failure.  Due to #1 above.  Resolved.  3.  HX of narcotic use, alcohol and tobacco abuse.  Counseled to smoking and alcohol and taper off narcotics.  He uses narcotics for chronic back pain.  I think his pneumonia and empyema could have been due to chronic microaspiration from being under the effect of alcohol and narcotics.  4.  Recent ICH admission after a fall due to alcohol abuse.  PT OT advance activity may require SNF.  5.  Mild PCM.  Protein supplements.  6.  Unstageable decubitus ulcer.  Unclear onset, wound care following.  7.  History of gout.  On allopurinol.   8.  Chronic pain.  Minimize narcotic and benzo use as much as possible.        Condition - Fair  Family Communication  :  None present  Code Status :  Full  Consults  :  PCCM, TCTS  PUD Prophylaxis :    Procedures  :     L. Lung VATS - Chest tube placement by TCTS - chest tube removed 09/17/2021.      Disposition Plan  :    Status is: Inpatient  DVT Prophylaxis  :    heparin injection 5,000 Units Start: 09/10/21 2200 SCDs Start: 09/10/21 1815   Lab Results  Component Value Date   PLT 498 (H) 09/19/2021    Diet :  Diet Order             Diet regular Room service appropriate? Yes; Fluid consistency: Thin  Diet effective now                    Inpatient Medications  Scheduled Meds:  (feeding supplement) PROSource Plus  30 mL Oral BID BM   allopurinol  300 mg Oral Daily   alum & mag hydroxide-simeth  30 mL Oral Once   bethanechol  5 mg Oral TID   bisacodyl  10 mg Oral Daily   Chlorhexidine Gluconate Cloth  6 each Topical Daily   docusate sodium  100 mg Oral BID   folic acid  1 mg Oral Daily   gabapentin  400 mg Oral TID   heparin  5,000 Units Subcutaneous Q8H    lactose free nutrition  237 mL Oral TID WC   lidocaine  1 patch Transdermal Q24H   mouth rinse  15 mL Mouth Rinse BID   multivitamin with minerals  1 tablet Oral Daily   senna-docusate  1 tablet Oral QHS   thiamine  100 mg Oral Daily   Continuous Infusions:  sodium chloride 50 mL/hr at 09/14/21 1800   cefTRIAXone (ROCEPHIN)  IV 2 g (09/18/21 1129)   PRN Meds:.albuterol, clonazePAM, guaiFENesin-dextromethorphan, ipratropium-albuterol, ondansetron (ZOFRAN) IV, oxyCODONE    Time Spent in minutes  30   Lala Lund M.D on 09/19/2021 at 11:07 AM  To page go to [email or web address]   [facility] -  Office  [phone]  See all Orders from today for further details    Objective:   Vitals:   09/19/21 0347 09/19/21 0355 09/19/21 0400 09/19/21 0744  BP:   125/88 136/77  Pulse:   80 (!) 108  Resp:   19 17  Temp: 99 F (37.2 C)  98.3 F (36.8 C) 98 F (36.7 C)  TempSrc: Oral  Oral Oral  SpO2:   91% 94%  Weight:  85.6 kg      Wt Readings from Last 3 Encounters:  09/19/21 85.6 kg  09/10/21 81.6 kg  08/24/21 81.6 kg     Intake/Output Summary (Last 24 hours) at 09/19/2021 1107 Last data filed at 09/19/2021 7353 Gross per 24 hour  Intake 240 ml  Output 1800 ml  Net -1560 ml     Physical Exam  Awake Alert, No new F.N deficits, in absolutely no distress whatsoever [place].AT,PERRAL Supple Neck, No JVD,   Symmetrical Chest wall movement, Good air movement bilaterally, reduced left lower breath sounds RRR,No Gallops, Rubs or new Murmurs,  +ve B.Sounds, Abd Soft, No tenderness,   No Cyanosis, Clubbing or edema      RN pressure injury documentation: Pressure Injury 09/10/21 Coccyx Right;Lower Stage 2 -  Partial thickness loss of dermis presenting as a shallow open injury with a red, pink wound bed without slough. (Active)  09/10/21 2000  Location: Coccyx  Location Orientation: Right;Lower  Staging: Stage 2 -  Partial thickness loss of dermis presenting as a shallow open  injury with a red, pink wound bed without slough.  Wound Description (Comments):   Present on Admission: Yes     Data Review:    CBC Recent Labs  Lab 09/13/21 0545 09/14/21 0156 09/15/21 0525 09/16/21 0122 09/18/21 0619 09/19/21 0515  WBC 11.1* 10.1 11.4* 9.6 8.8 11.3*  HGB 12.9* 12.4* 14.9 11.7* 12.0* 11.4*  HCT 38.4* 36.1* 44.6 35.7* 35.6* 34.8*  PLT 317 315 287 349 434* 498*  MCV 95.0 92.8 92.7 93.7 93.2 93.0  MCH 31.9 31.9 31.0 30.7 31.4 30.5  MCHC 33.6 34.3 33.4 32.8 33.7 32.8  RDW 13.8 13.8 13.7 14.0 14.4 14.6  LYMPHSABS 0.9 1.2 1.5 1.8  --  1.5  MONOABS 0.8 0.7 0.6 0.6  --  0.8  EOSABS 0.0 0.1 0.2 0.4  --  0.2  BASOSABS 0.0 0.0 0.1 0.1  --  0.1    Electrolytes Recent Labs  Lab 09/13/21 0545 09/14/21 0156 09/14/21 1115 09/15/21 0525 09/16/21 0122 09/18/21 0619 09/19/21 0515  NA 132* 127* 128* 134*  --  135 136  K 4.4 3.8 3.8 4.0  --  4.0 4.0  CL 102 98 94* 99  --  98 98  CO2 25 22 29 27   --  31 30  GLUCOSE 85 111* 122* 80  --  132* 113*  BUN 15 8 <5* <5*  --  <5* <5*  CREATININE 0.57* 0.55* 0.53* 0.40*  --  0.49* 0.41*  CALCIUM 8.0* 7.4* 7.2* 7.8*  --  8.0* 8.1*  AST 18  --   --   --   --  21 19  ALT 17  --   --   --   --  14 12  ALKPHOS 67  --   --   --   --  63 56  BILITOT 0.2*  --   --   --   --  0.3 0.3  ALBUMIN 1.8*  --   --   --   --  1.6* 1.8*  MG 1.9 1.6*  --  1.8 1.5* 1.7 1.7  CRP  --   --   --   --   --  0.7 0.6  PROCALCITON  --   --   --   --   --  <0.10 <0.10  BNP  --   --   --   --   --  76.8 39.6      Radiology Reports DG Chest 2 View  Result Date: 09/18/2021 CLINICAL DATA:  Cough, recent chest tube removal, empyema EXAM: CHEST - 2 VIEW COMPARISON:  Chest radiograph from one day prior. FINDINGS: Interval removal of left chest tube. Mild subcutaneous emphysema in the lateral left chest wall is similar. Stable cardiomediastinal silhouette  with normal heart size. No right pneumothorax. Small left hydropneumothorax, overall stable size,  with stable 5% small left apical pneumothorax component and with filling of the previously visualized small left lateral basilar pneumothorax component with a small amount of left pleural fluid. No right pleural effusion. Patchy left lung base opacity is similar. Emphysema and chronic hazy reticular opacity throughout the right lung are similar. IMPRESSION: 1. Small left hydropneumothorax, overall stable size, with stable 5% left apical pneumothorax component and filling of the previously visualized small left lateral basilar pneumothorax component with a small amount of left pleural fluid. Interval removal of left chest tube. 2. Stable patchy left lung base opacity, favor atelectasis. 3. Chronic hazy reticular opacity throughout the right lung. Emphysema. Electronically Signed   By: Ilona Sorrel M.D.   On: 09/18/2021 11:12   DG Chest Port 1 View  Result Date: 09/17/2021 CLINICAL DATA:  Follow-up, left lung empyema. EXAM: PORTABLE CHEST 1 VIEW COMPARISON:  09/16/2021 and older exams.  CT, 09/10/2021. FINDINGS: Small left pneumothorax, without significant change from the previous day's study. Left lung base opacity is also stable consistent with atelectasis and/or residual infection. Heterogeneous bilateral areas of interstitial thickening are unchanged consistent with fibrosis. No new lung abnormalities. Left chest tube is stable, tip along the medial left upper hemithorax. Small amount of subcutaneous air now evident along the lateral left chest wall, new since the previous day's study. IMPRESSION: 1. Small amount of left lateral chest wall subcutaneous emphysema, new since the prior study. No other change from the previous day's study, allowing for differences in patient positioning and radiographic technique. 2. Persistent small left pneumothorax. Stable left lung base opacity. Electronically Signed   By: Lajean Manes M.D.   On: 09/17/2021 08:21   DG CHEST PORT 1 VIEW  Result Date: 09/16/2021 CLINICAL  DATA:  Pneumothorax follow-up EXAM: PORTABLE CHEST 1 VIEW COMPARISON:  Yesterday FINDINGS: Small left apical pneumothorax is unchanged. Unchanged residual parenchymal opacity scattered in the bilateral lungs, densest at the left base. Unchanged pleural fluid or thickening laterally at the left chest. Normal heart size. Stable left chest tube positioning. IMPRESSION: 1. Stable left chest tube and small apical pneumothorax. 2. Stable pulmonary infiltrates. Electronically Signed   By: Jorje Guild M.D.   On: 09/16/2021 08:50

## 2021-09-20 ENCOUNTER — Inpatient Hospital Stay (HOSPITAL_COMMUNITY): Payer: Medicare HMO

## 2021-09-20 DIAGNOSIS — J869 Pyothorax without fistula: Secondary | ICD-10-CM | POA: Diagnosis not present

## 2021-09-20 LAB — CBC WITH DIFFERENTIAL/PLATELET
Abs Immature Granulocytes: 0.29 10*3/uL — ABNORMAL HIGH (ref 0.00–0.07)
Basophils Absolute: 0 10*3/uL (ref 0.0–0.1)
Basophils Relative: 0 %
Eosinophils Absolute: 0.3 10*3/uL (ref 0.0–0.5)
Eosinophils Relative: 2 %
HCT: 35.6 % — ABNORMAL LOW (ref 39.0–52.0)
Hemoglobin: 11.9 g/dL — ABNORMAL LOW (ref 13.0–17.0)
Immature Granulocytes: 2 %
Lymphocytes Relative: 18 %
Lymphs Abs: 2.1 10*3/uL (ref 0.7–4.0)
MCH: 31.5 pg (ref 26.0–34.0)
MCHC: 33.4 g/dL (ref 30.0–36.0)
MCV: 94.2 fL (ref 80.0–100.0)
Monocytes Absolute: 0.9 10*3/uL (ref 0.1–1.0)
Monocytes Relative: 7 %
Neutro Abs: 8.4 10*3/uL — ABNORMAL HIGH (ref 1.7–7.7)
Neutrophils Relative %: 71 %
Platelets: 539 10*3/uL — ABNORMAL HIGH (ref 150–400)
RBC: 3.78 MIL/uL — ABNORMAL LOW (ref 4.22–5.81)
RDW: 14.9 % (ref 11.5–15.5)
WBC: 12 10*3/uL — ABNORMAL HIGH (ref 4.0–10.5)
nRBC: 0 % (ref 0.0–0.2)

## 2021-09-20 LAB — COMPREHENSIVE METABOLIC PANEL
ALT: 15 U/L (ref 0–44)
AST: 24 U/L (ref 15–41)
Albumin: 1.9 g/dL — ABNORMAL LOW (ref 3.5–5.0)
Alkaline Phosphatase: 62 U/L (ref 38–126)
Anion gap: 7 (ref 5–15)
BUN: <5 mg/dL — ABNORMAL LOW (ref 8–23)
CO2: 31 mmol/L (ref 22–32)
Calcium: 8.4 mg/dL — ABNORMAL LOW (ref 8.9–10.3)
Chloride: 98 mmol/L (ref 98–111)
Creatinine, Ser: 0.45 mg/dL — ABNORMAL LOW (ref 0.61–1.24)
GFR, Estimated: >60 mL/min (ref >60)
Glucose, Bld: 87 mg/dL (ref 70–99)
Potassium: 4.4 mmol/L (ref 3.5–5.1)
Sodium: 136 mmol/L (ref 135–145)
Total Bilirubin: 0.4 mg/dL (ref 0.3–1.2)
Total Protein: 5.6 g/dL — ABNORMAL LOW (ref 6.5–8.1)

## 2021-09-20 LAB — MAGNESIUM: Magnesium: 1.6 mg/dL — ABNORMAL LOW (ref 1.7–2.4)

## 2021-09-20 LAB — C-REACTIVE PROTEIN: CRP: 0.7 mg/dL (ref <1.0)

## 2021-09-20 LAB — BRAIN NATRIURETIC PEPTIDE: B Natriuretic Peptide: 53 pg/mL (ref 0.0–100.0)

## 2021-09-20 LAB — PROCALCITONIN: Procalcitonin: <0.1 ng/mL

## 2021-09-20 IMAGING — DX DG CHEST 1V PORT
1 series · 1 of 1 positions shown · non-contrast
Comparison: [DATE], [DATE], [DATE]

CLINICAL DATA: Intermittent shortness of breath. Two left-sided
chest tubes removed over the past 3-4 days.

EXAM:
PORTABLE CHEST 1 VIEW

[chest ap]
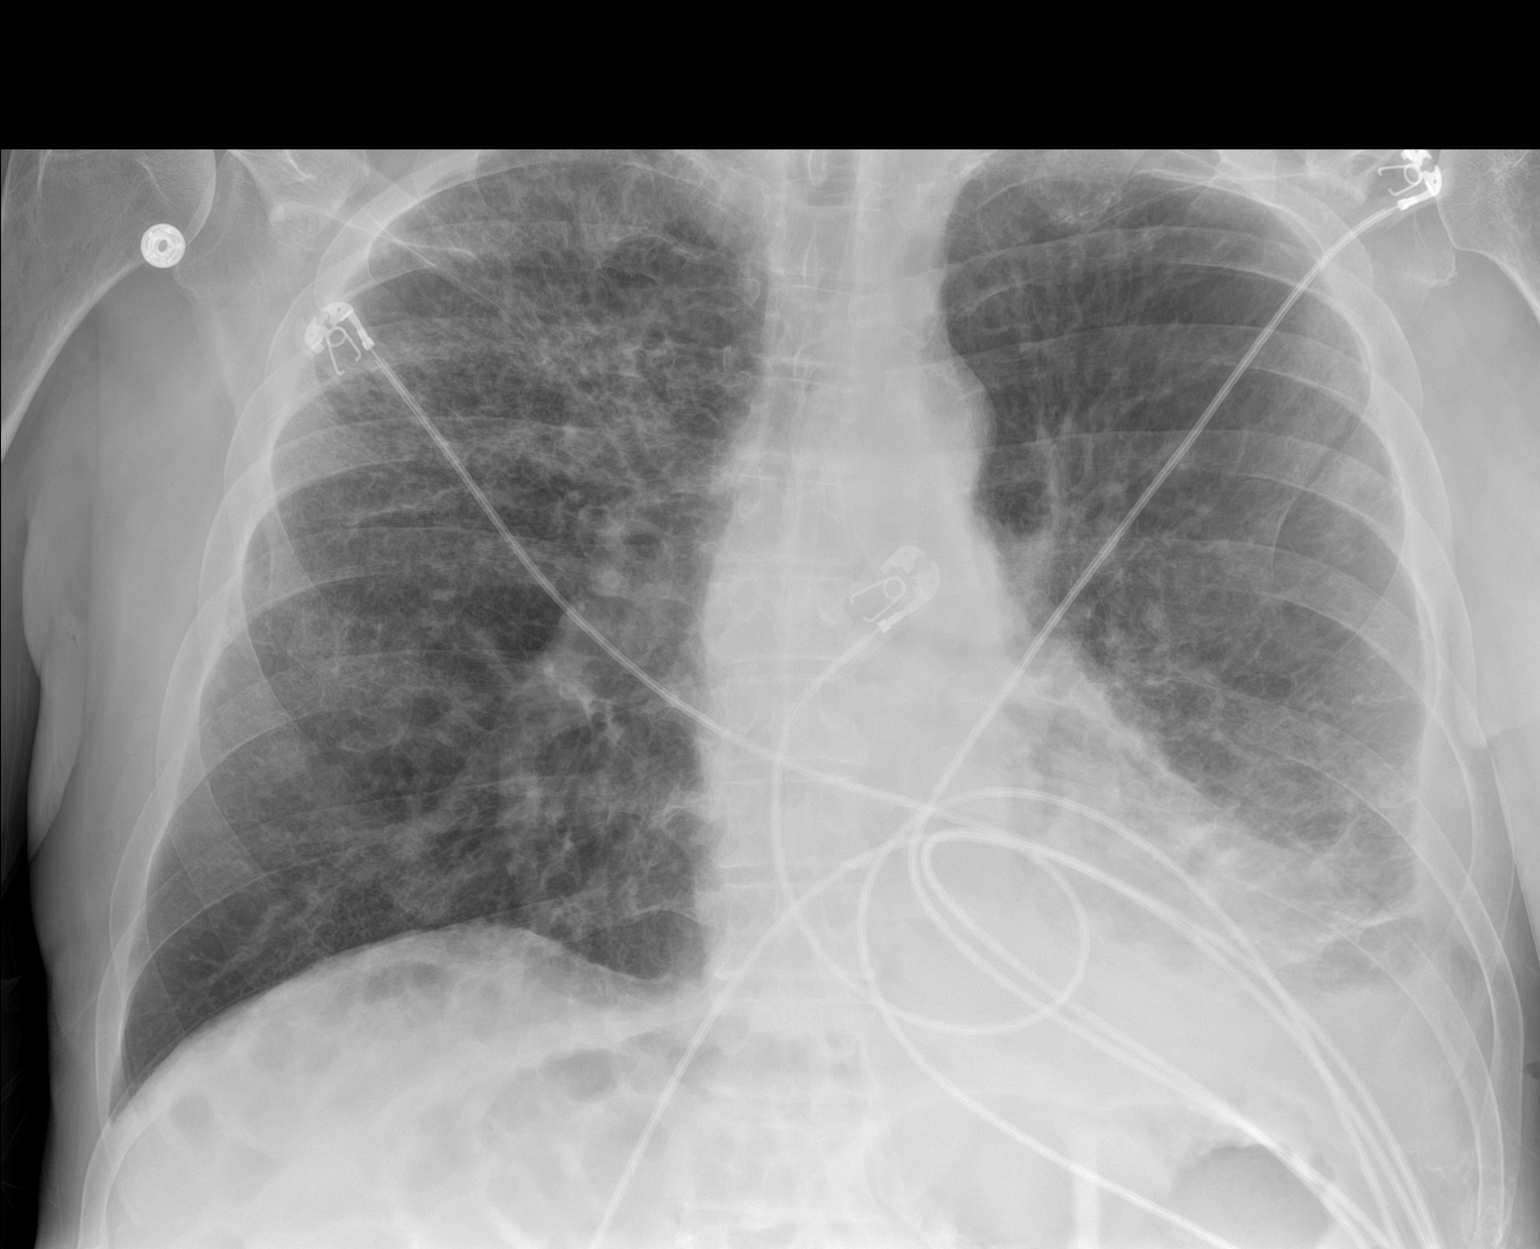

[1 of 1 positions shown; findings below may reference images not displayed]

FINDINGS: Cardiac silhouette and mediastinal contours are within normal
limits. The prior lateral left chest wall subcutaneous air is not
visualized on the current study.

Cardiac silhouette and mediastinal contours are within normal
limits.

Tiny left apical pneumothorax appears slightly decreased in size
measuring approximally 5 mm in craniocaudal dimension compared to 10
mm on [DATE]. Mild left pleural effusion with left basilar
heterogeneous and linear opacities, similar to prior. No right
pleural effusion. Superior lucent emphysematous changes and chronic
superior right lung reticular opacity and interstitial thickening
unchanged. No acute skeletal abnormality.
IMPRESSION: :
IMPRESSION: 1. Mild interval decrease in minimal left apical pneumothorax.
2. Unchanged small left pleural effusion with left basilar
heterogeneous opacity, atelectasis versus pneumonia.
3. Chronic superior right upper lung interstitial thickening and
reticular opacities.

## 2021-09-20 MED ORDER — FUROSEMIDE 10 MG/ML IJ SOLN
40.0000 mg | Freq: Once | INTRAMUSCULAR | Status: AC
Start: 2021-09-20 — End: 2021-09-20
  Administered 2021-09-20: 40 mg via INTRAVENOUS
  Filled 2021-09-20: qty 4

## 2021-09-20 MED ORDER — MAGNESIUM SULFATE 50 % IJ SOLN
3.0000 g | Freq: Once | INTRAVENOUS | Status: AC
  Administered 2021-09-20: 3 g via INTRAVENOUS
  Filled 2021-09-20: qty 6

## 2021-09-20 NOTE — Progress Notes (Signed)
PROGRESS NOTE                                                                                                                                                                                                             Patient Demographics:    Lance House, is a 71 y.o. male, DOB - 1951-05-01, SUP:[id number]  Outpatient Primary MD for the patient is [place]    LOS - 10  Admit date - 09/10/2021    No chief complaint on file.      Brief Narrative (HPI from H&P)   -  71 year old gentleman who lives in [place] with history of smoking, alcohol abuse recent admission for traumatic intracranial hemorrhage after a fall, he was admitted on 08/15/2021 and discharged on 08/24/2021 for this issue.  He went to [facility] but was sent back to [facility] ER within a few hours because of various complaints.  Due to lack of bed availability he was in the ER for over a week, subsequently developed some chest pain and work-up showed left-sided pleural effusion/empyema and he was admitted to [facility] by critical care team on 09/10/2021.  He was seen by cardiothoracic surgery underwent VATS procedure along with chest tube placement on 09/11/2021.  His chest tubes were removed by cardiothoracic surgery on 09/17/2021, he was stabilized and transferred to my care on 09/18/2021 on day 8 of his  hospital stay.   Subjective:   Patient in bed, appears comfortable, denies any headache, no fever, complains of 10 out of 10 left-sided chest pain but eating breakfast and appears to be in no distress, no shortness of breath , no abdominal pain. No new focal weakness.    Assessment  & Plan :     Septic Shock due to left-sided pneumonia and empyema - seen by PCCM and cardiothoracic surgery underwent VATS procedure with chest tube placement on 09/11/2021, sepsis pathophysiology has resolved, pleural fluid growing strep  intermedius.  Currently on Rocephin.  Chest tube removed on 09/17/2021.  Continue supportive care.  Encouraged to sit in chair use I-S for pulmonary toiletry.  Advance activity.  Monitor clinically.  Repeat chest x-ray on 09/20/2021 as he complains of 10 out of 10 pain.  2.  Acute hypoxic respiratory failure.  Due to #1 above.  Resolved.  3.  HX  of narcotic use, alcohol and tobacco abuse.  Counseled to smoking and alcohol and taper off narcotics gradually but he does not seem to be motivated.  He uses narcotics at home for chronic back pain.  I think his pneumonia and empyema could have been due to chronic microaspiration from being under the effect of alcohol and narcotics.  4.  Recent [facility] admission after a fall due to alcohol abuse.  PT OT advance activity may require SNF.  5.  Mild PCM.  Protein supplements.  6.  Unstageable decubitus ulcer.  Unclear onset, wound care following.  7.  History of gout.  On allopurinol.   8.  Chronic pain.  Minimize narcotic and benzo use as much as possible.        Condition - Fair  Family Communication  :  None present  Code Status :  Full  Consults  :  PCCM, TCTS  PUD Prophylaxis :    Procedures  :     L. Lung VATS - Chest tube placement by TCTS - chest tube removed 09/17/2021.      Disposition Plan  :    Status is: Inpatient  DVT Prophylaxis  :    heparin injection 5,000 Units Start: 09/10/21 2200 SCDs Start: 09/10/21 1815   Lab Results  Component Value Date   PLT 539 (H) 09/20/2021    Diet :  Diet Order             Diet regular Room service appropriate? Yes; Fluid consistency: Thin  Diet effective now                    Inpatient Medications  Scheduled Meds:  (feeding supplement) PROSource Plus  30 mL Oral BID BM   allopurinol  300 mg Oral Daily   alum & mag hydroxide-simeth  30 mL Oral Once   bethanechol  5 mg Oral TID   bisacodyl  10 mg Oral Daily   Chlorhexidine Gluconate Cloth  6 each Topical Daily    docusate sodium  100 mg Oral BID   folic acid  1 mg Oral Daily   gabapentin  400 mg Oral TID   heparin  5,000 Units Subcutaneous Q8H   lactose free nutrition  237 mL Oral TID WC   lidocaine  1 patch Transdermal Q24H   mouth rinse  15 mL Mouth Rinse BID   multivitamin with minerals  1 tablet Oral Daily   senna-docusate  1 tablet Oral QHS   thiamine  100 mg Oral Daily   Continuous Infusions:  sodium chloride 50 mL/hr at 09/14/21 1800   cefTRIAXone (ROCEPHIN)  IV 200 mL/hr at 09/20/21 0457   PRN Meds:.albuterol, clonazePAM, guaiFENesin-dextromethorphan, ipratropium-albuterol, ondansetron (ZOFRAN) IV, oxyCODONE    Time Spent in minutes  30   Lala Lund M.D on 09/20/2021 at 10:52 AM  To page go to [email or web address]   [facility] -  Office  [phone]  See all Orders from today for further details    Objective:   Vitals:   09/19/21 2310 09/20/21 0300 09/20/21 0500 09/20/21 0839  BP: 121/76 115/76  121/69  Pulse: 95 81  97  Resp: 13 14  17   Temp: 98.8 F (37.1 C) 98.1 F (36.7 C)  98.6 F (37 C)  TempSrc: Oral Oral  Oral  SpO2:      Weight:   80.5 kg     Wt Readings from Last 3 Encounters:  09/20/21 80.5 kg  09/10/21 81.6 kg  08/24/21  81.6 kg     Intake/Output Summary (Last 24 hours) at 09/20/2021 1052 Last data filed at 09/20/2021 7989 Gross per 24 hour  Intake --  Output 3050 ml  Net -3050 ml     Physical Exam  Awake Alert, No new F.N deficits, in absolutely no distress whatsoever and having breakfast [place].AT,PERRAL Supple Neck, No JVD,   Symmetrical Chest wall movement, Good air movement bilaterally, worse bibasilar breath sounds left more than right RRR,No Gallops, Rubs or new Murmurs,  +ve B.Sounds, Abd Soft, No tenderness,   No Cyanosis, Clubbing or edema       RN pressure injury documentation: Pressure Injury 09/10/21 Coccyx Right;Lower Stage 2 -  Partial thickness loss of dermis presenting as a shallow open injury with a red, pink wound  bed without slough. (Active)  09/10/21 2000  Location: Coccyx  Location Orientation: Right;Lower  Staging: Stage 2 -  Partial thickness loss of dermis presenting as a shallow open injury with a red, pink wound bed without slough.  Wound Description (Comments):   Present on Admission: Yes     Data Review:    CBC Recent Labs  Lab 09/14/21 0156 09/15/21 0525 09/16/21 0122 09/18/21 0619 09/19/21 0515 09/20/21 0251  WBC 10.1 11.4* 9.6 8.8 11.3* 12.0*  HGB 12.4* 14.9 11.7* 12.0* 11.4* 11.9*  HCT 36.1* 44.6 35.7* 35.6* 34.8* 35.6*  PLT 315 287 349 434* 498* 539*  MCV 92.8 92.7 93.7 93.2 93.0 94.2  MCH 31.9 31.0 30.7 31.4 30.5 31.5  MCHC 34.3 33.4 32.8 33.7 32.8 33.4  RDW 13.8 13.7 14.0 14.4 14.6 14.9  LYMPHSABS 1.2 1.5 1.8  --  1.5 2.1  MONOABS 0.7 0.6 0.6  --  0.8 0.9  EOSABS 0.1 0.2 0.4  --  0.2 0.3  BASOSABS 0.0 0.1 0.1  --  0.1 0.0    Electrolytes Recent Labs  Lab 09/14/21 1115 09/15/21 0525 09/16/21 0122 09/18/21 0619 09/19/21 0515 09/20/21 0251  NA 128* 134*  --  135 136 136  K 3.8 4.0  --  4.0 4.0 4.4  CL 94* 99  --  98 98 98  CO2 29 27  --  31 30 31   GLUCOSE 122* 80  --  132* 113* 87  BUN <5* <5*  --  <5* <5* <5*  CREATININE 0.53* 0.40*  --  0.49* 0.41* 0.45*  CALCIUM 7.2* 7.8*  --  8.0* 8.1* 8.4*  AST  --   --   --  21 19 24   ALT  --   --   --  14 12 15   ALKPHOS  --   --   --  63 56 62  BILITOT  --   --   --  0.3 0.3 0.4  ALBUMIN  --   --   --  1.6* 1.8* 1.9*  MG  --  1.8 1.5* 1.7 1.7 1.6*  CRP  --   --   --  0.7 0.6 0.7  PROCALCITON  --   --   --  <0.10 <0.10 <0.10  BNP  --   --   --  76.8 39.6 53.0      Radiology Reports DG Chest 2 View  Result Date: 09/18/2021 CLINICAL DATA:  Cough, recent chest tube removal, empyema EXAM: CHEST - 2 VIEW COMPARISON:  Chest radiograph from one day prior. FINDINGS: Interval removal of left chest tube. Mild subcutaneous emphysema in the lateral left chest wall is similar. Stable cardiomediastinal silhouette with  normal heart size. No  right pneumothorax. Small left hydropneumothorax, overall stable size, with stable 5% small left apical pneumothorax component and with filling of the previously visualized small left lateral basilar pneumothorax component with a small amount of left pleural fluid. No right pleural effusion. Patchy left lung base opacity is similar. Emphysema and chronic hazy reticular opacity throughout the right lung are similar. IMPRESSION: 1. Small left hydropneumothorax, overall stable size, with stable 5% left apical pneumothorax component and filling of the previously visualized small left lateral basilar pneumothorax component with a small amount of left pleural fluid. Interval removal of left chest tube. 2. Stable patchy left lung base opacity, favor atelectasis. 3. Chronic hazy reticular opacity throughout the right lung. Emphysema. Electronically Signed   By: Ilona Sorrel M.D.   On: 09/18/2021 11:12   DG Chest Port 1 View  Result Date: 09/17/2021 CLINICAL DATA:  Follow-up, left lung empyema. EXAM: PORTABLE CHEST 1 VIEW COMPARISON:  09/16/2021 and older exams.  CT, 09/10/2021. FINDINGS: Small left pneumothorax, without significant change from the previous day's study. Left lung base opacity is also stable consistent with atelectasis and/or residual infection. Heterogeneous bilateral areas of interstitial thickening are unchanged consistent with fibrosis. No new lung abnormalities. Left chest tube is stable, tip along the medial left upper hemithorax. Small amount of subcutaneous air now evident along the lateral left chest wall, new since the previous day's study. IMPRESSION: 1. Small amount of left lateral chest wall subcutaneous emphysema, new since the prior study. No other change from the previous day's study, allowing for differences in patient positioning and radiographic technique. 2. Persistent small left pneumothorax. Stable left lung base opacity. Electronically Signed   By: Lajean Manes M.D.   On: 09/17/2021 08:21

## 2021-09-20 NOTE — Progress Notes (Signed)
9 Days Post-Op Procedure(s) (LRB): ?VIDEO ASSISTED THORACOSCOPY (VATS)/DECORTICATION (Left) ?Subjective: ?Still c/o pain. Higher dose of oxycodone is helping some ? ?Objective: ?Vital signs in last 24 hours: ?Temp:  [98.1 ?F (36.7 ?C)-98.8 ?F (37.1 ?C)] 98.6 ?F (37 ?C) (03/01 4098) ?Pulse Rate:  [81-97] 97 (03/01 0839) ?Cardiac Rhythm: Sinus tachycardia;Bundle branch block (03/01 0801) ?Resp:  [13-19] 17 (03/01 0839) ?BP: (115-144)/(69-82) 121/69 (03/01 0839) ?SpO2:  [93 %-94 %] 93 % (02/28 1603) ?Weight:  [80.5 kg] 80.5 kg (03/01 0500) ? ?Hemodynamic parameters for last 24 hours: ?  ? ?Intake/Output from previous day: ?02/28 0701 - 03/01 0700 ?In: -  ?Out: 3050 [Urine:3050] ?Intake/Output this shift: ?No intake/output data recorded. ? ?General appearance: alert, cooperative, and no distress ?Lungs: rhonchi bilaterally ?Wound: minimal serous drainage ? ?Lab Results: ?Recent Labs  ?  09/19/21 ?1191 09/20/21 ?0251  ?WBC 11.3* 12.0*  ?HGB 11.4* 11.9*  ?HCT 34.8* 35.6*  ?PLT 498* 539*  ? ?BMET:  ?Recent Labs  ?  09/19/21 ?4782 09/20/21 ?0251  ?NA 136 136  ?K 4.0 4.4  ?CL 98 98  ?CO2 30 31  ?GLUCOSE 113* 87  ?BUN <5* <5*  ?CREATININE 0.41* 0.45*  ?CALCIUM 8.1* 8.4*  ?  ?PT/INR: No results for input(s): LABPROT, INR in the last 72 hours. ?ABG ?   ?Component Value Date/Time  ? PHART 7.243 (L) 09/11/2021 1636  ? HCO3 24.5 09/11/2021 1636  ? TCO2 26 09/11/2021 1636  ? ACIDBASEDEF 4.0 (H) 09/11/2021 1636  ? O2SAT 94 09/11/2021 1636  ? ?CBG (last 3)  ?Recent Labs  ?  09/18/21 ?1240  ?GLUCAP 130*  ? ? ?Assessment/Plan: ?S/P Procedure(s) (LRB): ?VIDEO ASSISTED THORACOSCOPY (VATS)/DECORTICATION (Left) ?Stable from a surgical standpoint ?Pain control improved- reiterated to him that goal is manageable, not total elimination of pain ? ? LOS: 10 days  ? ? ?Lance House ?09/20/2021 ? ? ?

## 2021-09-21 ENCOUNTER — Other Ambulatory Visit (HOSPITAL_COMMUNITY): Payer: Self-pay

## 2021-09-21 LAB — CBC WITH DIFFERENTIAL/PLATELET
Abs Immature Granulocytes: 0.21 10*3/uL — ABNORMAL HIGH (ref 0.00–0.07)
Basophils Absolute: 0 10*3/uL (ref 0.0–0.1)
Basophils Relative: 0 %
Eosinophils Absolute: 0.3 10*3/uL (ref 0.0–0.5)
Eosinophils Relative: 2 %
HCT: 35.1 % — ABNORMAL LOW (ref 39.0–52.0)
Hemoglobin: 11.6 g/dL — ABNORMAL LOW (ref 13.0–17.0)
Immature Granulocytes: 2 %
Lymphocytes Relative: 15 %
Lymphs Abs: 1.9 10*3/uL (ref 0.7–4.0)
MCH: 31.2 pg (ref 26.0–34.0)
MCHC: 33 g/dL (ref 30.0–36.0)
MCV: 94.4 fL (ref 80.0–100.0)
Monocytes Absolute: 1 10*3/uL (ref 0.1–1.0)
Monocytes Relative: 8 %
Neutro Abs: 9.1 10*3/uL — ABNORMAL HIGH (ref 1.7–7.7)
Neutrophils Relative %: 73 %
Platelets: 548 10*3/uL — ABNORMAL HIGH (ref 150–400)
RBC: 3.72 MIL/uL — ABNORMAL LOW (ref 4.22–5.81)
RDW: 15.1 % (ref 11.5–15.5)
WBC: 12.6 10*3/uL — ABNORMAL HIGH (ref 4.0–10.5)
nRBC: 0 % (ref 0.0–0.2)

## 2021-09-21 LAB — COMPREHENSIVE METABOLIC PANEL
ALT: 16 U/L (ref 0–44)
AST: 20 U/L (ref 15–41)
Albumin: 2 g/dL — ABNORMAL LOW (ref 3.5–5.0)
Alkaline Phosphatase: 63 U/L (ref 38–126)
Anion gap: 5 (ref 5–15)
BUN: <5 mg/dL — ABNORMAL LOW (ref 8–23)
CO2: 31 mmol/L (ref 22–32)
Calcium: 8.3 mg/dL — ABNORMAL LOW (ref 8.9–10.3)
Chloride: 98 mmol/L (ref 98–111)
Creatinine, Ser: 0.52 mg/dL — ABNORMAL LOW (ref 0.61–1.24)
GFR, Estimated: >60 mL/min (ref >60)
Glucose, Bld: 102 mg/dL — ABNORMAL HIGH (ref 70–99)
Potassium: 4.5 mmol/L (ref 3.5–5.1)
Sodium: 134 mmol/L — ABNORMAL LOW (ref 135–145)
Total Bilirubin: 0.2 mg/dL — ABNORMAL LOW (ref 0.3–1.2)
Total Protein: 5.6 g/dL — ABNORMAL LOW (ref 6.5–8.1)

## 2021-09-21 LAB — AEROBIC/ANAEROBIC CULTURE W GRAM STAIN (SURGICAL/DEEP WOUND)

## 2021-09-21 LAB — C-REACTIVE PROTEIN: CRP: 0.9 mg/dL (ref <1.0)

## 2021-09-21 LAB — BRAIN NATRIURETIC PEPTIDE: B Natriuretic Peptide: 28.3 pg/mL (ref 0.0–100.0)

## 2021-09-21 LAB — MAGNESIUM: Magnesium: 1.8 mg/dL (ref 1.7–2.4)

## 2021-09-21 LAB — PROCALCITONIN: Procalcitonin: <0.1 ng/mL

## 2021-09-21 MED ORDER — OXYCODONE HCL 5 MG PO TABS
10.0000 mg | ORAL_TABLET | Freq: Four times a day (QID) | ORAL | 0 refills | Status: DC | PRN
  Filled 2021-09-21: qty 12, 2d supply, fill #0

## 2021-09-21 MED ORDER — ALBUTEROL SULFATE (2.5 MG/3ML) 0.083% IN NEBU
INHALATION_SOLUTION | RESPIRATORY_TRACT | 1 refills | Status: DC
  Filled 2021-09-21: qty 180, 14d supply, fill #0

## 2021-09-21 MED ORDER — THIAMINE HCL 100 MG PO TABS
100.0000 mg | ORAL_TABLET | Freq: Every day | ORAL | 0 refills | Status: DC
  Filled 2021-09-21: qty 30, 30d supply, fill #0

## 2021-09-21 MED ORDER — FUROSEMIDE 10 MG/ML IJ SOLN
40.0000 mg | Freq: Once | INTRAMUSCULAR | Status: DC
Start: 2021-09-21 — End: 2021-09-22
  Filled 2021-09-21: qty 4

## 2021-09-21 MED ORDER — LIDOCAINE 5 % EX PTCH
1.0000 | MEDICATED_PATCH | CUTANEOUS | 0 refills | Status: AC
  Filled 2021-09-21: qty 5, 5d supply, fill #0

## 2021-09-21 MED ORDER — CEPHALEXIN 500 MG PO CAPS
500.0000 mg | ORAL_CAPSULE | Freq: Three times a day (TID) | ORAL | 0 refills | Status: AC
Start: 2021-09-21 — End: 2021-10-01
  Filled 2021-09-21: qty 30, 10d supply, fill #0

## 2021-09-21 MED ORDER — FOLIC ACID 1 MG PO TABS
1.0000 mg | ORAL_TABLET | Freq: Every day | ORAL | 0 refills | Status: DC
  Filled 2021-09-21: qty 30, 30d supply, fill #0

## 2021-09-21 MED ORDER — SENNOSIDES-DOCUSATE SODIUM 8.6-50 MG PO TABS
1.0000 | ORAL_TABLET | Freq: Every evening | ORAL | 0 refills | Status: DC | PRN
  Filled 2021-09-21: qty 15, 15d supply, fill #0

## 2021-09-21 MED ORDER — DOCUSATE SODIUM 100 MG PO CAPS
200.0000 mg | ORAL_CAPSULE | Freq: Every day | ORAL | 0 refills | Status: DC
  Filled 2021-09-21: qty 20, 10d supply, fill #0

## 2021-09-21 MED ORDER — IPRATROPIUM-ALBUTEROL 0.5-2.5 (3) MG/3ML IN SOLN
3.0000 mL | Freq: Four times a day (QID) | RESPIRATORY_TRACT | 0 refills | Status: DC | PRN
  Filled 2021-09-21: qty 360, 30d supply, fill #0

## 2021-09-21 MED ORDER — IPRATROPIUM BROMIDE 0.02 % IN SOLN
RESPIRATORY_TRACT | 0 refills | Status: DC
Start: 2021-09-21 — End: 2021-09-27
  Filled 2021-09-21: qty 150, 14d supply, fill #0

## 2021-09-21 NOTE — Care Management Important Message (Signed)
Important Message ? ?Patient Details  ?Name: Lance House ?MRN: [id number] ?Date of Birth: 1951/02/26 ? ? ?Medicare Important Message Given:  Yes ? ? ? ? ?Alessa Mazur ?09/21/2021, 2:42 PM ?

## 2021-09-21 NOTE — TOC Progression Note (Signed)
Transition of Care (TOC) - Progression Note  ? ? ?Patient Details  ?Name: Lance House ?MRN: [id number] ?Date of Birth: 02-06-1951 ? ?Transition of Care (TOC) CM/SW Contact  ?Benard Halsted, LCSW ?Phone Number: ?09/21/2021, 3:54 PM ? ?Clinical Narrative:    ?10am-Per RNCM, she spoke with patient's spouse regarding discharge home today. Spouse told RNCM that patient had to go to SNF or to a shelter. She contacted patient and now patient agreeable to SNF.  ? ?CSW spoke with patient who verified that he is agreeable to SNF now and wants to walk the hall without the IV pole getting in his way. CSW provided the two bed offers and he selected [facility] ([place]). CSW requested facility begin insurance authorization and submitted info to [employer] for review.  ? ?2pm-Per OT, patient able to return home and was able to mobilize today. Therapy going to work with patient to ensure he can walk. Discussed with [facility] leadership who does not believe patient will be approved by [employer]. Will follow up.  ? ? ?Expected Discharge Plan: [place] ?Barriers to Discharge: Continued Medical Work up ? ?Expected Discharge Plan and Services ?Expected Discharge Plan: [place] ?In-house Referral: Clinical Social Work ?Discharge Planning Services: CM Consult ?Post Acute Care Choice: Home Health ?Living arrangements for the past 2 months: Mobile Home ?Expected Discharge Date: 09/21/21               ?  ?  ?  ?  ?  ?  ?  ?  ?  ?  ? ? ?Social Determinants of Health (SDOH) Interventions ?  ? ?Readmission Risk Interventions ?No flowsheet data found. ? ?

## 2021-09-21 NOTE — Discharge Summary (Signed)
Lance House HER:[id number] DOB: 1951/01/02 DOA: 09/10/2021  PCP: [facility]  Admit date: 09/10/2021  Discharge date: 09/21/2021  Admitted From: SNF   Disposition:  Home - refused SNF   Recommendations for Outpatient Follow-up to PCP:   Follow up with PCP in 1-2 weeks  PCP Please obtain BMP/CBC, 2 view CXR in 1week,  (see Discharge instructions)   PCP Please follow up on the following pending results: Kindly check CT scan reports from last admission including CT chest and CT head.  Needs outpatient MRI brain with and without contrast in 1 to 2 weeks postdischarge.   Home Health: PT,OT   Equipment/Devices: see below  Consultations: PCCM, cardiothoracic surgery Discharge Condition: Stable    CODE STATUS: Full    Diet Recommendation: Heart Healthy   Diet Order             Diet - low sodium heart healthy           Diet regular Room service appropriate? Yes; Fluid consistency: Thin  Diet effective now                   CC - fever  Brief history of present illness from the day of admission and additional interim summary    71 year-old gentleman who lives in [facility] with history of smoking, alcohol abuse recent admission for traumatic intracranial hemorrhage after a fall, he was admitted on 08/15/2021 and discharged on 08/24/2021 for this issue.  He went to [facility] but was sent back to [facility] ER within a few hours because of various complaints.  Due to lack of bed availability he was in the ER for over a week, subsequently developed some chest pain and work-up showed left-sided pleural effusion/empyema and he was admitted to [facility] by critical care team on 09/10/2021.  He was seen by cardiothoracic surgery underwent VATS procedure along with chest tube placement  on 09/11/2021.   His chest tubes were removed by cardiothoracic surgery on 09/17/2021, he was stabilized and transferred to my care on 09/18/2021 on day 8 of his  hospital stay.                                                                 Hospital Course      Septic Shock due to left-sided pneumonia and empyema - seen by PCCM and cardiothoracic surgery underwent VATS procedure with chest tube placement on 09/11/2021, sepsis pathophysiology has resolved, pleural fluid growing strep intermedius.  He has already been on 2 weeks of IV antibiotics will get another week of Keflex completing 3 weeks of total antibiotics.  Chest tube removed on 09/17/2021.  Continue supportive care.  Encouraged to sit in chair use I-S for pulmonary toiletry.  Advance activity.  Monitor clinically.  X-rays are stable, clinically stable and will be discharged, he qualified for SNF but refused placement.  Will be discharged home with home health with outpatient PCP and cardiothoracic surgery follow-up.   2.  Acute hypoxic respiratory failure.  Due to #1 above.  Resolved.   3.  HX of narcotic use, alcohol and tobacco abuse.  Counseled to smoking and alcohol and taper off narcotics gradually but he does not seem to be motivated.  He uses narcotics at home for chronic back pain.  I think his pneumonia and empyema could have been due to chronic microaspiration from being under the effect of alcohol and narcotics.   4.  Recent [facility] admission after a fall due to alcohol abuse.  PT OT advance activity may require SNF.   5.  Mild PCM.  Protein supplements.   6.  Unstageable decubitus ulcer.  Unclear onset, wound care following.  Requested to stay in chair in the daytime and keep his bottom clean and dry at all times.   7.  History of gout.  On allopurinol.    8.  Chronic pain.  Minimize narcotic and benzo use as much as possible.  Unseld not to overuse.  9.  Nonspecific CT head findings.  Request PCP to check MRI brain in 1 to 2  weeks post discharge.  Patient also given written instructions for the same.   Discharge diagnosis     Principal Problem:   Empyema ([place]) Active Problems:   Pressure injury of skin    Discharge instructions    Discharge Instructions     Diet - low sodium heart healthy   Complete by: As directed    Discharge instructions   Complete by: As directed    Follow with Primary MD Belen in 7 days, you need an outpatient MRI of the brain to be ordered by a family physician.  In the next 1 to 2 weeks  Get CBC, CMP, 2 view Chest X ray -  checked next visit within 1 week by Primary MD    Activity: As tolerated with Full fall precautions use walker/cane & assistance as needed  Disposition Home     Diet: Heart Healthy with feeding assistance and aspiration precautions.    Special Instructions: If you have smoked or chewed Tobacco  in the last 2 yrs please stop smoking, stop any regular Alcohol  and or any Recreational drug use.  On your next visit with your primary care physician please Get Medicines reviewed and adjusted.  Please request your Prim.MD to go over all Hospital Tests and Procedure/Radiological results at the follow up, please get all Hospital records sent to your Prim MD by signing hospital release before you go home.  If you experience worsening of your admission symptoms, develop shortness of breath, life threatening emergency, suicidal or homicidal thoughts you must seek medical attention immediately by calling 911 or calling your MD immediately  if symptoms less severe.  You Must read complete instructions/literature along with all the possible adverse reactions/side effects for all the Medicines you take and that have been prescribed to you. Take any new Medicines after you have completely understood and accpet all the possible adverse reactions/side effects.   Discharge wound care:   Complete by: As directed    Keep your chest tube site clean and  dry at all times, stay in the chair at home during the daytime.   For home use only DME Nebulizer machine   Complete by: As directed  Patient needs a nebulizer to treat with the following condition: COPD (chronic obstructive pulmonary disease) (HCC)   Length of Need: 6 Months   Increase activity slowly   Complete by: As directed        Discharge Medications   Allergies as of 09/21/2021       Reactions   Ciprocin-fluocin-procin [fluocinolone] Anaphylaxis   Hip hurt   Ciprofloxacin    Colchicine    Duloxetine Diarrhea, Nausea And Vomiting, Other (See Comments)   Altered mental status        Medication List     TAKE these medications    acetaminophen 325 MG tablet Commonly known as: TYLENOL Take 2 tablets (650 mg total) by mouth every 6 (six) hours as needed for mild pain (or Fever >/= 101).   albuterol 108 (90 Base) MCG/ACT inhaler Commonly known as: VENTOLIN HFA Inhale 1-2 puffs into the lungs every 4 (four) hours as needed for shortness of breath.   allopurinol 300 MG tablet Commonly known as: ZYLOPRIM Take 300 mg by mouth daily.   cephALEXin 500 MG capsule Commonly known as: KEFLEX Take 1 capsule (500 mg total) by mouth 3 (three) times daily for 10 days.   clonazePAM 1 MG tablet Commonly known as: KLONOPIN Take 0.5 tablets (0.5 mg total) by mouth 2 (two) times daily as needed.   docusate sodium 100 MG capsule Commonly known as: COLACE Take 2 capsules (200 mg total) by mouth daily.   folic acid 1 MG tablet Commonly known as: FOLVITE Take 1 tablet (1 mg total) by mouth daily.   ipratropium-albuterol 0.5-2.5 (3) MG/3ML Soln Commonly known as: DUONEB Take 3 mLs by nebulization every 6 (six) hours as needed.   lidocaine 5 % Commonly known as: LIDODERM Place 1 patch onto the skin daily for 5 doses. Remove & Discard patch within 12 hours or as directed by MD   oxycodone 5 MG capsule Commonly known as: OXY-IR Take 2 capsules (10 mg total) by mouth every 6  (six) hours as needed for pain. What changed:  when to take this Another medication with the same name was removed. Continue taking this medication, and follow the directions you see here.   senna-docusate 8.6-50 MG tablet Commonly known as: Senokot-S Take 1 tablet by mouth at bedtime as needed for mild constipation.   thiamine 100 MG tablet Take 1 tablet (100 mg total) by mouth daily.   Vitamin D (Ergocalciferol) 1.25 MG (50000 UNIT) Caps capsule Commonly known as: DRISDOL Take 50,000 Units by mouth once a week.               Durable Medical Equipment  (From admission, onward)           Start     Ordered   09/21/21 0823  For home use only DME lightweight manual wheelchair with seat cushion  Once       Comments: Patient suffers from Empyema, weakness which impairs their ability to perform daily activities like bathing, dressing, feeding, grooming, and toileting in the home.  A cane, crutch, or walker will not resolve  issue with performing activities of daily living. A wheelchair will allow patient to safely perform daily activities. Patient is not able to propel themselves in the home using a standard weight wheelchair due to arm weakness, endurance, and general weakness. Patient can self propel in the lightweight wheelchair. Length of need 6 months . Accessories: elevating leg rests (ELRs), wheel locks, extensions and anti-tippers.   09/21/21 9622  09/21/21 0822  For home use only DME Walker rolling  Once       Comments: 5 wheel  Question Answer Comment  Walker: With [place]   Patient needs a walker to treat with the following condition Weakness      09/21/21 0821   09/21/21 0822  For home use only DME oxygen  Once       Question Answer Comment  Length of Need 6 Months   Mode or (Route) Nasal cannula   Liters per Minute 2   Frequency Continuous (stationary and portable oxygen unit needed)   Oxygen conserving device Yes   Oxygen delivery system Gas       09/21/21 0821   09/21/21 0000  For home use only DME Nebulizer machine       Question Answer Comment  Patient needs a nebulizer to treat with the following condition COPD (chronic obstructive pulmonary disease) ([place])   Length of Need 6 Months      09/21/21 0835              Discharge Care Instructions  (From admission, onward)           Start     Ordered   09/21/21 0000  Discharge wound care:       Comments: Keep your chest tube site clean and dry at all times, stay in the chair at home during the daytime.   09/21/21 4492             Follow-up Information     Melrose Nakayama, MD. Go on 10/10/2021.   Specialty: Cardiothoracic Surgery Why: PA/LAT CXR to be taken (at [facility] which is in the same building as Dr. Leonarda Salon office) on 03/07 at 12:45 pm pm;Appointment time is at 1:15 pm Contact information: [address] [place] [address] [place]. Schedule an appointment as soon as possible for a visit in 1 week(s).   Contact information: [address] [place] [address] [phone]                 Major procedures and Radiology Reports - PLEASE review detailed and final reports thoroughly  -     Today   Subjective    [name] today has no headache,no chest abdominal pain,no new weakness tingling or numbness, feels much better wants to go home today.     Objective   Blood pressure 121/70, pulse 95, temperature 97.6 F (36.4 C), temperature source Oral, resp. rate 16, height 6\' 1"  (1.854 m), weight 80.5 kg, SpO2 93 %.   Intake/Output Summary (Last 24 hours) at 09/21/2021 0840 Last data filed at 09/21/2021 0525 Gross per 24 hour  Intake 1001.46 ml  Output 4125 ml  Net -3123.54 ml    Exam  Awake Alert, No new F.N deficits,    [place].AT,PERRAL Supple Neck,   Symmetrical Chest wall movement, Good air movement bilaterally, mild Rales in the left lower  base RRR,No Gallops,   +ve B.Sounds, Abd Soft, Non tender,  No Cyanosis, Clubbing or edema    Data Review   CBC w Diff:  Lab Results  Component Value Date   WBC 12.6 (H) 09/21/2021   HGB 11.6 (L) 09/21/2021   HCT 35.1 (L) 09/21/2021   PLT 548 (H) 09/21/2021   LYMPHOPCT 15 09/21/2021   MONOPCT 8 09/21/2021   EOSPCT 2 09/21/2021   BASOPCT 0 09/21/2021  CMP:  Lab Results  Component Value Date   NA 134 (L) 09/21/2021   K 4.5 09/21/2021   CL 98 09/21/2021   CO2 31 09/21/2021   BUN <5 (L) 09/21/2021   CREATININE 0.52 (L) 09/21/2021   PROT 5.6 (L) 09/21/2021   ALBUMIN 2.0 (L) 09/21/2021   BILITOT 0.2 (L) 09/21/2021   ALKPHOS 63 09/21/2021   AST 20 09/21/2021   ALT 16 09/21/2021  .   Total Time in preparing paper work, data evaluation and todays exam - 44 minutes  Lala Lund M.D on 09/21/2021 at 8:40 AM  [facility]

## 2021-09-21 NOTE — Consult Note (Signed)
[facility] CM Inpatient Consult ? ? ?09/21/2021 ? ?[name] ?08-Mar-1951 ?[id number] ? ?[facility] Follow-up: ? ?[facility] CM) ?  ?Spoke with patient at bedside and explained [facility] CM services as outpatient chronic disease and care coordination services. Patient eligible for post hospital follow up. Per review, patient refused skilled nursing level of care. ? ?Patient states that he does follow up with primary provider. States that he has transportation. Denies need for pharmacy assistance. Patient declines services at this time. [facility] CM brochure provided. ? ?Of note, [facility] Care Management services does not replace or interfere with any services that are arranged by inpatient case management or social work.  ? ?Netta Cedars, MSN, RN ?[facility] Liaison ?Mobile Phone [phone]  ?Toll free office [phone]  ?

## 2021-09-21 NOTE — Progress Notes (Signed)
Home meds are being returned back to pharmacy in a sealed bag by April G. Charge nurse at this time. ?

## 2021-09-21 NOTE — Progress Notes (Addendum)
? ?                 [address] ?           [place] [address] ?         [phone]  ? ?10 Days Post-Op ?Procedure(s) (LRB): ?VIDEO ASSISTED THORACOSCOPY (VATS)/DECORTICATION (Left) ? ?Total Length of Stay:  LOS: 11 days  ? ?Subjective: ?Patient still with a lot of pain and has not changed much over the last few days. ? ?Objective: ?Vital signs in last 24 hours: ?Temp:  [97.9 ?F (36.6 ?C)-98.6 ?F (37 ?C)] 98 ?F (36.7 ?C) (03/02 0402) ?Pulse Rate:  [78-100] 78 (03/02 0402) ?Cardiac Rhythm: Normal sinus rhythm (03/01 1935) ?Resp:  [10-19] 10 (03/02 0402) ?BP: (108-135)/(69-85) 108/71 (03/02 0402) ?SpO2:  [92 %-96 %] 96 % (03/02 0402) ?Weight:  [80.5 kg] 80.5 kg (03/01 1208) ? ?Filed Weights  ? 09/19/21 0355 09/20/21 0500 09/20/21 1208  ?Weight: 85.6 kg 80.5 kg 80.5 kg  ? ? ?Weight change: 0 kg  ? ?  ? ?Intake/Output from previous day: ?03/01 0701 - 03/02 0700 ?In: 1001.5 [P.O.:480; I.V.:321.5; IV Piggyback:200] ?Out: 4125 [Urine:4125] ? ?Intake/Output this shift: ?No intake/output data recorded. ? ?Current Meds: ?Scheduled Meds: ? (feeding supplement) PROSource Plus  30 mL Oral BID BM  ? allopurinol  300 mg Oral Daily  ? alum & mag hydroxide-simeth  30 mL Oral Once  ? bethanechol  5 mg Oral TID  ? bisacodyl  10 mg Oral Daily  ? Chlorhexidine Gluconate Cloth  6 each Topical Daily  ? docusate sodium  100 mg Oral BID  ? folic acid  1 mg Oral Daily  ? gabapentin  400 mg Oral TID  ? heparin  5,000 Units Subcutaneous Q8H  ? lactose free nutrition  237 mL Oral TID WC  ? lidocaine  1 patch Transdermal Q24H  ? mouth rinse  15 mL Mouth Rinse BID  ? multivitamin with minerals  1 tablet Oral Daily  ? senna-docusate  1 tablet Oral QHS  ? thiamine  100 mg Oral Daily  ? ?Continuous Infusions: ? sodium chloride 50 mL/hr at 09/14/21 1800  ? cefTRIAXone (ROCEPHIN)  IV 2 g (09/20/21 1138)  ? ?PRN Meds:.albuterol, clonazePAM, guaiFENesin-dextromethorphan, ipratropium-albuterol, ondansetron (ZOFRAN) IV,  oxyCODONE ? ?General appearance: no distress. Patient awake and alert  ?Heart: Slightly tachy ?Lungs: Clear on right and slightly diminished left basilar breath sounds ?Abdomen: Soft, non tender, bowel sounds ?Extremities: SCDs ?Wound: Chest tube wounds open (no suture). Posterior wound with slight serous drainage. Surgical wound is  clean, dry and no sight of infection ? ?Lab Results: ?CBC: ?Recent Labs  ?  09/20/21 ?0251 09/21/21 ?7371  ?WBC 12.0* 12.6*  ?HGB 11.9* 11.6*  ?HCT 35.6* 35.1*  ?PLT 539* 548*  ? ? ?BMET:  ?Recent Labs  ?  09/20/21 ?0251 09/21/21 ?0626  ?NA 136 134*  ?K 4.4 4.5  ?CL 98 98  ?CO2 31 31  ?GLUCOSE 87 102*  ?BUN <5* <5*  ?CREATININE 0.45* 0.52*  ?CALCIUM 8.4* 8.3*  ? ?  ?CMET: ?Lab Results  ?Component Value Date  ? WBC 12.6 (H) 09/21/2021  ? HGB 11.6 (L) 09/21/2021  ? HCT 35.1 (L) 09/21/2021  ? PLT 548 (H) 09/21/2021  ? GLUCOSE 102 (H) 09/21/2021  ? TRIG 51 09/12/2021  ? ALT 16 09/21/2021  ? AST 20 09/21/2021  ? NA 134 (L) 09/21/2021  ? K 4.5 09/21/2021  ? CL 98 09/21/2021  ? CREATININE 0.52 (  L) 09/21/2021  ? BUN <5 (L) 09/21/2021  ? CO2 31 09/21/2021  ? TSH 1.319 08/15/2021  ? INR 1.4 (H) 09/10/2021  ? ? ? ? ?PT/INR:  ?No results for input(s): LABPROT, INR in the last 72 hours. ? ?Radiology: DG Chest Port 1 View ? ?Result Date: 09/20/2021 ?CLINICAL DATA:  Intermittent shortness of breath. Two left-sided chest tubes removed over the past 3-4 days. EXAM: PORTABLE CHEST 1 VIEW COMPARISON:  09/18/2021, 09/17/2021, 09/16/2021 FINDINGS: Cardiac silhouette and mediastinal contours are within normal limits. The prior lateral left chest wall subcutaneous air is not visualized on the current study. Cardiac silhouette and mediastinal contours are within normal limits. Tiny left apical pneumothorax appears slightly decreased in size measuring approximally 5 mm in craniocaudal dimension compared to 10 mm on 09/18/2021. Mild left pleural effusion with left basilar heterogeneous and linear opacities,  similar to prior. No right pleural effusion. Superior lucent emphysematous changes and chronic superior right lung reticular opacity and interstitial thickening unchanged. No acute skeletal abnormality. IMPRESSION:: IMPRESSION: 1. Mild interval decrease in minimal left apical pneumothorax. 2. Unchanged small left pleural effusion with left basilar heterogeneous opacity, atelectasis versus pneumonia. 3. Chronic superior right upper lung interstitial thickening and reticular opacities. Electronically Signed   By: Yvonne Kendall M.D.   On: 09/20/2021 11:38   ? ? ?Assessment/Plan: ?S/P Procedure(s) (LRB): ?VIDEO ASSISTED THORACOSCOPY (VATS)/DECORTICATION (Left) ?CV-Slightly tachy. ?Pulmonary-On room air. Encourage incentive spirometer.  ?ID-WBC this am 12,600. On Ceftriaxone. Culture left pleural peel shows rare Strep Intermedius ?4. History of alcohol abuse-CIWA protocol ?5. On Heparin 5000 units tid for DVT prophylaxis ?6. Regarding pain control, has Lidocaine patch, Oxy IR 10 mg Q 6 PRN, and Gabapentin 400 mg tid. ?7. Hyponatremia-sodium slightly decreased to 134 ?8. Deconditioned-continue PT/OT ? ?Ellin Fitzgibbons Liston Alba PA-C ?09/21/2021 7:13 AM  ? ? ? ? ? ? ?

## 2021-09-21 NOTE — Progress Notes (Signed)
Occupational Therapy Treatment Patient Details Name: Lance House MRN: [id number] DOB: 09-10-1950 Today's Date: 09/21/2021   History of present illness Pt is a 71 y/o male presenting 2/19 with SOB, weakness, L sided chest pain for several weeks. Found hypoxic, septic shock with L sided empyema.  Recent admission for traumatic ICH after fall and dc'd to SNF but left AMA. S/P VATS 2/20. PMH includes: ETOH, depression, compression fractures.   OT comments  Coordinated with RN for pain premedication for OT session with pt initially attempting to decline therapy session due to needing pain meds to kick in. After reorienting that it had been 45 min and pain meds should be working, pt agreeable. Overall, pt able to demo bathroom mobility using RW, toileting hygiene and LB dressing tasks with Supervision only. Pt does endorse feeling weaker (OT reinforced need for consistent OOB activity) and more stable with RW. Pt with poor awareness, self limiting behaviors and not receptive to logical reasoning (adamant he could do nothing with IV line attached though eventually demo he could perform tasks with IV intact). However, based on physical performance today and refusing SNF rehab, can be appropriate to DC with [facility] therapy services with intermittent supervision. Would emphasize med mgmt assist to maximize med compliance at DC d/t decreased intrinsic motivation for self care tasks.    Recommendations for follow up therapy are one component of a multi-disciplinary discharge planning process, led by the attending physician.  Recommendations may be updated based on patient status, additional functional criteria and insurance authorization.    Follow Up Recommendations  Home health OT (has been refusing SNF rehab)    Assistance Recommended at Discharge Intermittent Supervision/Assistance  Patient can return home with the following  A little help with walking and/or transfers;A little help with  bathing/dressing/bathroom;Assistance with cooking/housework;Direct supervision/assist for medications management;Help with stairs or ramp for entrance   Equipment Recommendations  BSC/3in1;Other (comment);Wheelchair (measurements OT);Wheelchair cushion (measurements OT) (Rolling walker)    Recommendations for Other Services      Precautions / Restrictions Precautions Precautions: Fall Precaution Comments: monitor O2 (does not wear at baseline) Restrictions Weight Bearing Restrictions: No       Mobility Bed Mobility Overal bed mobility: Modified Independent Bed Mobility: Supine to Sit, Sit to Supine     Supine to sit: Modified independent (Device/Increase time) Sit to supine: Modified independent (Device/Increase time)        Transfers Overall transfer level: Needs assistance Equipment used: Rolling walker (2 wheels) Transfers: Sit to/from Stand Sit to Stand: Supervision           General transfer comment: for safety though no assist actually needed     Balance Overall balance assessment: Needs assistance Sitting-balance support: No upper extremity supported, Feet supported Sitting balance-Leahy Scale: Good     Standing balance support: Bilateral upper extremity supported Standing balance-Leahy Scale: Fair Standing balance comment: able to stand and reach to flush toilet without LOB, stand statically without support, feels better with RW for mobility                           ADL either performed or assessed with clinical judgement   ADL Overall ADL's : Needs assistance/impaired                     Lower Body Dressing: Supervision/safety;Sit to/from stand Lower Body Dressing Details (indicate cue type and reason): able to manage B socks, reach easily Toilet Transfer: Supervision/safety;Ambulation;Rolling walker (  2 wheels) Toilet Transfer Details (indicate cue type and reason): no LOB, slower pace, able to problem solve getting RW in/out of  small bathroom` Toileting- Clothing Manipulation and Hygiene: Set up;Sitting/lateral lean;Sit to/from stand Toileting - Clothing Manipulation Details (indicate cue type and reason): after repeatedly ensuring pt he was able to wipe his bottom with IV attached to arm (OT assisting with line to hold out of way), able to lean side to side to wipe     Functional mobility during ADLs: Supervision/safety;Rolling walker (2 wheels) General ADL Comments: Continued poor insight (attempted to decline therapy due to needing pain meds to set in though ensured pt it had been 45 min since he had the meds) but able to move physically well, does endorse feeling weak but able to manage basic ADLs/short distance in room without issue    Extremity/Trunk Assessment Upper Extremity Assessment Upper Extremity Assessment: Overall WFL for tasks assessed   Lower Extremity Assessment Lower Extremity Assessment: Defer to PT evaluation        Vision   Vision Assessment?: No apparent visual deficits   Perception     Praxis      Cognition Arousal/Alertness: Awake/alert Behavior During Therapy: WFL for tasks assessed/performed, Agitated Overall Cognitive Status: Impaired/Different from baseline Area of Impairment: Memory, Problem solving, Awareness, Safety/judgement                     Memory: Decreased short-term memory Following Commands: Follows one step commands consistently, Follows one step commands with increased time Safety/Judgement: Decreased awareness of safety, Decreased awareness of deficits Awareness: Emergent Problem Solving: Slow processing, Decreased initiation, Difficulty sequencing, Requires verbal cues General Comments: pt with continued poor insight, difficult to reason with logically. pt becomes agitated and adamant for staff to agree with him even when incorrect (pt reported he needed IV disconnected to walk and to be able to wipe bottom - though able to demo ability to do so  eventually). Questionable memory and awareness as pt now reporting today "every says I have been refusing rehab but I havent" ....had refused and became angry when rehab was mentioned with this OT's session a few days ago        Exercises      Shoulder Instructions       General Comments VSS on RA    Pertinent Vitals/ Pain       Pain Assessment Pain Assessment: Faces Faces Pain Scale: No hurt Pain Intervention(s): Premedicated before session  Home Living                                          Prior Functioning/Environment              Frequency  Min 2X/week        Progress Toward Goals  OT Goals(current goals can now be found in the care plan section)  Progress towards OT goals: Progressing toward goals  Acute Rehab OT Goals Patient Stated Goal: get IV disconnected OT Goal Formulation: With patient Time For Goal Achievement: 09/27/21 Potential to Achieve Goals: Fair ADL Goals Pt Will Perform Grooming: sitting;with set-up Pt Will Perform Lower Body Dressing: with mod assist;with adaptive equipment;sit to/from stand;sitting/lateral leans Pt Will Transfer to Toilet: with mod assist;bedside commode;stand pivot transfer Pt Will Perform Toileting - Clothing Manipulation and hygiene: with set-up;sitting/lateral leans Pt/caregiver will Perform Home Exercise Program: Increased strength;Both right and  left upper extremity;With written HEP provided Additional ADL Goal #1: Pt will recall and complete 3 step ADL task with no more than minimal cueing in a non-distracting enviroment.  Plan Discharge plan needs to be updated    Co-evaluation                 AM-PAC OT "6 Clicks" Daily Activity     Outcome Measure   Help from another person eating meals?: None Help from another person taking care of personal grooming?: A Little Help from another person toileting, which includes using toliet, bedpan, or urinal?: A Little Help from another person  bathing (including washing, rinsing, drying)?: A Little Help from another person to put on and taking off regular upper body clothing?: A Little Help from another person to put on and taking off regular lower body clothing?: A Little 6 Click Score: 19    End of Session Equipment Utilized During Treatment: Rolling walker (2 wheels)  OT Visit Diagnosis: Other abnormalities of gait and mobility (R26.89);Muscle weakness (generalized) (M62.81);Pain;Other symptoms and signs involving cognitive function   Activity Tolerance Patient tolerated treatment well   Patient Left in bed;with call bell/phone within reach;with bed alarm set   Nurse Communication Mobility status        Time: 1105-1140 OT Time Calculation (min): 35 min  Charges: OT General Charges $OT Visit: 1 Visit OT Treatments $Self Care/Home Management : 23-37 mins  Malachy Chamber, OTR/L Acute Rehab Services Office: [phone]   Layla Maw 09/21/2021, 12:56 PM

## 2021-09-21 NOTE — Progress Notes (Signed)
Physical Therapy Treatment ?Patient Details ?Name: Lance House ?MRN: [id number] ?DOB: [birth year] ?Today's Date: 09/21/2021 ? ? ?History of Present Illness Pt is a 71 y/o male presenting 2/19 with SOB, weakness, L sided chest pain for several weeks. Found hypoxic, septic shock with L sided empyema.  Recent admission for traumatic ICH after fall and dc'd to SNF but left AMA. S/P VATS 2/20. PMH includes: ETOH, depression, compression fractures. ? ?  ?PT Comments  ? ? Patient seen for PT as pt now agreeable to SNF and updated status requested by [facility] team. Patient moves very slowly and easily distracts himself from the task at hand. Requires redirection and becomes frustrated when this is provided. Ultimately, pt walked 50 ft with RW and min assist. Patient with very poor walker management (pushing too far ahead, running into furniture, losing balance when trying to get walker unstuck from furniture leg). Patient discharge recommendation remains for SNF for increasing independence and safety. Patient now agreeable. Understands per [facility] team that his insurance company may not approve rehab. Educated that he needs to work on mobility and strengthening every time the opportunity arises.  ?   ?Recommendations for follow up therapy are one component of a multi-disciplinary discharge planning process, led by the attending physician.  Recommendations may be updated based on patient status, additional functional criteria and insurance authorization. ? ?Follow Up Recommendations ? Skilled nursing-short term rehab (<3 hours/day) (patient now agreeable) ?  ?  ?Assistance Recommended at Discharge Frequent or constant Supervision/Assistance  ?Patient can return home with the following Assistance with cooking/housework;Direct supervision/assist for medications management;Direct supervision/assist for financial management;Assist for transportation;Help with stairs or ramp for entrance;A little help with walking and/or transfers ?   ?Equipment Recommendations ? Rolling walker (2 wheels);Wheelchair (measurements PT);Wheelchair cushion (measurements PT);BSC/3in1  ?  ?Recommendations for Other Services   ? ? ?  ?Precautions / Restrictions Precautions ?Precautions: Fall ?Precaution Comments: monitor O2 (does not wear at baseline) ?Restrictions ?Weight Bearing Restrictions: No  ?  ? ?Mobility ? Bed Mobility ?Overal bed mobility: Modified Independent ?Bed Mobility: Supine to Sit, Sit to Supine ?  ?  ?Supine to sit: Modified independent (Device/Increase time) ?Sit to supine: Modified independent (Device/Increase time) ?  ?  ?  ? ?Transfers ?Overall transfer level: Needs assistance ?Equipment used: Rolling walker (2 wheels) ?Transfers: Sit to/from Stand ?Sit to Stand: Min guard ?  ?  ?  ?  ?  ?General transfer comment: minguard for balance upon standing (posterior imbalance) ?  ? ?Ambulation/Gait ?Ambulation/Gait assistance: Min assist ?Gait Distance (Feet): 50 Feet ?Assistive device: Rolling walker (2 wheels) ?Gait Pattern/deviations: Step-through pattern, Decreased step length - right, Decreased step length - left, Trunk flexed, Drifts right/left, Wide base of support ?Gait velocity: decreased ?  ?  ?General Gait Details: pt pushes RW too far ahead, runs into objects with unsafe repositioning of RW as trying to get unstuck from furniture. Very poor walker safety; will improve proximity to RW for several feet and then reverts back to pushing it too far ahead of him ? ? ?Stairs ?  ?  ?  ?  ?  ? ? ?Wheelchair Mobility ?  ? ?Modified Rankin (Stroke Patients Only) ?  ? ? ?  ?Balance Overall balance assessment: Needs assistance ?Sitting-balance support: No upper extremity supported, Feet supported ?Sitting balance-Leahy Scale: Good ?  ?  ?Standing balance support: Bilateral upper extremity supported, Reliant on assistive device for balance ?Standing balance-Leahy Scale: Poor ?  ?  ?  ?  ?  ?  ?  ?  ?  ?  ?  ?  ?  ? ?  ?  Cognition Arousal/Alertness:  Awake/alert ?Behavior During Therapy: [facility] for tasks assessed/performed, Agitated ?Overall Cognitive Status: Impaired/Different from baseline ?Area of Impairment: Memory, Problem solving, Awareness, Safety/judgement ?  ?  ?  ?  ?  ?  ?  ?  ?  ?  ?Memory: Decreased short-term memory ?Following Commands: Follows one step commands consistently, Follows one step commands with increased time ?Safety/Judgement: Decreased awareness of safety, Decreased awareness of deficits ?Awareness: Emergent ?Problem Solving: Slow processing, Decreased initiation, Difficulty sequencing, Requires verbal cues ?General Comments: Pt does not recall morning session with OT and thinks it was yesterday. Slow to follow commands (distracts himself and goes onto tangent and has to be reminded of task at hand). ?  ?  ? ?  ?Exercises   ? ?  ?General Comments General comments (skin integrity, edema, etc.): saO2 88% on RA with ambulation ?  ?  ? ?Pertinent Vitals/Pain Pain Assessment ?Pain Assessment: Faces ?Faces Pain Scale: Hurts whole lot ?Pain Location: back, L hip, L ribs, ?Pain Descriptors / Indicators: Discomfort, Sore ?Pain Intervention(s): Premedicated before session  ? ? ?Home Living   ?  ?  ?  ?  ?  ?  ?  ?  ?  ?   ?  ?Prior Function    ?  ?  ?   ? ?PT Goals (current goals can now be found in the care plan section) Acute Rehab PT Goals ?Patient Stated Goal: go to rehab and get stronger ?Time For Goal Achievement: 09/27/21 ?Potential to Achieve Goals: Fair ?Progress towards PT goals: Progressing toward goals ? ?  ?Frequency ? ? ? Min 2X/week ? ? ? ?  ?PT Plan Current plan remains appropriate  ? ? ?Co-evaluation   ?  ?  ?  ?  ? ?  ?AM-PAC PT "6 Clicks" Mobility   ?Outcome Measure ? Help needed turning from your back to your side while in a flat bed without using bedrails?: None ?Help needed moving from lying on your back to sitting on the side of a flat bed without using bedrails?: None ?Help needed moving to and from a bed to a chair  (including a wheelchair)?: A Little ?Help needed standing up from a chair using your arms (e.g., wheelchair or bedside chair)?: A Little ?Help needed to walk in hospital room?: A Little ?Help needed climbing 3-5 steps with a railing? : Total ?6 Click Score: 18 ? ?  ?End of Session Equipment Utilized During Treatment: Gait belt ?Activity Tolerance: Patient limited by fatigue ?Patient left: with call bell/phone within reach;in bed ?Nurse Communication: Mobility status ?PT Visit Diagnosis: Unsteadiness on feet (R26.81);Other abnormalities of gait and mobility (R26.89);Repeated falls (R29.6);Muscle weakness (generalized) (M62.81);History of falling (Z91.81);Difficulty in walking, not elsewhere classified (R26.2);Adult, failure to thrive (R62.7);Pain ?Pain - Right/Left:  (L) ?Pain - part of body: Hip ?  ? ? ?Time: 7793-9030 ?PT Time Calculation (min) (ACUTE ONLY): 37 min ? ?Charges:  $Gait Training: 8-22 mins ?$Therapeutic Activity: 8-22 mins          ?          ? ? ?Arby Barrette, PT ?Acute Rehabilitation Services  ?Pager [phone] ?Office [phone] ? ? ? ?Jeanie Cooks Jamala Kohen ?09/21/2021, 5:03 PM ? ?

## 2021-09-22 LAB — CBC WITH DIFFERENTIAL/PLATELET
Abs Immature Granulocytes: 0.15 10*3/uL — ABNORMAL HIGH (ref 0.00–0.07)
Basophils Absolute: 0.1 10*3/uL (ref 0.0–0.1)
Basophils Relative: 0 %
Eosinophils Absolute: 0.3 10*3/uL (ref 0.0–0.5)
Eosinophils Relative: 2 %
HCT: 33.6 % — ABNORMAL LOW (ref 39.0–52.0)
Hemoglobin: 11.3 g/dL — ABNORMAL LOW (ref 13.0–17.0)
Immature Granulocytes: 1 %
Lymphocytes Relative: 18 %
Lymphs Abs: 2.1 10*3/uL (ref 0.7–4.0)
MCH: 31.6 pg (ref 26.0–34.0)
MCHC: 33.6 g/dL (ref 30.0–36.0)
MCV: 93.9 fL (ref 80.0–100.0)
Monocytes Absolute: 1 10*3/uL (ref 0.1–1.0)
Monocytes Relative: 8 %
Neutro Abs: 8.3 10*3/uL — ABNORMAL HIGH (ref 1.7–7.7)
Neutrophils Relative %: 71 %
Platelets: 502 10*3/uL — ABNORMAL HIGH (ref 150–400)
RBC: 3.58 MIL/uL — ABNORMAL LOW (ref 4.22–5.81)
RDW: 15.1 % (ref 11.5–15.5)
WBC: 11.9 10*3/uL — ABNORMAL HIGH (ref 4.0–10.5)
nRBC: 0 % (ref 0.0–0.2)

## 2021-09-22 LAB — COMPREHENSIVE METABOLIC PANEL
ALT: 16 U/L (ref 0–44)
AST: 20 U/L (ref 15–41)
Albumin: 1.9 g/dL — ABNORMAL LOW (ref 3.5–5.0)
Alkaline Phosphatase: 66 U/L (ref 38–126)
Anion gap: 8 (ref 5–15)
BUN: 5 mg/dL — ABNORMAL LOW (ref 8–23)
CO2: 27 mmol/L (ref 22–32)
Calcium: 8.4 mg/dL — ABNORMAL LOW (ref 8.9–10.3)
Chloride: 98 mmol/L (ref 98–111)
Creatinine, Ser: 0.44 mg/dL — ABNORMAL LOW (ref 0.61–1.24)
GFR, Estimated: >60 mL/min (ref >60)
Glucose, Bld: 132 mg/dL — ABNORMAL HIGH (ref 70–99)
Potassium: 3.8 mmol/L (ref 3.5–5.1)
Sodium: 133 mmol/L — ABNORMAL LOW (ref 135–145)
Total Bilirubin: 0.4 mg/dL (ref 0.3–1.2)
Total Protein: 5.3 g/dL — ABNORMAL LOW (ref 6.5–8.1)

## 2021-09-22 LAB — MAGNESIUM: Magnesium: 1.6 mg/dL — ABNORMAL LOW (ref 1.7–2.4)

## 2021-09-22 LAB — PROCALCITONIN: Procalcitonin: <0.1 ng/mL

## 2021-09-22 LAB — BRAIN NATRIURETIC PEPTIDE: B Natriuretic Peptide: 31.7 pg/mL (ref 0.0–100.0)

## 2021-09-22 LAB — C-REACTIVE PROTEIN: CRP: 1.2 mg/dL — ABNORMAL HIGH (ref <1.0)

## 2021-09-22 MED ORDER — SODIUM CHLORIDE 0.9 % IV SOLN: INTRAVENOUS | Status: DC | PRN

## 2021-09-22 MED ORDER — MAGNESIUM SULFATE 4 GM/100ML IV SOLN
4.0000 g | Freq: Once | INTRAVENOUS | Status: AC
  Administered 2021-09-22: 4 g via INTRAVENOUS
  Filled 2021-09-22: qty 100

## 2021-09-22 NOTE — Progress Notes (Addendum)
Patient has refused the following medications:  ? ?Pro[employer] Liquid ?Bethanecol ?Bisacodyl ?Colace  ?Folic Acid ?SQ Heparin ?Lidocaine patch ?Thiamine.  ? ?Med administration attempted by 2 Rns ?Patient is agreeable to take oxycodone, klonopin and gabapentin.  ? ?Dr. Ronnie Derby notified. ?

## 2021-09-22 NOTE — Progress Notes (Signed)
PROGRESS NOTE                                                                                                                                                                                                             Patient Demographics:    Lance House, is a 71 y.o. male, DOB - 1951-07-10, QIO:[id number]  Outpatient Primary MD for the patient is Lance House    LOS - 12  Admit date - 09/10/2021    No chief complaint on file.      Brief Narrative (HPI from H&P)   -  71 year old gentleman who lives in [place] with history of smoking, alcohol abuse recent admission for traumatic intracranial hemorrhage after a fall, he was admitted on 08/15/2021 and discharged on 08/24/2021 for this issue.  He went to [facility] but was sent back to [facility] ER within a few hours because of various complaints.  Due to lack of bed availability he was in the ER for over a week, subsequently developed some chest pain and work-up showed left-sided pleural effusion/empyema and he was admitted to [facility] by critical care team on 09/10/2021.  He was seen by cardiothoracic surgery underwent VATS procedure along with chest tube placement on 09/11/2021.  His chest tubes were removed by cardiothoracic surgery on 09/17/2021, he was stabilized and transferred to my care on 09/18/2021 on day 8 of his  hospital stay.   Subjective:   Patient sitting in bed in no discomfort whatsoever just walked back from bathroom, is overall quite weak and deconditioned, denies any headache, still complaining of some chest discomfort, no focal weakness.   Assessment  & Plan :     Septic Shock due to left-sided pneumonia and empyema - seen by PCCM and cardiothoracic surgery underwent VATS procedure with chest tube placement on 09/11/2021, sepsis pathophysiology has resolved, pleural fluid growing strep intermedius.  Currently on  Rocephin.  Chest tube removed on 09/17/2021.  Continue supportive care.  Encouraged to sit in chair use I-S for pulmonary toiletry.  Advance activity.  Monitor clinically.  Repeat chest x-ray on 09/20/2021 as he complains of 10 out of 10 pain.  2.  Acute hypoxic respiratory failure.  Due to #1 above.  Resolved.  3.  HX of narcotic use, alcohol and tobacco abuse.  Counseled to smoking  and alcohol and taper off narcotics gradually but he does not seem to be motivated.  He uses narcotics at home for chronic back pain.  I think his pneumonia and empyema could have been due to chronic microaspiration from being under the effect of alcohol and narcotics.  4.  Recent [facility] admission after a fall due to alcohol abuse.  PT OT advance activity may require SNF.  5.  Mild PCM.  Protein supplements.  6.  Unstageable decubitus ulcer.  Unclear onset, wound care following.  7.  History of gout.  On allopurinol.   8.  Chronic pain.  Minimize narcotic and benzo use as much as possible.   Noncompliance with medications in the hospital.  Besides narcotics, Neurontin and his IV antibiotic patient is refusing multiple medications according to the nursing staff.  Extensively counseled.  Warned him that this could result in disability and death .  He is medically ready for discharge initially he wanted to go home with home health but now wants to go to SNF.       Condition - Fair  Family Communication  :  None present  Code Status :  Full  Consults  :  PCCM, TCTS  PUD Prophylaxis :    Procedures  :     L. Lung VATS - Chest tube placement by TCTS - chest tube removed 09/17/2021.      Disposition Plan  :    Status is: Inpatient  DVT Prophylaxis  :    heparin injection 5,000 Units Start: 09/10/21 2200 SCDs Start: 09/10/21 1815   Lab Results  Component Value Date   PLT 502 (H) 09/22/2021    Diet :  Diet Order             Diet - low sodium heart healthy           Diet regular Room service  appropriate? Yes; Fluid consistency: Thin  Diet effective now                    Inpatient Medications  Scheduled Meds:  (feeding supplement) PROSource Plus  30 mL Oral BID BM   allopurinol  300 mg Oral Daily   alum & mag hydroxide-simeth  30 mL Oral Once   bethanechol  5 mg Oral TID   bisacodyl  10 mg Oral Daily   Chlorhexidine Gluconate Cloth  6 each Topical Daily   docusate sodium  100 mg Oral BID   folic acid  1 mg Oral Daily   furosemide  40 mg Intravenous Once   gabapentin  400 mg Oral TID   heparin  5,000 Units Subcutaneous Q8H   lactose free nutrition  237 mL Oral TID WC   lidocaine  1 patch Transdermal Q24H   mouth rinse  15 mL Mouth Rinse BID   multivitamin with minerals  1 tablet Oral Daily   senna-docusate  1 tablet Oral QHS   thiamine  100 mg Oral Daily   Continuous Infusions:  sodium chloride 50 mL/hr at 09/14/21 1800   cefTRIAXone (ROCEPHIN)  IV 2 g (09/21/21 1232)   PRN Meds:.albuterol, clonazePAM, guaiFENesin-dextromethorphan, ipratropium-albuterol, ondansetron (ZOFRAN) IV, oxyCODONE    Time Spent in minutes  30   Lala Lund M.D on 09/22/2021 at 11:52 AM  To page go to [email or web address]   [facility] -  Office  [phone]  See all Orders from today for further details    Objective:   Vitals:   09/22/21 0426 09/22/21 0700  09/22/21 0734 09/22/21 1145  BP:  124/80 127/79 117/75  Pulse:  (!) 106 97 98  Resp:   17 20  Temp:  98 F (36.7 C) 97.8 F (36.6 C) 97.7 F (36.5 C)  TempSrc:  Oral Oral Oral  SpO2:   96% 91%  Weight: 78.1 kg     Height:        Wt Readings from Last 3 Encounters:  09/22/21 78.1 kg  09/10/21 81.6 kg  08/24/21 81.6 kg     Intake/Output Summary (Last 24 hours) at 09/22/2021 1152 Last data filed at 09/22/2021 1146 Gross per 24 hour  Intake 100 ml  Output 4450 ml  Net -4350 ml     Physical Exam  Awake Alert, No new F.N deficits, in absolutely no distress whatsoever and having  breakfast [place].AT,PERRAL Supple Neck, No JVD,   Symmetrical Chest wall movement, Good air movement bilaterally, CTAB RRR,No Gallops, Rubs or new Murmurs,  +ve B.Sounds, Abd Soft, No tenderness,   No Cyanosis, Clubbing or edema      RN pressure injury documentation: Pressure Injury 09/10/21 Coccyx Right;Lower Stage 2 -  Partial thickness loss of dermis presenting as a shallow open injury with a red, pink wound bed without slough. (Active)  09/10/21 2000  Location: Coccyx  Location Orientation: Right;Lower  Staging: Stage 2 -  Partial thickness loss of dermis presenting as a shallow open injury with a red, pink wound bed without slough.  Wound Description (Comments):   Present on Admission: Yes     Data Review:    CBC Recent Labs  Lab 09/16/21 0122 09/18/21 0619 09/19/21 0515 09/20/21 0251 09/21/21 0447 09/22/21 0145  WBC 9.6 8.8 11.3* 12.0* 12.6* 11.9*  HGB 11.7* 12.0* 11.4* 11.9* 11.6* 11.3*  HCT 35.7* 35.6* 34.8* 35.6* 35.1* 33.6*  PLT 349 434* 498* 539* 548* 502*  MCV 93.7 93.2 93.0 94.2 94.4 93.9  MCH 30.7 31.4 30.5 31.5 31.2 31.6  MCHC 32.8 33.7 32.8 33.4 33.0 33.6  RDW 14.0 14.4 14.6 14.9 15.1 15.1  LYMPHSABS 1.8  --  1.5 2.1 1.9 2.1  MONOABS 0.6  --  0.8 0.9 1.0 1.0  EOSABS 0.4  --  0.2 0.3 0.3 0.3  BASOSABS 0.1  --  0.1 0.0 0.0 0.1    Electrolytes Recent Labs  Lab 09/18/21 0619 09/19/21 0515 09/20/21 0251 09/21/21 0447 09/22/21 0145  NA 135 136 136 134* 133*  K 4.0 4.0 4.4 4.5 3.8  CL 98 98 98 98 98  CO2 31 30 31 31 27   GLUCOSE 132* 113* 87 102* 132*  BUN <5* <5* <5* <5* 5*  CREATININE 0.49* 0.41* 0.45* 0.52* 0.44*  CALCIUM 8.0* 8.1* 8.4* 8.3* 8.4*  AST 21 19 24 20 20   ALT 14 12 15 16 16   ALKPHOS 63 56 62 63 66  BILITOT 0.3 0.3 0.4 0.2* 0.4  ALBUMIN 1.6* 1.8* 1.9* 2.0* 1.9*  MG 1.7 1.7 1.6* 1.8 1.6*  CRP 0.7 0.6 0.7 0.9 1.2*  PROCALCITON <0.10 <0.10 <0.10 <0.10 <0.10  BNP 76.8 39.6 53.0 28.3 31.7      Radiology Reports DG Chest Port 1  View  Result Date: 09/20/2021 CLINICAL DATA:  Intermittent shortness of breath. Two left-sided chest tubes removed over the past 3-4 days. EXAM: PORTABLE CHEST 1 VIEW COMPARISON:  09/18/2021, 09/17/2021, 09/16/2021 FINDINGS: Cardiac silhouette and mediastinal contours are within normal limits. The prior lateral left chest wall subcutaneous air is not visualized on the current study. Cardiac silhouette and mediastinal contours  are within normal limits. Tiny left apical pneumothorax appears slightly decreased in size measuring approximally 5 mm in craniocaudal dimension compared to 10 mm on 09/18/2021. Mild left pleural effusion with left basilar heterogeneous and linear opacities, similar to prior. No right pleural effusion. Superior lucent emphysematous changes and chronic superior right lung reticular opacity and interstitial thickening unchanged. No acute skeletal abnormality. IMPRESSION:: IMPRESSION: 1. Mild interval decrease in minimal left apical pneumothorax. 2. Unchanged small left pleural effusion with left basilar heterogeneous opacity, atelectasis versus pneumonia. 3. Chronic superior right upper lung interstitial thickening and reticular opacities. Electronically Signed   By: Yvonne Kendall M.D.   On: 09/20/2021 11:38

## 2021-09-22 NOTE — Progress Notes (Signed)
PT Cancellation Note ? ?Patient Details ?Name: Lance House ?MRN: [id number] ?DOB: [birth year] ? ? ?Cancelled Treatment:    Reason Eval/Treat Not Completed: Fatigue/lethargy limiting ability to participate;Pain limiting ability to participate. Pt declining PT x 2 attempts. Reporting generalized pain, greatest in BLE, and generalized fatigue. Encouragement provided but pt continued to decline.  ? ? ?Lorriane Shire ?09/22/2021, 11:52 AM ? ?Lorrin Goodell, PT  ?Office # [phone] ?Pager [phone] ? ?

## 2021-09-22 NOTE — TOC Progression Note (Signed)
Transition of Care (TOC) - Progression Note  ? ? ?Patient Details  ?Name: Sumedh Shinsato ?MRN: [id number] ?Date of Birth: 05/22/1951 ? ?Transition of Care (TOC) CM/SW Contact  ?Benard Halsted, LCSW ?Phone Number: ?09/22/2021, 3:31 PM ? ?Clinical Narrative:    ?Per SNF, [employer] requesting updated therapy notes. CSW contacted [facility] [phone]) and obtained fax number (f. [phone]). CSW faxed requested clinicals.  ? ? ?Expected Discharge Plan: [place] ?Barriers to Discharge: Insurance Authorization ? ?Expected Discharge Plan and Services ?Expected Discharge Plan: [place] ?In-house Referral: Clinical Social Work ?Discharge Planning Services: CM Consult ?Post Acute Care Choice: Home Health ?Living arrangements for the past 2 months: Mobile Home ?Expected Discharge Date: 09/21/21               ?  ?  ?  ?  ?  ?  ?  ?  ?  ?  ? ? ?Social Determinants of Health (SDOH) Interventions ?  ? ?Readmission Risk Interventions ?No flowsheet data found. ? ?

## 2021-09-22 NOTE — Progress Notes (Addendum)
? ?                 [address] ?           [place] [address] ?         [phone]  ? ?11 Days Post-Op ?Procedure(s) (LRB): ?VIDEO ASSISTED THORACOSCOPY (VATS)/DECORTICATION (Left) ? ?Total Length of Stay:  LOS: 12 days  ? ?Subjective: ?Patient asking for Klonopin this am. He states he hopes insurance approves him to go to facility soon. ? ?Objective: ?Vital signs in last 24 hours: ?Temp:  [97.9 ?F (36.6 ?C)-98.5 ?F (36.9 ?C)] 98 ?F (36.7 ?C) (03/03 0700) ?Pulse Rate:  [99-111] 106 (03/03 0700) ?Cardiac Rhythm: Sinus tachycardia (03/02 1948) ?Resp:  [10-22] 20 (03/03 0325) ?BP: (123-148)/(73-82) 124/80 (03/03 0700) ?SpO2:  [87 %-100 %] 96 % (03/03 0325) ?Weight:  [78.1 kg] 78.1 kg (03/03 0426) ? ?Filed Weights  ? 09/20/21 0500 09/20/21 1208 09/22/21 0426  ?Weight: 80.5 kg 80.5 kg 78.1 kg  ? ? ?Weight change: -2.4 kg  ? ?  ? ?Intake/Output from previous day: ?03/02 0701 - 03/03 0700 ?In: 220 [P.O.:120; IV Piggyback:100] ?Out: 3100 [Urine:3100] ? ?Intake/Output this shift: ?No intake/output data recorded. ? ?Current Meds: ?Scheduled Meds: ? (feeding supplement) PROSource Plus  30 mL Oral BID BM  ? allopurinol  300 mg Oral Daily  ? alum & mag hydroxide-simeth  30 mL Oral Once  ? bethanechol  5 mg Oral TID  ? bisacodyl  10 mg Oral Daily  ? Chlorhexidine Gluconate Cloth  6 each Topical Daily  ? docusate sodium  100 mg Oral BID  ? folic acid  1 mg Oral Daily  ? furosemide  40 mg Intravenous Once  ? gabapentin  400 mg Oral TID  ? heparin  5,000 Units Subcutaneous Q8H  ? lactose free nutrition  237 mL Oral TID WC  ? lidocaine  1 patch Transdermal Q24H  ? mouth rinse  15 mL Mouth Rinse BID  ? multivitamin with minerals  1 tablet Oral Daily  ? senna-docusate  1 tablet Oral QHS  ? thiamine  100 mg Oral Daily  ? ? ?Heart: Slightly tachy ?Lungs: Clear on right and slightly diminished left basilar breath sounds ?Abdomen: Soft, non tender, bowel sounds ?Extremities: SCDs ?Wound: Chest tube wounds open (no  sutures). Wounds with slight serous drainage. Surgical wound is  clean, dry and no sight of infection ? ?Lab Results: ?CBC: ?Recent Labs  ?  09/21/21 ?0938 09/22/21 ?0145  ?WBC 12.6* 11.9*  ?HGB 11.6* 11.3*  ?HCT 35.1* 33.6*  ?PLT 548* 502*  ? ? ?BMET:  ?Recent Labs  ?  09/21/21 ?1829 09/22/21 ?0145  ?NA 134* 133*  ?K 4.5 3.8  ?CL 98 98  ?CO2 31 27  ?GLUCOSE 102* 132*  ?BUN <5* 5*  ?CREATININE 0.52* 0.44*  ?CALCIUM 8.3* 8.4*  ? ?  ?CMET: ?Lab Results  ?Component Value Date  ? WBC 11.9 (H) 09/22/2021  ? HGB 11.3 (L) 09/22/2021  ? HCT 33.6 (L) 09/22/2021  ? PLT 502 (H) 09/22/2021  ? GLUCOSE 132 (H) 09/22/2021  ? TRIG 51 09/12/2021  ? ALT 16 09/22/2021  ? AST 20 09/22/2021  ? NA 133 (L) 09/22/2021  ? K 3.8 09/22/2021  ? CL 98 09/22/2021  ? CREATININE 0.44 (L) 09/22/2021  ? BUN 5 (L) 09/22/2021  ? CO2 27 09/22/2021  ? TSH 1.319 08/15/2021  ? INR 1.4 (H) 09/10/2021  ? ? ? ? ?PT/INR:  ?No results for  input(s): LABPROT, INR in the last 72 hours. ? ?Radiology: No results found. ? ? ?Assessment/Plan: ?S/P Procedure(s) (LRB): ?VIDEO ASSISTED THORACOSCOPY (VATS)/DECORTICATION (Left) ?CV-Slightly tachy this am. ?Pulmonary-On room air. Encourage incentive spirometer.  ?ID-WBC this am 11,900. On Ceftriaxone. Culture left pleural peel shows rare Strep Intermedius ?4. History of alcohol abuse-CIWA protocol ?5. On Heparin 5000 units tid for DVT prophylaxis ?6. Regarding pain control, has Lidocaine patch, Oxy IR 10 mg Q 6 PRN, and Gabapentin 400 mg tid. ?7. Hyponatremia-sodium slightly decreased to 133 ?8. Deconditioned-continue PT/OT ?9. Follow up appointment has been arranged ? ?Donielle Liston Alba PA-C ?09/22/2021 7:31 AM  ? ?Patient seen and examined, agree with above. ?Pain control remains an issue. ?Wounds OK ? ?Revonda Standard Roxan Hockey, MD ?[facility] ?[phone] ? ? ? ? ?

## 2021-09-23 LAB — MAGNESIUM: Magnesium: 1.9 mg/dL (ref 1.7–2.4)

## 2021-09-23 NOTE — Progress Notes (Addendum)
PROGRESS NOTE                                                                                                                                                                                                             Patient Demographics:    Lance House, is a 71 y.o. male, DOB - 12-18-1950, DGU:[id number]  Outpatient Primary MD for the patient is Spencer date - 09/10/2021    No chief complaint on file.      Brief Narrative (HPI from H&P)   -  71 year old gentleman who lives in [place] with history of smoking, alcohol abuse recent admission for traumatic intracranial hemorrhage after a fall, he was admitted on 08/15/2021 and discharged on 08/24/2021 for this issue.  He went to [facility] but was sent back to [facility] ER within a few hours because of various complaints.  Due to lack of bed availability he was in the ER for over a week, subsequently developed some chest pain and work-up showed left-sided pleural effusion/empyema and he was admitted to [facility] by critical care team on 09/10/2021.  He was seen by cardiothoracic surgery underwent VATS procedure along with chest tube placement on 09/11/2021.  His chest tubes were removed by cardiothoracic surgery on 09/17/2021, he was stabilized and transferred to my care on 09/18/2021 on day 8 of his  hospital stay.   Subjective:   Patient in bed sleeping, easily arousable, headache, still complains of some left-sided chest pain at the site of chest tube and wants some pain medications if he can, no abdominal pain no focal weakness   Assessment  & Plan :     Septic Shock due to left-sided pneumonia and empyema - seen by PCCM and cardiothoracic surgery underwent VATS procedure with chest tube placement on 09/11/2021, sepsis pathophysiology has resolved, pleural fluid growing strep intermedius.  Currently on Rocephin  total duration of antibiotics 3 weeks.  Chest tube removed on 09/17/2021.  Continue supportive care.  Encouraged to sit in chair use I-S for pulmonary toiletry.  Advance activity.  Monitor clinically.  Repeat chest x-ray on 09/20/2021 as he complains of 10 out of 10 pain.  2.  Acute hypoxic respiratory failure.  Due to #1 above.  Resolved.  3.  HX of narcotic  use, alcohol and tobacco abuse.  Counseled to smoking and alcohol and taper off narcotics gradually but he does not seem to be motivated.  He uses narcotics at home for chronic back pain.  I think his pneumonia and empyema could have been due to chronic microaspiration from being under the effect of alcohol and narcotics.  4.  Recent [facility] admission after a fall due to alcohol abuse.  PT OT advance activity may require SNF.  5.  Mild PCM.  Protein supplements.  6.  Unstageable decubitus ulcer.  Unclear onset, wound care following.  7.  History of gout.  On allopurinol.   8.  Chronic pain.  Minimize narcotic and benzo use as much as possible.  He is exhibiting signs of narcotic seeking behavior when he is seeking more narcotics while appearing to be in no distress at times eating breakfast, on 09/23/2021 he was woken up from sleep and he asked for narcotics.   Noncompliance with medications in the hospital.  Besides narcotics, Neurontin and his IV antibiotic patient is refusing multiple medications according to the nursing staff.  Extensively counseled.  Warned him that this could result in disability and death .  He is medically ready for discharge initially he wanted to go home with home health but now wants to go to SNF.       Condition - Fair  Family Communication  :  None present  Code Status :  Full  Consults  :  PCCM, TCTS  PUD Prophylaxis :    Procedures  :     L. Lung VATS - Chest tube placement by TCTS - chest tube removed 09/17/2021.      Disposition Plan  :    Status is: Inpatient  DVT Prophylaxis  :    heparin  injection 5,000 Units Start: 09/10/21 2200 SCDs Start: 09/10/21 1815   Lab Results  Component Value Date   PLT 502 (H) 09/22/2021    Diet :  Diet Order             Diet - low sodium heart healthy           Diet regular Room service appropriate? Yes; Fluid consistency: Thin  Diet effective now                    Inpatient Medications  Scheduled Meds:  (feeding supplement) PROSource Plus  30 mL Oral BID BM   allopurinol  300 mg Oral Daily   alum & mag hydroxide-simeth  30 mL Oral Once   bethanechol  5 mg Oral TID   bisacodyl  10 mg Oral Daily   docusate sodium  100 mg Oral BID   folic acid  1 mg Oral Daily   gabapentin  400 mg Oral TID   heparin  5,000 Units Subcutaneous Q8H   lactose free nutrition  237 mL Oral TID WC   lidocaine  1 patch Transdermal Q24H   mouth rinse  15 mL Mouth Rinse BID   multivitamin with minerals  1 tablet Oral Daily   senna-docusate  1 tablet Oral QHS   thiamine  100 mg Oral Daily   Continuous Infusions:  sodium chloride     cefTRIAXone (ROCEPHIN)  IV Stopped (09/22/21 1325)   PRN Meds:.sodium chloride, albuterol, clonazePAM, guaiFENesin-dextromethorphan, ipratropium-albuterol, ondansetron (ZOFRAN) IV, oxyCODONE    Time Spent in minutes  30   Lala Lund M.D on 09/23/2021 at 10:50 AM  To page go to [email or web address]   [facility] -  Office  [phone]  See all Orders from today for further details    Objective:   Vitals:   09/22/21 2000 09/23/21 0000 09/23/21 0349 09/23/21 0800  BP: 127/76 94/64 112/79 116/79  Pulse: (!) 107   100  Resp: '19 16 17 19  '$ Temp: 98.2 F (36.8 C) 98.6 F (37 C) 98.6 F (37 C) 97.7 F (36.5 C)  TempSrc: Oral Oral Oral Oral  SpO2: 91%     Weight:   77 kg   Height:        Wt Readings from Last 3 Encounters:  09/23/21 77 kg  09/10/21 81.6 kg  08/24/21 81.6 kg     Intake/Output Summary (Last 24 hours) at 09/23/2021 1050 Last data filed at 09/23/2021 0916 Gross per 24 hour  Intake  340 ml  Output 2850 ml  Net -2510 ml     Physical Exam    Awake Alert, No new F.N deficits, in no distress and sleeping [place].AT,PERRAL Supple Neck, No JVD,   Symmetrical Chest wall movement, Good air movement bilaterally, CTAB RRR,No Gallops, Rubs or new Murmurs,  +ve B.Sounds, Abd Soft, No tenderness,   No Cyanosis, Clubbing or edema       RN pressure injury documentation: Pressure Injury 09/10/21 Coccyx Right;Lower Stage 2 -  Partial thickness loss of dermis presenting as a shallow open injury with a red, pink wound bed without slough. (Active)  09/10/21 2000  Location: Coccyx  Location Orientation: Right;Lower  Staging: Stage 2 -  Partial thickness loss of dermis presenting as a shallow open injury with a red, pink wound bed without slough.  Wound Description (Comments):   Present on Admission: Yes     Data Review:    CBC Recent Labs  Lab 09/18/21 0619 09/19/21 0515 09/20/21 0251 09/21/21 0447 09/22/21 0145  WBC 8.8 11.3* 12.0* 12.6* 11.9*  HGB 12.0* 11.4* 11.9* 11.6* 11.3*  HCT 35.6* 34.8* 35.6* 35.1* 33.6*  PLT 434* 498* 539* 548* 502*  MCV 93.2 93.0 94.2 94.4 93.9  MCH 31.4 30.5 31.5 31.2 31.6  MCHC 33.7 32.8 33.4 33.0 33.6  RDW 14.4 14.6 14.9 15.1 15.1  LYMPHSABS  --  1.5 2.1 1.9 2.1  MONOABS  --  0.8 0.9 1.0 1.0  EOSABS  --  0.2 0.3 0.3 0.3  BASOSABS  --  0.1 0.0 0.0 0.1    Electrolytes Recent Labs  Lab 09/18/21 0619 09/19/21 0515 09/20/21 0251 09/21/21 0447 09/22/21 0145 09/23/21 0107  NA 135 136 136 134* 133*  --   K 4.0 4.0 4.4 4.5 3.8  --   CL 98 98 98 98 98  --   CO2 '31 30 31 31 27  '$ --   GLUCOSE 132* 113* 87 102* 132*  --   BUN <5* <5* <5* <5* 5*  --   CREATININE 0.49* 0.41* 0.45* 0.52* 0.44*  --   CALCIUM 8.0* 8.1* 8.4* 8.3* 8.4*  --   AST '21 19 24 20 20  '$ --   ALT '14 12 15 16 16  '$ --   ALKPHOS 63 56 62 63 66  --   BILITOT 0.3 0.3 0.4 0.2* 0.4  --   ALBUMIN 1.6* 1.8* 1.9* 2.0* 1.9*  --   MG 1.7 1.7 1.6* 1.8 1.6* 1.9  CRP 0.7 0.6  0.7 0.9 1.2*  --   PROCALCITON <0.10 <0.10 <0.10 <0.10 <0.10  --   BNP 76.8 39.6 53.0 28.3 31.7  --  Radiology Reports DG Chest Port 1 View  Result Date: 09/20/2021 CLINICAL DATA:  Intermittent shortness of breath. Two left-sided chest tubes removed over the past 3-4 days. EXAM: PORTABLE CHEST 1 VIEW COMPARISON:  09/18/2021, 09/17/2021, 09/16/2021 FINDINGS: Cardiac silhouette and mediastinal contours are within normal limits. The prior lateral left chest wall subcutaneous air is not visualized on the current study. Cardiac silhouette and mediastinal contours are within normal limits. Tiny left apical pneumothorax appears slightly decreased in size measuring approximally 5 mm in craniocaudal dimension compared to 10 mm on 09/18/2021. Mild left pleural effusion with left basilar heterogeneous and linear opacities, similar to prior. No right pleural effusion. Superior lucent emphysematous changes and chronic superior right lung reticular opacity and interstitial thickening unchanged. No acute skeletal abnormality. IMPRESSION:: IMPRESSION: 1. Mild interval decrease in minimal left apical pneumothorax. 2. Unchanged small left pleural effusion with left basilar heterogeneous opacity, atelectasis versus pneumonia. 3. Chronic superior right upper lung interstitial thickening and reticular opacities. Electronically Signed   By: Yvonne Kendall M.D.   On: 09/20/2021 11:38

## 2021-09-24 ENCOUNTER — Inpatient Hospital Stay (HOSPITAL_COMMUNITY): Payer: Medicare HMO

## 2021-09-24 IMAGING — CR DG CHEST 2V
2 series · 2 of 2 positions shown · non-contrast
Comparison: [DATE]

CLINICAL DATA: Empyema

EXAM:
CHEST - 2 VIEW

[chest lat]
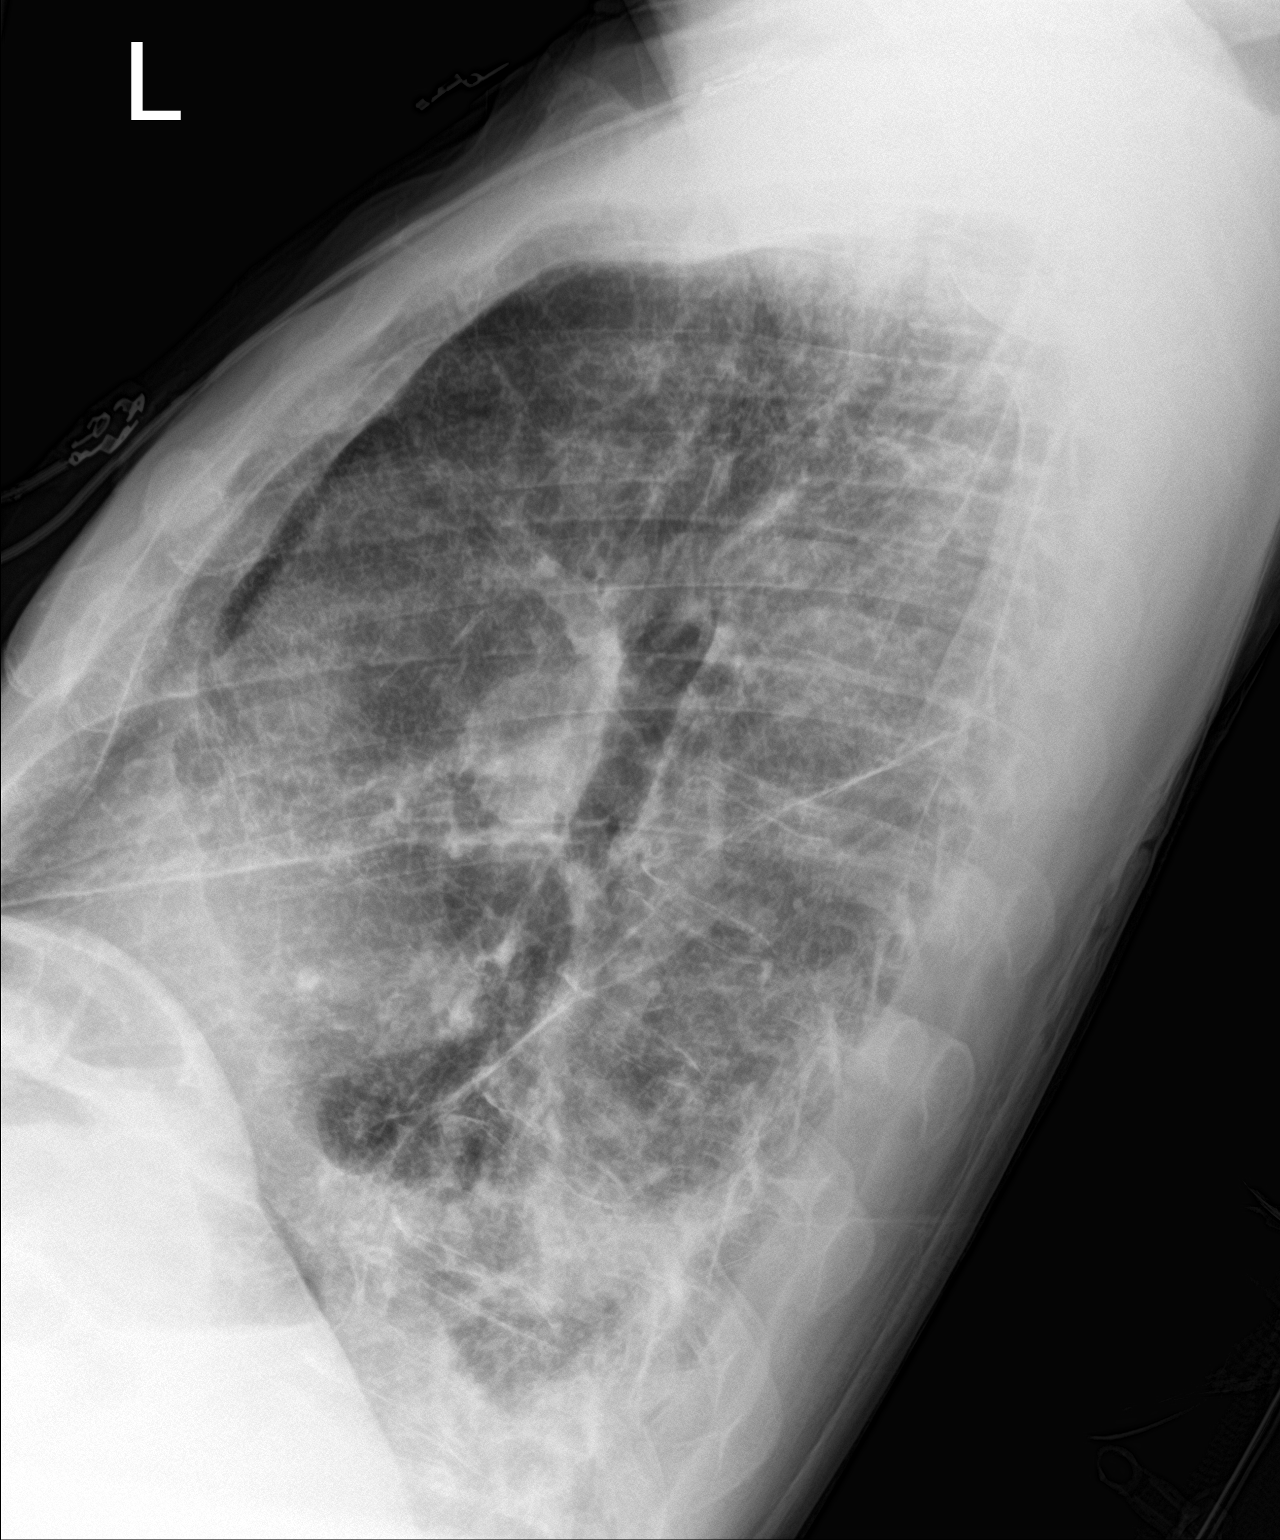

[chest ap]
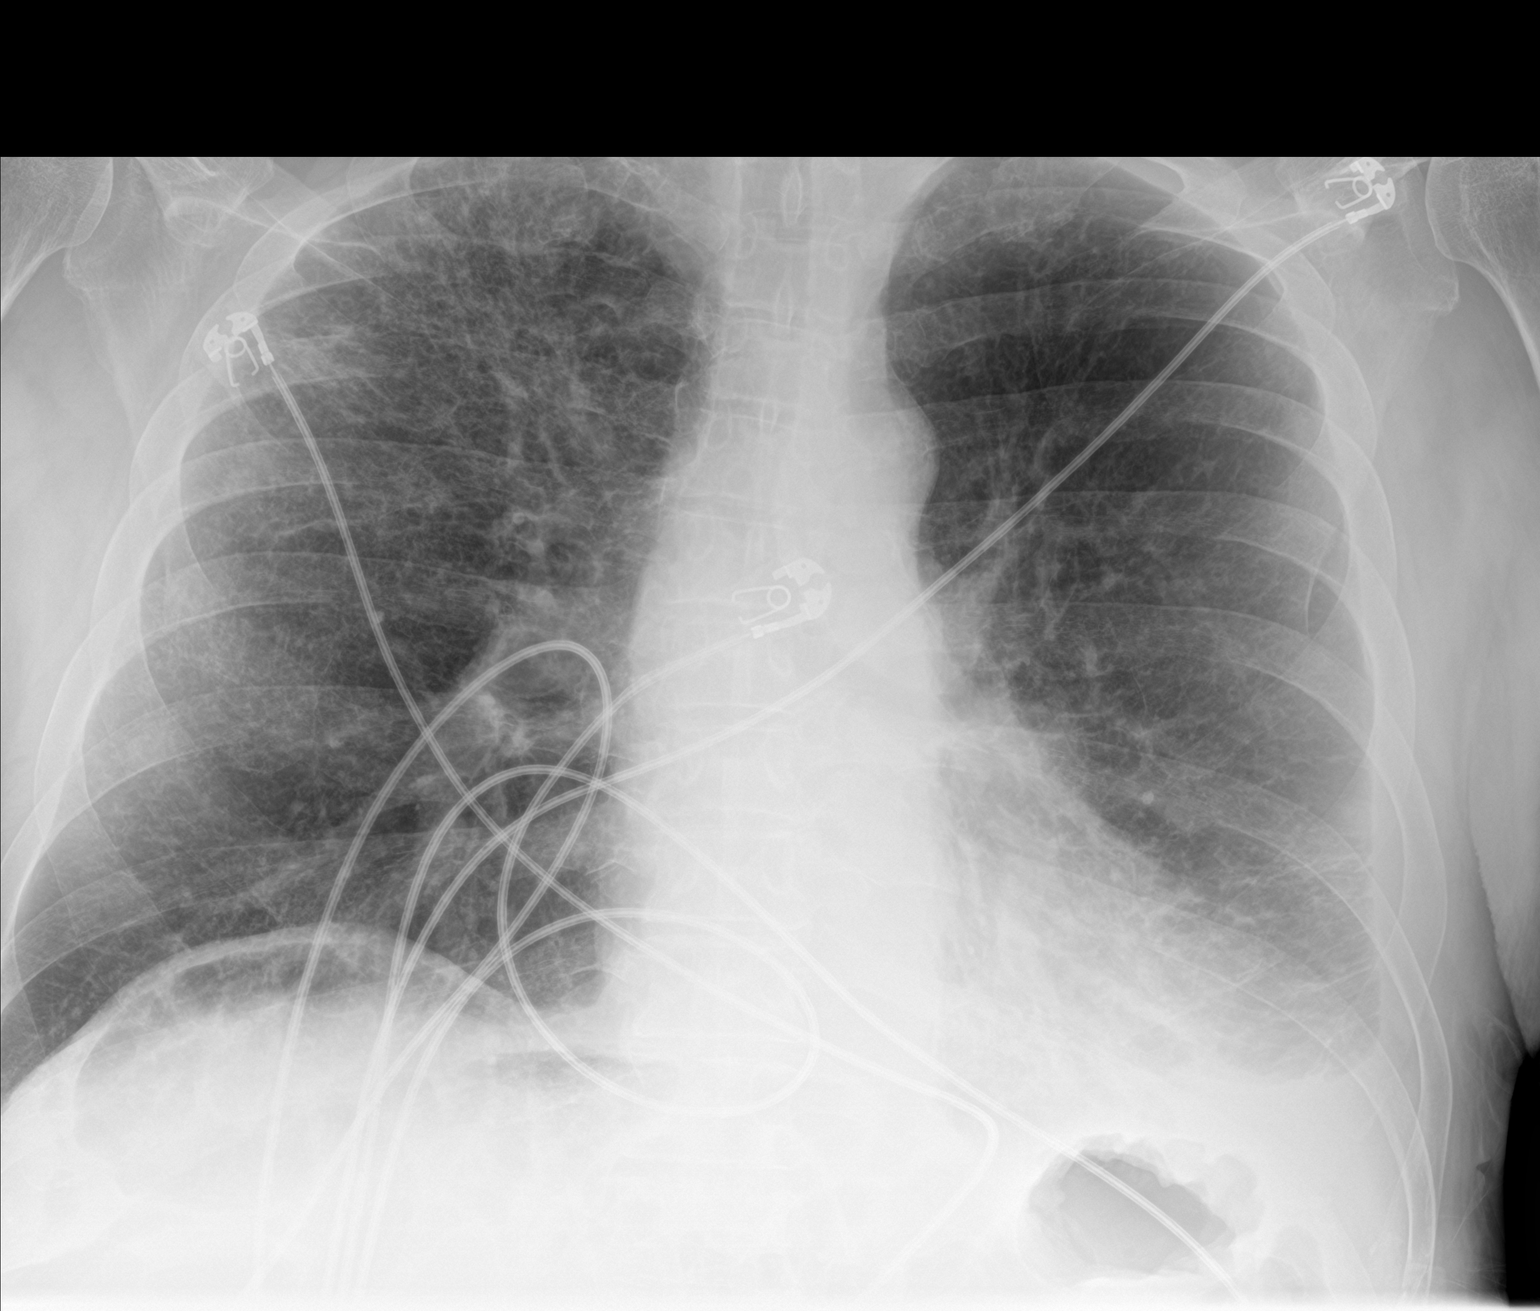

[2 of 2 positions shown; findings below may reference images not displayed]

FINDINGS: Persistent small unchanged left pleural effusion compared with
[DATE]. Left basilar airspace disease. Tiny left apical
pneumothorax unchanged from the prior exam. Bilateral emphysematous
changes. Chronic interstitial thickening bilaterally most severe in
the right upper lobe. Stable cardiomediastinal silhouette. No
aggressive osseous lesion.
IMPRESSION: 1. Persistent small unchanged left pleural effusion compared with
[DATE]. Tiny left apical pneumothorax unchanged from the prior
exam.

## 2021-09-24 NOTE — Progress Notes (Signed)
PROGRESS NOTE                                                                                                                                                                                                             Patient Demographics:    Lance House, is a 71 y.o. male, DOB - 1950-10-25, IZT:[id number]  [facility]    LOS - 14  Admit date - 09/10/2021    No chief complaint on file.      Brief Narrative (HPI from H&P)   -  71 year old gentleman who lives in [place] with history of smoking, alcohol abuse recent admission for traumatic intracranial hemorrhage after a fall, he was admitted on 08/15/2021 and discharged on 08/24/2021 for this issue.  He went to [facility] but was sent back to [facility] ER within a few hours because of various complaints.  Due to lack of bed availability he was in the ER for over a week, subsequently developed some chest pain and work-up showed left-sided pleural effusion/empyema and he was admitted to [facility] by critical care team on 09/10/2021.  He was seen by cardiothoracic surgery underwent VATS procedure along with chest tube placement on 09/11/2021.  His chest tubes were removed by cardiothoracic surgery on 09/17/2021, he was stabilized and transferred to my care on 09/18/2021 on day 8 of his  hospital stay.  He is medically ready for discharge initially he wanted to go home with home health but now wants to go to SNF.   Subjective:   Sitting up in bed watching television in no distress, denies any headache but still complaining of 10 out of 10 pleuritic left-sided chest pain and still wondering if he can get more narcotics, no cough, no focal weakness.   Assessment  & Plan :     Septic Shock due to left-sided pneumonia and empyema - seen by PCCM and cardiothoracic surgery underwent VATS procedure with chest  tube placement on 09/11/2021, sepsis pathophysiology has resolved, pleural fluid growing strep intermedius.  Currently on Rocephin total duration of antibiotics 3 weeks.  Chest tube removed on 09/17/2021.  Continue supportive care.  Encouraged to sit in chair use I-S for pulmonary toiletry.  Advance activity.  Monitor clinically.  Chest x-ray on 09/24/2021 remains unchanged and  stable.  Of note patient appears to be in no distress but continues to demand high doses of narcotics, clearly exhibiting narcotic seeking behavior.  2.  Acute hypoxic respiratory failure.  Due to #1 above.  Resolved.  3.  HX of narcotic use, alcohol and tobacco abuse.  Counseled to smoking and alcohol and taper off narcotics gradually but he does not seem to be motivated.  He uses narcotics at home for chronic back pain.  I think his pneumonia and empyema could have been due to chronic microaspiration from being under the effect of alcohol and narcotics.  4.  Recent [facility] admission after a fall due to alcohol abuse.  PT OT advance activity may require SNF.  5.  Mild PCM.  Protein supplements.  6.  Unstageable decubitus ulcer.  Unclear onset, wound care following.  7.  History of gout.  On allopurinol.   8.  Chronic pain.  Minimize narcotic and benzo use as much as possible.  He is exhibiting signs of narcotic seeking behavior when he is seeking more narcotics while appearing to be in no distress at times eating breakfast, on 09/23/2021 he was woken up from sleep and he asked for narcotics.   Noncompliance with medications in the hospital.  Besides narcotics, Neurontin and his IV antibiotic patient is refusing multiple medications according to the nursing staff.  Extensively counseled.  Warned him that this could result in disability and death .       Condition - Fair  Family Communication  :  None present  Code Status :  Full  Consults  :  PCCM, TCTS  PUD Prophylaxis :    Procedures  :     L. Lung VATS - Chest tube  placement by TCTS - chest tube removed 09/17/2021.      Disposition Plan  :    Status is: Inpatient  DVT Prophylaxis  :    heparin injection 5,000 Units Start: 09/10/21 2200 SCDs Start: 09/10/21 1815   Lab Results  Component Value Date   PLT 502 (H) 09/22/2021    Diet :  Diet Order             Diet - low sodium heart healthy           Diet regular Room service appropriate? Yes; Fluid consistency: Thin  Diet effective now                    Inpatient Medications  Scheduled Meds:  (feeding supplement) PROSource Plus  30 mL Oral BID BM   allopurinol  300 mg Oral Daily   alum & mag hydroxide-simeth  30 mL Oral Once   bethanechol  5 mg Oral TID   bisacodyl  10 mg Oral Daily   docusate sodium  100 mg Oral BID   folic acid  1 mg Oral Daily   gabapentin  400 mg Oral TID   heparin  5,000 Units Subcutaneous Q8H   lactose free nutrition  237 mL Oral TID WC   lidocaine  1 patch Transdermal Q24H   mouth rinse  15 mL Mouth Rinse BID   multivitamin with minerals  1 tablet Oral Daily   senna-docusate  1 tablet Oral QHS   thiamine  100 mg Oral Daily   Continuous Infusions:  sodium chloride     cefTRIAXone (ROCEPHIN)  IV 2 g (09/23/21 1139)   PRN Meds:.sodium chloride, albuterol, clonazePAM, guaiFENesin-dextromethorphan, ipratropium-albuterol, ondansetron (ZOFRAN) IV, oxyCODONE    Time Spent in  minutes  30   Lala Lund M.D on 09/24/2021 at 9:56 AM  To page go to [email or web address]   [facility] -  Office  [phone]  See all Orders from today for further details    Objective:   Vitals:   09/23/21 2000 09/23/21 2342 09/24/21 0400 09/24/21 0832  BP: 130/79 120/75 122/85 132/83  Pulse: (!) 105   (!) 102  Resp: '19 14 18 17  '$ Temp: 98.2 F (36.8 C) 97.9 F (36.6 C) 98.2 F (36.8 C) 98.6 F (37 C)  TempSrc: Oral Oral Oral Oral  SpO2:      Weight:   76.2 kg   Height:        Wt Readings from Last 3 Encounters:  09/24/21 76.2 kg  09/10/21 81.6 kg   08/24/21 81.6 kg     Intake/Output Summary (Last 24 hours) at 09/24/2021 0956 Last data filed at 09/24/2021 0835 Gross per 24 hour  Intake --  Output 3400 ml  Net -3400 ml     Physical Exam  Awake Alert, No new F.N deficits, watching TV in No distress  [place].AT,PERRAL Supple Neck, No JVD,   Symmetrical Chest wall movement, Good air movement bilaterally, CTAB RRR,No Gallops, Rubs or new Murmurs,  +ve B.Sounds, Abd Soft, No tenderness,   No Cyanosis, Clubbing or edema    RN pressure injury documentation: Pressure Injury 09/10/21 Coccyx Right;Lower Stage 2 -  Partial thickness loss of dermis presenting as a shallow open injury with a red, pink wound bed without slough. (Active)  09/10/21 2000  Location: Coccyx  Location Orientation: Right;Lower  Staging: Stage 2 -  Partial thickness loss of dermis presenting as a shallow open injury with a red, pink wound bed without slough.  Wound Description (Comments):   Present on Admission: Yes     Data Review:    CBC Recent Labs  Lab 09/18/21 0619 09/19/21 0515 09/20/21 0251 09/21/21 0447 09/22/21 0145  WBC 8.8 11.3* 12.0* 12.6* 11.9*  HGB 12.0* 11.4* 11.9* 11.6* 11.3*  HCT 35.6* 34.8* 35.6* 35.1* 33.6*  PLT 434* 498* 539* 548* 502*  MCV 93.2 93.0 94.2 94.4 93.9  MCH 31.4 30.5 31.5 31.2 31.6  MCHC 33.7 32.8 33.4 33.0 33.6  RDW 14.4 14.6 14.9 15.1 15.1  LYMPHSABS  --  1.5 2.1 1.9 2.1  MONOABS  --  0.8 0.9 1.0 1.0  EOSABS  --  0.2 0.3 0.3 0.3  BASOSABS  --  0.1 0.0 0.0 0.1    Electrolytes Recent Labs  Lab 09/18/21 0619 09/19/21 0515 09/20/21 0251 09/21/21 0447 09/22/21 0145 09/23/21 0107  NA 135 136 136 134* 133*  --   K 4.0 4.0 4.4 4.5 3.8  --   CL 98 98 98 98 98  --   CO2 '31 30 31 31 27  '$ --   GLUCOSE 132* 113* 87 102* 132*  --   BUN <5* <5* <5* <5* 5*  --   CREATININE 0.49* 0.41* 0.45* 0.52* 0.44*  --   CALCIUM 8.0* 8.1* 8.4* 8.3* 8.4*  --   AST '21 19 24 20 20  '$ --   ALT '14 12 15 16 16  '$ --   ALKPHOS 63 56 62  63 66  --   BILITOT 0.3 0.3 0.4 0.2* 0.4  --   ALBUMIN 1.6* 1.8* 1.9* 2.0* 1.9*  --   MG 1.7 1.7 1.6* 1.8 1.6* 1.9  CRP 0.7 0.6 0.7 0.9 1.2*  --   PROCALCITON <0.10 <0.10 <0.10 <0.10 <0.10  --  BNP 76.8 39.6 53.0 28.3 31.7  --       Radiology Reports DG Chest 2 View  Result Date: 09/24/2021 CLINICAL DATA:  Empyema EXAM: CHEST - 2 VIEW COMPARISON:  09/20/2021 FINDINGS: Persistent small unchanged left pleural effusion compared with 09/20/2021. Left basilar airspace disease. Tiny left apical pneumothorax unchanged from the prior exam. Bilateral emphysematous changes. Chronic interstitial thickening bilaterally most severe in the right upper lobe. Stable cardiomediastinal silhouette. No aggressive osseous lesion. IMPRESSION: 1. Persistent small unchanged left pleural effusion compared with 09/20/2021. Tiny left apical pneumothorax unchanged from the prior exam. Electronically Signed   By: Kathreen Devoid M.D.   On: 09/24/2021 09:05   DG Chest Port 1 View  Result Date: 09/20/2021 CLINICAL DATA:  Intermittent shortness of breath. Two left-sided chest tubes removed over the past 3-4 days. EXAM: PORTABLE CHEST 1 VIEW COMPARISON:  09/18/2021, 09/17/2021, 09/16/2021 FINDINGS: Cardiac silhouette and mediastinal contours are within normal limits. The prior lateral left chest wall subcutaneous air is not visualized on the current study. Cardiac silhouette and mediastinal contours are within normal limits. Tiny left apical pneumothorax appears slightly decreased in size measuring approximally 5 mm in craniocaudal dimension compared to 10 mm on 09/18/2021. Mild left pleural effusion with left basilar heterogeneous and linear opacities, similar to prior. No right pleural effusion. Superior lucent emphysematous changes and chronic superior right lung reticular opacity and interstitial thickening unchanged. No acute skeletal abnormality. IMPRESSION:: IMPRESSION: 1. Mild interval decrease in minimal left apical  pneumothorax. 2. Unchanged small left pleural effusion with left basilar heterogeneous opacity, atelectasis versus pneumonia. 3. Chronic superior right upper lung interstitial thickening and reticular opacities. Electronically Signed   By: Yvonne Kendall M.D.   On: 09/20/2021 11:38

## 2021-09-24 NOTE — Plan of Care (Signed)
?  Problem: Health Behavior/Discharge Planning: ?Goal: Ability to manage health-related needs will improve ?Outcome: Progressing ?  ?Problem: Clinical Measurements: ?Goal: Ability to maintain clinical measurements within normal limits will improve ?Outcome: Progressing ?Goal: Will remain free from infection ?Outcome: Progressing ?Goal: Diagnostic test results will improve ?Outcome: Progressing ?Goal: Respiratory complications will improve ?Outcome: Progressing ?Goal: Cardiovascular complication will be avoided ?Outcome: Progressing ?  ?Problem: Activity: ?Goal: Risk for activity intolerance will decrease ?Outcome: Progressing ?  ?Problem: Nutrition: ?Goal: Adequate nutrition will be maintained ?Outcome: Progressing ?  ?Problem: Coping: ?Goal: Level of anxiety will decrease ?Outcome: Progressing ?  ?Problem: Elimination: ?Goal: Will not experience complications related to bowel motility ?Outcome: Progressing ?Goal: Will not experience complications related to urinary retention ?Outcome: Progressing ?  ?Problem: Pain Managment: ?Goal: General experience of comfort will improve ?Outcome: Progressing ?  ?Problem: Safety: ?Goal: Ability to remain free from injury will improve ?Outcome: Progressing ?  ?Problem: Skin Integrity: ?Goal: Risk for impaired skin integrity will decrease ?Outcome: Progressing ?  ?Problem: Safety: ?Goal: Non-violent Restraint(s) ?Outcome: Progressing ?  ?

## 2021-09-25 LAB — BASIC METABOLIC PANEL
Anion gap: 10 (ref 5–15)
BUN: 6 mg/dL — ABNORMAL LOW (ref 8–23)
CO2: 24 mmol/L (ref 22–32)
Calcium: 8.4 mg/dL — ABNORMAL LOW (ref 8.9–10.3)
Chloride: 97 mmol/L — ABNORMAL LOW (ref 98–111)
Creatinine, Ser: 0.44 mg/dL — ABNORMAL LOW (ref 0.61–1.24)
GFR, Estimated: >60 mL/min (ref >60)
Glucose, Bld: 133 mg/dL — ABNORMAL HIGH (ref 70–99)
Potassium: 4 mmol/L (ref 3.5–5.1)
Sodium: 131 mmol/L — ABNORMAL LOW (ref 135–145)

## 2021-09-25 LAB — CBC
HCT: 34 % — ABNORMAL LOW (ref 39.0–52.0)
Hemoglobin: 11.3 g/dL — ABNORMAL LOW (ref 13.0–17.0)
MCH: 31 pg (ref 26.0–34.0)
MCHC: 33.2 g/dL (ref 30.0–36.0)
MCV: 93.2 fL (ref 80.0–100.0)
Platelets: 437 10*3/uL — ABNORMAL HIGH (ref 150–400)
RBC: 3.65 MIL/uL — ABNORMAL LOW (ref 4.22–5.81)
RDW: 15.3 % (ref 11.5–15.5)
WBC: 9.2 10*3/uL (ref 4.0–10.5)
nRBC: 0 % (ref 0.0–0.2)

## 2021-09-25 LAB — OSMOLALITY, URINE: Osmolality, Ur: 304 mOsm/kg (ref 300–900)

## 2021-09-25 LAB — OSMOLALITY: Osmolality: 278 mOsm/kg (ref 275–295)

## 2021-09-25 LAB — MAGNESIUM: Magnesium: 1.6 mg/dL — ABNORMAL LOW (ref 1.7–2.4)

## 2021-09-25 LAB — URIC ACID: Uric Acid, Serum: 2.9 mg/dL — ABNORMAL LOW (ref 3.7–8.6)

## 2021-09-25 LAB — SODIUM, URINE, RANDOM: Sodium, Ur: 66 mmol/L

## 2021-09-25 MED ORDER — FUROSEMIDE 10 MG/ML IJ SOLN
40.0000 mg | Freq: Once | INTRAMUSCULAR | Status: DC
  Filled 2021-09-25: qty 4

## 2021-09-25 MED ORDER — MAGNESIUM SULFATE 4 GM/100ML IV SOLN
4.0000 g | Freq: Once | INTRAVENOUS | Status: AC
  Administered 2021-09-25: 4 g via INTRAVENOUS
  Filled 2021-09-25: qty 100

## 2021-09-25 NOTE — TOC Progression Note (Signed)
Transition of Care (TOC) - Progression Note  ? ? ?Patient Details  ?Name: Lance House ?MRN: [id number] ?Date of Birth: [birth year] ? ?Transition of Care (TOC) CM/SW Contact  ?Benard Halsted, LCSW ?Phone Number: ?09/25/2021, 9:23 AM ? ?Clinical Narrative:    ?Insurance approval still pending. CSW left voicemail for pasrr reviewer, Marye Round, to get update on screening.  ? ? ?Expected Discharge Plan: [place] ?Barriers to Discharge: Insurance Authorization ? ?Expected Discharge Plan and Services ?Expected Discharge Plan: [place] ?In-house Referral: Clinical Social Work ?Discharge Planning Services: CM Consult ?Post Acute Care Choice: Home Health ?Living arrangements for the past 2 months: Mobile Home ?Expected Discharge Date: 09/21/21               ?  ?  ?  ?  ?  ?  ?  ?  ?  ?  ? ? ?Social Determinants of Health (SDOH) Interventions ?  ? ?Readmission Risk Interventions ?No flowsheet data found. ? ?

## 2021-09-25 NOTE — Progress Notes (Signed)
PT Cancellation Note ? ?Patient Details ?Name: Thad Osoria ?MRN: [id number] ?DOB: 1950-08-30 ? ? ?Cancelled Treatment:    Reason Eval/Treat Not Completed: Other (comment) (Refused PT) ? ? ?Alvira Philips ?09/25/2021, 3:42 PM ?Arrie Aran M,PT ?Acute Rehab Services ?[phone] ?[phone] (pager)  ?

## 2021-09-25 NOTE — Progress Notes (Addendum)
PROGRESS NOTE                                                                                                                                                                                                             Patient Demographics:    Lance House, is a 71 y.o. male, DOB - [birth year], QQI:[id number]  Outpatient Primary MD for the patient is [facility]    LOS - 15  Admit date - 09/10/2021    No chief complaint on file.      Brief Narrative (HPI from H&P)   -  71 year old gentleman who lives in [place] with history of smoking, alcohol abuse recent admission for traumatic intracranial hemorrhage after a fall, he was admitted on 08/15/2021 and discharged on 08/24/2021 for this issue.  He went to [facility] but was sent back to [facility] ER within a few hours because of various complaints.  Due to lack of bed availability he was in the ER for over a week, subsequently developed some chest pain and work-up showed left-sided pleural effusion/empyema and he was admitted to [facility] by critical care team on 09/10/2021.  He was seen by cardiothoracic surgery underwent VATS procedure along with chest tube placement on 09/11/2021.  His chest tubes were removed by cardiothoracic surgery on 09/17/2021, he was stabilized and transferred to my care on 09/18/2021 on day 8 of his  hospital stay.  He is medically ready for discharge initially he wanted to go home with home health but now wants to go to SNF.   Subjective:   Patient in bed in no distress watching television, still complaining of 10 out of 10 pleuritic left-sided chest pain and wants his narcotics not to be reduced, denies any shortness of breath or focal weakness.   Assessment  & Plan :     Septic Shock due to left-sided pneumonia and empyema - seen by PCCM and cardiothoracic surgery underwent VATS procedure with chest tube  placement on 09/11/2021, sepsis pathophysiology has resolved, pleural fluid growing strep intermedius.  Currently on Rocephin total duration of antibiotics 3 weeks.  Chest tube removed on 09/17/2021.  Continue supportive care.  Encouraged to sit in chair use I-S for pulmonary toiletry.  Advance activity.  Monitor clinically.  Chest x-ray on 09/24/2021 remains unchanged and stable.  Of  note patient appears to be in no distress but continues to demand high doses of narcotics, clearly exhibiting narcotic seeking behavior.  2.  Acute hypoxic respiratory failure.  Due to #1 above.  Resolved.  3.  HX of narcotic use, alcohol and tobacco abuse.  Counseled to smoking and alcohol and taper off narcotics gradually but he does not seem to be motivated.  He uses narcotics at home for chronic back pain.  I think his pneumonia and empyema could have been due to chronic microaspiration from being under the effect of alcohol and narcotics.  4.  Recent ICH admission after a fall due to alcohol abuse.  PT OT advance activity may require SNF.  5.  Mild PCM.  Protein supplements.  6.  Unstageable decubitus ulcer.  Unclear onset, wound care following.  7.  History of gout.  On allopurinol.   8.  Chronic pain.  Minimize narcotic and benzo use as much as possible.  He is exhibiting signs of narcotic seeking behavior when he is seeking more narcotics while appearing to be in no distress at times eating breakfast, on 09/23/2021 he was woken up from sleep and he asked for narcotics.  9.  Hyponatremia.  Appears to be SIADH.  Fluid restriction, Lasix and monitor.  10.  Hypomagnesemia.  Replaced.   Noncompliance with medications in the hospital.  Besides narcotics, Neurontin and his IV antibiotic patient is refusing multiple medications according to the nursing staff.  Extensively counseled.  Warned him that this could result in disability and death .       Condition - Fair  Family Communication  :  None present  Code  Status :  Full  Consults  :  PCCM, TCTS  PUD Prophylaxis :    Procedures  :     L. Lung VATS - Chest tube placement by TCTS - chest tube removed 09/17/2021.      Disposition Plan  :    Status is: Inpatient  DVT Prophylaxis  :    heparin injection 5,000 Units Start: 09/10/21 2200 SCDs Start: 09/10/21 1815   Lab Results  Component Value Date   PLT 437 (H) 09/25/2021    Diet :  Diet Order             Diet regular Room service appropriate? Yes; Fluid consistency: Thin; Fluid restriction: 1500 mL Fluid  Diet effective now           Diet - low sodium heart healthy                    Inpatient Medications  Scheduled Meds:  (feeding supplement) PROSource Plus  30 mL Oral BID BM   allopurinol  300 mg Oral Daily   alum & mag hydroxide-simeth  30 mL Oral Once   bethanechol  5 mg Oral TID   bisacodyl  10 mg Oral Daily   docusate sodium  100 mg Oral BID   folic acid  1 mg Oral Daily   furosemide  40 mg Intravenous Once   gabapentin  400 mg Oral TID   heparin  5,000 Units Subcutaneous Q8H   lactose free nutrition  237 mL Oral TID WC   lidocaine  1 patch Transdermal Q24H   mouth rinse  15 mL Mouth Rinse BID   multivitamin with minerals  1 tablet Oral Daily   senna-docusate  1 tablet Oral QHS   thiamine  100 mg Oral Daily   Continuous Infusions:  sodium chloride  cefTRIAXone (ROCEPHIN)  IV 2 g (09/24/21 1155)   PRN Meds:.sodium chloride, albuterol, clonazePAM, guaiFENesin-dextromethorphan, ipratropium-albuterol, ondansetron (ZOFRAN) IV, oxyCODONE    Time Spent in minutes  30   Lala Lund M.D on 09/25/2021 at 10:14 AM  To page go to [email or web address]   [facility] -  Office  [phone]  See all Orders from today for further details    Objective:   Vitals:   09/25/21 0016 09/25/21 0415 09/25/21 0500 09/25/21 0751  BP: 105/67 101/71  120/79  Pulse:  96  88  Resp: '16 17  14  '$ Temp: 98.6 F (37 C) 98.3 F (36.8 C)  97.6 F (36.4 C)   TempSrc: Oral Oral  Oral  SpO2: 96% 98%  94%  Weight:   76.1 kg   Height:        Wt Readings from Last 3 Encounters:  09/25/21 76.1 kg  09/10/21 81.6 kg  08/24/21 81.6 kg     Intake/Output Summary (Last 24 hours) at 09/25/2021 1014 Last data filed at 09/25/2021 0929 Gross per 24 hour  Intake 200 ml  Output 3675 ml  Net -3475 ml     Physical Exam  Awake Alert, No new F.N deficits, again in no distress watching television [place].AT,PERRAL Supple Neck, No JVD,   Symmetrical Chest wall movement, Good air movement bilaterally, CTAB RRR,No Gallops, Rubs or new Murmurs,  +ve B.Sounds, Abd Soft, No tenderness,   No Cyanosis, Clubbing or edema     RN pressure injury documentation: Pressure Injury 09/10/21 Coccyx Right;Lower Stage 2 -  Partial thickness loss of dermis presenting as a shallow open injury with a red, pink wound bed without slough. (Active)  09/10/21 2000  Location: Coccyx  Location Orientation: Right;Lower  Staging: Stage 2 -  Partial thickness loss of dermis presenting as a shallow open injury with a red, pink wound bed without slough.  Wound Description (Comments):   Present on Admission: Yes     Data Review:    CBC Recent Labs  Lab 09/19/21 0515 09/20/21 0251 09/21/21 0447 09/22/21 0145 09/25/21 0148  WBC 11.3* 12.0* 12.6* 11.9* 9.2  HGB 11.4* 11.9* 11.6* 11.3* 11.3*  HCT 34.8* 35.6* 35.1* 33.6* 34.0*  PLT 498* 539* 548* 502* 437*  MCV 93.0 94.2 94.4 93.9 93.2  MCH 30.5 31.5 31.2 31.6 31.0  MCHC 32.8 33.4 33.0 33.6 33.2  RDW 14.6 14.9 15.1 15.1 15.3  LYMPHSABS 1.5 2.1 1.9 2.1  --   MONOABS 0.8 0.9 1.0 1.0  --   EOSABS 0.2 0.3 0.3 0.3  --   BASOSABS 0.1 0.0 0.0 0.1  --     Electrolytes Recent Labs  Lab 09/19/21 0515 09/20/21 0251 09/21/21 0447 09/22/21 0145 09/23/21 0107 09/25/21 0148  NA 136 136 134* 133*  --  131*  K 4.0 4.4 4.5 3.8  --  4.0  CL 98 98 98 98  --  97*  CO2 '30 31 31 27  '$ --  24  GLUCOSE 113* 87 102* 132*  --  133*  BUN  <5* <5* <5* 5*  --  6*  CREATININE 0.41* 0.45* 0.52* 0.44*  --  0.44*  CALCIUM 8.1* 8.4* 8.3* 8.4*  --  8.4*  AST '19 24 20 20  '$ --   --   ALT '12 15 16 16  '$ --   --   ALKPHOS 56 62 63 66  --   --   BILITOT 0.3 0.4 0.2* 0.4  --   --   ALBUMIN 1.8*  1.9* 2.0* 1.9*  --   --   MG 1.7 1.6* 1.8 1.6* 1.9 1.6*  CRP 0.6 0.7 0.9 1.2*  --   --   PROCALCITON <0.10 <0.10 <0.10 <0.10  --   --   BNP 39.6 53.0 28.3 31.7  --   --       Radiology Reports DG Chest 2 View  Result Date: 09/24/2021 CLINICAL DATA:  Empyema EXAM: CHEST - 2 VIEW COMPARISON:  09/20/2021 FINDINGS: Persistent small unchanged left pleural effusion compared with 09/20/2021. Left basilar airspace disease. Tiny left apical pneumothorax unchanged from the prior exam. Bilateral emphysematous changes. Chronic interstitial thickening bilaterally most severe in the right upper lobe. Stable cardiomediastinal silhouette. No aggressive osseous lesion. IMPRESSION: 1. Persistent small unchanged left pleural effusion compared with 09/20/2021. Tiny left apical pneumothorax unchanged from the prior exam. Electronically Signed   By: Kathreen Devoid M.D.   On: 09/24/2021 09:05

## 2021-09-26 ENCOUNTER — Encounter: Payer: Medicare HMO | Admitting: Thoracic Surgery (Cardiothoracic Vascular Surgery)

## 2021-09-26 LAB — BASIC METABOLIC PANEL
Anion gap: 7 (ref 5–15)
BUN: <5 mg/dL — ABNORMAL LOW (ref 8–23)
CO2: 28 mmol/L (ref 22–32)
Calcium: 8.6 mg/dL — ABNORMAL LOW (ref 8.9–10.3)
Chloride: 96 mmol/L — ABNORMAL LOW (ref 98–111)
Creatinine, Ser: 0.47 mg/dL — ABNORMAL LOW (ref 0.61–1.24)
GFR, Estimated: >60 mL/min (ref >60)
Glucose, Bld: 104 mg/dL — ABNORMAL HIGH (ref 70–99)
Potassium: 4.4 mmol/L (ref 3.5–5.1)
Sodium: 131 mmol/L — ABNORMAL LOW (ref 135–145)

## 2021-09-26 LAB — MAGNESIUM: Magnesium: 2 mg/dL (ref 1.7–2.4)

## 2021-09-26 MED ORDER — FUROSEMIDE 20 MG PO TABS: 20.0000 mg | ORAL_TABLET | Freq: Every day | ORAL | 11 refills | Status: DC

## 2021-09-26 MED ORDER — OXYCODONE HCL 5 MG PO TABS: 5.0000 mg | ORAL_TABLET | ORAL | 0 refills | Status: DC | PRN

## 2021-09-26 MED ORDER — GABAPENTIN 400 MG PO CAPS
400.0000 mg | ORAL_CAPSULE | Freq: Three times a day (TID) | ORAL | Status: DC
Start: 2021-09-26 — End: 2022-09-14

## 2021-09-26 MED ORDER — CLONAZEPAM 1 MG PO TABS: 0.5000 mg | ORAL_TABLET | Freq: Two times a day (BID) | ORAL | 0 refills | Status: DC | PRN

## 2021-09-26 NOTE — TOC Transition Note (Signed)
Transition of Care (TOC) - CM/SW Discharge Note ? ? ?Patient Details  ?Name: Lance House ?MRN: [id number] ?Date of Birth: [birth year] ? ?Transition of Care (TOC) CM/SW Contact:  ?Benard Halsted, LCSW ?Phone Number: ?09/26/2021, 1:24 PM ? ? ?Clinical Narrative:    ?Patient will DC to: [facility] ?Anticipated DC date: 09/26/21 ?Family notified: Spouse, Caren Griffins ?Transport by: Corey Harold ? ? ?Per MD patient ready for DC to [facility]. RN to call report prior to discharge ([phone]). RN, patient, patient's family, and facility notified of DC. Discharge Summary and FL2 sent to facility. DC packet on chart. Ambulance transport requested for patient.  ? ?CSW will sign off for now as social work intervention is no longer needed. Please consult [place] again if new needs arise. ? ? ? ? ?Final next level of care: [place] ?Barriers to Discharge: Barriers Resolved ? ? ?Patient Goals and CMS Choice ?Patient states their goals for this hospitalization and ongoing recovery are:: To get better and go home. ?CMS Medicare.gov Compare Post Acute Care list provided to:: Patient ?Choice offered to / list presented to : Patient ? ?Discharge Placement ?PASRR number recieved: 09/26/21 ?           ?Patient chooses bed at: [facility] ?Patient to be transferred to facility by: [facility] ?Name of family member notified: Spouse ?Patient and family notified of of transfer: 09/26/21 ? ?Discharge Plan and Services ?In-house Referral: Clinical Social Work ?Discharge Planning Services: CM Consult ?Post Acute Care Choice: Home Health          ?  ?  ?  ?  ?  ?  ?  ?  ?  ?  ? ?Social Determinants of Health (SDOH) Interventions ?  ? ? ?Readmission Risk Interventions ?No flowsheet data found. ? ? ? ? ?

## 2021-09-26 NOTE — TOC Progression Note (Addendum)
Transition of Care (TOC) - Progression Note  ? ? ?Patient Details  ?Name: Lance House ?MRN: [id number] ?Date of Birth: Feb 27, 1951 ? ?Transition of Care (TOC) CM/SW Contact  ?Benard Halsted, LCSW ?Phone Number: ?09/26/2021, 8:39 AM ? ?Clinical Narrative:    ?8:39am-Insurance approval received, however [facility] number is still pending.  ? ?CSW spoke with [place] at [employer] and she stated she just submitted his information. ? ?1pm-[facility] number received and placed on Fl2. CSW updated patient's spouse that patient will be discharging there via [facility]. ? ? ?Expected Discharge Plan: [place] ?Barriers to Discharge: Insurance Authorization ? ?Expected Discharge Plan and Services ?Expected Discharge Plan: [place] ?In-house Referral: Clinical Social Work ?Discharge Planning Services: CM Consult ?Post Acute Care Choice: Home Health ?Living arrangements for the past 2 months: Mobile Home ?Expected Discharge Date: 09/21/21               ?  ?  ?  ?  ?  ?  ?  ?  ?  ?  ? ? ?Social Determinants of Health (SDOH) Interventions ?  ? ?Readmission Risk Interventions ?No flowsheet data found. ? ?

## 2021-09-26 NOTE — Progress Notes (Signed)
Attempted to call discharge report to [facility] [phone] per Cedric Fishman, LCSW ?

## 2021-09-26 NOTE — Progress Notes (Signed)
I have attempted to call report 2 more times but no answer.  Junie Panning, RN charge nurse aware. ?

## 2021-09-26 NOTE — Progress Notes (Addendum)
Physical Therapy Treatment ?Patient Details ?Name: Lance House ?MRN: [id number] ?DOB: June 23, 1951 ?Today's Date: 09/26/2021 ? ? ?History of Present Illness Pt is a 71 y/o male presenting 2/19 with SOB, weakness, L sided chest pain for several weeks. Found hypoxic, septic shock with L sided empyema.  Recent admission for traumatic ICH after fall and dc'd to SNF but left AMA. S/P VATS 2/20. PMH includes: ETOH, depression, compression fractures. ? ?  ?PT Comments  ? ? Pt admitted with above diagnosis. Pt was able to incr ambulation distance today with fair balance with RW.  Pt fatigues and does need seated rest break at times. Pt needs cues for safety as he has poor safety awareness.  Pt did meet 4/4 goals and goals revised today. Will continue to follow acutely and progress as able.  Pt currently with functional limitations due to balance and endurance deficits. Pt will benefit from skilled PT to increase their independence and safety with mobility to allow discharge to the venue listed below.      ?Recommendations for follow up therapy are one component of a multi-disciplinary discharge planning process, led by the attending physician.  Recommendations may be updated based on patient status, additional functional criteria and insurance authorization. ? ?Follow Up Recommendations ? Skilled nursing-short term rehab (<3 hours/day) (patient now agreeable) ?  ?  ?Assistance Recommended at Discharge Frequent or constant Supervision/Assistance  ?Patient can return home with the following Assistance with cooking/housework;Direct supervision/assist for medications management;Direct supervision/assist for financial management;Assist for transportation;Help with stairs or ramp for entrance;A little help with walking and/or transfers ?  ?Equipment Recommendations ? Rolling walker (2 wheels);Wheelchair (measurements PT);Wheelchair cushion (measurements PT);BSC/3in1  ?  ?Recommendations for Other Services   ? ? ?  ?Precautions /  Restrictions Precautions ?Precautions: Fall ?Restrictions ?Weight Bearing Restrictions: No  ?  ? ?Mobility ? Bed Mobility ?Overal bed mobility: Needs Assistance ?Bed Mobility: Supine to Sit, Sit to Supine ?Rolling: Supervision ?  ?Supine to sit: Modified independent (Device/Increase time) ?Sit to supine: Modified independent (Device/Increase time) ?  ?General bed mobility comments: supervision for safety as pt slightly impulsive; when rolling leaned his head and shoulders out beyond bedrail (each direction) and then complains of rib pain after leaning against bedrail.  Pt also took incr time to come to eOB needing max encouragment to participate. ?  ? ?Transfers ?Overall transfer level: Needs assistance ?Equipment used: Rolling walker (2 wheels) ?Transfers: Sit to/from Stand ?Sit to Stand: Min guard ?  ?  ?  ?  ?  ?General transfer comment: minguard for balance upon standing (posterior imbalance) ?  ? ?Ambulation/Gait ?Ambulation/Gait assistance: Min assist, Min guard ?Gait Distance (Feet): 250 Feet (125 feet x 2) ?Assistive device: Rolling walker (2 wheels) ?Gait Pattern/deviations: Step-through pattern, Decreased step length - right, Decreased step length - left, Trunk flexed, Drifts right/left, Wide base of support ?Gait velocity: decreased ?Gait velocity interpretation: <1.31 ft/sec, indicative of household ambulator ?  ?General Gait Details: pt pushes RW too far ahead, runs into objects with unsafe repositioning of RW as trying to get unstuck from furniture. Very poor walker safety; will improve proximity to RW for several feet and then reverts back to pushing it too far ahead of him.  Pt with significant eversion of left LE > right LE but eversion on bil LEs that pt states is since birth.  Pt flexes at trunk slightly and cannot stand fully upright even with cues. No significant LOB with use of RW today. Did have to have a  seated rest break in the hall. ? ? ?Stairs ?  ?  ?  ?  ?  ? ? ?Wheelchair Mobility ?   ? ?Modified Rankin (Stroke Patients Only) ?  ? ? ?  ?Balance Overall balance assessment: Needs assistance ?Sitting-balance support: No upper extremity supported ?Sitting balance-Leahy Scale: Normal ?  ?  ?Standing balance support: During functional activity ?Standing balance-Leahy Scale: Poor ?Standing balance comment: Pt needs UE support on the RW for standing ?  ?  ?  ?  ?  ?  ?  ?  ?  ?  ?  ?  ? ?  ?Cognition Arousal/Alertness: Awake/alert ?Behavior During Therapy: [facility] for tasks assessed/performed ?Overall Cognitive Status: Impaired/Different from baseline ?Area of Impairment: Awareness ?  ?  ?  ?  ?  ?  ?  ?  ?  ?Current Attention Level: Sustained ?Memory: Decreased short-term memory ?Following Commands: Follows one step commands consistently ?Safety/Judgement:  (decreased anticipatory awareness) ?Awareness: Intellectual ?Problem Solving: Requires tactile cues, Requires verbal cues ?General Comments: Pt reports getting up earlier late last night and standing from the side of the bed and walking in the room a few steps without anyone in the room.  Unsure accuracy of this statement though.  Mod instructional cueing for participation in PT session secondary to just getting his pain meds per his report and wanting time to have them work. ?  ?  ? ?  ?Exercises Total Joint Exercises ?Long Arc Quad: Both, 5 reps, AROM, Seated ?Other Exercises ?Other Exercises: Pt declined more LE exercises ? ?  ?General Comments General comments (skin integrity, edema, etc.): VSS with sats on RA 95% ?  ?  ? ?Pertinent Vitals/Pain Pain Assessment ?Pain Assessment: Faces ?Faces Pain Scale: Hurts whole lot ?Pain Location: pt reports pain in his right abdomen and back ?Pain Descriptors / Indicators: Discomfort, Sore ?Pain Intervention(s): Limited activity within patient's tolerance, Monitored during session, Repositioned, Premedicated before session  ? ? ?Home Living   ?  ?  ?  ?  ?  ?  ?  ?  ?  ?   ?  ?Prior Function    ?  ?  ?   ? ?PT  Goals (current goals can now be found in the care plan section) Acute Rehab PT Goals ?Patient Stated Goal: go to rehab and get stronger ?PT Goal Formulation: With patient ?Time For Goal Achievement: 10/10/21 ?Potential to Achieve Goals: Fair ?Progress towards PT goals: Progressing toward goals ? ?  ?Frequency ? ? ? Min 2X/week ? ? ? ?  ?PT Plan Current plan remains appropriate  ? ? ?Co-evaluation PT/OT/SLP Co-Evaluation/Treatment: Yes ?Reason for Co-Treatment: For patient/therapist safety ?PT goals addressed during session: Mobility/safety with mobility ?OT goals addressed during session: ADL's and self-care;Strengthening/ROM ?  ? ?  ?AM-PAC PT "6 Clicks" Mobility   ?Outcome Measure ? Help needed turning from your back to your side while in a flat bed without using bedrails?: None ?Help needed moving from lying on your back to sitting on the side of a flat bed without using bedrails?: None ?Help needed moving to and from a bed to a chair (including a wheelchair)?: A Little ?Help needed standing up from a chair using your arms (e.g., wheelchair or bedside chair)?: A Little ?Help needed to walk in hospital room?: A Little ?Help needed climbing 3-5 steps with a railing? : Total ?6 Click Score: 18 ? ?  ?End of Session Equipment Utilized During Treatment: Gait belt ?Activity Tolerance: Patient limited  by fatigue ?Patient left: with call bell/phone within reach;in bed;with bed alarm set ?Nurse Communication: Mobility status ?PT Visit Diagnosis: Unsteadiness on feet (R26.81);Other abnormalities of gait and mobility (R26.89);Repeated falls (R29.6);Muscle weakness (generalized) (M62.81);History of falling (Z91.81);Difficulty in walking, not elsewhere classified (R26.2);Adult, failure to thrive (R62.7);Pain ?Pain - Right/Left:  (L) ?Pain - part of body: Hip ?  ? ? ?Time: 9747-1855 ?PT Time Calculation (min) (ACUTE ONLY): 32 min ? ?Charges:  $Gait Training: 8-22 mins          ?          ? ?Keitha Kolk M,PT ?Acute Rehab  Services ?[phone] ?[phone] (pager)  ? ? ?Alvira Philips ?09/26/2021, 11:18 AM ? ?

## 2021-09-26 NOTE — Progress Notes (Signed)
Patient continues to be belligerent with staff and refuses all medication expect for pain and anxiety meds.  Charge RN aware. ?

## 2021-09-26 NOTE — Discharge Summary (Signed)
Physician Discharge Summary  Lance House KGY:[id number] DOB: 1951/02/23 DOA: 09/10/2021  PCP: [facility]  Admit date: 09/10/2021 Discharge date: 09/26/2021  Admitted From: Home Disposition:  SNF   Recommendations for Outpatient Follow-up:  Follow up with PCP in 1-2 weeks Please obtain BMP/CBC in one week He is check two-view chest x-ray in 1 week.  Kindly check CT scan reports from last admission including CT chest and CT head.  Needs outpatient MRI brain with and without contrast in 1 to 2 weeks postdischarge.   Discharge Condition:Stable CODE STATUS:FULL Diet recommendation: Heart Healthy   Brief/Interim Summary: 71 year-old gentleman who lives in [facility] with history of smoking, alcohol abuse recent admission for traumatic intracranial hemorrhage after a fall, he was admitted on 08/15/2021 and discharged on 08/24/2021 for this issue.  He went to [facility] but was sent back to [facility] within a few hours because of various complaints.  Due to lack of bed availability he was in the ER for over a week, subsequently developed some chest pain and work-up showed left-sided pleural effusion/empyema and he was admitted to [facility] by critical care team on 09/10/2021.  He was seen by cardiothoracic surgery underwent VATS procedure along with chest tube placement on 09/11/2021.   His chest tubes were removed by cardiothoracic surgery on 09/17/2021, patient has been cleared for discharge 3/2, initially refusing rehab so discharge has been arranged for home, then finally he is agreeing to rehab, where awaiting on passer which was available on 3/7.      Septic Shock due to left-sided pneumonia and empyema - seen by PCCM and cardiothoracic surgery underwent VATS procedure with chest tube placement on 09/11/2021, sepsis pathophysiology has resolved, pleural fluid growing strep intermedius.  He has already been on 2 weeks of IV antibiotics will get another  week of Keflex completing 3 weeks of total antibiotics.  Chest tube removed on 09/17/2021.  Continue supportive care.  Encouraged to sit in chair use I-S for pulmonary toiletry.  Advance activity.  Monitor clinically.  X-rays are stable, will need  outpatient PCP and cardiothoracic surgery follow-up.   2.  Acute hypoxic respiratory failure.  Due to #1 above.  Resolved.   3.  HX of narcotic use, alcohol and tobacco abuse.  Counseled to smoking and alcohol and taper off narcotics gradually but he does not seem to be motivated.  He uses narcotics at home for chronic back pain.  I think his pneumonia and empyema could have been due to chronic microaspiration from being under the effect of alcohol and narcotics.   4.  Recent [facility] admission after a fall due to alcohol abuse.  PT OT advance activity may require SNF.   5.  Mild PCM.  Protein supplements.   6.  Unstageable decubitus ulcer.  Unclear onset, wound care following.  Requested to stay in chair in the daytime and keep his bottom clean and dry at all times.   7.  History of gout.  On allopurinol.    8.  Chronic pain.  Minimize narcotic and benzo use as much as possible.  Unseld not to overuse.   9.  Nonspecific CT head findings.  Request PCP to check MRI brain in 1 to 2 weeks post discharge.  Patient also given written instructions for the same.  10. Pressure ulcer  Pressure Injury 09/10/21 Coccyx Right;Lower Stage 2 -  Partial thickness loss of dermis presenting as a shallow open injury with a red, pink  wound bed without slough. (Active)  09/10/21 2000  Location: Coccyx  Location Orientation: Right;Lower  Staging: Stage 2 -  Partial thickness loss of dermis presenting as a shallow open injury with a red, pink wound bed without slough.  Wound Description (Comments):   Present on Admission: Yes         Discharge Diagnoses:  Principal Problem:   Empyema ([place]) Active Problems:   Pressure injury of skin    Discharge  Instructions  Discharge Instructions     Diet - low sodium heart healthy   Complete by: As directed    Diet - low sodium heart healthy   Complete by: As directed    Discharge instructions   Complete by: As directed    Follow with Primary MD [facility] in 7 days, you need an outpatient MRI of the brain to be ordered by a family physician.  In the next 1 to 2 weeks  Get CBC, CMP, 2 view Chest X ray -  checked next visit within 1 week by Primary MD    Activity: As tolerated with Full fall precautions use walker/cane & assistance as needed  Disposition Home     Diet: Heart Healthy with feeding assistance and aspiration precautions.    Special Instructions: If you have smoked or chewed Tobacco  in the last 2 yrs please stop smoking, stop any regular Alcohol  and or any Recreational drug use.  On your next visit with your primary care physician please Get Medicines reviewed and adjusted.  Please request your Prim.MD to go over all Hospital Tests and Procedure/Radiological results at the follow up, please get all Hospital records sent to your Prim MD by signing hospital release before you go home.  If you experience worsening of your admission symptoms, develop shortness of breath, life threatening emergency, suicidal or homicidal thoughts you must seek medical attention immediately by calling 911 or calling your MD immediately  if symptoms less severe.  You Must read complete instructions/literature along with all the possible adverse reactions/side effects for all the Medicines you take and that have been prescribed to you. Take any new Medicines after you have completely understood and accpet all the possible adverse reactions/side effects.   Discharge wound care:   Complete by: As directed    Keep your chest tube site clean and dry at all times, stay in the chair at home during the daytime.   For home use only DME Nebulizer machine   Complete by: As directed     Patient needs a nebulizer to treat with the following condition: COPD (chronic obstructive pulmonary disease) (HCC)   Length of Need: 6 Months   Increase activity slowly   Complete by: As directed    Increase activity slowly   Complete by: As directed    No wound care   Complete by: As directed       Allergies as of 09/26/2021       Reactions   Ciprocin-fluocin-procin [fluocinolone] Anaphylaxis   Hip hurt   Ciprofloxacin    Colchicine    Duloxetine Diarrhea, Nausea And Vomiting, Other (See Comments)   Altered mental status        Medication List     STOP taking these medications    oxycodone 5 MG capsule Commonly known as: OXY-IR Replaced by: oxyCODONE 5 MG immediate release tablet       TAKE these medications    acetaminophen 325 MG tablet Commonly known as: TYLENOL Take 2 tablets (  650 mg total) by mouth every 6 (six) hours as needed for mild pain (or Fever >/= 101).   albuterol 108 (90 Base) MCG/ACT inhaler Commonly known as: VENTOLIN HFA Inhale 1-2 puffs into the lungs every 4 (four) hours as needed for shortness of breath.   allopurinol 300 MG tablet Commonly known as: ZYLOPRIM Take 300 mg by mouth daily.   cephALEXin 500 MG capsule Commonly known as: KEFLEX Take 1 capsule (500 mg total) by mouth 3 (three) times daily for 10 days.   clonazePAM 1 MG tablet Commonly known as: KLONOPIN Take 0.5 tablets (0.5 mg total) by mouth 2 (two) times daily as needed.   docusate sodium 100 MG capsule Commonly known as: COLACE Take 2 capsules (200 mg total) by mouth daily.   folic acid 1 MG tablet Commonly known as: FOLVITE Take 1 tablet (1 mg total) by mouth daily.   furosemide 20 MG tablet Commonly known as: Lasix Take 1 tablet (20 mg total) by mouth daily.   gabapentin 400 MG capsule Commonly known as: NEURONTIN Take 1 capsule (400 mg total) by mouth 3 (three) times daily.   ipratropium-albuterol 0.5-2.5 (3) MG/3ML Soln Commonly known as: DUONEB Take 3  mLs by nebulization every 6 (six) hours as needed.   lidocaine 5 % Commonly known as: LIDODERM Place 1 patch onto the skin daily for 5 doses. Remove & Discard patch within 12 hours or as directed by MD   oxyCODONE 5 MG immediate release tablet Commonly known as: Oxy IR/ROXICODONE Take 1 tablet (5 mg total) by mouth every 4 (four) hours as needed. Replaces: oxycodone 5 MG capsule   Senexon-S 8.6-50 MG tablet Generic drug: senna-docusate Take 1 tablet by mouth at bedtime as needed for mild constipation.   thiamine 100 MG tablet Take 1 tablet (100 mg total) by mouth daily.   Vitamin D (Ergocalciferol) 1.25 MG (50000 UNIT) Caps capsule Commonly known as: DRISDOL Take 50,000 Units by mouth once a week.               Durable Medical Equipment  (From admission, onward)           Start     Ordered   09/21/21 0823  For home use only DME lightweight manual wheelchair with seat cushion  Once       Comments: Patient suffers from Empyema, weakness which impairs their ability to perform daily activities like bathing, dressing, feeding, grooming, and toileting in the home.  A cane, crutch, or walker will not resolve  issue with performing activities of daily living. A wheelchair will allow patient to safely perform daily activities. Patient is not able to propel themselves in the home using a standard weight wheelchair due to arm weakness, endurance, and general weakness. Patient can self propel in the lightweight wheelchair. Length of need 6 months . Accessories: elevating leg rests (ELRs), wheel locks, extensions and anti-tippers.   09/21/21 2025   09/21/21 0822  For home use only DME Walker rolling  Once       Comments: 5 wheel  Question Answer Comment  Walker: With 5 Inch Wheels   Patient needs a walker to treat with the following condition Weakness      09/21/21 0821   09/21/21 0822  For home use only DME oxygen  Once       Question Answer Comment  Length of Need 6 Months    Mode or (Route) Nasal cannula   Liters per Minute 2   Frequency Continuous (  stationary and portable oxygen unit needed)   Oxygen conserving device Yes   Oxygen delivery system Gas      09/21/21 0821   09/21/21 0000  For home use only DME Nebulizer machine       Question Answer Comment  Patient needs a nebulizer to treat with the following condition COPD (chronic obstructive pulmonary disease) ([place])   Length of Need 6 Months      09/21/21 0835              Discharge Care Instructions  (From admission, onward)           Start     Ordered   09/21/21 0000  Discharge wound care:       Comments: Keep your chest tube site clean and dry at all times, stay in the chair at home during the daytime.   09/21/21 0932            Contact information for follow-up providers     Melrose Nakayama, MD. Go on 10/10/2021.   Specialty: Cardiothoracic Surgery Why: PA/LAT CXR to be taken (at [place] which is in the same building as Dr. Leonarda Salon office) on 03/07 at 12:45 pm pm;Appointment time is at 1:15 pm Contact information: [place] [address] [address]. Schedule an appointment as soon as possible for a visit in 1 week(s).   Contact information: [place] [address] [phone]              Contact information for after-discharge care     Destination     [facility] Preferred SNF .   Service: Skilled Nursing Contact information: [place] [address] [phone]                    Allergies  Allergen Reactions   Ciprocin-Fluocin-Procin [Fluocinolone] Anaphylaxis    Hip hurt   Ciprofloxacin    Colchicine    Duloxetine Diarrhea, Nausea And Vomiting and Other (See Comments)    Altered mental status    Consultations: PCCM, TCTS   Procedures/Studies: DG Chest 2 View  Result Date:  09/24/2021 CLINICAL DATA:  Empyema EXAM: CHEST - 2 VIEW COMPARISON:  09/20/2021 FINDINGS: Persistent small unchanged left pleural effusion compared with 09/20/2021. Left basilar airspace disease. Tiny left apical pneumothorax unchanged from the prior exam. Bilateral emphysematous changes. Chronic interstitial thickening bilaterally most severe in the right upper lobe. Stable cardiomediastinal silhouette. No aggressive osseous lesion. IMPRESSION: 1. Persistent small unchanged left pleural effusion compared with 09/20/2021. Tiny left apical pneumothorax unchanged from the prior exam. Electronically Signed   By: Kathreen Devoid M.D.   On: 09/24/2021 09:05   DG Chest 2 View  Result Date: 09/18/2021 CLINICAL DATA:  Cough, recent chest tube removal, empyema EXAM: CHEST - 2 VIEW COMPARISON:  Chest radiograph from one day prior. FINDINGS: Interval removal of left chest tube. Mild subcutaneous emphysema in the lateral left chest wall is similar. Stable cardiomediastinal silhouette with normal heart size. No right pneumothorax. Small left hydropneumothorax, overall stable size, with stable 5% small left apical pneumothorax component and with filling of the previously visualized small left lateral basilar pneumothorax component with a small amount of left pleural fluid. No right pleural effusion. Patchy left lung base opacity is similar. Emphysema and chronic hazy reticular opacity throughout the right lung are similar. IMPRESSION: 1. Small left hydropneumothorax, overall  stable size, with stable 5% left apical pneumothorax component and filling of the previously visualized small left lateral basilar pneumothorax component with a small amount of left pleural fluid. Interval removal of left chest tube. 2. Stable patchy left lung base opacity, favor atelectasis. 3. Chronic hazy reticular opacity throughout the right lung. Emphysema. Electronically Signed   By: Ilona Sorrel M.D.   On: 09/18/2021 11:12   CT HEAD WO CONTRAST  (5MM)  Result Date: 09/10/2021 CLINICAL DATA:  Unwitnessed fall, found down EXAM: CT HEAD WITHOUT CONTRAST CT CERVICAL SPINE WITHOUT CONTRAST TECHNIQUE: Multidetector CT imaging of the head and cervical spine was performed following the standard protocol without intravenous contrast. Multiplanar CT image reconstructions of the cervical spine were also generated. RADIATION DOSE REDUCTION: This exam was performed according to the departmental dose-optimization program which includes automated exposure control, adjustment of the mA and/or kV according to patient size and/or use of iterative reconstruction technique. COMPARISON:  09/04/2021 FINDINGS: CT HEAD FINDINGS Brain: No definite evidence of acute infarction, hemorrhage, hydrocephalus, extra-axial collection or mass lesion/mass effect. Unchanged hyperdense subependymal nodule of the posterior right parietal lobe measuring no greater than 0.5 cm (series 2, image 17). Periventricular and deep white matter hypodensity. Vascular: No hyperdense vessel or unexpected calcification. Skull: Normal. Negative for fracture or focal lesion. Sinuses/Orbits: No acute finding. Other: None. CT CERVICAL SPINE FINDINGS Alignment: Normal. Skull base and vertebrae: No acute fracture. No primary bone lesion or focal pathologic process. Soft tissues and spinal canal: No prevertebral fluid or swelling. No visible canal hematoma. Disc levels: Focally moderate disc space height loss and osteophytosis of C5-C6 and mild disc space height loss and osteophytosis of C6-C7, with otherwise preserved disc spaces. Upper chest: Please see separately reported examination of the chest. Other: None. IMPRESSION: 1. No acute intracranial pathology. Small-vessel white matter disease. 2. Unchanged hyperdense subependymal nodule of the posterior right parietal lobe measuring no greater than 0.5 cm. Differential considerations again include a small hematoma, hamartoma, or cavernoma and as previously  reported, could be further evaluated by contrast enhanced MRI. 3. No fracture or static subluxation of the cervical spine. Electronically Signed   By: Delanna Ahmadi M.D.   On: 09/10/2021 13:02   CT HEAD WO CONTRAST (5MM)  Result Date: 09/04/2021 CLINICAL DATA:  Minor head trauma. EXAM: CT HEAD WITHOUT CONTRAST TECHNIQUE: Contiguous axial images were obtained from the base of the skull through the vertex without intravenous contrast. RADIATION DOSE REDUCTION: This exam was performed according to the departmental dose-optimization program which includes automated exposure control, adjustment of the mA and/or kV according to patient size and/or use of iterative reconstruction technique. COMPARISON:  08/15/2021 FINDINGS: Brain: 1.1 by 0.6 by 0.6 cm subependymal hyperdensity along the right parietal lobe, image 17 series 3, no change from 08/15/2021. Internal density about 60 Hounsfield units. Periventricular white matter and corona radiata hypodensities favor chronic ischemic microvascular white matter disease. Otherwise, the brainstem, cerebellum, cerebral peduncles, thalamus, basal ganglia, basilar cisterns, and ventricular system appear within normal limits. No acute CVA identified. Vascular: Unremarkable Skull: Unremarkable Sinuses/Orbits: Prior ethmoidectomies and maxillary antrostomies. The sphenoid sinuses have been unroofed. No current substantial paranasal sinusitis. Other: No supplemental non-categorized findings. IMPRESSION: 1. No change in the right subependymal nodule along the right parietal lobe, which measures about 60 Hounsfield units. Possibilities may include subependymal hamartoma, cavernoma, or recent hemorrhage. If the patient is cooperative with further imaging, consider MRI for further characterization. 2. Periventricular white matter and corona radiata hypodensities favor chronic ischemic microvascular  white matter disease. 3. Postoperative findings in the paranasal sinuses. Electronically  Signed   By: Van Clines M.D.   On: 09/04/2021 09:36   CT Angio Chest PE W and/or Wo Contrast  Result Date: 09/10/2021 CLINICAL DATA:  Chest pain, weakness, recent fall EXAM: CT ANGIOGRAPHY CHEST WITH CONTRAST TECHNIQUE: Multidetector CT imaging of the chest was performed using the standard protocol during bolus administration of intravenous contrast. Multiplanar CT image reconstructions and MIPs were obtained to evaluate the vascular anatomy. RADIATION DOSE REDUCTION: This exam was performed according to the departmental dose-optimization program which includes automated exposure control, adjustment of the mA and/or kV according to patient size and/or use of iterative reconstruction technique. CONTRAST:  30m OMNIPAQUE IOHEXOL 350 MG/ML SOLN COMPARISON:  None. FINDINGS: Cardiovascular: Satisfactory opacification of the pulmonary arteries to the segmental level. No evidence of pulmonary embolism. Normal heart size. Left and right coronary artery calcifications. No pericardial effusion. Aortic atherosclerosis. Mediastinum/Nodes: No enlarged mediastinal, hilar, or axillary lymph nodes. Thyroid gland, trachea, and esophagus demonstrate no significant findings. Lungs/Pleura: Large, loculated appearing left pleural effusion with near-total atelectasis of the left lung. Moderate centrilobular and paraseptal emphysema. Evaluation of the aerated portions of the right lung is limited by breath motion artifact, however within this limitation, there is dependent heterogeneous airspace opacity superimposed upon emphysema (series 7, image 63). Upper Abdomen: Please see separately reported examination of the abdomen and pelvis. Musculoskeletal: No chest wall abnormality. Subacute appearing, partially callused fractures of the posterolateral left ninth and tenth ribs (series 6, image 313, 334). Review of the MIP images confirms the above findings. IMPRESSION: 1. Negative examination for pulmonary embolism. 2. Large,  loculated appearing left pleural effusion with near-total atelectasis of the left lung. 3. Subacute appearing, partially callused fractures of the posterolateral left ninth and tenth ribs. 4. Evaluation of the aerated portions of the right lung is limited by breath motion artifact, however within this limitation, there is dependent heterogeneous airspace opacity superimposed upon emphysema. Findings are consistent with infection or aspiration. 5. Coronary artery disease. Aortic Atherosclerosis (ICD10-I70.0) and Emphysema (ICD10-J43.9). Electronically Signed   By: ADelanna AhmadiM.D.   On: 09/10/2021 13:17   CT Cervical Spine Wo Contrast  Result Date: 09/10/2021 CLINICAL DATA:  Unwitnessed fall, found down EXAM: CT HEAD WITHOUT CONTRAST CT CERVICAL SPINE WITHOUT CONTRAST TECHNIQUE: Multidetector CT imaging of the head and cervical spine was performed following the standard protocol without intravenous contrast. Multiplanar CT image reconstructions of the cervical spine were also generated. RADIATION DOSE REDUCTION: This exam was performed according to the departmental dose-optimization program which includes automated exposure control, adjustment of the mA and/or kV according to patient size and/or use of iterative reconstruction technique. COMPARISON:  09/04/2021 FINDINGS: CT HEAD FINDINGS Brain: No definite evidence of acute infarction, hemorrhage, hydrocephalus, extra-axial collection or mass lesion/mass effect. Unchanged hyperdense subependymal nodule of the posterior right parietal lobe measuring no greater than 0.5 cm (series 2, image 17). Periventricular and deep white matter hypodensity. Vascular: No hyperdense vessel or unexpected calcification. Skull: Normal. Negative for fracture or focal lesion. Sinuses/Orbits: No acute finding. Other: None. CT CERVICAL SPINE FINDINGS Alignment: Normal. Skull base and vertebrae: No acute fracture. No primary bone lesion or focal pathologic process. Soft tissues and  spinal canal: No prevertebral fluid or swelling. No visible canal hematoma. Disc levels: Focally moderate disc space height loss and osteophytosis of C5-C6 and mild disc space height loss and osteophytosis of C6-C7, with otherwise preserved disc spaces. Upper chest: Please see separately reported  examination of the chest. Other: None. IMPRESSION: 1. No acute intracranial pathology. Small-vessel white matter disease. 2. Unchanged hyperdense subependymal nodule of the posterior right parietal lobe measuring no greater than 0.5 cm. Differential considerations again include a small hematoma, hamartoma, or cavernoma and as previously reported, could be further evaluated by contrast enhanced MRI. 3. No fracture or static subluxation of the cervical spine. Electronically Signed   By: Delanna Ahmadi M.D.   On: 09/10/2021 13:02   CT ABDOMEN PELVIS W CONTRAST  Result Date: 09/10/2021 CLINICAL DATA:  Abdominal pain, weakness EXAM: CT ABDOMEN AND PELVIS WITH CONTRAST TECHNIQUE: Multidetector CT imaging of the abdomen and pelvis was performed using the standard protocol following bolus administration of intravenous contrast. RADIATION DOSE REDUCTION: This exam was performed according to the departmental dose-optimization program which includes automated exposure control, adjustment of the mA and/or kV according to patient size and/or use of iterative reconstruction technique. CONTRAST:  59m OMNIPAQUE IOHEXOL 350 MG/ML SOLN COMPARISON:  CT lumbar spine, 08/18/2021, CT abdomen pelvis, 04/08/2019 FINDINGS: Lower chest: Please see separately reported examination of the chest. Hepatobiliary: No solid liver abnormality is seen. No gallstones, gallbladder wall thickening, or biliary dilatation. Pancreas: Unremarkable. No pancreatic ductal dilatation or surrounding inflammatory changes. Spleen: Normal in size without significant abnormality. Adrenals/Urinary Tract: Adrenal glands are unremarkable. Kidneys are normal, without renal  calculi, solid lesion, or hydronephrosis. Bladder is unremarkable. Stomach/Bowel: Stomach is within normal limits. Appendix is not clearly visualized. No evidence of bowel wall thickening, distention, or inflammatory changes. Vascular/Lymphatic: Aortic atherosclerosis. No enlarged abdominal or pelvic lymph nodes. Reproductive: No mass or other significant abnormality. Other: No abdominal wall hernia or abnormality. No ascites. Musculoskeletal: Unchanged superior endplate wedge deformities of T12 and L1, with a high-grade vertebral plana deformity of L4 (series 8, image 69). IMPRESSION: 1. No acute CT findings of the abdomen or pelvis to explain abdominal pain. 2. Unchanged superior endplate wedge deformities of T12 and L1, with a high-grade vertebral plana deformity of L4. No new fracture. Aortic Atherosclerosis (ICD10-I70.0). Electronically Signed   By: ADelanna AhmadiM.D.   On: 09/10/2021 13:10   DG Chest Port 1 View  Result Date: 09/20/2021 CLINICAL DATA:  Intermittent shortness of breath. Two left-sided chest tubes removed over the past 3-4 days. EXAM: PORTABLE CHEST 1 VIEW COMPARISON:  09/18/2021, 09/17/2021, 09/16/2021 FINDINGS: Cardiac silhouette and mediastinal contours are within normal limits. The prior lateral left chest wall subcutaneous air is not visualized on the current study. Cardiac silhouette and mediastinal contours are within normal limits. Tiny left apical pneumothorax appears slightly decreased in size measuring approximally 5 mm in craniocaudal dimension compared to 10 mm on 09/18/2021. Mild left pleural effusion with left basilar heterogeneous and linear opacities, similar to prior. No right pleural effusion. Superior lucent emphysematous changes and chronic superior right lung reticular opacity and interstitial thickening unchanged. No acute skeletal abnormality. IMPRESSION:: IMPRESSION: 1. Mild interval decrease in minimal left apical pneumothorax. 2. Unchanged small left pleural  effusion with left basilar heterogeneous opacity, atelectasis versus pneumonia. 3. Chronic superior right upper lung interstitial thickening and reticular opacities. Electronically Signed   By: RYvonne KendallM.D.   On: 09/20/2021 11:38   DG Chest Port 1 View  Result Date: 09/17/2021 CLINICAL DATA:  Follow-up, left lung empyema. EXAM: PORTABLE CHEST 1 VIEW COMPARISON:  09/16/2021 and older exams.  CT, 09/10/2021. FINDINGS: Small left pneumothorax, without significant change from the previous day's study. Left lung base opacity is also stable consistent with atelectasis and/or residual infection.  Heterogeneous bilateral areas of interstitial thickening are unchanged consistent with fibrosis. No new lung abnormalities. Left chest tube is stable, tip along the medial left upper hemithorax. Small amount of subcutaneous air now evident along the lateral left chest wall, new since the previous day's study. IMPRESSION: 1. Small amount of left lateral chest wall subcutaneous emphysema, new since the prior study. No other change from the previous day's study, allowing for differences in patient positioning and radiographic technique. 2. Persistent small left pneumothorax. Stable left lung base opacity. Electronically Signed   By: Lajean Manes M.D.   On: 09/17/2021 08:21   DG CHEST PORT 1 VIEW  Result Date: 09/16/2021 CLINICAL DATA:  Pneumothorax follow-up EXAM: PORTABLE CHEST 1 VIEW COMPARISON:  Yesterday FINDINGS: Small left apical pneumothorax is unchanged. Unchanged residual parenchymal opacity scattered in the bilateral lungs, densest at the left base. Unchanged pleural fluid or thickening laterally at the left chest. Normal heart size. Stable left chest tube positioning. IMPRESSION: 1. Stable left chest tube and small apical pneumothorax. 2. Stable pulmonary infiltrates. Electronically Signed   By: Jorje Guild M.D.   On: 09/16/2021 08:50   DG CHEST PORT 1 VIEW  Result Date: 09/15/2021 CLINICAL DATA:   Pneumothorax, chest tube. EXAM: PORTABLE CHEST 1 VIEW COMPARISON:  Radiograph September 14, 2021 FINDINGS: Unchanged position of the 2 left-sided thoracostomy tubes. Persistent small left apical pneumothorax. The heart size and mediastinal contours are unchanged. Similar diffuse bilateral interstitial and alveolar opacities. No visible pleural effusion. The visualized skeletal structures are unchanged. IMPRESSION: 1. Persistent small left apical pneumothorax. 2. Similar diffuse bilateral interstitial and alveolar opacities. Electronically Signed   By: Dahlia Bailiff M.D.   On: 09/15/2021 07:54   DG CHEST PORT 1 VIEW  Result Date: 09/14/2021 CLINICAL DATA:  Pneumothorax EXAM: PORTABLE CHEST 1 VIEW COMPARISON:  Portable exam 0529 hours compared to 09/13/2021 FINDINGS: Pair of LEFT thoracostomy tubes unchanged. Persistent small LEFT apex pneumothorax. Upper normal heart size. Mediastinal contours and pulmonary vascularity normal. Persistent diffuse BILATERAL pulmonary infiltrates, greatest RIGHT upper lobe. No pleural effusion or acute osseous findings. Bones demineralized. IMPRESSION: Persistent small LEFT apex pneumothorax despite thoracostomy tubes. Electronically Signed   By: Lavonia Dana M.D.   On: 09/14/2021 08:24   DG CHEST PORT 1 VIEW  Result Date: 09/13/2021 CLINICAL DATA:  Encounter for pneumothorax EXAM: PORTABLE CHEST 1 VIEW COMPARISON:  Chest radiograph dated September 12, 2021 FINDINGS: Left sided chest tube and small left pneumothorax is unchanged. Interval removal of the endotracheal and feeding tubes. Asymmetric hazy opacities in the right upper lobe are unchanged. Left basilar atelectasis and/or small effusion are also unchanged. No other significant interval change. IMPRESSION: 1. Interval removal of the endotracheal and feeding tubes. 2. Left sided chest tube and small left apical pneumothorax unchanged. 3. Right lung opacities as well as left basilar atelectasis are also unchanged.  Electronically Signed   By: Keane Police D.O.   On: 09/13/2021 08:22   DG Chest Port 1 View  Result Date: 09/12/2021 CLINICAL DATA:  Encounter for pneumothorax. EXAM: PORTABLE CHEST 1 VIEW COMPARISON:  One-view chest x-ray 09/11/2021. CTA chest 09/10/2021. FINDINGS: A left-sided chest tube remains in place. Minimal left apical pneumothorax is present. Small left effusion and basilar atelectasis is present. Endotracheal tube and NG tube are stable. Asymmetric right-sided airspace disease is again noted. The right pleural effusion and basilar atelectasis is improved. IMPRESSION: 1. Minimal left apical pneumothorax with left-sided chest tube in place. 2. Small left pleural effusion and basilar  atelectasis. 3. Improved right pleural effusion and basilar atelectasis. 4. Persistent and increasing right upper lobe airspace opacities concerning for asymmetric edema or infection. Electronically Signed   By: San Morelle M.D.   On: 09/12/2021 08:30   DG CHEST PORT 1 VIEW  Result Date: 09/11/2021 CLINICAL DATA:  Left pleural effusion EXAM: PORTABLE CHEST 1 VIEW COMPARISON:  Previous studies including the examination of 09/10/2021 FINDINGS: Cardiac size is within normal limits. Tip of endotracheal tube is approximately 7.4 cm above the carina. Enteric tube is noted traversing the esophagus with its distal portion in the stomach. There is interval placement of left chest tube with its tip in the medial left lower lung fields with almost complete clearing of left pleural effusion. There is another left chest tube with its tip in the medial left upper lung fields. There is small left apical pneumothorax. There is possible minimal pneumothorax in the lateral aspect of left lower lung fields. New patchy infiltrates are seen in the right parahilar region and right lower lung fields. There is blunting of right lateral CP angle. IMPRESSION: There is almost complete resolution of large left pleural effusion after  placement of 2 left chest tubes. Small left pneumothorax. New patchy infiltrates are seen in the right parahilar region and right lower lung fields suggesting pneumonia or asymmetric pulmonary edema. Blunting of right lateral CP angle suggests small right pleural effusion. Other findings as described in the body of the report. Electronically Signed   By: Elmer Picker M.D.   On: 09/11/2021 16:40   DG Chest Portable 1 View  Result Date: 09/10/2021 CLINICAL DATA:  Fall. EXAM: PORTABLE CHEST 1 VIEW COMPARISON:  08/15/2021 FINDINGS: Interval development of large left pleural effusion with left base collapse/consolidation. Heart is enlarged. Interstitial markings are diffusely coarsened with chronic features. Gaseous distention of bowel loops noted in the upper abdomen with probable gas distended colon under the right hemidiaphragm although intraperitoneal free air cannot be entirely excluded. IMPRESSION: 1. Interval development of large left pleural effusion with left base collapse/consolidation. Given the unilateral involvement, chest CT with contrast may prove helpful to further evaluate. 2. Cardiomegaly. 3. Gaseous distention of bowel loops in the upper abdomen, incompletely assessed. Lucency under the right hemidiaphragm likely secondary to gas in the hepatic flexure but intraperitoneal free air cannot be excluded. CT abdomen/pelvis could be used to further evaluate as clinically warranted. Electronically Signed   By: Misty Stanley M.D.   On: 09/10/2021 12:04   DG Knee Complete 4 Views Left  Result Date: 09/10/2021 CLINICAL DATA:  Fall. EXAM: LEFT KNEE - COMPLETE 4+ VIEW COMPARISON:  None. FINDINGS: No evidence of fracture, dislocation, or joint effusion. No evidence of arthropathy or other focal bone abnormality. Soft tissues are unremarkable. IMPRESSION: Negative. Electronically Signed   By: Misty Stanley M.D.   On: 09/10/2021 12:01   DG Hip Unilat W or Wo Pelvis 2-3 Views Left  Result Date:  09/10/2021 CLINICAL DATA:  71 year old male status post fall. Found down. Pain and weakness. EXAM: DG HIP (WITH OR WITHOUT PELVIS) 2-3V LEFT COMPARISON:  Left hip series 08/24/2021. FINDINGS: Femoral heads remain normally located. Pelvis appears stable and intact. Grossly intact proximal right femur. Calcified iliofemoral atherosclerosis. Proximal left femur appears stable and intact. No acute osseous abnormality identified. Visible bowel-gas pattern within normal limits. IMPRESSION: No acute fracture or dislocation identified about the left hip or pelvis. Electronically Signed   By: Genevie Ann M.D.   On: 09/10/2021 12:02  Subjective:  No Chest pain, no SOB, he reports generalized weakness and fatigue.  Discharge Exam: Vitals:   09/26/21 0729 09/26/21 1130  BP: 118/83 109/75  Pulse: 76 (!) 107  Resp: 16 19  Temp: 98.1 F (36.7 C) 97.6 F (36.4 C)  SpO2: 93% 93%   Vitals:   09/26/21 0400 09/26/21 0500 09/26/21 0729 09/26/21 1130  BP: 114/75  118/83 109/75  Pulse: 90  76 (!) 107  Resp: '17  16 19  '$ Temp: 98.1 F (36.7 C)  98.1 F (36.7 C) 97.6 F (36.4 C)  TempSrc: Oral  Oral Oral  SpO2: 98%  93% 93%  Weight:  77.3 kg    Height:        General: Pt is alert, awake, not in acute distress Cardiovascular: RRR, S1/S2 +, no rubs, no gallops Respiratory: CTA bilaterally, no wheezing, no rhonchi Abdominal: Soft, NT, ND, bowel sounds + Extremities: no edema, no cyanosis    The results of significant diagnostics from this hospitalization (including imaging, microbiology, ancillary and laboratory) are listed below for reference.     Microbiology: No results found for this or any previous visit (from the past 240 hour(s)).   Labs: BNP (last 3 results) Recent Labs    09/20/21 0251 09/21/21 0447 09/22/21 0145  BNP 53.0 28.3 02.5   Basic Metabolic Panel: Recent Labs  Lab 09/20/21 0251 09/21/21 0447 09/22/21 0145 09/23/21 0107 09/25/21 0148 09/26/21 0158  NA 136 134*  133*  --  131* 131*  K 4.4 4.5 3.8  --  4.0 4.4  CL 98 98 98  --  97* 96*  CO2 '31 31 27  '$ --  24 28  GLUCOSE 87 102* 132*  --  133* 104*  BUN <5* <5* 5*  --  6* <5*  CREATININE 0.45* 0.52* 0.44*  --  0.44* 0.47*  CALCIUM 8.4* 8.3* 8.4*  --  8.4* 8.6*  MG 1.6* 1.8 1.6* 1.9 1.6* 2.0   Liver Function Tests: Recent Labs  Lab 09/20/21 0251 09/21/21 0447 09/22/21 0145  AST '24 20 20  '$ ALT '15 16 16  '$ ALKPHOS 62 63 66  BILITOT 0.4 0.2* 0.4  PROT 5.6* 5.6* 5.3*  ALBUMIN 1.9* 2.0* 1.9*   No results for input(s): LIPASE, AMYLASE in the last 168 hours. No results for input(s): AMMONIA in the last 168 hours. CBC: Recent Labs  Lab 09/20/21 0251 09/21/21 0447 09/22/21 0145 09/25/21 0148  WBC 12.0* 12.6* 11.9* 9.2  NEUTROABS 8.4* 9.1* 8.3*  --   HGB 11.9* 11.6* 11.3* 11.3*  HCT 35.6* 35.1* 33.6* 34.0*  MCV 94.2 94.4 93.9 93.2  PLT 539* 548* 502* 437*   Cardiac Enzymes: No results for input(s): CKTOTAL, CKMB, CKMBINDEX, TROPONINI in the last 168 hours. BNP: Invalid input(s): POCBNP CBG: No results for input(s): GLUCAP in the last 168 hours. D-Dimer No results for input(s): DDIMER in the last 72 hours. Hgb A1c No results for input(s): HGBA1C in the last 72 hours. Lipid Profile No results for input(s): CHOL, HDL, LDLCALC, TRIG, CHOLHDL, LDLDIRECT in the last 72 hours. Thyroid function studies No results for input(s): TSH, T4TOTAL, T3FREE, THYROIDAB in the last 72 hours.  Invalid input(s): FREET3 Anemia work up No results for input(s): VITAMINB12, FOLATE, FERRITIN, TIBC, IRON, RETICCTPCT in the last 72 hours. Urinalysis    Component Value Date/Time   COLORURINE AMBER (A) 09/10/2021 1324   APPEARANCEUR HAZY (A) 09/10/2021 1324   LABSPEC 1.020 09/10/2021 1324   PHURINE 5.0 09/10/2021 1324   GLUCOSEU  NEGATIVE 09/10/2021 1324   HGBUR SMALL (A) 09/10/2021 1324   BILIRUBINUR NEGATIVE 09/10/2021 1324   KETONESUR 5 (A) 09/10/2021 1324   PROTEINUR NEGATIVE 09/10/2021 1324    NITRITE NEGATIVE 09/10/2021 1324   LEUKOCYTESUR NEGATIVE 09/10/2021 1324   Sepsis Labs Invalid input(s): PROCALCITONIN,  WBC,  LACTICIDVEN Microbiology No results found for this or any previous visit (from the past 240 hour(s)).   Time coordinating discharge: Over 30 minutes  SIGNED:   Phillips Climes, MD  [facility] 09/26/2021, 12:33 PM Pager   If 7PM-7AM, please contact night-coverage [email or web address] Password [email or web address]

## 2021-09-26 NOTE — Plan of Care (Signed)
?  Problem: Clinical Measurements: ?Goal: Ability to maintain clinical measurements within normal limits will improve ?Outcome: Progressing ?Goal: Will remain free from infection ?Outcome: Progressing ?Goal: Diagnostic test results will improve ?Outcome: Progressing ?Goal: Respiratory complications will improve ?Outcome: Progressing ?Goal: Cardiovascular complication will be avoided ?Outcome: Progressing ?  ?Problem: Clinical Measurements: ?Goal: Will remain free from infection ?Outcome: Progressing ?  ?Problem: Clinical Measurements: ?Goal: Diagnostic test results will improve ?Outcome: Progressing ?  ?

## 2021-09-26 NOTE — Progress Notes (Signed)
Occupational Therapy Treatment ?Patient Details ?Name: Lance House ?MRN: [id number] ?DOB: [birth year] ?Today's Date: 09/26/2021 ? ? ?History of present illness Pt is a 71 y/o male presenting 2/19 with SOB, weakness, L sided chest pain for several weeks. Found hypoxic, septic shock with L sided empyema.  Recent admission for traumatic ICH after fall and dc'd to SNF but left AMA. S/P VATS 2/20. PMH includes: ETOH, depression, compression fractures. ?  ?OT comments ? Pt is making steady progress with OT at this time, but is still limited by pain and endurance.  Overall, he continues to need min to min guard assist for transfers using the RW for support as well as for selfcare tasks sit to stand.  Will continue to recommend acute care OT services at this time to continue progression toward new established supervision level goals.  Would recommend SNF for continued rehab at this time.   ? ?Recommendations for follow up therapy are one component of a multi-disciplinary discharge planning process, led by the attending physician.  Recommendations may be updated based on patient status, additional functional criteria and insurance authorization. ?   ?Follow Up Recommendations ? Skilled nursing-short term rehab (<3 hours/day)  ?  ?Assistance Recommended at Discharge Frequent or constant Supervision/Assistance  ?Patient can return home with the following ? A little help with walking and/or transfers;A little help with bathing/dressing/bathroom ?  ?Equipment Recommendations ? BSC/3in1  ?  ?Recommendations for Other Services   ? ?  ?Precautions / Restrictions Precautions ?Precautions: Fall ?Restrictions ?Weight Bearing Restrictions: No  ? ? ?  ? ?Mobility Bed Mobility ?Overal bed mobility: Needs Assistance ?Bed Mobility: Supine to Sit, Sit to Supine ?  ?  ?Supine to sit: Modified independent (Device/Increase time) ?Sit to supine: Modified independent (Device/Increase time) ?  ?  ?  ? ?Transfers ?Overall transfer level: Needs  assistance ?Equipment used: Rolling walker (2 wheels) ?Transfers: Sit to/from Stand ?Sit to Stand: Min guard ?  ?  ?Step pivot transfers: Min assist, +2 safety/equipment ?  ?  ?  ?  ?  ?Balance Overall balance assessment: Needs assistance ?Sitting-balance support: No upper extremity supported ?Sitting balance-Leahy Scale: Normal ?  ?  ?Standing balance support: During functional activity ?Standing balance-Leahy Scale: Poor ?Standing balance comment: Pt needs UE support on the RW for standing ?  ?  ?  ?  ?  ?  ?  ?  ?  ?  ?  ?   ? ?ADL either performed or assessed with clinical judgement  ? ?ADL Overall ADL's : Needs assistance/impaired ?  ?  ?  ?  ?  ?  ?  ?  ?  ?  ?Lower Body Dressing: Supervision/safety ?Lower Body Dressing Details (indicate cue type and reason): Pt adjusted socks while in long sitting in the bed ?Toilet Transfer: Minimal assistance;Rolling walker (2 wheels) ?Toilet Transfer Details (indicate cue type and reason): simulated with RW, pt declined need to toilet this session ?  ?  ?  ?  ?Functional mobility during ADLs: Minimal assistance;Rolling walker (2 wheels) ?General ADL Comments: Pt with increased trunk flexion in standing and with mobility.  Increased external rotation of the LEs while ambulating as well with the LLE being more rotated than the right. ?  ? ? ?   ?   ?   ? ?Cognition Arousal/Alertness: Awake/alert ?Behavior During Therapy: [facility] for tasks assessed/performed ?Overall Cognitive Status: Impaired/Different from baseline ?Area of Impairment: Awareness ?  ?  ?  ?  ?  ?  ?  ?  ?  ?  Current Attention Level: Sustained ?  ?Following Commands: Follows one step commands consistently ?Safety/Judgement:  (decreased anticipatory awareness) ?Awareness: Intellectual ?Problem Solving: Requires tactile cues, Requires verbal cues ?General Comments: Pt reports getting up earlier late last night and standing from the side of the bed and walking in the room a few steps without anyone in the room.   Unsure accuracy of this statement though.  Mod instructional cueing for participation in OT session secondary to just getting his pain meds per his report and wanting time to have them work. ?  ?  ?   ?   ?   ?   ? ? ?Pertinent Vitals/ Pain       Pain Assessment ?Pain Assessment: 0-10 ?Pain Score: 10-Worst pain ever ?Pain Location: pt reports pain in his right abdomen and back ?Pain Descriptors / Indicators: Discomfort, Sore ?Pain Intervention(s): Limited activity within patient's tolerance, Monitored during session ? ?   ?   ? ?Frequency ? Min 2X/week  ? ? ? ? ?  ?Progress Toward Goals ? ?OT Goals(current goals can now be found in the care plan section) ? Progress towards OT goals: Goals met and updated - see care plan ? ?Acute Rehab OT Goals ?Patient Stated Goal: Get his legs stronger ?OT Goal Formulation: With patient ?Time For Goal Achievement: 10/10/21 ?Potential to Achieve Goals: Fair  ?Plan Discharge plan needs to be updated   ? ?Co-evaluation ? ? ? PT/OT/SLP Co-Evaluation/Treatment: Yes ?Reason for Co-Treatment: For patient/therapist safety;To address functional/ADL transfers ?  ?OT goals addressed during session: ADL's and self-care;Strengthening/ROM ?  ? ?  ?AM-PAC OT "6 Clicks" Daily Activity     ?Outcome Measure ? ? Help from another person eating meals?: None ?Help from another person taking care of personal grooming?: A Little ?Help from another person toileting, which includes using toliet, bedpan, or urinal?: A Little ?Help from another person bathing (including washing, rinsing, drying)?: A Little ?Help from another person to put on and taking off regular upper body clothing?: A Little ?Help from another person to put on and taking off regular lower body clothing?: A Little ?6 Click Score: 19 ? ?  ?End of Session Equipment Utilized During Treatment: Rolling walker (2 wheels);Gait belt ? ?OT Visit Diagnosis: Other abnormalities of gait and mobility (R26.89);Muscle weakness (generalized)  (M62.81);Pain;Other symptoms and signs involving cognitive function;History of falling (Z91.81);Unsteadiness on feet (R26.81) ?Pain - Right/Left:  (back, left ribs/abdomen) ?  ?Activity Tolerance Patient tolerated treatment well ?  ?Patient Left in bed;with call bell/phone within reach;with bed alarm set ?  ?Nurse Communication Mobility status ?  ? ?   ? ?Time: 1916-6060 ?OT Time Calculation (min): 34 min ? ?Charges: OT General Charges ?$OT Visit: 1 Visit ?OT Treatments ?$Therapeutic Activity: 8-22 mins ? ? ? ?Zayra Devito .xx ?09/26/2021, 11:07 AM ?

## 2021-09-26 NOTE — [place] FL2 (Signed)
?[place] MEDICAID FL2 LEVEL OF CARE SCREENING TOOL  ?  ? ?IDENTIFICATION  ?Patient Name: ?[facility] Birthdate: 07/26/1950 Sex: male Admission Date (Current Location): ?09/10/2021  ?[place] and [place] Number: ?  ?  Facility and Address:  ?[facility] [place] [facility] [place] [place] [address], [place], [place] [address] ?     Provider Number: ?[id number]  ?Attending Physician Name and Address:  ?Elgergawy, Silver Huguenin, MD ? Relative Name and Phone Number:  ?Mills Mitton [phone] ?   ?Current Level of Care: ?Hospital Recommended Level of Care: ?Between Prior Approval Number: ?  ? ?Date Approved/Denied: ?  PASRR Number: ?[id number] F expires 10/26/21 ? ?Discharge Plan: ?SNF ?  ? ?Current Diagnoses: ?Patient Active Problem List  ? Diagnosis Date Noted  ? Pressure injury of skin 09/11/2021  ? Empyema ([place]) 09/10/2021  ? Compression fracture of fourth lumbar vertebra (HCC)   ? Elevated troponin   ? Pain   ? Alcoholic intoxication without complication ([place])   ? Fall   ? Compression fracture of thoracic vertebra ([place]) 08/18/2021  ? Compression fracture of L4 lumbar vertebra, closed, initial encounter ([place]) 08/18/2021  ? Hyponatremia 08/16/2021  ? Hypokalemia 08/16/2021  ? Rhabdomyolysis 08/16/2021  ? ICH (intracerebral hemorrhage) ([place]) 08/15/2021  ? S/P inguinal hernia repair 04/13/2019  ? Incarcerated left inguinal hernia   ? Abdominal pain 04/08/2019  ? Hernia 08/13/2013  ? Tobacco use disorder 08/13/2013  ? Chronic sinusitis 02/26/2013  ? Dyspnea 02/26/2013  ? Anxiety 02/03/2013  ? Left flank pain 02/03/2013  ? ? ?Orientation RESPIRATION BLADDER Height & Weight   ?  ?Self, Time, Situation, Place ? O2 (2L nasal cannula) Incontinent Weight: 170 lb 6.7 oz (77.3 kg) ?Height:  '6\' 1"'$  (185.4 cm)  ?BEHAVIORAL SYMPTOMS/MOOD NEUROLOGICAL BOWEL NUTRITION STATUS  ?    Incontinent Diet (see dc summary)  ?AMBULATORY STATUS COMMUNICATION OF NEEDS Skin   ?Limited Assist Verbally PU Stage and Appropriate Care  (Stage II on coccyx w/foam dressing) ?  ?  ?  ?    ?     ?     ? ? ?Personal Care Assistance Level of Assistance  ?  Bathing Assistance: Limited assistance ?Feeding assistance: Independent ?Dressing Assistance: Maximum assistance ?   ? ?Functional Limitations Info  ?Hearing Sight Info: Impaired ?Hearing Info: Impaired ?Speech Info: Adequate  ? ? ?SPECIAL CARE FACTORS FREQUENCY  ?PT (By licensed PT), OT (By licensed OT)   ?  ?PT Frequency: 5x/week ?OT Frequency: 5x/week ?  ?  ?  ?   ? ? ?Contractures Contractures Info: Not present  ? ? ?Additional Factors Info  ?Code Status, Allergies Code Status Info: Full ?Allergies Info: Ciprocin-fluocin-procin (Fluocinolone), Ciprofloxacin, Colchicine, Duloxetine ?  ?  ?  ?   ? ?Current Medications (09/26/2021):  This is the current hospital active medication list ?Current Facility-Administered Medications  ?Medication Dose Route Frequency Provider Last Rate Last Admin  ? (feeding supplement) PROSource Plus liquid 30 mL  30 mL Oral BID BM Thurnell Lose, MD   30 mL at 09/25/21 5631  ? 0.9 %  sodium chloride infusion   Intravenous PRN Thurnell Lose, MD      ? albuterol (PROVENTIL) (2.5 MG/3ML) 0.083% nebulizer solution 2.5 mg  2.5 mg Nebulization Q2H PRN Lars Pinks M, PA-C      ? allopurinol (ZYLOPRIM) tablet 300 mg  300 mg Oral Daily Thurnell Lose, MD      ? alum & mag hydroxide-simeth (MAALOX/MYLANTA) 200-200-20 MG/5ML suspension 30  mL  30 mL Oral Once Shalhoub, Sherryll Burger, MD      ? bethanechol (URECHOLINE) tablet 5 mg  5 mg Oral TID Jacky Kindle, MD   5 mg at 09/25/21 2123  ? bisacodyl (DULCOLAX) EC tablet 10 mg  10 mg Oral Daily Lars Pinks M, PA-C   10 mg at 09/14/21 1027  ? cefTRIAXone (ROCEPHIN) 2 g in sodium chloride 0.9 % 100 mL IVPB  2 g Intravenous Q24H Derrill Kay A, MD 200 mL/hr at 09/26/21 1122 2 g at 09/26/21 1122  ? clonazePAM (KLONOPIN) tablet 0.5 mg  0.5 mg Oral BID PRN Jose Persia, MD   0.5 mg at 09/26/21 4540  ? docusate sodium  (COLACE) capsule 100 mg  100 mg Oral BID Jacky Kindle, MD   100 mg at 09/14/21 2023  ? folic acid (FOLVITE) tablet 1 mg  1 mg Oral Daily Jose Persia, MD   1 mg at 09/25/21 9811  ? furosemide (LASIX) injection 40 mg  40 mg Intravenous Once Thurnell Lose, MD      ? gabapentin (NEURONTIN) capsule 400 mg  400 mg Oral TID Melrose Nakayama, MD   400 mg at 09/26/21 9147  ? guaiFENesin-dextromethorphan (ROBITUSSIN DM) 100-10 MG/5ML syrup 10 mL  10 mL Oral Q4H PRN Anders Simmonds, MD   10 mL at 09/26/21 1118  ? heparin injection 5,000 Units  5,000 Units Subcutaneous Q8H Nani Skillern, PA-C   5,000 Units at 09/26/21 0544  ? ipratropium-albuterol (DUONEB) 0.5-2.5 (3) MG/3ML nebulizer solution 3 mL  3 mL Nebulization Q4H PRN Lars Pinks M, PA-C      ? lactose free nutrition (Boost) liquid 237 mL  237 mL Oral TID WC Dahlia Byes, MD   237 mL at 09/26/21 1123  ? lidocaine (LIDODERM) 5 % 1 patch  1 patch Transdermal Q24H Jacky Kindle, MD   1 patch at 09/26/21 1117  ? MEDLINE mouth rinse  15 mL Mouth Rinse BID Jacky Kindle, MD   15 mL at 09/25/21 2125  ? multivitamin with minerals tablet 1 tablet  1 tablet Oral Daily Jose Persia, MD   1 tablet at 09/25/21 8295  ? ondansetron (ZOFRAN) injection 4 mg  4 mg Intravenous Q6H PRN Nani Skillern, PA-C   4 mg at 09/18/21 0241  ? oxyCODONE (Oxy IR/ROXICODONE) immediate release tablet 10 mg  10 mg Oral Q6H PRN Melrose Nakayama, MD   10 mg at 09/26/21 6213  ? senna-docusate (Senokot-S) tablet 1 tablet  1 tablet Oral QHS Jose Persia, MD   1 tablet at 09/14/21 2023  ? thiamine tablet 100 mg  100 mg Oral Daily Jose Persia, MD   100 mg at 09/25/21 0865  ? ? ? ?Discharge Medications: ?Please see discharge summary for a list of discharge medications. ? ?Relevant Imaging Results: ? ?Relevant Lab Results: ? ? ?Additional Information ?SS: [id number]. No covid vaccines in system ? ?Lissa Morales Helga Asbury, LCSW ? ? ? ? ?

## 2021-09-27 ENCOUNTER — Other Ambulatory Visit: Payer: Self-pay

## 2021-09-27 ENCOUNTER — Emergency Department (HOSPITAL_COMMUNITY): Payer: Medicare HMO

## 2021-09-27 ENCOUNTER — Encounter (HOSPITAL_COMMUNITY): Payer: Self-pay

## 2021-09-27 ENCOUNTER — Emergency Department (HOSPITAL_COMMUNITY)
Admission: EM | Admit: 2021-09-27 | Discharge: 2021-09-27 | Disposition: A | Discharge status: Skilled Nursing Facility | Payer: Medicare HMO | Attending: Emergency Medicine | Admitting: Emergency Medicine

## 2021-09-27 DIAGNOSIS — L8990 Pressure ulcer of unspecified site, unspecified stage: Secondary | ICD-10-CM | POA: Diagnosis not present

## 2021-09-27 DIAGNOSIS — R52 Pain, unspecified: Secondary | ICD-10-CM | POA: Diagnosis not present

## 2021-09-27 DIAGNOSIS — J939 Pneumothorax, unspecified: Secondary | ICD-10-CM | POA: Diagnosis not present

## 2021-09-27 DIAGNOSIS — J869 Pyothorax without fistula: Secondary | ICD-10-CM | POA: Diagnosis not present

## 2021-09-27 DIAGNOSIS — L8995 Pressure ulcer of unspecified site, unstageable: Secondary | ICD-10-CM | POA: Diagnosis not present

## 2021-09-27 DIAGNOSIS — Z720 Tobacco use: Secondary | ICD-10-CM | POA: Diagnosis not present

## 2021-09-27 DIAGNOSIS — K469 Unspecified abdominal hernia without obstruction or gangrene: Secondary | ICD-10-CM | POA: Diagnosis not present

## 2021-09-27 DIAGNOSIS — Z743 Need for continuous supervision: Secondary | ICD-10-CM | POA: Diagnosis not present

## 2021-09-27 DIAGNOSIS — R9389 Abnormal findings on diagnostic imaging of other specified body structures: Secondary | ICD-10-CM | POA: Diagnosis not present

## 2021-09-27 DIAGNOSIS — F331 Major depressive disorder, recurrent, moderate: Secondary | ICD-10-CM | POA: Diagnosis not present

## 2021-09-27 DIAGNOSIS — M6282 Rhabdomyolysis: Secondary | ICD-10-CM | POA: Diagnosis not present

## 2021-09-27 DIAGNOSIS — G8929 Other chronic pain: Secondary | ICD-10-CM | POA: Diagnosis not present

## 2021-09-27 DIAGNOSIS — F419 Anxiety disorder, unspecified: Secondary | ICD-10-CM | POA: Diagnosis not present

## 2021-09-27 DIAGNOSIS — J189 Pneumonia, unspecified organism: Secondary | ICD-10-CM | POA: Diagnosis not present

## 2021-09-27 DIAGNOSIS — R778 Other specified abnormalities of plasma proteins: Secondary | ICD-10-CM | POA: Diagnosis not present

## 2021-09-27 DIAGNOSIS — R2689 Other abnormalities of gait and mobility: Secondary | ICD-10-CM | POA: Diagnosis not present

## 2021-09-27 DIAGNOSIS — M6281 Muscle weakness (generalized): Secondary | ICD-10-CM | POA: Diagnosis not present

## 2021-09-27 DIAGNOSIS — S32040A Wedge compression fracture of fourth lumbar vertebra, initial encounter for closed fracture: Secondary | ICD-10-CM | POA: Diagnosis not present

## 2021-09-27 DIAGNOSIS — R69 Illness, unspecified: Secondary | ICD-10-CM | POA: Diagnosis not present

## 2021-09-27 DIAGNOSIS — R457 State of emotional shock and stress, unspecified: Secondary | ICD-10-CM | POA: Diagnosis not present

## 2021-09-27 DIAGNOSIS — D649 Anemia, unspecified: Secondary | ICD-10-CM | POA: Diagnosis not present

## 2021-09-27 DIAGNOSIS — J90 Pleural effusion, not elsewhere classified: Secondary | ICD-10-CM | POA: Diagnosis not present

## 2021-09-27 DIAGNOSIS — R0600 Dyspnea, unspecified: Secondary | ICD-10-CM | POA: Diagnosis not present

## 2021-09-27 DIAGNOSIS — R0602 Shortness of breath: Secondary | ICD-10-CM | POA: Diagnosis not present

## 2021-09-27 DIAGNOSIS — Z716 Tobacco abuse counseling: Secondary | ICD-10-CM | POA: Diagnosis not present

## 2021-09-27 DIAGNOSIS — R279 Unspecified lack of coordination: Secondary | ICD-10-CM | POA: Diagnosis not present

## 2021-09-27 DIAGNOSIS — J329 Chronic sinusitis, unspecified: Secondary | ICD-10-CM | POA: Diagnosis not present

## 2021-09-27 DIAGNOSIS — I619 Nontraumatic intracerebral hemorrhage, unspecified: Secondary | ICD-10-CM | POA: Diagnosis not present

## 2021-09-27 DIAGNOSIS — J984 Other disorders of lung: Secondary | ICD-10-CM | POA: Diagnosis not present

## 2021-09-27 DIAGNOSIS — R531 Weakness: Secondary | ICD-10-CM | POA: Diagnosis not present

## 2021-09-27 DIAGNOSIS — Z79899 Other long term (current) drug therapy: Secondary | ICD-10-CM | POA: Insufficient documentation

## 2021-09-27 DIAGNOSIS — J439 Emphysema, unspecified: Secondary | ICD-10-CM | POA: Diagnosis not present

## 2021-09-27 DIAGNOSIS — Z8719 Personal history of other diseases of the digestive system: Secondary | ICD-10-CM | POA: Diagnosis not present

## 2021-09-27 DIAGNOSIS — F10920 Alcohol use, unspecified with intoxication, uncomplicated: Secondary | ICD-10-CM | POA: Diagnosis not present

## 2021-09-27 DIAGNOSIS — E871 Hypo-osmolality and hyponatremia: Secondary | ICD-10-CM | POA: Diagnosis not present

## 2021-09-27 DIAGNOSIS — M109 Gout, unspecified: Secondary | ICD-10-CM | POA: Diagnosis not present

## 2021-09-27 DIAGNOSIS — R109 Unspecified abdominal pain: Secondary | ICD-10-CM | POA: Diagnosis not present

## 2021-09-27 DIAGNOSIS — S22000A Wedge compression fracture of unspecified thoracic vertebra, initial encounter for closed fracture: Secondary | ICD-10-CM | POA: Diagnosis not present

## 2021-09-27 DIAGNOSIS — R262 Difficulty in walking, not elsewhere classified: Secondary | ICD-10-CM | POA: Diagnosis not present

## 2021-09-27 DIAGNOSIS — E876 Hypokalemia: Secondary | ICD-10-CM | POA: Diagnosis not present

## 2021-09-27 DIAGNOSIS — F17200 Nicotine dependence, unspecified, uncomplicated: Secondary | ICD-10-CM | POA: Diagnosis not present

## 2021-09-27 DIAGNOSIS — K4030 Unilateral inguinal hernia, with obstruction, without gangrene, not specified as recurrent: Secondary | ICD-10-CM | POA: Diagnosis not present

## 2021-09-27 DIAGNOSIS — W19XXXA Unspecified fall, initial encounter: Secondary | ICD-10-CM | POA: Diagnosis not present

## 2021-09-27 DIAGNOSIS — F1020 Alcohol dependence, uncomplicated: Secondary | ICD-10-CM | POA: Diagnosis not present

## 2021-09-27 DIAGNOSIS — I10 Essential (primary) hypertension: Secondary | ICD-10-CM | POA: Diagnosis not present

## 2021-09-27 DIAGNOSIS — Z7401 Bed confinement status: Secondary | ICD-10-CM | POA: Diagnosis not present

## 2021-09-27 LAB — CBC WITH DIFFERENTIAL/PLATELET
Abs Immature Granulocytes: 0.08 10*3/uL — ABNORMAL HIGH (ref 0.00–0.07)
Basophils Absolute: 0.1 10*3/uL (ref 0.0–0.1)
Basophils Relative: 1 %
Eosinophils Absolute: 0.2 10*3/uL (ref 0.0–0.5)
Eosinophils Relative: 2 %
HCT: 38.4 % — ABNORMAL LOW (ref 39.0–52.0)
Hemoglobin: 12.4 g/dL — ABNORMAL LOW (ref 13.0–17.0)
Immature Granulocytes: 1 %
Lymphocytes Relative: 22 %
Lymphs Abs: 1.6 10*3/uL (ref 0.7–4.0)
MCH: 30.5 pg (ref 26.0–34.0)
MCHC: 32.3 g/dL (ref 30.0–36.0)
MCV: 94.3 fL (ref 80.0–100.0)
Monocytes Absolute: 0.5 10*3/uL (ref 0.1–1.0)
Monocytes Relative: 7 %
Neutro Abs: 4.9 10*3/uL (ref 1.7–7.7)
Neutrophils Relative %: 67 %
Platelets: 398 10*3/uL (ref 150–400)
RBC: 4.07 MIL/uL — ABNORMAL LOW (ref 4.22–5.81)
RDW: 15 % (ref 11.5–15.5)
WBC: 7.3 10*3/uL (ref 4.0–10.5)
nRBC: 0 % (ref 0.0–0.2)

## 2021-09-27 LAB — BASIC METABOLIC PANEL
Anion gap: 7 (ref 5–15)
BUN: <5 mg/dL — ABNORMAL LOW (ref 8–23)
CO2: 25 mmol/L (ref 22–32)
Calcium: 8.6 mg/dL — ABNORMAL LOW (ref 8.9–10.3)
Chloride: 100 mmol/L (ref 98–111)
Creatinine, Ser: 0.4 mg/dL — ABNORMAL LOW (ref 0.61–1.24)
GFR, Estimated: >60 mL/min (ref >60)
Glucose, Bld: 98 mg/dL (ref 70–99)
Potassium: 4 mmol/L (ref 3.5–5.1)
Sodium: 132 mmol/L — ABNORMAL LOW (ref 135–145)

## 2021-09-27 IMAGING — DX DG CHEST 1V PORT
1 series · 1 of 1 positions shown · non-contrast
Comparison: [DATE].

CLINICAL DATA: sob

EXAM:
PORTABLE CHEST 1 VIEW

[chest ap]
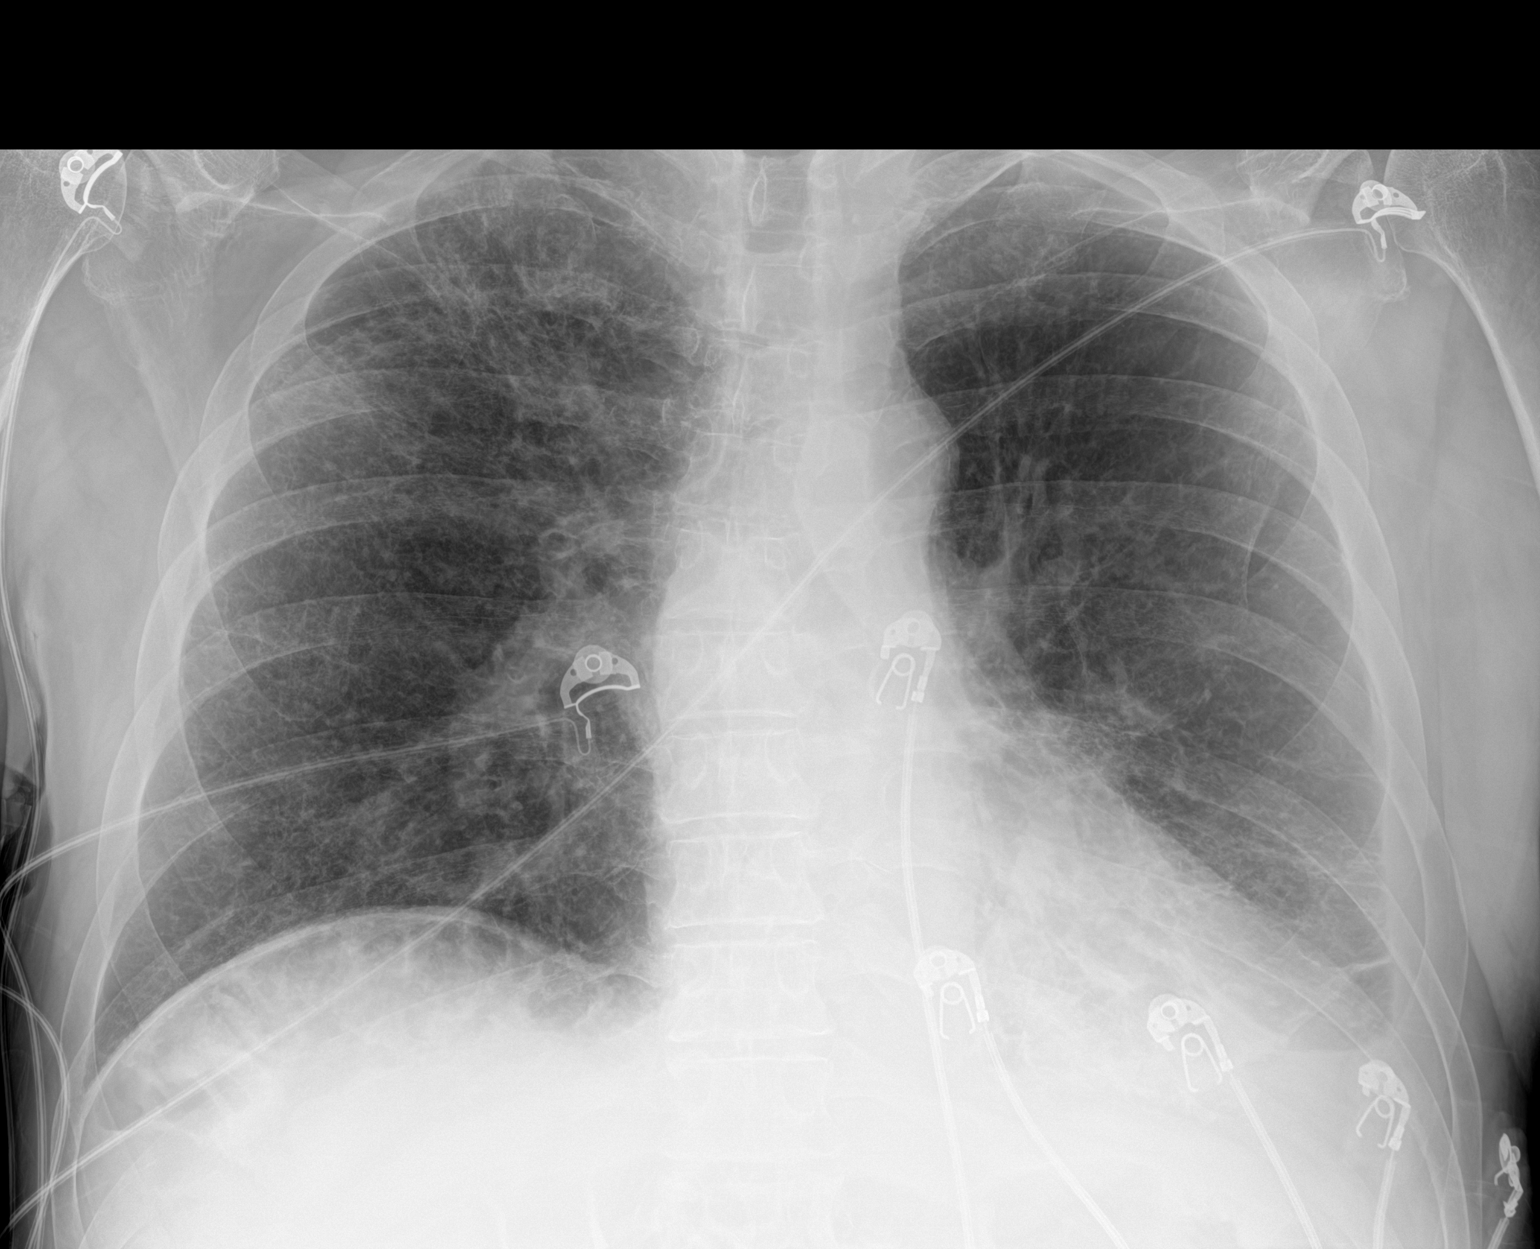

[1 of 1 positions shown; findings below may reference images not displayed]

FINDINGS: Similar small left hydropneumothorax. Right-sided skin fold. Similar
emphysema and bilateral reticular opacities. Similar streaky left
basilar opacities. Similar cardiomediastinal silhouette. No evidence
of acute osseous abnormality.
IMPRESSION: 1. Similar small left hydropneumothorax.
2. Similar emphysema and bilateral reticular opacities.
3. Similar streaky left basilar opacities, which could represent
atelectasis, aspiration, and/or pneumonia.

## 2021-09-27 MED ORDER — FUROSEMIDE 20 MG PO TABS
20.0000 mg | ORAL_TABLET | Freq: Every day | ORAL | Status: DC
Start: 2021-09-27 — End: 2021-09-27
  Administered 2021-09-27: 20 mg via ORAL
  Filled 2021-09-27: qty 1

## 2021-09-27 MED ORDER — DOCUSATE SODIUM 100 MG PO CAPS
200.0000 mg | ORAL_CAPSULE | Freq: Every day | ORAL | Status: DC
  Administered 2021-09-27: 200 mg via ORAL
  Filled 2021-09-27: qty 2

## 2021-09-27 MED ORDER — CEPHALEXIN 250 MG PO CAPS: 500.0000 mg | ORAL_CAPSULE | Freq: Three times a day (TID) | ORAL | Status: DC

## 2021-09-27 MED ORDER — OXYCODONE HCL 5 MG PO TABS: 5.0000 mg | ORAL_TABLET | ORAL | Status: DC | PRN

## 2021-09-27 MED ORDER — THIAMINE HCL 100 MG PO TABS
100.0000 mg | ORAL_TABLET | Freq: Every day | ORAL | Status: DC
  Administered 2021-09-27: 100 mg via ORAL
  Filled 2021-09-27: qty 1

## 2021-09-27 MED ORDER — GABAPENTIN 300 MG PO CAPS: 400.0000 mg | ORAL_CAPSULE | Freq: Three times a day (TID) | ORAL | Status: DC

## 2021-09-27 MED ORDER — CLONAZEPAM 0.5 MG PO TABS
0.5000 mg | ORAL_TABLET | Freq: Two times a day (BID) | ORAL | Status: DC | PRN
  Administered 2021-09-27: 0.5 mg via ORAL
  Filled 2021-09-27: qty 1

## 2021-09-27 MED ORDER — ACETAMINOPHEN 325 MG PO TABS: 650.0000 mg | ORAL_TABLET | Freq: Four times a day (QID) | ORAL | Status: DC | PRN

## 2021-09-27 NOTE — ED Provider Notes (Signed)
[facility] Provider Note   CSN: [id number] Arrival date & time: 09/27/21  1154     History  Chief Complaint  Patient presents with   Anxiety    L[facility] House is a 71 y.o. male.  71 yo M with a chief complaint of anxiety and pain.  Tells me that he needs his pain and anxiety medicines and unfortunately they were not available when he made it to his skilled nursing facility last night.  Felt his anxiety and pain have gotten worse overnight and needed to come back to the House.  Tells me that I have none of his medications at the facility yet.  This was confirmed by EMS stated that he was being sent because he did not have his medications there yet and was having worsening symptoms.   Anxiety      Home Medications Prior to Admission medications   Medication Sig Start Date End Date Taking? Authorizing Provider  acetaminophen (TYLENOL) 325 MG tablet Take 2 tablets (650 mg total) by mouth every 6 (six) hours as needed for mild pain (or Fever >/= 101). 08/24/21  Yes Richarda Osmond, MD  albuterol (VENTOLIN HFA) 108 (90 Base) MCG/ACT inhaler Inhale 1-2 puffs into the lungs every 4 (four) hours as needed for shortness of breath. 07/08/19  Yes [provider]  allopurinol (ZYLOPRIM) 300 MG tablet Take 300 mg by mouth daily. 05/16/20  Yes [provider]  cephALEXin (KEFLEX) 500 MG capsule Take 1 capsule (500 mg total) by mouth 3 (three) times daily for 10 days. 09/21/21 10/01/21 Yes Thurnell Lose, MD  Cholecalciferol (VITAMIN D3) 1.25 MG (50000 UT) TABS Take 50,000 Units by mouth every Wednesday.   Yes [provider]  clonazePAM (KLONOPIN) 1 MG tablet Take 0.5 tablets (0.5 mg total) by mouth 2 (two) times daily as needed. Patient taking differently: Take 0.5 mg by mouth every 12 (twelve) hours as needed for anxiety. 09/26/21  Yes Elgergawy, Silver Huguenin, MD  docusate sodium (COLACE) 100 MG capsule Take 2 capsules (200 mg  total) by mouth daily. Patient taking differently: Take 100 mg by mouth daily. 09/21/21  Yes Thurnell Lose, MD  folic acid (FOLVITE) 1 MG tablet Take 1 tablet (1 mg total) by mouth daily. 09/21/21  Yes Thurnell Lose, MD  furosemide (LASIX) 20 MG tablet Take 1 tablet (20 mg total) by mouth daily. 09/26/21 09/26/22 Yes Elgergawy, Silver Huguenin, MD  gabapentin (NEURONTIN) 400 MG capsule Take 1 capsule (400 mg total) by mouth 3 (three) times daily. 09/26/21  Yes Elgergawy, Silver Huguenin, MD  ipratropium-albuterol (DUONEB) 0.5-2.5 (3) MG/3ML SOLN Take 3 mLs by nebulization every 6 (six) hours as needed. Patient taking differently: Take 3 mLs by nebulization every 6 (six) hours as needed (shortness of breath/wheezing). 09/21/21  Yes Thurnell Lose, MD  lidocaine (LIDODERM) 5 % Place 1 patch onto the skin at bedtime. Remove & Discard patch within 12 hours or as directed by MD   Yes [provider]  Lidocaine 4 % PTCH Place 1 patch onto the skin at bedtime.   Yes [provider]  oxyCODONE (OXY IR/ROXICODONE) 5 MG immediate release tablet Take 1 tablet (5 mg total) by mouth every 4 (four) hours as needed. Patient taking differently: Take 5 mg by mouth every 4 (four) hours as needed for severe pain. 09/26/21  Yes Elgergawy, Silver Huguenin, MD  senna-docusate (SENOKOT-S) 8.6-50 MG tablet Take 1 tablet by mouth at bedtime as needed  for mild constipation. 09/21/21  Yes Thurnell Lose, MD  thiamine 100 MG tablet Take 1 tablet (100 mg total) by mouth daily. 09/21/21  Yes Thurnell Lose, MD      Allergies    Ciprocin-fluocin-procin [fluocinolone], Ciprofloxacin, Colchicine, and Duloxetine    Review of Systems   Review of Systems  Physical Exam Updated Vital Signs BP (!) 146/91    Pulse 83    Temp 98 F (36.7 C) (Oral)    Resp 13    Ht '6\' 1"'$  (1.854 m)    Wt 79.4 kg    SpO2 95%    BMI 23.09 kg/m  Physical Exam Vitals and nursing note reviewed.  Constitutional:      Appearance: He is well-developed.      Comments: Chronically ill-appearing  HENT:     Head: Normocephalic and atraumatic.  Eyes:     Pupils: Pupils are equal, round, and reactive to light.  Neck:     Vascular: No JVD.  Cardiovascular:     Rate and Rhythm: Normal rate and regular rhythm.     Heart sounds: No murmur heard.   No friction rub. No gallop.  Pulmonary:     Effort: No respiratory distress.     Breath sounds: No wheezing.  Abdominal:     General: There is no distension.     Tenderness: There is no abdominal tenderness. There is no guarding or rebound.  Musculoskeletal:        General: Normal range of motion.     Cervical back: Normal range of motion and neck supple.  Skin:    Coloration: Skin is not pale.     Findings: No rash.  Neurological:     Mental Status: He is alert and oriented to person, place, and time.  Psychiatric:        Behavior: Behavior normal.    ED Results / Procedures / Treatments   Labs (all labs ordered are listed, but only abnormal results are displayed) Labs Reviewed  CBC WITH DIFFERENTIAL/PLATELET - Abnormal; Notable for the following components:      Result Value   RBC 4.07 (*)    Hemoglobin 12.4 (*)    HCT 38.4 (*)    Abs Immature Granulocytes 0.08 (*)    All other components within normal limits  BASIC METABOLIC PANEL - Abnormal; Notable for the following components:   Sodium 132 (*)    BUN <5 (*)    Creatinine, Ser 0.40 (*)    Calcium 8.6 (*)    All other components within normal limits    EKG None  Radiology DG Chest Port 1 View  Result Date: 09/27/2021 CLINICAL DATA:  sob EXAM: PORTABLE CHEST 1 VIEW COMPARISON:  September 24, 2021. FINDINGS: Similar small left hydropneumothorax. Right-sided skin fold. Similar emphysema and bilateral reticular opacities. Similar streaky left basilar opacities. Similar cardiomediastinal silhouette. No evidence of acute osseous abnormality. IMPRESSION: 1. Similar small left hydropneumothorax. 2. Similar emphysema and bilateral reticular  opacities. 3. Similar streaky left basilar opacities, which could represent atelectasis, aspiration, and/or pneumonia. Electronically Signed   By: Margaretha Sheffield M.D.   On: 09/27/2021 12:19    Procedures Procedures    Medications Ordered in ED Medications  acetaminophen (TYLENOL) tablet 650 mg (has no administration in time range)  cephALEXin (KEFLEX) capsule 500 mg (has no administration in time range)  clonazePAM (KLONOPIN) tablet 0.5 mg (0.5 mg Oral Given 09/27/21 1334)  docusate sodium (COLACE) capsule 200 mg (200 mg Oral Given  09/27/21 1236)  furosemide (LASIX) tablet 20 mg (20 mg Oral Given 09/27/21 1236)  gabapentin (NEURONTIN) capsule 400 mg (has no administration in time range)  oxyCODONE (Oxy IR/ROXICODONE) immediate release tablet 5 mg (has no administration in time range)  thiamine tablet 100 mg (100 mg Oral Given 09/27/21 1236)    ED Course/ Medical Decision Making/ A&P                           Medical Decision Making Amount and/or Complexity of Data Reviewed Labs: ordered. Radiology: ordered.  Risk OTC drugs. Prescription drug management.   71 yo M recently hospitalized for almost a month with septic shock secondary to an empyema that required VATS.  They just found placement for him and he left the House yesterday afternoon.  Since he has been there they have not had any of his medications including his antibiotics.  He is mostly concerned about his Klonopin and oxycodone's.  Tells me that his pain is much worse and his anxiety is through the roof.  He denies any other new changes.  Denies fevers denies cough.  We will obtain an laboratory evaluation.  Repeat chest x-ray.  No significant anemia no significant electrolyte abnormality.  Renal function appears to be at baseline.  Chest x-ray independently interpreted by me with improved aeration in the left lung base.  No obvious worsening.  At this time it seems more likely the patient with history of polysubstance  abuse was experiencing some mild withdrawal without being able to have his medications.  Case was discussed with social work.  We will discharge the patient back to his facility.  2:01 PM:  I have discussed the diagnosis/risks/treatment options with the patient.  Evaluation and diagnostic testing in the emergency department does not suggest an emergent condition requiring admission or immediate intervention beyond what has been performed at this time.  They will follow up with  PCP. We also discussed returning to the ED immediately if new or worsening sx occur. We discussed the sx which are most concerning (e.g., sudden worsening pain, fever, inability to tolerate by mouth) that necessitate immediate return. Medications administered to the patient during their visit and any new prescriptions provided to the patient are listed below.  Medications given during this visit Medications  acetaminophen (TYLENOL) tablet 650 mg (has no administration in time range)  cephALEXin (KEFLEX) capsule 500 mg (has no administration in time range)  clonazePAM (KLONOPIN) tablet 0.5 mg (0.5 mg Oral Given 09/27/21 1334)  docusate sodium (COLACE) capsule 200 mg (200 mg Oral Given 09/27/21 1236)  furosemide (LASIX) tablet 20 mg (20 mg Oral Given 09/27/21 1236)  gabapentin (NEURONTIN) capsule 400 mg (has no administration in time range)  oxyCODONE (Oxy IR/ROXICODONE) immediate release tablet 5 mg (has no administration in time range)  thiamine tablet 100 mg (100 mg Oral Given 09/27/21 1236)     The patient appears reasonably screen and/or stabilized for discharge and I doubt any other medical condition or other [facility] requiring further screening, evaluation, or treatment in the ED at this time prior to discharge.         Final Clinical Impression(s) / ED Diagnoses Final diagnoses:  Anxiety    Rx / DC Orders ED Discharge Orders     None         Deno Etienne, DO 09/27/21 1401

## 2021-09-27 NOTE — Discharge Instructions (Signed)
Your facility should be able to provide you with your medications.  Please return for worsening difficulty breathing fever ?

## 2021-09-27 NOTE — Progress Notes (Signed)
Patient remained stable alert and oriented. Night medications given. [facility] arrived into the unit to transfer patient to SNF, vital signs stable, No sign of distress noted. Patient left the unit at exactly 0150 via the EMS stretcher. ?

## 2021-09-27 NOTE — ED Triage Notes (Signed)
Pt BIB [facility] from [facility] where he arrived last night after being discharged. Per [facility] they were not able to get his medications yet and pt states he is feeling anxious and [facility] d/t not getting his klonopin or oxy. Pt called EMS from his personal cell phone and requested to be transported.  ? ? ?160 palpated BP ?90 pulse  ?95% RA  ?113 CGB ?

## 2021-09-27 NOTE — ED Notes (Signed)
Called [facility] for transport , no eta given  ?

## 2021-09-29 ENCOUNTER — Other Ambulatory Visit: Payer: Self-pay | Admitting: Family Medicine

## 2021-09-29 ENCOUNTER — Other Ambulatory Visit (HOSPITAL_COMMUNITY): Payer: Self-pay

## 2021-09-29 ENCOUNTER — Other Ambulatory Visit (HOSPITAL_COMMUNITY): Payer: Self-pay | Admitting: Family Medicine

## 2021-09-29 DIAGNOSIS — G9340 Encephalopathy, unspecified: Secondary | ICD-10-CM

## 2021-10-02 DIAGNOSIS — R69 Illness, unspecified: Secondary | ICD-10-CM | POA: Diagnosis not present

## 2021-10-02 DIAGNOSIS — F331 Major depressive disorder, recurrent, moderate: Secondary | ICD-10-CM | POA: Diagnosis not present

## 2021-10-02 DIAGNOSIS — F1020 Alcohol dependence, uncomplicated: Secondary | ICD-10-CM | POA: Diagnosis not present

## 2021-10-04 DIAGNOSIS — J90 Pleural effusion, not elsewhere classified: Secondary | ICD-10-CM | POA: Diagnosis not present

## 2021-10-04 DIAGNOSIS — D649 Anemia, unspecified: Secondary | ICD-10-CM | POA: Diagnosis not present

## 2021-10-04 DIAGNOSIS — J189 Pneumonia, unspecified organism: Secondary | ICD-10-CM | POA: Diagnosis not present

## 2021-10-04 DIAGNOSIS — J984 Other disorders of lung: Secondary | ICD-10-CM | POA: Diagnosis not present

## 2021-10-04 DIAGNOSIS — G8929 Other chronic pain: Secondary | ICD-10-CM | POA: Diagnosis not present

## 2021-10-04 DIAGNOSIS — I10 Essential (primary) hypertension: Secondary | ICD-10-CM | POA: Diagnosis not present

## 2021-10-05 DIAGNOSIS — Z716 Tobacco abuse counseling: Secondary | ICD-10-CM | POA: Diagnosis not present

## 2021-10-05 DIAGNOSIS — G8929 Other chronic pain: Secondary | ICD-10-CM | POA: Diagnosis not present

## 2021-10-05 DIAGNOSIS — M109 Gout, unspecified: Secondary | ICD-10-CM | POA: Diagnosis not present

## 2021-10-05 DIAGNOSIS — R69 Illness, unspecified: Secondary | ICD-10-CM | POA: Diagnosis not present

## 2021-10-05 DIAGNOSIS — J189 Pneumonia, unspecified organism: Secondary | ICD-10-CM | POA: Diagnosis not present

## 2021-10-05 DIAGNOSIS — Z720 Tobacco use: Secondary | ICD-10-CM | POA: Diagnosis not present

## 2021-10-05 DIAGNOSIS — J869 Pyothorax without fistula: Secondary | ICD-10-CM | POA: Diagnosis not present

## 2021-10-05 DIAGNOSIS — L8995 Pressure ulcer of unspecified site, unstageable: Secondary | ICD-10-CM | POA: Diagnosis not present

## 2021-10-09 ENCOUNTER — Other Ambulatory Visit: Payer: Self-pay | Admitting: Thoracic Surgery (Cardiothoracic Vascular Surgery)

## 2021-10-09 ENCOUNTER — Other Ambulatory Visit (HOSPITAL_COMMUNITY): Payer: Self-pay

## 2021-10-09 DIAGNOSIS — J869 Pyothorax without fistula: Secondary | ICD-10-CM

## 2021-10-10 ENCOUNTER — Encounter: Payer: Medicare HMO | Admitting: Thoracic Surgery (Cardiothoracic Vascular Surgery)

## 2021-10-10 LAB — FUNGAL ORGANISM REFLEX

## 2021-10-10 LAB — FUNGUS CULTURE WITH STAIN

## 2021-10-10 LAB — FUNGUS CULTURE RESULT

## 2021-10-11 DIAGNOSIS — I619 Nontraumatic intracerebral hemorrhage, unspecified: Secondary | ICD-10-CM | POA: Diagnosis not present

## 2021-10-11 DIAGNOSIS — L8995 Pressure ulcer of unspecified site, unstageable: Secondary | ICD-10-CM | POA: Diagnosis not present

## 2021-10-11 DIAGNOSIS — R69 Illness, unspecified: Secondary | ICD-10-CM | POA: Diagnosis not present

## 2021-10-11 DIAGNOSIS — J869 Pyothorax without fistula: Secondary | ICD-10-CM | POA: Diagnosis not present

## 2021-10-11 DIAGNOSIS — M109 Gout, unspecified: Secondary | ICD-10-CM | POA: Diagnosis not present

## 2021-10-11 DIAGNOSIS — R9389 Abnormal findings on diagnostic imaging of other specified body structures: Secondary | ICD-10-CM | POA: Diagnosis not present

## 2021-10-11 DIAGNOSIS — J189 Pneumonia, unspecified organism: Secondary | ICD-10-CM | POA: Diagnosis not present

## 2021-10-11 DIAGNOSIS — G8929 Other chronic pain: Secondary | ICD-10-CM | POA: Diagnosis not present

## 2021-10-12 ENCOUNTER — Encounter (HOSPITAL_COMMUNITY): Payer: Self-pay

## 2021-10-12 ENCOUNTER — Ambulatory Visit (HOSPITAL_COMMUNITY): Payer: Medicare HMO

## 2021-10-15 DIAGNOSIS — J9611 Chronic respiratory failure with hypoxia: Secondary | ICD-10-CM | POA: Diagnosis not present

## 2021-10-15 DIAGNOSIS — J869 Pyothorax without fistula: Secondary | ICD-10-CM | POA: Diagnosis not present

## 2021-10-15 DIAGNOSIS — K469 Unspecified abdominal hernia without obstruction or gangrene: Secondary | ICD-10-CM | POA: Diagnosis not present

## 2021-10-15 DIAGNOSIS — R109 Unspecified abdominal pain: Secondary | ICD-10-CM | POA: Diagnosis not present

## 2021-10-15 DIAGNOSIS — I872 Venous insufficiency (chronic) (peripheral): Secondary | ICD-10-CM | POA: Diagnosis not present

## 2021-10-15 DIAGNOSIS — R0600 Dyspnea, unspecified: Secondary | ICD-10-CM | POA: Diagnosis not present

## 2021-10-15 DIAGNOSIS — J440 Chronic obstructive pulmonary disease with acute lower respiratory infection: Secondary | ICD-10-CM | POA: Diagnosis not present

## 2021-10-15 DIAGNOSIS — Z9181 History of falling: Secondary | ICD-10-CM | POA: Diagnosis not present

## 2021-10-15 DIAGNOSIS — I10 Essential (primary) hypertension: Secondary | ICD-10-CM | POA: Diagnosis not present

## 2021-10-15 DIAGNOSIS — J9601 Acute respiratory failure with hypoxia: Secondary | ICD-10-CM | POA: Diagnosis not present

## 2021-10-15 DIAGNOSIS — M1A49X0 Other secondary chronic gout, multiple sites, without tophus (tophi): Secondary | ICD-10-CM | POA: Diagnosis not present

## 2021-10-15 DIAGNOSIS — R69 Illness, unspecified: Secondary | ICD-10-CM | POA: Diagnosis not present

## 2021-10-15 DIAGNOSIS — J189 Pneumonia, unspecified organism: Secondary | ICD-10-CM | POA: Diagnosis not present

## 2021-10-15 DIAGNOSIS — G8929 Other chronic pain: Secondary | ICD-10-CM | POA: Diagnosis not present

## 2021-10-15 DIAGNOSIS — J329 Chronic sinusitis, unspecified: Secondary | ICD-10-CM | POA: Diagnosis not present

## 2021-10-15 DIAGNOSIS — R6521 Severe sepsis with septic shock: Secondary | ICD-10-CM | POA: Diagnosis not present

## 2021-10-17 ENCOUNTER — Emergency Department: Payer: Medicare HMO

## 2021-10-17 ENCOUNTER — Encounter: Payer: Self-pay | Admitting: Emergency Medicine

## 2021-10-17 ENCOUNTER — Emergency Department
Admission: EM | Admit: 2021-10-17 | Discharge: 2021-10-17 | Disposition: A | Discharge status: Home / Self Care | Payer: Medicare HMO | Attending: Emergency Medicine | Admitting: Emergency Medicine

## 2021-10-17 ENCOUNTER — Other Ambulatory Visit: Payer: Self-pay

## 2021-10-17 DIAGNOSIS — Z743 Need for continuous supervision: Secondary | ICD-10-CM | POA: Diagnosis not present

## 2021-10-17 DIAGNOSIS — M25562 Pain in left knee: Secondary | ICD-10-CM | POA: Diagnosis not present

## 2021-10-17 DIAGNOSIS — Y9301 Activity, walking, marching and hiking: Secondary | ICD-10-CM | POA: Diagnosis not present

## 2021-10-17 DIAGNOSIS — M25569 Pain in unspecified knee: Secondary | ICD-10-CM | POA: Diagnosis not present

## 2021-10-17 DIAGNOSIS — S8992XA Unspecified injury of left lower leg, initial encounter: Secondary | ICD-10-CM | POA: Diagnosis present

## 2021-10-17 DIAGNOSIS — R52 Pain, unspecified: Secondary | ICD-10-CM | POA: Diagnosis not present

## 2021-10-17 DIAGNOSIS — W1839XA Other fall on same level, initial encounter: Secondary | ICD-10-CM | POA: Diagnosis not present

## 2021-10-17 IMAGING — DX DG KNEE COMPLETE 4+V*L*
4 series · 4 of 4 positions shown · non-contrast
Comparison: [DATE]

CLINICAL DATA: Pain

EXAM:
LEFT KNEE - COMPLETE 4+ VIEW

[knee ap]
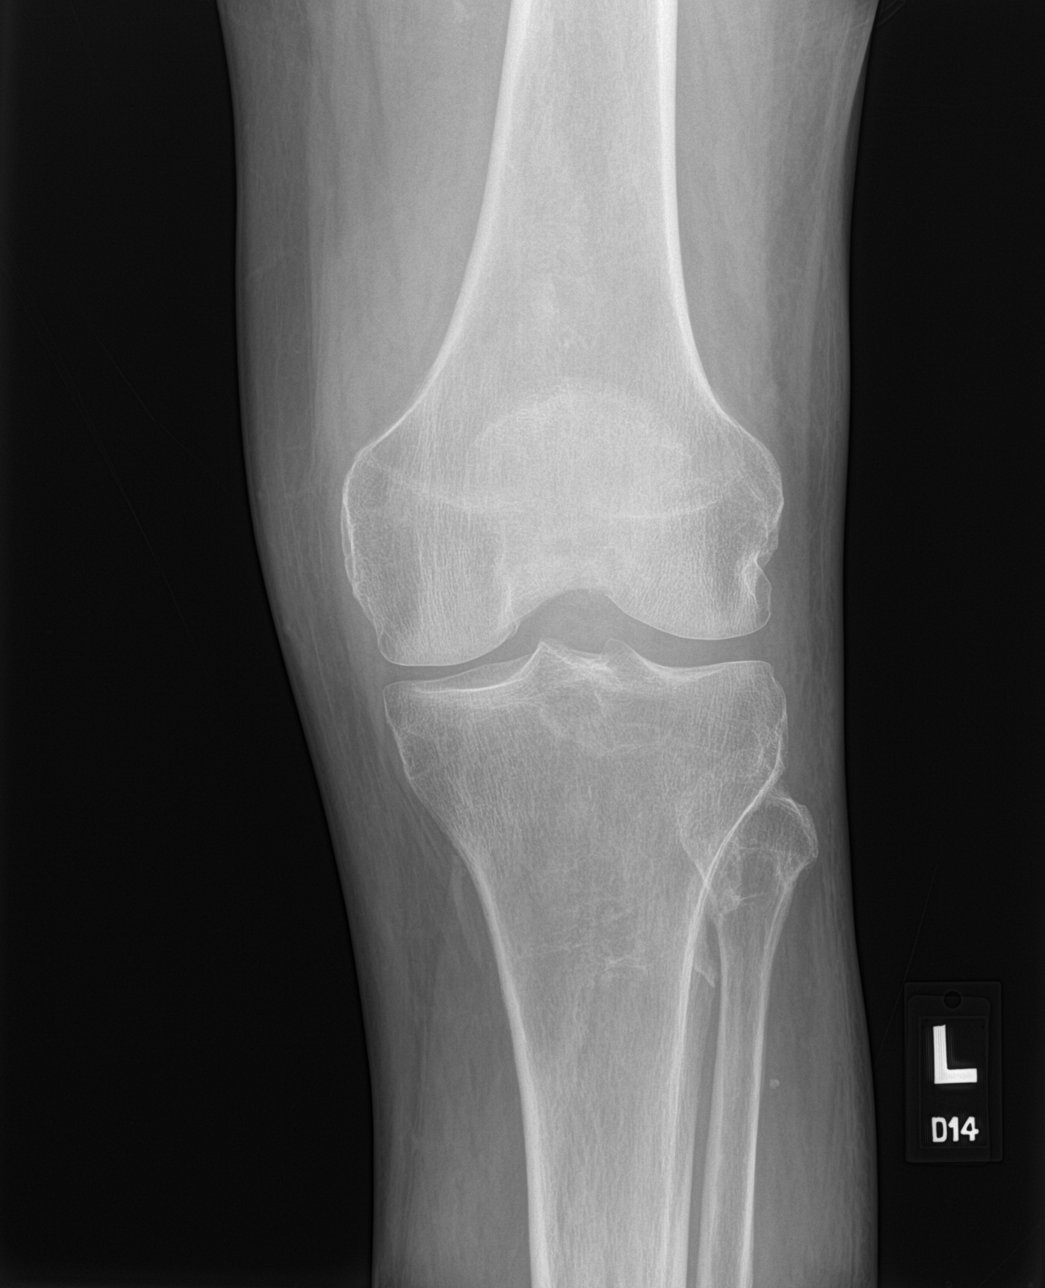

[knee lat]
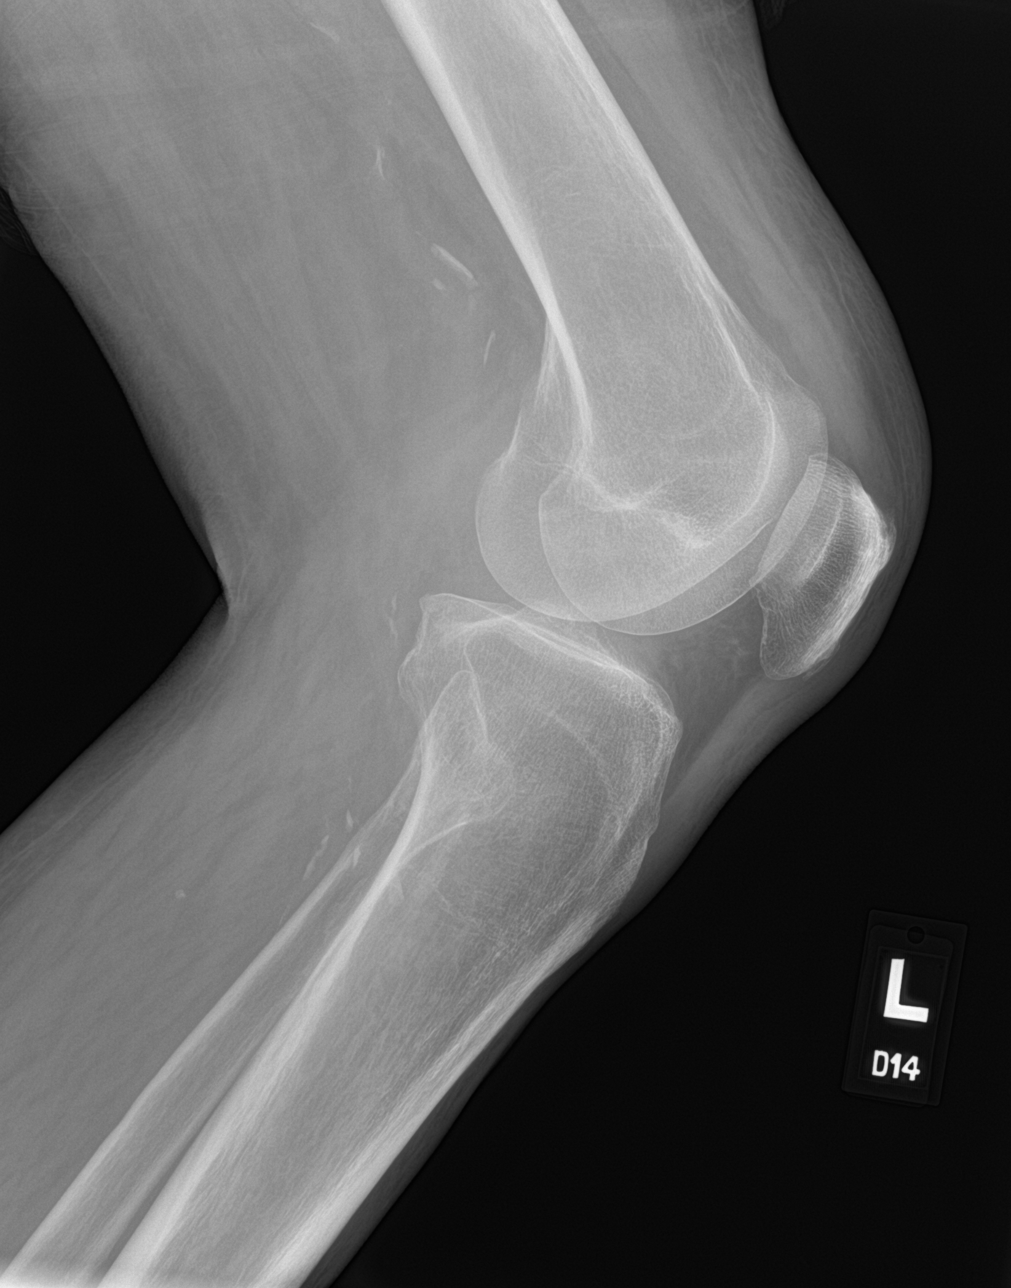

[knee obl (1 of 2)]
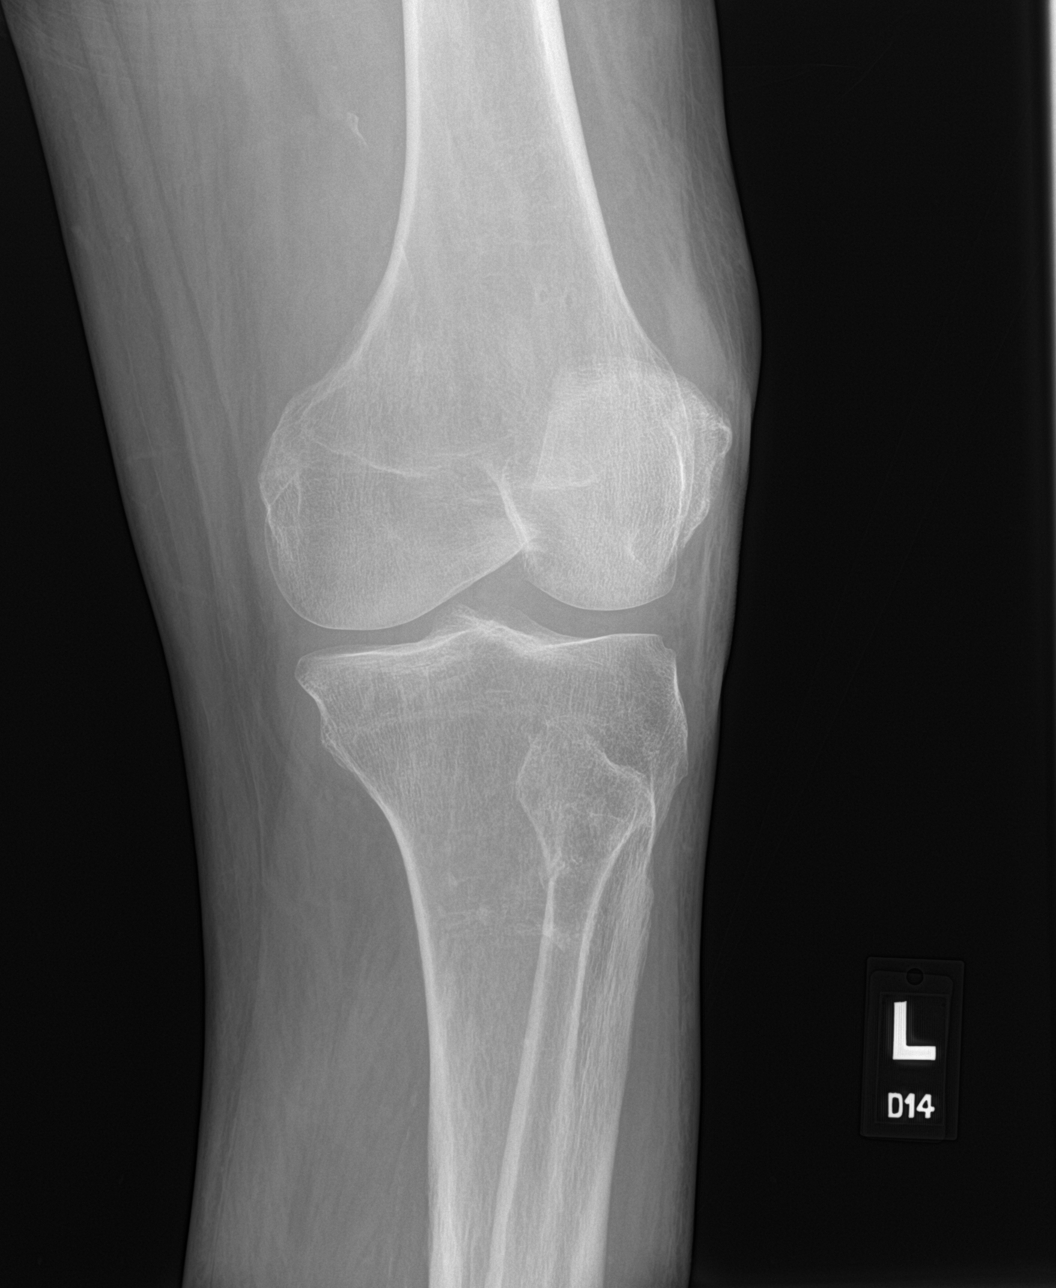

[knee obl (2 of 2)]
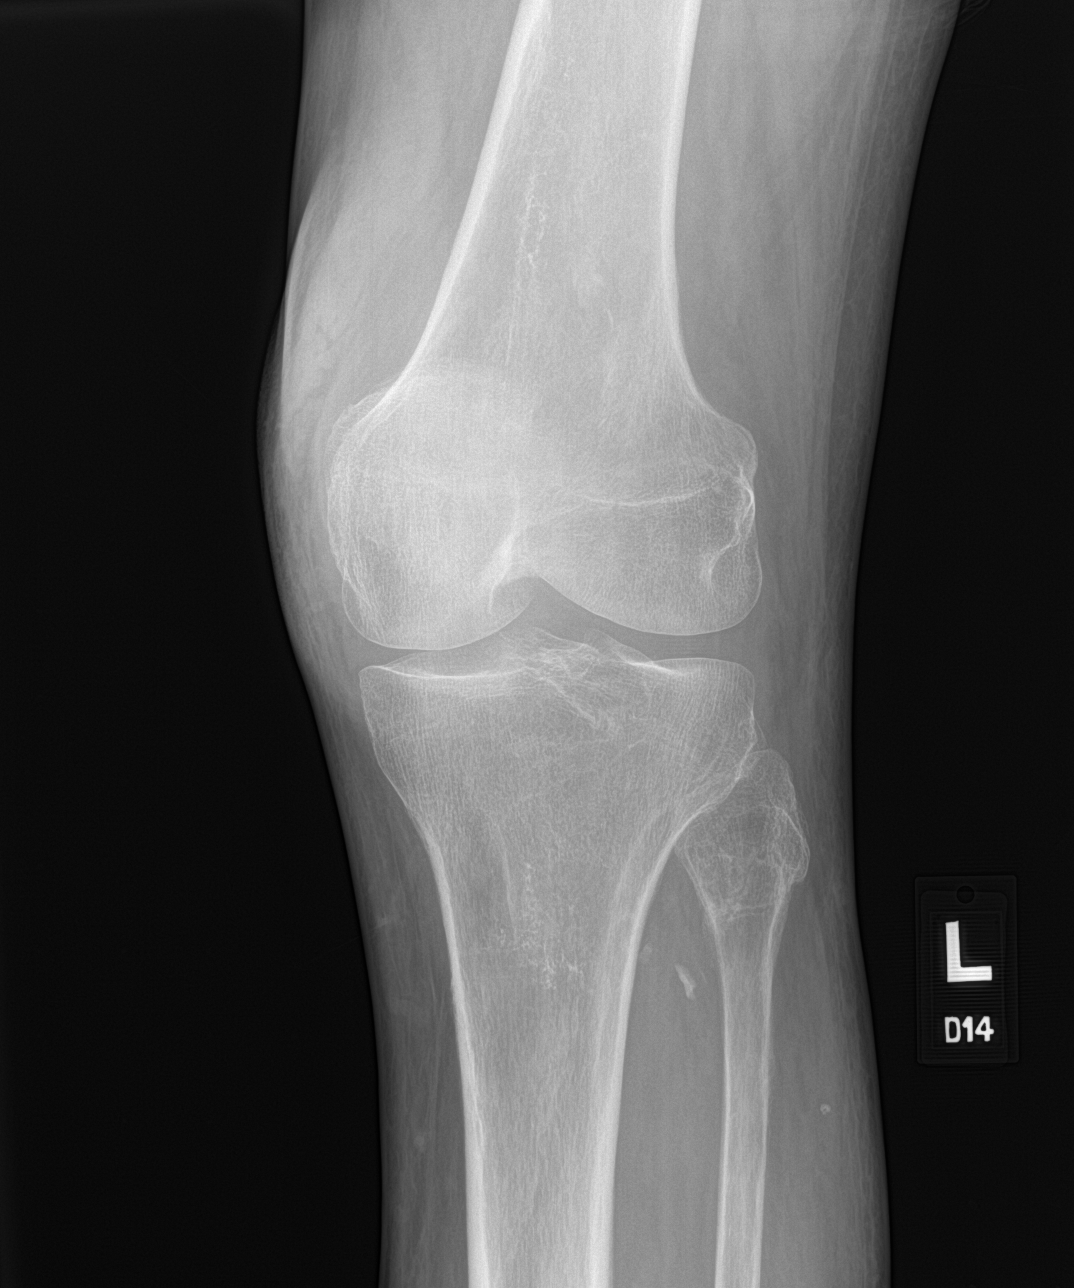

[4 of 4 positions shown; findings below may reference images not displayed]

FINDINGS: No fracture or dislocation is seen. There is no effusion in the
suprapatellar bursa. Minimal bony spurs seen in the patella. There
are scattered arterial calcifications in the soft tissues. There is
mild stranding in the subcutaneous plane.
IMPRESSION: No fracture or dislocation is seen. There is no significant
effusion. Minimal bony spurs seen in the patella.

## 2021-10-17 MED ORDER — MORPHINE SULFATE (PF) 4 MG/ML IV SOLN
4.0000 mg | Freq: Once | INTRAVENOUS | Status: AC
  Administered 2021-10-17: 4 mg via INTRAMUSCULAR
  Filled 2021-10-17: qty 1

## 2021-10-17 MED ORDER — ONDANSETRON 8 MG PO TBDP
8.0000 mg | ORAL_TABLET | Freq: Once | ORAL | Status: AC
Start: 2021-10-17 — End: 2021-10-17
  Administered 2021-10-17: 8 mg via ORAL
  Filled 2021-10-17: qty 1

## 2021-10-17 MED ORDER — MELOXICAM 15 MG PO TABS: 15.0000 mg | ORAL_TABLET | Freq: Every day | ORAL | 0 refills | Status: AC

## 2021-10-17 NOTE — ED Triage Notes (Signed)
FIRST NURSE NOTE:  ?Pt via EMS from home. Pt c/o L knee pain for the past 2 days and swelling. Denies any injury. Pt is A&Ox4 and NAD.  ? ?113/66  ?97% on RA ?108 HR ?18 RR ?

## 2021-10-17 NOTE — ED Provider Notes (Signed)
? ?[facility] ?Provider Note ? ?Patient Contact: 3:29 PM (approximate) ? ? ?History  ? ?Knee Pain ? ? ?HPI ? ?[facility] is a 71 y.o. male who presents the emergency department complaining of left knee pain.  Patient recently came home from rehab after having an IPMN drained with chest tube.  Patient states he was using his walker when his knee "gave out" from underneath him and he fell onto this knee.  This occurred to the left knee.  He has had ongoing pain since.  He had a prescription for oxycodone from the rehab facility and has been taking same without much improvement of his knee pain.  He reports mild edema but no deformity.  He can still bend and flex the knee but doing so increases the pain.  Patient can still bear weight but again doing so increases the pain.  Denies any open wounds.  He denies any other complaints at this time. ?  ? ? ?Physical Exam  ? ?Triage Vital Signs: ?ED Triage Vitals  ?Enc Vitals Group  ?   BP 10/17/21 1512 111/74  ?   Pulse Rate 10/17/21 1512 98  ?   Resp 10/17/21 1512 17  ?   Temp 10/17/21 1512 97.6 ?F (36.4 ?C)  ?   Temp Source 10/17/21 1512 Oral  ?   SpO2 10/17/21 1512 96 %  ?   Weight 10/17/21 1512 174 lb (78.9 kg)  ?   Height 10/17/21 1512 '6\' 1"'$  (1.854 m)  ?   Head Circumference --   ?   Peak Flow --   ?   Pain Score --   ?   Pain Loc --   ?   Pain Edu? --   ?   Excl. in [place]? --   ? ? ?Most recent vital signs: ?Vitals:  ? 10/17/21 1512  ?BP: 111/74  ?Pulse: 98  ?Resp: 17  ?Temp: 97.6 ?F (36.4 ?C)  ?SpO2: 96%  ? ? ? ?General: Alert and in no acute distress.  ?Cardiovascular:  Good peripheral perfusion ?Respiratory: Normal respiratory effort without tachypnea or retractions. Lungs CTAB.  ?Musculoskeletal: Full range of motion to all extremities.  Slight edema of the left knee when compared with right.  No ecchymosis or deformity noted.  Patient is able to flex and extend the knee at this time.  Global anterior tenderness to palpation without palpable  abnormality.  No ballottement.  Both varus and valgus increased pain along the lateral aspect of the knee but there is no true laxity.  Dorsalis pedis pulses sensation intact distally. ?Neurologic:  No gross focal neurologic deficits are appreciated.  ?Skin:   No rash noted ?Other: ? ? ?ED Results / Procedures / Treatments  ? ?Labs ?(all labs ordered are listed, but only abnormal results are displayed) ?Labs Reviewed - No data to display ? ? ?EKG ? ? ? ? ?RADIOLOGY ? ?I personally viewed and evaluated these images as part of my medical decision making, as well as reviewing the written report by the radiologist. ? ?ED Provider Interpretation: No acute fracture or dislocation identified.  No effusion identified at this time ? ?DG Knee Complete 4 Views Left ? ?Result Date: 10/17/2021 ?CLINICAL DATA:  Pain EXAM: LEFT KNEE - COMPLETE 4+ VIEW COMPARISON:  09/10/2021 FINDINGS: No fracture or dislocation is seen. There is no effusion in the suprapatellar bursa. Minimal bony spurs seen in the patella. There are scattered arterial calcifications in the soft tissues. There is mild stranding  in the subcutaneous plane. IMPRESSION: No fracture or dislocation is seen. There is no significant effusion. Minimal bony spurs seen in the patella. Electronically Signed   By: Elmer Picker M.D.   On: 10/17/2021 15:51   ? ?PROCEDURES: ? ?Critical Care performed: No ? ?Procedures ? ? ?MEDICATIONS ORDERED IN ED: ?Medications  ?morphine (PF) 4 MG/ML injection 4 mg (4 mg Intramuscular Given 10/17/21 1638)  ?ondansetron (ZOFRAN-ODT) disintegrating tablet 8 mg (8 mg Oral Given 10/17/21 1638)  ? ? ? ?IMPRESSION / MDM / ASSESSMENT AND PLAN / ED COURSE  ?I reviewed the triage vital signs and the nursing notes. ?             ?               ? ?Differential diagnosis includes, but is not limited to, arthritic changes, knee sprain, ligament rupture, meniscal injury, or fracture ? ? ?Patient's diagnosis is consistent with knee pain.  Patient  presents emergency department after developing knee pain after his knee "gave out" from underneath him.  Patient has been walking with a walker but states that the knee buckled causing him to fall.  He landed on the knee and sustained no other injuries.  He is here only for his left knee pain.  Minimal edema of the left knee when compared with right.  He endorsed pain with varus and valgus along the lateral aspect of the knee but there was no true joint laxity.  Otherwise exam was reassuring.  Imaging revealed no effusion or fracture or dislocation.  Patient does receive regular narcotics, most recently from the rehab that he is staying in.  I have encouraged him to follow-up with the individual who typically writes his narcotics for him.  He states that he has reached out to them but has not heard back today from them.  Patient will have an anti-inflammatory for additional symptom control.  Knee immobilizer placed and follow-up with orthopedics for further management..  Patient is given ED precautions to return to the ED for any worsening or new symptoms. ? ? ? ?  ? ? ?FINAL CLINICAL IMPRESSION(S) / ED DIAGNOSES  ? ?Final diagnoses:  ?Acute pain of left knee  ? ? ? ?Rx / DC Orders  ? ?ED Discharge Orders   ? ?      Ordered  ?  meloxicam (MOBIC) 15 MG tablet  Daily       ? 10/17/21 1639  ? ?  ?  ? ?  ? ? ? ?Note:  This document was prepared using Dragon voice recognition software and may include unintentional dictation errors. ?  ?Darletta Moll, PA-C ?10/17/21 1639 ? ?  ?Vladimir Crofts, MD ?10/17/21 2238 ? ?

## 2021-10-18 DIAGNOSIS — M6281 Muscle weakness (generalized): Secondary | ICD-10-CM | POA: Diagnosis not present

## 2021-10-19 DIAGNOSIS — G8929 Other chronic pain: Secondary | ICD-10-CM | POA: Diagnosis not present

## 2021-10-19 DIAGNOSIS — J440 Chronic obstructive pulmonary disease with acute lower respiratory infection: Secondary | ICD-10-CM | POA: Diagnosis not present

## 2021-10-19 DIAGNOSIS — I10 Essential (primary) hypertension: Secondary | ICD-10-CM | POA: Diagnosis not present

## 2021-10-19 DIAGNOSIS — M1A49X0 Other secondary chronic gout, multiple sites, without tophus (tophi): Secondary | ICD-10-CM | POA: Diagnosis not present

## 2021-10-19 DIAGNOSIS — R69 Illness, unspecified: Secondary | ICD-10-CM | POA: Diagnosis not present

## 2021-10-19 DIAGNOSIS — J869 Pyothorax without fistula: Secondary | ICD-10-CM | POA: Diagnosis not present

## 2021-10-19 DIAGNOSIS — J189 Pneumonia, unspecified organism: Secondary | ICD-10-CM | POA: Diagnosis not present

## 2021-10-19 DIAGNOSIS — I872 Venous insufficiency (chronic) (peripheral): Secondary | ICD-10-CM | POA: Diagnosis not present

## 2021-10-20 DIAGNOSIS — G8929 Other chronic pain: Secondary | ICD-10-CM | POA: Diagnosis not present

## 2021-10-20 DIAGNOSIS — I10 Essential (primary) hypertension: Secondary | ICD-10-CM | POA: Diagnosis not present

## 2021-10-20 DIAGNOSIS — J90 Pleural effusion, not elsewhere classified: Secondary | ICD-10-CM | POA: Diagnosis not present

## 2021-10-20 DIAGNOSIS — F411 Generalized anxiety disorder: Secondary | ICD-10-CM | POA: Diagnosis not present

## 2021-10-20 DIAGNOSIS — J869 Pyothorax without fistula: Secondary | ICD-10-CM | POA: Diagnosis not present

## 2021-10-20 DIAGNOSIS — R0600 Dyspnea, unspecified: Secondary | ICD-10-CM | POA: Diagnosis not present

## 2021-10-20 DIAGNOSIS — M1A49X0 Other secondary chronic gout, multiple sites, without tophus (tophi): Secondary | ICD-10-CM | POA: Diagnosis not present

## 2021-10-20 DIAGNOSIS — I872 Venous insufficiency (chronic) (peripheral): Secondary | ICD-10-CM | POA: Diagnosis not present

## 2021-10-20 DIAGNOSIS — M545 Low back pain, unspecified: Secondary | ICD-10-CM | POA: Diagnosis not present

## 2021-10-20 DIAGNOSIS — J440 Chronic obstructive pulmonary disease with acute lower respiratory infection: Secondary | ICD-10-CM | POA: Diagnosis not present

## 2021-10-20 DIAGNOSIS — E782 Mixed hyperlipidemia: Secondary | ICD-10-CM | POA: Diagnosis not present

## 2021-10-20 DIAGNOSIS — R69 Illness, unspecified: Secondary | ICD-10-CM | POA: Diagnosis not present

## 2021-10-20 DIAGNOSIS — R918 Other nonspecific abnormal finding of lung field: Secondary | ICD-10-CM | POA: Diagnosis not present

## 2021-10-20 DIAGNOSIS — J189 Pneumonia, unspecified organism: Secondary | ICD-10-CM | POA: Diagnosis not present

## 2021-10-20 DIAGNOSIS — F32A Depression, unspecified: Secondary | ICD-10-CM | POA: Diagnosis not present

## 2021-10-24 DIAGNOSIS — M1A49X0 Other secondary chronic gout, multiple sites, without tophus (tophi): Secondary | ICD-10-CM | POA: Diagnosis not present

## 2021-10-24 DIAGNOSIS — G8929 Other chronic pain: Secondary | ICD-10-CM | POA: Diagnosis not present

## 2021-10-24 DIAGNOSIS — J189 Pneumonia, unspecified organism: Secondary | ICD-10-CM | POA: Diagnosis not present

## 2021-10-24 DIAGNOSIS — J440 Chronic obstructive pulmonary disease with acute lower respiratory infection: Secondary | ICD-10-CM | POA: Diagnosis not present

## 2021-10-24 DIAGNOSIS — I872 Venous insufficiency (chronic) (peripheral): Secondary | ICD-10-CM | POA: Diagnosis not present

## 2021-10-24 DIAGNOSIS — R69 Illness, unspecified: Secondary | ICD-10-CM | POA: Diagnosis not present

## 2021-10-24 DIAGNOSIS — I10 Essential (primary) hypertension: Secondary | ICD-10-CM | POA: Diagnosis not present

## 2021-10-24 DIAGNOSIS — J869 Pyothorax without fistula: Secondary | ICD-10-CM | POA: Diagnosis not present

## 2021-10-25 DIAGNOSIS — J440 Chronic obstructive pulmonary disease with acute lower respiratory infection: Secondary | ICD-10-CM | POA: Diagnosis not present

## 2021-10-25 DIAGNOSIS — G8929 Other chronic pain: Secondary | ICD-10-CM | POA: Diagnosis not present

## 2021-10-25 DIAGNOSIS — I872 Venous insufficiency (chronic) (peripheral): Secondary | ICD-10-CM | POA: Diagnosis not present

## 2021-10-25 DIAGNOSIS — M1A49X0 Other secondary chronic gout, multiple sites, without tophus (tophi): Secondary | ICD-10-CM | POA: Diagnosis not present

## 2021-10-25 DIAGNOSIS — I10 Essential (primary) hypertension: Secondary | ICD-10-CM | POA: Diagnosis not present

## 2021-10-25 DIAGNOSIS — J189 Pneumonia, unspecified organism: Secondary | ICD-10-CM | POA: Diagnosis not present

## 2021-10-25 DIAGNOSIS — J869 Pyothorax without fistula: Secondary | ICD-10-CM | POA: Diagnosis not present

## 2021-10-25 DIAGNOSIS — R69 Illness, unspecified: Secondary | ICD-10-CM | POA: Diagnosis not present

## 2021-10-26 DIAGNOSIS — M1A49X0 Other secondary chronic gout, multiple sites, without tophus (tophi): Secondary | ICD-10-CM | POA: Diagnosis not present

## 2021-10-26 DIAGNOSIS — E785 Hyperlipidemia, unspecified: Secondary | ICD-10-CM | POA: Diagnosis not present

## 2021-10-26 DIAGNOSIS — M5440 Lumbago with sciatica, unspecified side: Secondary | ICD-10-CM | POA: Diagnosis not present

## 2021-10-26 DIAGNOSIS — M545 Low back pain, unspecified: Secondary | ICD-10-CM | POA: Diagnosis not present

## 2021-10-26 DIAGNOSIS — J449 Chronic obstructive pulmonary disease, unspecified: Secondary | ICD-10-CM | POA: Diagnosis not present

## 2021-10-26 DIAGNOSIS — D519 Vitamin B12 deficiency anemia, unspecified: Secondary | ICD-10-CM | POA: Diagnosis not present

## 2021-10-26 DIAGNOSIS — I10 Essential (primary) hypertension: Secondary | ICD-10-CM | POA: Diagnosis not present

## 2021-10-26 DIAGNOSIS — R69 Illness, unspecified: Secondary | ICD-10-CM | POA: Diagnosis not present

## 2021-10-26 LAB — ACID FAST CULTURE WITH REFLEXED SENSITIVITIES (MYCOBACTERIA): Acid Fast Culture: NEGATIVE

## 2021-10-27 DIAGNOSIS — J189 Pneumonia, unspecified organism: Secondary | ICD-10-CM | POA: Diagnosis not present

## 2021-10-27 DIAGNOSIS — J440 Chronic obstructive pulmonary disease with acute lower respiratory infection: Secondary | ICD-10-CM | POA: Diagnosis not present

## 2021-10-27 DIAGNOSIS — R69 Illness, unspecified: Secondary | ICD-10-CM | POA: Diagnosis not present

## 2021-10-27 DIAGNOSIS — J869 Pyothorax without fistula: Secondary | ICD-10-CM | POA: Diagnosis not present

## 2021-10-27 DIAGNOSIS — M1A49X0 Other secondary chronic gout, multiple sites, without tophus (tophi): Secondary | ICD-10-CM | POA: Diagnosis not present

## 2021-10-27 DIAGNOSIS — I10 Essential (primary) hypertension: Secondary | ICD-10-CM | POA: Diagnosis not present

## 2021-10-27 DIAGNOSIS — G8929 Other chronic pain: Secondary | ICD-10-CM | POA: Diagnosis not present

## 2021-10-27 DIAGNOSIS — I872 Venous insufficiency (chronic) (peripheral): Secondary | ICD-10-CM | POA: Diagnosis not present

## 2021-10-31 DIAGNOSIS — G8929 Other chronic pain: Secondary | ICD-10-CM | POA: Diagnosis not present

## 2021-10-31 DIAGNOSIS — R69 Illness, unspecified: Secondary | ICD-10-CM | POA: Diagnosis not present

## 2021-10-31 DIAGNOSIS — I10 Essential (primary) hypertension: Secondary | ICD-10-CM | POA: Diagnosis not present

## 2021-10-31 DIAGNOSIS — J869 Pyothorax without fistula: Secondary | ICD-10-CM | POA: Diagnosis not present

## 2021-10-31 DIAGNOSIS — J189 Pneumonia, unspecified organism: Secondary | ICD-10-CM | POA: Diagnosis not present

## 2021-10-31 DIAGNOSIS — I872 Venous insufficiency (chronic) (peripheral): Secondary | ICD-10-CM | POA: Diagnosis not present

## 2021-10-31 DIAGNOSIS — J440 Chronic obstructive pulmonary disease with acute lower respiratory infection: Secondary | ICD-10-CM | POA: Diagnosis not present

## 2021-10-31 DIAGNOSIS — M1A49X0 Other secondary chronic gout, multiple sites, without tophus (tophi): Secondary | ICD-10-CM | POA: Diagnosis not present

## 2021-11-04 DIAGNOSIS — G8929 Other chronic pain: Secondary | ICD-10-CM | POA: Diagnosis not present

## 2021-11-04 DIAGNOSIS — M1A49X0 Other secondary chronic gout, multiple sites, without tophus (tophi): Secondary | ICD-10-CM | POA: Diagnosis not present

## 2021-11-04 DIAGNOSIS — R69 Illness, unspecified: Secondary | ICD-10-CM | POA: Diagnosis not present

## 2021-11-04 DIAGNOSIS — I872 Venous insufficiency (chronic) (peripheral): Secondary | ICD-10-CM | POA: Diagnosis not present

## 2021-11-04 DIAGNOSIS — J869 Pyothorax without fistula: Secondary | ICD-10-CM | POA: Diagnosis not present

## 2021-11-04 DIAGNOSIS — J189 Pneumonia, unspecified organism: Secondary | ICD-10-CM | POA: Diagnosis not present

## 2021-11-04 DIAGNOSIS — J440 Chronic obstructive pulmonary disease with acute lower respiratory infection: Secondary | ICD-10-CM | POA: Diagnosis not present

## 2021-11-04 DIAGNOSIS — I10 Essential (primary) hypertension: Secondary | ICD-10-CM | POA: Diagnosis not present

## 2021-11-08 DIAGNOSIS — G8929 Other chronic pain: Secondary | ICD-10-CM | POA: Diagnosis not present

## 2021-11-08 DIAGNOSIS — M1A49X0 Other secondary chronic gout, multiple sites, without tophus (tophi): Secondary | ICD-10-CM | POA: Diagnosis not present

## 2021-11-08 DIAGNOSIS — M545 Low back pain, unspecified: Secondary | ICD-10-CM | POA: Diagnosis not present

## 2021-11-08 DIAGNOSIS — L03032 Cellulitis of left toe: Secondary | ICD-10-CM | POA: Diagnosis not present

## 2021-11-08 DIAGNOSIS — I10 Essential (primary) hypertension: Secondary | ICD-10-CM | POA: Diagnosis not present

## 2021-11-08 DIAGNOSIS — F32A Depression, unspecified: Secondary | ICD-10-CM | POA: Diagnosis not present

## 2021-11-08 DIAGNOSIS — F411 Generalized anxiety disorder: Secondary | ICD-10-CM | POA: Diagnosis not present

## 2021-11-08 DIAGNOSIS — J189 Pneumonia, unspecified organism: Secondary | ICD-10-CM | POA: Diagnosis not present

## 2021-11-08 DIAGNOSIS — J440 Chronic obstructive pulmonary disease with acute lower respiratory infection: Secondary | ICD-10-CM | POA: Diagnosis not present

## 2021-11-08 DIAGNOSIS — I872 Venous insufficiency (chronic) (peripheral): Secondary | ICD-10-CM | POA: Diagnosis not present

## 2021-11-08 DIAGNOSIS — J869 Pyothorax without fistula: Secondary | ICD-10-CM | POA: Diagnosis not present

## 2021-11-08 DIAGNOSIS — R69 Illness, unspecified: Secondary | ICD-10-CM | POA: Diagnosis not present

## 2021-11-10 DIAGNOSIS — J189 Pneumonia, unspecified organism: Secondary | ICD-10-CM | POA: Diagnosis not present

## 2021-11-10 DIAGNOSIS — J440 Chronic obstructive pulmonary disease with acute lower respiratory infection: Secondary | ICD-10-CM | POA: Diagnosis not present

## 2021-11-10 DIAGNOSIS — G8929 Other chronic pain: Secondary | ICD-10-CM | POA: Diagnosis not present

## 2021-11-10 DIAGNOSIS — R69 Illness, unspecified: Secondary | ICD-10-CM | POA: Diagnosis not present

## 2021-11-10 DIAGNOSIS — I10 Essential (primary) hypertension: Secondary | ICD-10-CM | POA: Diagnosis not present

## 2021-11-10 DIAGNOSIS — M1A49X0 Other secondary chronic gout, multiple sites, without tophus (tophi): Secondary | ICD-10-CM | POA: Diagnosis not present

## 2021-11-10 DIAGNOSIS — I872 Venous insufficiency (chronic) (peripheral): Secondary | ICD-10-CM | POA: Diagnosis not present

## 2021-11-10 DIAGNOSIS — J869 Pyothorax without fistula: Secondary | ICD-10-CM | POA: Diagnosis not present

## 2021-11-13 DIAGNOSIS — M1A49X0 Other secondary chronic gout, multiple sites, without tophus (tophi): Secondary | ICD-10-CM | POA: Diagnosis not present

## 2021-11-13 DIAGNOSIS — I872 Venous insufficiency (chronic) (peripheral): Secondary | ICD-10-CM | POA: Diagnosis not present

## 2021-11-13 DIAGNOSIS — J869 Pyothorax without fistula: Secondary | ICD-10-CM | POA: Diagnosis not present

## 2021-11-13 DIAGNOSIS — G8929 Other chronic pain: Secondary | ICD-10-CM | POA: Diagnosis not present

## 2021-11-13 DIAGNOSIS — I10 Essential (primary) hypertension: Secondary | ICD-10-CM | POA: Diagnosis not present

## 2021-11-13 DIAGNOSIS — J440 Chronic obstructive pulmonary disease with acute lower respiratory infection: Secondary | ICD-10-CM | POA: Diagnosis not present

## 2021-11-13 DIAGNOSIS — R69 Illness, unspecified: Secondary | ICD-10-CM | POA: Diagnosis not present

## 2021-11-13 DIAGNOSIS — J189 Pneumonia, unspecified organism: Secondary | ICD-10-CM | POA: Diagnosis not present

## 2021-11-18 DIAGNOSIS — M6281 Muscle weakness (generalized): Secondary | ICD-10-CM | POA: Diagnosis not present

## 2021-11-28 DIAGNOSIS — M545 Low back pain, unspecified: Secondary | ICD-10-CM | POA: Diagnosis not present

## 2021-11-28 DIAGNOSIS — Z79891 Long term (current) use of opiate analgesic: Secondary | ICD-10-CM | POA: Diagnosis not present

## 2021-11-28 DIAGNOSIS — M25552 Pain in left hip: Secondary | ICD-10-CM | POA: Diagnosis not present

## 2021-11-30 DIAGNOSIS — R5383 Other fatigue: Secondary | ICD-10-CM | POA: Diagnosis not present

## 2021-11-30 DIAGNOSIS — M5117 Intervertebral disc disorders with radiculopathy, lumbosacral region: Secondary | ICD-10-CM | POA: Diagnosis not present

## 2021-11-30 DIAGNOSIS — M5442 Lumbago with sciatica, left side: Secondary | ICD-10-CM | POA: Diagnosis not present

## 2021-11-30 DIAGNOSIS — E291 Testicular hypofunction: Secondary | ICD-10-CM | POA: Diagnosis not present

## 2021-12-18 DIAGNOSIS — M6281 Muscle weakness (generalized): Secondary | ICD-10-CM | POA: Diagnosis not present

## 2021-12-19 DIAGNOSIS — E782 Mixed hyperlipidemia: Secondary | ICD-10-CM | POA: Diagnosis not present

## 2021-12-19 DIAGNOSIS — M545 Low back pain, unspecified: Secondary | ICD-10-CM | POA: Diagnosis not present

## 2021-12-19 DIAGNOSIS — I10 Essential (primary) hypertension: Secondary | ICD-10-CM | POA: Diagnosis not present

## 2021-12-19 DIAGNOSIS — R69 Illness, unspecified: Secondary | ICD-10-CM | POA: Diagnosis not present

## 2021-12-20 DIAGNOSIS — M1990 Unspecified osteoarthritis, unspecified site: Secondary | ICD-10-CM | POA: Diagnosis not present

## 2021-12-20 DIAGNOSIS — M48061 Spinal stenosis, lumbar region without neurogenic claudication: Secondary | ICD-10-CM | POA: Diagnosis not present

## 2021-12-20 DIAGNOSIS — I10 Essential (primary) hypertension: Secondary | ICD-10-CM | POA: Diagnosis not present

## 2021-12-26 DIAGNOSIS — E291 Testicular hypofunction: Secondary | ICD-10-CM | POA: Diagnosis not present

## 2021-12-29 DIAGNOSIS — M48061 Spinal stenosis, lumbar region without neurogenic claudication: Secondary | ICD-10-CM | POA: Diagnosis not present

## 2021-12-29 DIAGNOSIS — R69 Illness, unspecified: Secondary | ICD-10-CM | POA: Diagnosis not present

## 2022-01-02 DIAGNOSIS — E291 Testicular hypofunction: Secondary | ICD-10-CM | POA: Diagnosis not present

## 2022-01-04 DIAGNOSIS — M48062 Spinal stenosis, lumbar region with neurogenic claudication: Secondary | ICD-10-CM | POA: Diagnosis not present

## 2022-01-04 DIAGNOSIS — M5416 Radiculopathy, lumbar region: Secondary | ICD-10-CM | POA: Diagnosis not present

## 2022-01-11 DIAGNOSIS — E291 Testicular hypofunction: Secondary | ICD-10-CM | POA: Diagnosis not present

## 2022-01-16 DIAGNOSIS — E291 Testicular hypofunction: Secondary | ICD-10-CM | POA: Diagnosis not present

## 2022-01-18 DIAGNOSIS — M6281 Muscle weakness (generalized): Secondary | ICD-10-CM | POA: Diagnosis not present

## 2022-01-26 DIAGNOSIS — M5442 Lumbago with sciatica, left side: Secondary | ICD-10-CM | POA: Diagnosis not present

## 2022-01-26 DIAGNOSIS — M48061 Spinal stenosis, lumbar region without neurogenic claudication: Secondary | ICD-10-CM | POA: Diagnosis not present

## 2022-01-26 DIAGNOSIS — R69 Illness, unspecified: Secondary | ICD-10-CM | POA: Diagnosis not present

## 2022-01-26 DIAGNOSIS — I10 Essential (primary) hypertension: Secondary | ICD-10-CM | POA: Diagnosis not present

## 2022-01-26 DIAGNOSIS — E782 Mixed hyperlipidemia: Secondary | ICD-10-CM | POA: Diagnosis not present

## 2022-01-26 DIAGNOSIS — E291 Testicular hypofunction: Secondary | ICD-10-CM | POA: Diagnosis not present

## 2022-01-26 DIAGNOSIS — M5117 Intervertebral disc disorders with radiculopathy, lumbosacral region: Secondary | ICD-10-CM | POA: Diagnosis not present

## 2022-02-01 DIAGNOSIS — E291 Testicular hypofunction: Secondary | ICD-10-CM | POA: Diagnosis not present

## 2022-02-06 DIAGNOSIS — E291 Testicular hypofunction: Secondary | ICD-10-CM | POA: Diagnosis not present

## 2022-02-07 DIAGNOSIS — M5416 Radiculopathy, lumbar region: Secondary | ICD-10-CM | POA: Diagnosis not present

## 2022-02-07 DIAGNOSIS — M48062 Spinal stenosis, lumbar region with neurogenic claudication: Secondary | ICD-10-CM | POA: Diagnosis not present

## 2022-02-17 DIAGNOSIS — M6281 Muscle weakness (generalized): Secondary | ICD-10-CM | POA: Diagnosis not present

## 2022-02-27 DIAGNOSIS — E785 Hyperlipidemia, unspecified: Secondary | ICD-10-CM | POA: Diagnosis not present

## 2022-02-27 DIAGNOSIS — E291 Testicular hypofunction: Secondary | ICD-10-CM | POA: Diagnosis not present

## 2022-02-27 DIAGNOSIS — I10 Essential (primary) hypertension: Secondary | ICD-10-CM | POA: Diagnosis not present

## 2022-02-27 DIAGNOSIS — F32A Depression, unspecified: Secondary | ICD-10-CM | POA: Diagnosis not present

## 2022-02-27 DIAGNOSIS — R69 Illness, unspecified: Secondary | ICD-10-CM | POA: Diagnosis not present

## 2022-02-27 DIAGNOSIS — M5440 Lumbago with sciatica, unspecified side: Secondary | ICD-10-CM | POA: Diagnosis not present

## 2022-03-05 DIAGNOSIS — M48062 Spinal stenosis, lumbar region with neurogenic claudication: Secondary | ICD-10-CM | POA: Diagnosis not present

## 2022-03-05 DIAGNOSIS — M47816 Spondylosis without myelopathy or radiculopathy, lumbar region: Secondary | ICD-10-CM | POA: Diagnosis not present

## 2022-03-05 DIAGNOSIS — M5416 Radiculopathy, lumbar region: Secondary | ICD-10-CM | POA: Diagnosis not present

## 2022-03-20 DIAGNOSIS — M6281 Muscle weakness (generalized): Secondary | ICD-10-CM | POA: Diagnosis not present

## 2022-03-22 DIAGNOSIS — I10 Essential (primary) hypertension: Secondary | ICD-10-CM | POA: Diagnosis not present

## 2022-03-22 DIAGNOSIS — M1990 Unspecified osteoarthritis, unspecified site: Secondary | ICD-10-CM | POA: Diagnosis not present

## 2022-03-22 DIAGNOSIS — M48061 Spinal stenosis, lumbar region without neurogenic claudication: Secondary | ICD-10-CM | POA: Diagnosis not present

## 2022-03-30 DIAGNOSIS — Z6828 Body mass index (BMI) 28.0-28.9, adult: Secondary | ICD-10-CM | POA: Diagnosis not present

## 2022-03-30 DIAGNOSIS — E538 Deficiency of other specified B group vitamins: Secondary | ICD-10-CM | POA: Diagnosis not present

## 2022-03-30 DIAGNOSIS — R69 Illness, unspecified: Secondary | ICD-10-CM | POA: Diagnosis not present

## 2022-03-30 DIAGNOSIS — I10 Essential (primary) hypertension: Secondary | ICD-10-CM | POA: Diagnosis not present

## 2022-03-30 DIAGNOSIS — M1289 Other specific arthropathies, not elsewhere classified, multiple sites: Secondary | ICD-10-CM | POA: Diagnosis not present

## 2022-03-30 DIAGNOSIS — Z0000 Encounter for general adult medical examination without abnormal findings: Secondary | ICD-10-CM | POA: Diagnosis not present

## 2022-03-30 DIAGNOSIS — E299 Testicular dysfunction, unspecified: Secondary | ICD-10-CM | POA: Diagnosis not present

## 2022-03-30 DIAGNOSIS — E785 Hyperlipidemia, unspecified: Secondary | ICD-10-CM | POA: Diagnosis not present

## 2022-04-03 DIAGNOSIS — M47816 Spondylosis without myelopathy or radiculopathy, lumbar region: Secondary | ICD-10-CM | POA: Diagnosis not present

## 2022-04-20 DIAGNOSIS — M6281 Muscle weakness (generalized): Secondary | ICD-10-CM | POA: Diagnosis not present

## 2022-04-21 DIAGNOSIS — E291 Testicular hypofunction: Secondary | ICD-10-CM | POA: Diagnosis not present

## 2022-04-21 DIAGNOSIS — I10 Essential (primary) hypertension: Secondary | ICD-10-CM | POA: Diagnosis not present

## 2022-04-21 DIAGNOSIS — R69 Illness, unspecified: Secondary | ICD-10-CM | POA: Diagnosis not present

## 2022-04-21 DIAGNOSIS — M1A49X0 Other secondary chronic gout, multiple sites, without tophus (tophi): Secondary | ICD-10-CM | POA: Diagnosis not present

## 2022-04-21 DIAGNOSIS — E785 Hyperlipidemia, unspecified: Secondary | ICD-10-CM | POA: Diagnosis not present

## 2022-04-21 DIAGNOSIS — J449 Chronic obstructive pulmonary disease, unspecified: Secondary | ICD-10-CM | POA: Diagnosis not present

## 2022-04-26 DIAGNOSIS — E785 Hyperlipidemia, unspecified: Secondary | ICD-10-CM | POA: Diagnosis not present

## 2022-04-26 DIAGNOSIS — M1A49X0 Other secondary chronic gout, multiple sites, without tophus (tophi): Secondary | ICD-10-CM | POA: Diagnosis not present

## 2022-04-26 DIAGNOSIS — E291 Testicular hypofunction: Secondary | ICD-10-CM | POA: Diagnosis not present

## 2022-04-26 DIAGNOSIS — J449 Chronic obstructive pulmonary disease, unspecified: Secondary | ICD-10-CM | POA: Diagnosis not present

## 2022-04-26 DIAGNOSIS — F419 Anxiety disorder, unspecified: Secondary | ICD-10-CM | POA: Diagnosis not present

## 2022-04-26 DIAGNOSIS — D519 Vitamin B12 deficiency anemia, unspecified: Secondary | ICD-10-CM | POA: Diagnosis not present

## 2022-04-26 DIAGNOSIS — R69 Illness, unspecified: Secondary | ICD-10-CM | POA: Diagnosis not present

## 2022-04-26 DIAGNOSIS — I10 Essential (primary) hypertension: Secondary | ICD-10-CM | POA: Diagnosis not present

## 2022-05-08 DIAGNOSIS — M47816 Spondylosis without myelopathy or radiculopathy, lumbar region: Secondary | ICD-10-CM | POA: Diagnosis not present

## 2022-05-20 DIAGNOSIS — M6281 Muscle weakness (generalized): Secondary | ICD-10-CM | POA: Diagnosis not present

## 2022-05-21 ENCOUNTER — Encounter (INDEPENDENT_AMBULATORY_CARE_PROVIDER_SITE_OTHER): Payer: Self-pay

## 2022-05-25 DIAGNOSIS — R69 Illness, unspecified: Secondary | ICD-10-CM | POA: Diagnosis not present

## 2022-05-25 DIAGNOSIS — F32A Depression, unspecified: Secondary | ICD-10-CM | POA: Diagnosis not present

## 2022-05-25 DIAGNOSIS — F411 Generalized anxiety disorder: Secondary | ICD-10-CM | POA: Diagnosis not present

## 2022-05-25 DIAGNOSIS — M5440 Lumbago with sciatica, unspecified side: Secondary | ICD-10-CM | POA: Diagnosis not present

## 2022-05-25 DIAGNOSIS — E291 Testicular hypofunction: Secondary | ICD-10-CM | POA: Diagnosis not present

## 2022-05-25 DIAGNOSIS — E538 Deficiency of other specified B group vitamins: Secondary | ICD-10-CM | POA: Diagnosis not present

## 2022-06-20 DIAGNOSIS — M6281 Muscle weakness (generalized): Secondary | ICD-10-CM | POA: Diagnosis not present

## 2022-06-26 DIAGNOSIS — E559 Vitamin D deficiency, unspecified: Secondary | ICD-10-CM | POA: Diagnosis not present

## 2022-06-26 DIAGNOSIS — E785 Hyperlipidemia, unspecified: Secondary | ICD-10-CM | POA: Diagnosis not present

## 2022-06-26 DIAGNOSIS — E291 Testicular hypofunction: Secondary | ICD-10-CM | POA: Diagnosis not present

## 2022-06-26 DIAGNOSIS — M1A49X0 Other secondary chronic gout, multiple sites, without tophus (tophi): Secondary | ICD-10-CM | POA: Diagnosis not present

## 2022-06-26 DIAGNOSIS — I872 Venous insufficiency (chronic) (peripheral): Secondary | ICD-10-CM | POA: Diagnosis not present

## 2022-06-26 DIAGNOSIS — E538 Deficiency of other specified B group vitamins: Secondary | ICD-10-CM | POA: Diagnosis not present

## 2022-06-26 DIAGNOSIS — J449 Chronic obstructive pulmonary disease, unspecified: Secondary | ICD-10-CM | POA: Diagnosis not present

## 2022-06-26 DIAGNOSIS — I10 Essential (primary) hypertension: Secondary | ICD-10-CM | POA: Diagnosis not present

## 2022-06-26 DIAGNOSIS — R69 Illness, unspecified: Secondary | ICD-10-CM | POA: Diagnosis not present

## 2022-06-26 DIAGNOSIS — M5440 Lumbago with sciatica, unspecified side: Secondary | ICD-10-CM | POA: Diagnosis not present

## 2022-07-03 DIAGNOSIS — M47816 Spondylosis without myelopathy or radiculopathy, lumbar region: Secondary | ICD-10-CM | POA: Diagnosis not present

## 2022-07-20 DIAGNOSIS — M6281 Muscle weakness (generalized): Secondary | ICD-10-CM | POA: Diagnosis not present

## 2022-07-21 DIAGNOSIS — M1990 Unspecified osteoarthritis, unspecified site: Secondary | ICD-10-CM | POA: Diagnosis not present

## 2022-07-21 DIAGNOSIS — M48061 Spinal stenosis, lumbar region without neurogenic claudication: Secondary | ICD-10-CM | POA: Diagnosis not present

## 2022-07-21 DIAGNOSIS — I10 Essential (primary) hypertension: Secondary | ICD-10-CM | POA: Diagnosis not present

## 2022-07-24 DIAGNOSIS — E291 Testicular hypofunction: Secondary | ICD-10-CM | POA: Diagnosis not present

## 2022-07-24 DIAGNOSIS — D519 Vitamin B12 deficiency anemia, unspecified: Secondary | ICD-10-CM | POA: Diagnosis not present

## 2022-07-24 DIAGNOSIS — B351 Tinea unguium: Secondary | ICD-10-CM | POA: Diagnosis not present

## 2022-07-24 DIAGNOSIS — R69 Illness, unspecified: Secondary | ICD-10-CM | POA: Diagnosis not present

## 2022-08-10 ENCOUNTER — Other Ambulatory Visit (HOSPITAL_COMMUNITY): Payer: Self-pay

## 2022-08-20 DIAGNOSIS — M6281 Muscle weakness (generalized): Secondary | ICD-10-CM | POA: Diagnosis not present

## 2022-08-27 DIAGNOSIS — M47816 Spondylosis without myelopathy or radiculopathy, lumbar region: Secondary | ICD-10-CM | POA: Diagnosis not present

## 2022-08-27 DIAGNOSIS — M79672 Pain in left foot: Secondary | ICD-10-CM | POA: Diagnosis not present

## 2022-08-27 DIAGNOSIS — M5416 Radiculopathy, lumbar region: Secondary | ICD-10-CM | POA: Diagnosis not present

## 2022-08-27 DIAGNOSIS — M48062 Spinal stenosis, lumbar region with neurogenic claudication: Secondary | ICD-10-CM | POA: Diagnosis not present

## 2022-08-30 ENCOUNTER — Telehealth: Payer: Self-pay

## 2022-08-30 NOTE — Telephone Encounter (Signed)
Smoking Cessation Review Call  Smoking Cessation Review ([place]) Chart Review What recent interventions have been made by any provider to improve the patient's conditions in the last 3 months?: Office Visit: 07/24/22 Mechele Claude. NP-C. For toes tingling and numbness. STOPPED Ergocalciferol, Naloxone, Oxycodone, Testosterone, and Tizandine. Has there been any documented recent hospitalizations or ED visits since last visit with Clinical Lead?: No  Adherence Review Does the [facility] have access to medication refill data?: Yes Adherence rates for STAR metric medications: None, Adherence rates for medications indicated for disease state being reviewed: None. Does the patient have >5 day gap between last estimated fill dates for any of the above medications?: No  Disease State Questions Able to connect with the Patient?: Yes Does the patient currently smoke?: Yes How much are you smoking per day?: 3 cigs a day  How do you feel about quitting smoking?: Working on quitting What changes have you made to help with quitting smoking?: Patient stated self control What are your goals for quitting smoking? (Encourage Patient to verbalize specific, measurable, attainable, relevant and time-based (SMART) goals - i.e. I want to reduce my smoking to  ppd by [facility] Day.): Patient stated he has not set any goals at this time . What are your concerns about quitting?: Patient stated he does not know, stated he just thinks he needs them for some odd reason. What might help with those concerns?: Patient stated he can quit on his own, he doesn't need help. When you have tried to quit in the past, what things (people, programs, behavior changes) have helped?: Patient stated he might have quit one his own time . What additional information or assistance would be beneficial to help you successfully quit smoking?: Patient stated none . HC: Patient thanked for answering questions.  Patient advised clinical lead will review  answers and follow-up with patient.: Done [facility] Chart Review: 8 min 08/29/22  HC Assessment call time spent: 7 min 08/29/22   Clinical Lead Review Review Adherence gaps identified?: No Drug Therapy Problems identified?: No Assessment: Controlled . Jerral Ralph, PharmD  Review 30mns 15secs

## 2022-09-14 ENCOUNTER — Other Ambulatory Visit: Payer: Self-pay | Admitting: Family

## 2022-09-17 ENCOUNTER — Other Ambulatory Visit: Payer: Self-pay

## 2022-09-19 ENCOUNTER — Other Ambulatory Visit: Payer: Self-pay | Admitting: Family

## 2022-09-20 ENCOUNTER — Other Ambulatory Visit: Payer: Self-pay

## 2022-09-20 ENCOUNTER — Other Ambulatory Visit: Payer: Self-pay | Admitting: Family

## 2022-09-20 DIAGNOSIS — M6281 Muscle weakness (generalized): Secondary | ICD-10-CM | POA: Diagnosis not present

## 2022-09-20 MED ORDER — OXYCODONE HCL 5 MG PO TABS: 5.0000 mg | ORAL_TABLET | Freq: Three times a day (TID) | ORAL | 0 refills | Status: DC | PRN

## 2022-09-21 ENCOUNTER — Ambulatory Visit (INDEPENDENT_AMBULATORY_CARE_PROVIDER_SITE_OTHER): Payer: Medicare HMO | Admitting: Family

## 2022-09-21 VITALS — BP 128/68 | HR 99 | Ht 72.0 in | Wt 237.0 lb

## 2022-09-21 DIAGNOSIS — B351 Tinea unguium: Secondary | ICD-10-CM

## 2022-09-21 DIAGNOSIS — L602 Onychogryphosis: Secondary | ICD-10-CM

## 2022-09-25 ENCOUNTER — Encounter: Payer: Self-pay | Admitting: Family

## 2022-09-25 NOTE — Progress Notes (Signed)
Established Patient Office Visit  Subjective:  Patient ID: Lance House, male    DOB: 08/04/1950  Age: 72 y.o. MRN: [id number]  Chief Complaint  Patient presents with   Follow-up    Follow up    Pt. Here for his 1 month f/u.  He is doing well in general today, says that he has appointments with Dr Sharlet Salina to help with his back.  They are hoping to get him some relief from his chronic pain.   No other concerns today.      Past Medical History:  Diagnosis Date   Anxiety    Closed fracture of distal fibula 02/13/2021   Depression    Gout    Injury of right ankle 02/13/2021    Past Surgical History:  Procedure Laterality Date   APPENDECTOMY  1994   INGUINAL HERNIA REPAIR Left 04/13/2019   Procedure: HERNIA REPAIR INGUINAL ADULT OPEN, LEFT;  Surgeon: Fredirick Maudlin, MD;  Location: [facility] ORS;  Service: General;  Laterality: Left;   [facility]  [facility]   VIDEO ASSISTED THORACOSCOPY (VATS)/DECORTICATION Left 09/11/2021   Procedure: VIDEO ASSISTED THORACOSCOPY (VATS)/DECORTICATION;  Surgeon: Melrose Nakayama, MD;  Location: [facility] OR;  Service: Thoracic;  Laterality: Left;    Social History   Socioeconomic History   Marital status: Married    Spouse name: Not on file   Number of children: Not on file   Years of education: Not on file   Highest education level: Not on file  Occupational History   Not on file  Tobacco Use   Smoking status: Former    Packs/day: 2.00    Types: Cigarettes   Smokeless tobacco: Never  Vaping Use   Vaping Use: Never used  Substance and Sexual Activity   Alcohol use: Not Currently    Alcohol/week: 42.0 standard drinks of alcohol    Types: 42 Cans of beer per week    Comment: quit around 05/2021   Drug use: Not Currently   Sexual activity: Not on file  Other Topics Concern   Not on file  Social History Narrative   Not on file   Social Determinants of Health   Financial Resource Strain: Not on file  Food Insecurity:  Not on file  Transportation Needs: Not on file  Physical Activity: Not on file  Stress: Not on file  Social Connections: Not on file  Intimate Partner Violence: Not on file    Family History  Problem Relation Age of Onset   Skin cancer Father    Multiple sclerosis Sister    Non-Hodgkin's lymphoma Brother     Allergies  Allergen Reactions   Ciprocin-Fluocin-Procin [Fluocinolone] Anaphylaxis    Hip hurt   Ciprofloxacin Other (See Comments)    Unknown reaction   Colchicine Other (See Comments)   Ezetimibe Nausea And Vomiting   Meloxicam Diarrhea and Swelling   Duloxetine Diarrhea, Nausea And Vomiting and Other (See Comments)    Altered mental status    Review of Systems  All other systems reviewed and are negative.      Objective:   BP 128/68   Pulse 99   Ht 6' (1.829 m)   Wt 237 lb (107.5 kg)   SpO2 94%   BMI 32.14 kg/m   Vitals:   09/21/22 1101  BP: 128/68  Pulse: 99  Height: 6' (1.829 m)  Weight: 237 lb (107.5 kg)  SpO2: 94%  BMI (Calculated): 32.14    Physical Exam Vitals and nursing  note reviewed.  Constitutional:      Appearance: Normal appearance. He is normal weight.  Eyes:     Pupils: Pupils are equal, round, and reactive to light.  Cardiovascular:     Rate and Rhythm: Normal rate and regular rhythm.     Pulses: Normal pulses.     Heart sounds: Normal heart sounds.  Pulmonary:     Effort: Pulmonary effort is normal.     Breath sounds: Normal breath sounds.  Neurological:     Mental Status: He is alert and oriented to person, place, and time.     Gait: Gait abnormal (shuffling).  Psychiatric:        Mood and Affect: Mood normal.        Behavior: Behavior normal.      No results found for any visits on 09/21/22.  No results found for this or any previous visit (from the past 2160 hour(s)).    Assessment & Plan:   Problem List Items Addressed This Visit   None Visit Diagnoses     Hypertrophy of nail    -  Primary   Relevant  Orders   Ambulatory referral to Podiatry   Tinea unguium       Relevant Orders   Ambulatory referral to Podiatry       Return in about 1 month (around 10/22/2022).   Total time spent: 20 minutes  Mechele Claude, FNP  09/21/2022

## 2022-10-03 ENCOUNTER — Telehealth: Payer: Self-pay

## 2022-10-03 NOTE — Telephone Encounter (Signed)
HLD Review Call  Lance House,Lance House  75 years, Male  DOB: Dec 12, 1950  M: [phone]  __________________________________________________ Hyperlipidemia/Dyslipidemia Review (HC) Chart Review Lipid Panel Date: 06/26/2022 TC: 193 LDL: 131 HDL: 42 TG: 113 Are TC > 200, LDL >100, HDL < 40, TG > 150?: Yes What recent interventions have been made by any provider to improve the patient's conditions in the last 3 months?: Consults: 08/27/22 Podiatry Lance House, Lance Crumble, DO. For left foot pain. No medication changes.  Office Visit: 09/21/22 Lance Claude, Lance House. For follow-up. No medication changes. Has there been any documented recent hospitalizations or ED visits since last visit with Clinical Lead?: No Adherence Review Does the [facility] have access to medication refill data?: Yes Adherence rates for STAR metric medications: None. Adherence rates for medications indicated for disease state being reviewed: None. Does the patient have >5 day gap between last estimated fill dates for any of the above medications?: No Disease State Questions Able to connect with the Patient?: Yes Are you having any side effects from your cholesterol medications?: No What diet changes have you made to improve your Cholesterol?: other Details: None.  What exercise are you doing to improve your Cholesterol?: other Details: Leg exercises in the house  Engagement Notes Lance House, Lance House on 10/02/2022 12:57 PM [facility] Chart Review: 15 min 10/02/22  HC Assessment call time spent: 5 min 10/02/22   Clinical Lead Review Review Adherence gaps identified?: No Drug Therapy Problems identified?: No Assessment: Uncontrolled Plan: LDL is not at goal. He refuses to try statin due to news about muscle pain out there. Will ask him for once a week statin at next visit in april Engagement Notes Lance House on 10/03/2022 04:10 PM Reviewed: 76mns

## 2022-10-15 ENCOUNTER — Other Ambulatory Visit: Payer: Self-pay | Admitting: Family

## 2022-10-16 ENCOUNTER — Other Ambulatory Visit: Payer: Self-pay

## 2022-10-17 MED ORDER — OXYCODONE HCL 5 MG PO TABS: 5.0000 mg | ORAL_TABLET | Freq: Three times a day (TID) | ORAL | 0 refills | Status: DC | PRN

## 2022-10-19 DIAGNOSIS — M6281 Muscle weakness (generalized): Secondary | ICD-10-CM | POA: Diagnosis not present

## 2022-10-21 DIAGNOSIS — M1990 Unspecified osteoarthritis, unspecified site: Secondary | ICD-10-CM | POA: Diagnosis not present

## 2022-10-21 DIAGNOSIS — M48061 Spinal stenosis, lumbar region without neurogenic claudication: Secondary | ICD-10-CM | POA: Diagnosis not present

## 2022-10-21 DIAGNOSIS — I10 Essential (primary) hypertension: Secondary | ICD-10-CM | POA: Diagnosis not present

## 2022-10-22 ENCOUNTER — Encounter: Payer: Self-pay | Admitting: Family

## 2022-10-22 ENCOUNTER — Ambulatory Visit (INDEPENDENT_AMBULATORY_CARE_PROVIDER_SITE_OTHER): Payer: Medicare HMO | Admitting: Family

## 2022-10-22 VITALS — BP 130/90 | HR 99 | Ht 73.0 in | Wt 239.8 lb

## 2022-10-22 DIAGNOSIS — M5441 Lumbago with sciatica, right side: Secondary | ICD-10-CM

## 2022-10-22 DIAGNOSIS — E538 Deficiency of other specified B group vitamins: Secondary | ICD-10-CM | POA: Diagnosis not present

## 2022-10-22 DIAGNOSIS — R7303 Prediabetes: Secondary | ICD-10-CM

## 2022-10-22 DIAGNOSIS — G8929 Other chronic pain: Secondary | ICD-10-CM | POA: Diagnosis not present

## 2022-10-22 DIAGNOSIS — M5442 Lumbago with sciatica, left side: Secondary | ICD-10-CM | POA: Diagnosis not present

## 2022-10-22 DIAGNOSIS — E782 Mixed hyperlipidemia: Secondary | ICD-10-CM

## 2022-10-22 DIAGNOSIS — R5383 Other fatigue: Secondary | ICD-10-CM

## 2022-10-22 DIAGNOSIS — E559 Vitamin D deficiency, unspecified: Secondary | ICD-10-CM | POA: Diagnosis not present

## 2022-10-22 DIAGNOSIS — E291 Testicular hypofunction: Secondary | ICD-10-CM | POA: Diagnosis not present

## 2022-10-22 MED ORDER — CYANOCOBALAMIN 1000 MCG/ML IJ SOLN
1000.0000 ug | Freq: Once | INTRAMUSCULAR | Status: AC
Start: 2022-10-22 — End: 2022-10-22
  Administered 2022-10-22: 1000 ug via INTRAMUSCULAR

## 2022-10-22 MED ORDER — CYANOCOBALAMIN 1000 MCG/ML IJ SOLN
1000.0000 ug | Freq: Once | INTRAMUSCULAR | Status: DC
Start: 2022-10-22 — End: 2022-10-22

## 2022-10-22 MED ORDER — OXYCODONE HCL 5 MG PO TABS: 5.0000 mg | ORAL_TABLET | Freq: Three times a day (TID) | ORAL | 0 refills | Status: DC | PRN

## 2022-10-22 NOTE — Progress Notes (Unsigned)
Established Patient Office Visit  Subjective:  Patient ID: Lance House, male    DOB: 08-26-1950  Age: 72 y.o. MRN: [id number]  No chief complaint on file.   Patient is here today for his 1 month follow up.  He has been feeling poorly since last appointment.   He does have additional concerns to discuss today.  Labs are due today. He needs refills.   I have reviewed his active problem list, medication list, allergies, family history, notes from last encounter, and lab results for his appointment today.     No other concerns at this time.   Past Medical History:  Diagnosis Date   Anxiety    Closed fracture of distal fibula 02/13/2021   Depression    Gout    Injury of right ankle 02/13/2021    Past Surgical History:  Procedure Laterality Date   APPENDECTOMY  1994   INGUINAL HERNIA REPAIR Left 04/13/2019   Procedure: HERNIA REPAIR INGUINAL ADULT OPEN, LEFT;  Surgeon: Fredirick Maudlin, MD;  Location: [facility] ORS;  Service: General;  Laterality: Left;   [facility]GERY  [facility]   VIDEO ASSISTED THORACOSCOPY (VATS)/DECORTICATION Left 09/11/2021   Procedure: VIDEO ASSISTED THORACOSCOPY (VATS)/DECORTICATION;  Surgeon: Melrose Nakayama, MD;  Location: [facility] OR;  Service: Thoracic;  Laterality: Left;    Social History   Socioeconomic History   Marital status: Married    Spouse name: Not on file   Number of children: Not on file   Years of education: Not on file   Highest education level: Not on file  Occupational History   Not on file  Tobacco Use   Smoking status: Former    Packs/day: 2    Types: Cigarettes   Smokeless tobacco: Never  Vaping Use   Vaping Use: Never used  Substance and Sexual Activity   Alcohol use: Not Currently    Alcohol/week: 42.0 standard drinks of alcohol    Types: 42 Cans of beer per week    Comment: quit around 05/2021   Drug use: Not Currently   Sexual activity: Not on file  Other Topics Concern   Not on file  Social History  Narrative   Not on file   Social Determinants of Health   Financial Resource Strain: Not on file  Food Insecurity: Not on file  Transportation Needs: Not on file  Physical Activity: Not on file  Stress: Not on file  Social Connections: Not on file  Intimate Partner Violence: Not on file    Family History  Problem Relation Age of Onset   Skin cancer Father    Multiple sclerosis Sister    Non-Hodgkin's lymphoma Brother     Allergies  Allergen Reactions   Ciprocin-Fluocin-Procin [Fluocinolone] Anaphylaxis    Hip hurt   Ciprofloxacin Other (See Comments)    Unknown reaction   Colchicine Other (See Comments)   Ezetimibe Nausea And Vomiting   Meloxicam Diarrhea and Swelling   Duloxetine Diarrhea, Nausea And Vomiting and Other (See Comments)    Altered mental status    Review of Systems  Constitutional:  Positive for malaise/fatigue.  Respiratory:  Positive for cough.   Musculoskeletal:  Positive for back pain and joint pain.  Psychiatric/Behavioral:  The patient is nervous/anxious.   All other systems reviewed and are negative.      Objective:   BP (!) 130/90   Pulse 99   Ht 6\' 1"  (1.854 m)   Wt 239 lb 12.8 oz (108.8 kg)  SpO2 96%   BMI 31.64 kg/m   Vitals:   10/22/22 1048  BP: (!) 130/90  Pulse: 99  Height: 6\' 1"  (1.854 m)  Weight: 239 lb 12.8 oz (108.8 kg)  SpO2: 96%  BMI (Calculated): 31.64    Physical Exam Vitals and nursing note reviewed.  Constitutional:      Appearance: Normal appearance. He is normal weight.  Eyes:     Pupils: Pupils are equal, round, and reactive to light.  Cardiovascular:     Rate and Rhythm: Normal rate and regular rhythm.     Pulses: Normal pulses.     Heart sounds: Normal heart sounds.  Pulmonary:     Effort: Pulmonary effort is normal.     Breath sounds: Normal breath sounds.  Musculoskeletal:     Lumbar back: Spasms and tenderness present. Positive right straight leg raise test and positive left straight leg  raise test.     Right hip: Decreased strength.     Left hip: Decreased strength.  Neurological:     Mental Status: He is alert and oriented to person, place, and time.     Gait: Gait abnormal.  Psychiatric:        Attention and Perception: Attention and perception normal.        Mood and Affect: Mood is depressed. Affect is flat.        Speech: Speech normal.        Behavior: Behavior normal. Behavior is cooperative.        Thought Content: Thought content normal.        Cognition and Memory: Cognition normal.        Judgment: Judgment normal.      Results for orders placed or performed in visit on 10/22/22  Lipid panel  Result Value Ref Range   Cholesterol, Total 238 (H) 100 - 199 mg/dL   Triglycerides 278 (H) 0 - 149 mg/dL   HDL 38 (L) >39 mg/dL   VLDL Cholesterol Cal 51 (H) 5 - 40 mg/dL   LDL Chol Calc (NIH) 149 (H) 0 - 99 mg/dL   Chol/HDL Ratio 6.3 (H) 0.0 - 5.0 ratio  VITAMIN D 25 Hydroxy (Vit-D Deficiency, Fractures)  Result Value Ref Range   Vit D, 25-Hydroxy 62.2 30.0 - 100.0 ng/mL  CBC With Differential  Result Value Ref Range   WBC 7.6 3.4 - 10.8 x10E3/uL   RBC 6.00 (H) 4.14 - 5.80 x10E6/uL   Hemoglobin 17.9 (H) 13.0 - 17.7 g/dL   Hematocrit 53.0 (H) 37.5 - 51.0 %   MCV 88 79 - 97 fL   MCH 29.8 26.6 - 33.0 pg   MCHC 33.8 31.5 - 35.7 g/dL   RDW 14.3 11.6 - 15.4 %   Neutrophils 60 Not Estab. %   Lymphs 26 Not Estab. %   Monocytes 7 Not Estab. %   Eos 6 Not Estab. %   Basos 1 Not Estab. %   Neutrophils Absolute 4.5 1.4 - 7.0 x10E3/uL   Lymphocytes Absolute 2.0 0.7 - 3.1 x10E3/uL   Monocytes Absolute 0.6 0.1 - 0.9 x10E3/uL   EOS (ABSOLUTE) 0.5 (H) 0.0 - 0.4 x10E3/uL   Basophils Absolute 0.1 0.0 - 0.2 x10E3/uL   Immature Granulocytes 0 Not Estab. %   Immature Grans (Abs) 0.0 0.0 - 0.1 x10E3/uL  CMP14+EGFR  Result Value Ref Range   Glucose 103 (H) 70 - 99 mg/dL   BUN 5 (L) 8 - 27 mg/dL   Creatinine, Ser 0.74 (L) 0.76 -  1.27 mg/dL   eGFR 98 >59 mL/min/1.73    BUN/Creatinine Ratio 7 (L) 10 - 24   Sodium 135 134 - 144 mmol/L   Potassium 4.3 3.5 - 5.2 mmol/L   Chloride 98 96 - 106 mmol/L   CO2 22 20 - 29 mmol/L   Calcium 9.3 8.6 - 10.2 mg/dL   Total Protein 6.6 6.0 - 8.5 g/dL   Albumin 4.3 3.9 - 4.9 g/dL   Globulin, Total 2.3 1.5 - 4.5 g/dL   Albumin/Globulin Ratio 1.9 1.2 - 2.2   Bilirubin Total 0.8 0.0 - 1.2 mg/dL   Alkaline Phosphatase 83 44 - 121 IU/L   AST 14 0 - 40 IU/L   ALT 10 0 - 44 IU/L  Hemoglobin A1c  Result Value Ref Range   Hgb A1c MFr Bld 6.5 (H) 4.8 - 5.6 %   Est. average glucose Bld gHb Est-mCnc 140 mg/dL  Vitamin B12  Result Value Ref Range   Vitamin B-12 357 232 - 1,245 pg/mL  Testosterone, Total  Result Value Ref Range   Testosterone 441 264 - 916 ng/dL    Recent Results (from the past 2160 hour(s))  Lipid panel     Status: Abnormal   Collection Time: 10/22/22 11:43 AM  Result Value Ref Range   Cholesterol, Total 238 (H) 100 - 199 mg/dL   Triglycerides 278 (H) 0 - 149 mg/dL   HDL 38 (L) >39 mg/dL   VLDL Cholesterol Cal 51 (H) 5 - 40 mg/dL   LDL Chol Calc (NIH) 149 (H) 0 - 99 mg/dL   Chol/HDL Ratio 6.3 (H) 0.0 - 5.0 ratio    Comment:                                   T. Chol/HDL Ratio                                             Men  Women                               1/2 Avg.Risk  3.4    3.3                                   Avg.Risk  5.0    4.4                                2X Avg.Risk  9.6    7.1                                3X Avg.Risk 23.4   11.0   VITAMIN D 25 Hydroxy (Vit-D Deficiency, Fractures)     Status: None   Collection Time: 10/22/22 11:43 AM  Result Value Ref Range   Vit D, 25-Hydroxy 62.2 30.0 - 100.0 ng/mL    Comment: Vitamin D deficiency has been defined by the [place] practice guideline as a level of serum 25-OH vitamin D less than 20 ng/mL (1,2). The Endocrine Society went on to further  define vitamin D insufficiency as a level between 21  and 29 ng/mL (2). 1. IOM (Institute of Medicine). 2010. Dietary reference    intakes for calcium and D. [place]: The    [employer]. 2. Holick MF, Binkley Eudora, Bischoff-Ferrari HA, et al.    Evaluation, treatment, and prevention of vitamin D    deficiency: an Endocrine Society clinical practice    guideline. JCEM. 2011 Jul; 96(7):1911-30.   CBC With Differential     Status: Abnormal   Collection Time: 10/22/22 11:43 AM  Result Value Ref Range   WBC 7.6 3.4 - 10.8 x10E3/uL   RBC 6.00 (H) 4.14 - 5.80 x10E6/uL   Hemoglobin 17.9 (H) 13.0 - 17.7 g/dL   Hematocrit 53.0 (H) 37.5 - 51.0 %   MCV 88 79 - 97 fL   MCH 29.8 26.6 - 33.0 pg   MCHC 33.8 31.5 - 35.7 g/dL   RDW 14.3 11.6 - 15.4 %   Neutrophils 60 Not Estab. %   Lymphs 26 Not Estab. %   Monocytes 7 Not Estab. %   Eos 6 Not Estab. %   Basos 1 Not Estab. %   Neutrophils Absolute 4.5 1.4 - 7.0 x10E3/uL   Lymphocytes Absolute 2.0 0.7 - 3.1 x10E3/uL   Monocytes Absolute 0.6 0.1 - 0.9 x10E3/uL   EOS (ABSOLUTE) 0.5 (H) 0.0 - 0.4 x10E3/uL   Basophils Absolute 0.1 0.0 - 0.2 x10E3/uL   Immature Granulocytes 0 Not Estab. %   Immature Grans (Abs) 0.0 0.0 - 0.1 x10E3/uL  CMP14+EGFR     Status: Abnormal   Collection Time: 10/22/22 11:43 AM  Result Value Ref Range   Glucose 103 (H) 70 - 99 mg/dL   BUN 5 (L) 8 - 27 mg/dL   Creatinine, Ser 0.74 (L) 0.76 - 1.27 mg/dL   eGFR 98 >59 mL/min/1.73   BUN/Creatinine Ratio 7 (L) 10 - 24   Sodium 135 134 - 144 mmol/L   Potassium 4.3 3.5 - 5.2 mmol/L   Chloride 98 96 - 106 mmol/L   CO2 22 20 - 29 mmol/L   Calcium 9.3 8.6 - 10.2 mg/dL   Total Protein 6.6 6.0 - 8.5 g/dL   Albumin 4.3 3.9 - 4.9 g/dL   Globulin, Total 2.3 1.5 - 4.5 g/dL   Albumin/Globulin Ratio 1.9 1.2 - 2.2   Bilirubin Total 0.8 0.0 - 1.2 mg/dL   Alkaline Phosphatase 83 44 - 121 IU/L   AST 14 0 - 40 IU/L   ALT 10 0 - 44 IU/L  Hemoglobin A1c     Status: Abnormal   Collection Time: 10/22/22 11:43 AM  Result  Value Ref Range   Hgb A1c MFr Bld 6.5 (H) 4.8 - 5.6 %    Comment:          Prediabetes: 5.7 - 6.4          Diabetes: >6.4          Glycemic control for adults with diabetes: <7.0    Est. average glucose Bld gHb Est-mCnc 140 mg/dL  Vitamin B12     Status: None   Collection Time: 10/22/22 11:43 AM  Result Value Ref Range   Vitamin B-12 357 232 - 1,245 pg/mL  Testosterone, Total     Status: None   Collection Time: 10/22/22 11:43 AM  Result Value Ref Range   Testosterone 441 264 - 916 ng/dL    Comment: Adult male reference interval is based on a population of healthy nonobese males (  BMI <30) between 45 and 40 years old. [place], Melvin [id number]. PMID: [id number].       Assessment & Plan:   Problem List Items Addressed This Visit     Mixed hyperlipidemia    Checking labs today.  Continue current therapy for lipid control. Will modify as needed based on labwork results.       Relevant Orders   Lipid panel (Completed)   Other fatigue   Relevant Orders   CBC With Differential (Completed)   CMP14+EGFR (Completed)   Vitamin D deficiency, unspecified   Relevant Orders   Lipid panel (Completed)   VITAMIN D 25 Hydroxy (Vit-D Deficiency, Fractures) (Completed)   Testicular hypofunction    Checking levels in office today.  May resume injections if needed.        Relevant Orders   Testosterone, Total (Completed)   Prediabetes    A1C checked today.  Will adjust therapy as needed.        Relevant Orders   Hemoglobin A1c (Completed)   B12 deficiency due to diet - Primary    Checking B12 level in office today.  B12 injection given in office today as well.        Relevant Orders   Vitamin B12 (Completed)   Chronic bilateral low back pain with bilateral sciatica    Sending pain medication refill.  Patient has been informed of prescribing changes regarding pain medications.  Notice signed by patient and myself at visit today.  Will send referral for pain  clinic for continuation of treatment.       Relevant Medications   oxyCODONE (OXY IR/ROXICODONE) 5 MG immediate release tablet   Other Relevant Orders   Ambulatory referral to Pain Clinic    Return in about 1 month (around 11/21/2022) for F/U.   Total time spent: 30 minutes  Mechele Claude, FNP  10/22/2022

## 2022-10-23 LAB — CMP14+EGFR
ALT: 10 IU/L (ref 0–44)
AST: 14 IU/L (ref 0–40)
Albumin/Globulin Ratio: 1.9 (ref 1.2–2.2)
Albumin: 4.3 g/dL (ref 3.9–4.9)
Alkaline Phosphatase: 83 IU/L (ref 44–121)
BUN/Creatinine Ratio: 7 — ABNORMAL LOW (ref 10–24)
BUN: 5 mg/dL — ABNORMAL LOW (ref 8–27)
Bilirubin Total: 0.8 mg/dL (ref 0.0–1.2)
CO2: 22 mmol/L (ref 20–29)
Calcium: 9.3 mg/dL (ref 8.6–10.2)
Chloride: 98 mmol/L (ref 96–106)
Creatinine, Ser: 0.74 mg/dL — ABNORMAL LOW (ref 0.76–1.27)
Globulin, Total: 2.3 g/dL (ref 1.5–4.5)
Glucose: 103 mg/dL — ABNORMAL HIGH (ref 70–99)
Potassium: 4.3 mmol/L (ref 3.5–5.2)
Sodium: 135 mmol/L (ref 134–144)
Total Protein: 6.6 g/dL (ref 6.0–8.5)
eGFR: 98 mL/min/{1.73_m2} (ref >59)

## 2022-10-23 LAB — CBC WITH DIFFERENTIAL
Basophils Absolute: 0.1 10*3/uL (ref 0.0–0.2)
Basos: 1 %
EOS (ABSOLUTE): 0.5 10*3/uL — ABNORMAL HIGH (ref 0.0–0.4)
Eos: 6 %
Hematocrit: 53 % — ABNORMAL HIGH (ref 37.5–51.0)
Hemoglobin: 17.9 g/dL — ABNORMAL HIGH (ref 13.0–17.7)
Immature Grans (Abs): 0 10*3/uL (ref 0.0–0.1)
Immature Granulocytes: 0 %
Lymphocytes Absolute: 2 10*3/uL (ref 0.7–3.1)
Lymphs: 26 %
MCH: 29.8 pg (ref 26.6–33.0)
MCHC: 33.8 g/dL (ref 31.5–35.7)
MCV: 88 fL (ref 79–97)
Monocytes Absolute: 0.6 10*3/uL (ref 0.1–0.9)
Monocytes: 7 %
Neutrophils Absolute: 4.5 10*3/uL (ref 1.4–7.0)
Neutrophils: 60 %
RBC: 6 x10E6/uL — ABNORMAL HIGH (ref 4.14–5.80)
RDW: 14.3 % (ref 11.6–15.4)
WBC: 7.6 10*3/uL (ref 3.4–10.8)

## 2022-10-23 LAB — LIPID PANEL
Chol/HDL Ratio: 6.3 ratio — ABNORMAL HIGH (ref 0.0–5.0)
Cholesterol, Total: 238 mg/dL — ABNORMAL HIGH (ref 100–199)
HDL: 38 mg/dL — ABNORMAL LOW (ref >39)
LDL Chol Calc (NIH): 149 mg/dL — ABNORMAL HIGH (ref 0–99)
Triglycerides: 278 mg/dL — ABNORMAL HIGH (ref 0–149)
VLDL Cholesterol Cal: 51 mg/dL — ABNORMAL HIGH (ref 5–40)

## 2022-10-23 LAB — VITAMIN B12: Vitamin B-12: 357 pg/mL (ref 232–1245)

## 2022-10-23 LAB — VITAMIN D 25 HYDROXY (VIT D DEFICIENCY, FRACTURES): Vit D, 25-Hydroxy: 62.2 ng/mL (ref 30.0–100.0)

## 2022-10-23 LAB — HEMOGLOBIN A1C
Est. average glucose Bld gHb Est-mCnc: 140 mg/dL
Hgb A1c MFr Bld: 6.5 % — ABNORMAL HIGH (ref 4.8–5.6)

## 2022-10-23 LAB — TESTOSTERONE: Testosterone: 441 ng/dL (ref 264–916)

## 2022-10-25 DIAGNOSIS — R7303 Prediabetes: Secondary | ICD-10-CM | POA: Insufficient documentation

## 2022-10-25 DIAGNOSIS — G8929 Other chronic pain: Secondary | ICD-10-CM | POA: Insufficient documentation

## 2022-10-25 DIAGNOSIS — E782 Mixed hyperlipidemia: Secondary | ICD-10-CM | POA: Insufficient documentation

## 2022-10-25 DIAGNOSIS — E559 Vitamin D deficiency, unspecified: Secondary | ICD-10-CM | POA: Insufficient documentation

## 2022-10-25 DIAGNOSIS — R5383 Other fatigue: Secondary | ICD-10-CM | POA: Insufficient documentation

## 2022-10-25 DIAGNOSIS — E291 Testicular hypofunction: Secondary | ICD-10-CM | POA: Insufficient documentation

## 2022-10-25 DIAGNOSIS — E538 Deficiency of other specified B group vitamins: Secondary | ICD-10-CM | POA: Insufficient documentation

## 2022-10-25 HISTORY — DX: Other fatigue: R53.83

## 2022-10-25 NOTE — Assessment & Plan Note (Signed)
Checking levels in office today.  May resume injections if needed.

## 2022-10-25 NOTE — Assessment & Plan Note (Signed)
A1C checked today.  Will adjust therapy as needed.

## 2022-10-25 NOTE — Assessment & Plan Note (Signed)
Checking B12 level in office today.  B12 injection given in office today as well.

## 2022-10-25 NOTE — Assessment & Plan Note (Signed)
Sending pain medication refill.  Patient has been informed of prescribing changes regarding pain medications.  Notice signed by patient and myself at visit today.  Will send referral for pain clinic for continuation of treatment.

## 2022-10-25 NOTE — Assessment & Plan Note (Signed)
Checking labs today.  Continue current therapy for lipid control. Will modify as needed based on labwork results.  

## 2022-11-06 ENCOUNTER — Telehealth: Payer: Self-pay

## 2022-11-06 NOTE — Telephone Encounter (Signed)
Pt called and left vm regarding rx clonazepam , said his pharmacy doesn't have rx in stock & said they do have the 0.5mg  in stock, he said he didn't want to go to another pharmacy and asked if you can send 0.5mg  dose the same directions to equal the ? Please advise

## 2022-11-07 MED ORDER — CLONAZEPAM 0.5 MG PO TABS: 1.0000 mg | ORAL_TABLET | Freq: Two times a day (BID) | ORAL | 3 refills | Status: DC | PRN

## 2022-11-07 NOTE — Addendum Note (Signed)
Addended by: Grayling Congress on: 11/07/2022 11:57 AM   Modules accepted: Orders

## 2022-11-12 DIAGNOSIS — L601 Onycholysis: Secondary | ICD-10-CM | POA: Diagnosis not present

## 2022-11-12 DIAGNOSIS — L600 Ingrowing nail: Secondary | ICD-10-CM | POA: Diagnosis not present

## 2022-11-12 DIAGNOSIS — B351 Tinea unguium: Secondary | ICD-10-CM | POA: Diagnosis not present

## 2022-11-12 DIAGNOSIS — M2042 Other hammer toe(s) (acquired), left foot: Secondary | ICD-10-CM | POA: Diagnosis not present

## 2022-11-12 DIAGNOSIS — M2041 Other hammer toe(s) (acquired), right foot: Secondary | ICD-10-CM | POA: Diagnosis not present

## 2022-11-12 DIAGNOSIS — R234 Changes in skin texture: Secondary | ICD-10-CM | POA: Diagnosis not present

## 2022-11-12 DIAGNOSIS — G629 Polyneuropathy, unspecified: Secondary | ICD-10-CM | POA: Diagnosis not present

## 2022-11-19 ENCOUNTER — Ambulatory Visit: Payer: Medicare HMO

## 2022-11-19 ENCOUNTER — Other Ambulatory Visit: Payer: Self-pay

## 2022-11-19 NOTE — Progress Notes (Signed)
Follow Up Pharmacist Visit (CCM)  Clinical Summary Next Pharmacist Follow Up: 03/25/23 Next AWV: 11/22/22 Summary for PCP:  - I will work with the patient on getting a diet plan in place, encouraged patient to cut back on ice cream and coke to start. He will be researching [employer] food delivery options, so he can order grocery instead of going to the store. - He is requesting a referral to [facility] (Dr.Khan), so he can get restarted on treatment with them. - He stated that he is constantly depressed due to pain. Pain symptoms include shooting pain down his legs, tingling and burning sensation in the feet, which is consistent with nerve pain. Please assess him for Cymbalta. - CP FPO in 2 weeks regarding Diet. . Patient's Chronic Conditions: Anxiety, Depression, Hyperlipidemia/Dyslipidemia (HLD), Tobacco Use Disorder dimock, eugenia on 11/13/2022 02:20 PM CCM Billed Time: HC Chart Prep: 30 minutes (11/13/22)  . Disease Assessments Visit Date Visit Completed on: 11/19/2022 Subjective Information Subjective: back pain shooting down the legs. [facility] - [facility]. He stays home, gets around without walker at home. Everywhere else he needs walker.  Lives with wife but don't get along. He used to work in [employer] business. He once fathered a child, has no idea about the whereabouts. Lifestyle habits: Diet: BF 2 packs on peanut butter crackers, microwave lunch and dinner. He cannot walk in [employer] so goes to [employer] and gets what is available there. He eats ice cream every night sometimes 3 of them.  Tobacco 4-5 cigarettes a day Alcohol: quit 3-4 yrs ago  What is the patient's sleep pattern?: Other (with free form text), Waking up too early in the morning, Trouble going back to sleep after waking up Details: 4hrs of sleep . SDOH: Accountable Health Communities Health-Related Social Needs Screening  Tool ([email or web address]) SDOH questions were documented and reviewed (EMR or [employer]) within the past 12 months or since hospitalization?: No What is your living situation today? (ref #1): I have a steady place to live Think about the place you live. Do you have problems with any of the following? (ref #2): None of the above Within the past 12 months, you worried that your food would run out before you got money to buy more (ref #3): Never true Within the past 12 months, the food you bought just didn't last and you didn't have money to get more (ref #4): Never true In the past 12 months, has lack of reliable transportation kept you from medical appointments, meetings, work or from getting things needed for daily living? (ref #5): No In the past 12 months, has the electric, gas, oil, or water company threatened to shut off services in your home? (ref #6): No How often does anyone, including family and friends, physically hurt you? (ref #7): Never (1) How often does anyone, including family and friends, insult or talk down to you? (ref #8): Fairly often (4) How often does anyone, including friends and family, threaten you with harm? (ref #9): Never (1) How often does anyone, including family and friends, scream or curse at you? (ref #10): Sometimes (3) Medication Adherence Does the [facility] have access to medication refill history?: Yes Medication adherence rates for the STAR metric medications: None Medication adherence rates for non-STAR metric medications: NA Is Patient using [facility] pharmacy?: No Name and location of [facility] Current Rx insurance plan: [employer] Are meds synced by current pharmacy?: No Are meds delivered by current pharmacy?: No -  delivery not available Assessment:: Adherent . Hyperlipidemia/Dyslipidemia (HLD) Last Lipid panel on: 10/22/2022 TC (Goal<200): 238 LDL: 149 HDL (Goal>40): 38 TG  (Goal<150): 278 ASCVD 10-year risk?is:: High (>20%) ASCVD Risk Score: 26.3% Assessed today?: Yes LDL Goal: <70 We discussed: How a diet high in fruits/vegetables/nuts/whole grains/beans may help to reduce your cholesterol. Increasing soluble fiber intake.  Avoiding sugary foods and trans fat, limiting carbohydrates, and reducing portion sizes. Recommended increasing intake of healthy fats into their diet Assessment:: Uncontrolled Drug: None Plan/Follow up: S: Patient is concerned about increase in Lipid panel at recent blood work. His LDL and TG have both increased significantly. He admitted to eating 3 ice creams every night and drinking 2 coca colas a day. M: He will work on improving diet to bring his TG levels down. He will start with cutting back ice cream to 1 a day and drinking 1/2 coca cola a day. He is motivated to cut back on oatmeal cookies also, which I encouraged him to try. A: TG are directly dependent on food and drink choices. He was drinking mainly water and recently started on coke. He understands the co relation between food/drink and TG. R: Patient understands the importance of proper food choices to bring his lipid panel down. T: He will work on these two action items for the next 2 weeks. I will call him to F/U and work on next steps. He will also [facility] for grocery delivery, since he cannot walk around in the store much. Start fish oil  . Anxiety Most Recent GAD-7 Score: Not in EMR Assessed today?: No Drug: Clonazepam 0.5mg , 2 tablets BID prn anxiety Depression Most recent PHQ-9 Score: Not in EMR Assessed today?: No Drug: None Tobacco Use Disorder Assessed today?: No Drug: None Preventative Health Care Gap: Colorectal cancer screening: Needs to be addressed Care Gap: Breast cancer screening: Patient excluded from population (Age > 75, hx of bilateral mastectomy, frailty, hospice services) Care Gap: Annual Wellness Visit (AWV):  Addressed . Engagement Notes Lynann Bologna on 11/19/2022 03:30 PM BP 124/82 Pharmacist Interventions Intervention Details Pharmacist Interventions discussed: Yes Education: Lifestyle modifications . Lynann Bologna, PharmD  Chart review  Office visit Documentation 

## 2022-11-20 DIAGNOSIS — I10 Essential (primary) hypertension: Secondary | ICD-10-CM | POA: Diagnosis not present

## 2022-11-20 DIAGNOSIS — M48061 Spinal stenosis, lumbar region without neurogenic claudication: Secondary | ICD-10-CM | POA: Diagnosis not present

## 2022-11-20 DIAGNOSIS — M1990 Unspecified osteoarthritis, unspecified site: Secondary | ICD-10-CM | POA: Diagnosis not present

## 2022-11-20 MED ORDER — GABAPENTIN 400 MG PO CAPS: 400.0000 mg | ORAL_CAPSULE | Freq: Three times a day (TID) | ORAL | 0 refills | Status: DC

## 2022-11-20 MED ORDER — CLONAZEPAM 0.5 MG PO TABS: 1.0000 mg | ORAL_TABLET | Freq: Two times a day (BID) | ORAL | 3 refills | Status: DC | PRN

## 2022-11-22 ENCOUNTER — Encounter: Payer: Self-pay | Admitting: Family

## 2022-11-22 ENCOUNTER — Ambulatory Visit (INDEPENDENT_AMBULATORY_CARE_PROVIDER_SITE_OTHER): Payer: Medicare HMO | Admitting: Family

## 2022-11-22 ENCOUNTER — Other Ambulatory Visit: Payer: Self-pay | Admitting: Family

## 2022-11-22 VITALS — BP 132/70 | HR 110 | Ht 72.0 in | Wt 242.0 lb

## 2022-11-22 DIAGNOSIS — M5441 Lumbago with sciatica, right side: Secondary | ICD-10-CM | POA: Diagnosis not present

## 2022-11-22 DIAGNOSIS — E782 Mixed hyperlipidemia: Secondary | ICD-10-CM | POA: Diagnosis not present

## 2022-11-22 DIAGNOSIS — M5442 Lumbago with sciatica, left side: Secondary | ICD-10-CM | POA: Diagnosis not present

## 2022-11-22 DIAGNOSIS — G8929 Other chronic pain: Secondary | ICD-10-CM | POA: Diagnosis not present

## 2022-11-22 DIAGNOSIS — M48061 Spinal stenosis, lumbar region without neurogenic claudication: Secondary | ICD-10-CM

## 2022-11-22 DIAGNOSIS — J449 Chronic obstructive pulmonary disease, unspecified: Secondary | ICD-10-CM

## 2022-11-22 DIAGNOSIS — S22080G Wedge compression fracture of T11-T12 vertebra, subsequent encounter for fracture with delayed healing: Secondary | ICD-10-CM

## 2022-11-22 MED ORDER — OXYCODONE HCL 5 MG PO TABS: 5.0000 mg | ORAL_TABLET | Freq: Three times a day (TID) | ORAL | 0 refills | Status: DC | PRN

## 2022-11-22 MED ORDER — GABAPENTIN 400 MG PO CAPS: 400.0000 mg | ORAL_CAPSULE | Freq: Three times a day (TID) | ORAL | 0 refills | Status: DC

## 2022-11-22 NOTE — Progress Notes (Signed)
Established Patient Office Visit  Subjective:  Patient ID: Lance House, male    DOB: [birth year]  Age: 72 y.o. MRN: [id number]  Chief Complaint  Patient presents with   Follow-up    1 month follow up    Patient is here for his 1 month follow up.  The pain clinic that we referred him to is not taking on new medication management patients at this time.  He asks if it might be possible to go back to Dr. Ronita Hipps.   Needs refills, I have informed him that I can only bridge the prescriptions until he is seen at a pain clinic.   Is also now willing to discuss surgery on his back if needed, as he is now in enough pain that he is no longer able to tolerate it.  Asks if we can get him a referral to a spine dr.   No other concerns at this time.   Past Medical History:  Diagnosis Date   Anxiety    Closed compression fracture of L4 vertebra (HCC) 08/18/2021   Closed fracture of distal fibula 02/13/2021   Depression    Gout    ICH (intracerebral hemorrhage) (HCC) 08/15/2021   Injury of right ankle 02/13/2021    Past Surgical History:  Procedure Laterality Date   APPENDECTOMY  1994   INGUINAL HERNIA REPAIR Left 04/13/2019   Procedure: HERNIA REPAIR INGUINAL ADULT OPEN, LEFT;  Surgeon: Duanne Guess, MD;  Location: [facility] ORS;  Service: General;  Laterality: Left;   NASAL SINUS SURGERY  2015   [facility]   VIDEO ASSISTED THORACOSCOPY (VATS)/DECORTICATION Left 09/11/2021   Procedure: VIDEO ASSISTED THORACOSCOPY (VATS)/DECORTICATION;  Surgeon: Loreli Slot, MD;  Location[facility];  Service: Thoracic;  Laterality: Left;    Social History   Socioeconomic History   Marital status: Married    Spouse name: Not on file   Number of children: Not on file   Years of education: Not on file   Highest education level: Not on file  Occupational History   Not on file  Tobacco Use   Smoking status: Every Day    Packs/day: 1    Types: Cigarettes   Smokeless tobacco: Never  Vaping Use    Vaping Use: Never used  Substance and Sexual Activity   Alcohol use: Not Currently    Alcohol/week: 42.0 standard drinks of alcohol    Types: 42 Cans of beer per week    Comment: quit around 05/2021   Drug use: Not Currently   Sexual activity: Not on file  Other Topics Concern   Not on file  Social History Narrative   Not on file   Social Determinants of Health   Financial Resource Strain: Not on file  Food Insecurity: Not on file  Transportation Needs: Not on file  Physical Activity: Not on file  Stress: Not on file  Social Connections: Not on file  Intimate Partner Violence: Not on file    Family History  Problem Relation Age of Onset   Skin cancer Father    Multiple sclerosis Sister    Non-Hodgkin's lymphoma Brother     Allergies  Allergen Reactions   Ciprocin-Fluocin-Procin [Fluocinolone] Anaphylaxis    Hip hurt   Ciprofloxacin Other (See Comments)    Unknown reaction   Colchicine Other (See Comments)   Ezetimibe Nausea And Vomiting   Meloxicam Diarrhea and Swelling   Duloxetine Diarrhea, Nausea And Vomiting and Other (See Comments)    Altered mental status  Review of Systems  Musculoskeletal:  Positive for back pain.  All other systems reviewed and are negative.      Objective:   BP 132/70   Pulse (!) 110   Ht 6' (1.829 m)   Wt 242 lb (109.8 kg)   SpO2 94%   BMI 32.82 kg/m   Vitals:   11/22/22 1056  BP: 132/70  Pulse: (!) 110  Height: 6' (1.829 m)  Weight: 242 lb (109.8 kg)  SpO2: 94%  BMI (Calculated): 32.81    Physical Exam Vitals and nursing note reviewed.  Constitutional:      Appearance: Normal appearance. He is normal weight.  Eyes:     Extraocular Movements: Extraocular movements intact.     Conjunctiva/sclera: Conjunctivae normal.     Pupils: Pupils are equal, round, and reactive to light.  Cardiovascular:     Rate and Rhythm: Normal rate and regular rhythm.     Pulses: Normal pulses.  Pulmonary:     Effort:  Pulmonary effort is normal.     Breath sounds: Normal breath sounds.  Musculoskeletal:     Thoracic back: Spasms and tenderness present.     Lumbar back: Spasms and tenderness present. Positive right straight leg raise test and positive left straight leg raise test.  Neurological:     Mental Status: He is alert and oriented to person, place, and time.     Motor: Weakness present.     Gait: Gait abnormal.  Psychiatric:        Mood and Affect: Mood normal.        Behavior: Behavior normal.        Thought Content: Thought content normal.        Judgment: Judgment normal.      No results found for any visits on 11/22/22.  Recent Results (from the past 2160 hour(s))  Lipid panel     Status: Abnormal   Collection Time: 10/22/22 11:43 AM  Result Value Ref Range   Cholesterol, Total 238 (H) 100 - 199 mg/dL   Triglycerides 161 (H) 0 - 149 mg/dL   HDL 38 (L) >09 mg/dL   VLDL Cholesterol Cal 51 (H) 5 - 40 mg/dL   LDL Chol Calc (NIH) 604 (H) 0 - 99 mg/dL   Chol/HDL Ratio 6.3 (H) 0.0 - 5.0 ratio    Comment:                                   T. Chol/HDL Ratio                                             Men  Women                               1/2 Avg.Risk  3.4    3.3                                   Avg.Risk  5.0    4.4                                2X Avg.Risk  9.6  7.1                                3X Avg.Risk 23.4   11.0   VITAMIN D 25 Hydroxy (Vit-D Deficiency, Fractures)     Status: None   Collection Time: 10/22/22 11:43 AM  Result Value Ref Range   Vit D, 25-Hydroxy 62.2 30.0 - 100.0 ng/mL    Comment: Vitamin D deficiency has been defined by the Institute of Medicine and an Endocrine Society practice guideline as a level of serum 25-OH vitamin D less than 20 ng/mL (1,2). The Endocrine Society went on to further define vitamin D insufficiency as a level between 21 and 29 ng/mL (2). 1. IOM (Institute of Medicine). 2010. Dietary reference    intakes for calcium and D.  [place]: The    [employer]. 2. Holick MF, Binkley Marston, Bischoff-Ferrari HA, et al.    Evaluation, treatment, and prevention of vitamin D    deficiency: an Endocrine Society clinical practice    guideline. JCEM. 2011 Jul; 96(7):1911-30.   CBC With Differential     Status: Abnormal   Collection Time: 10/22/22 11:43 AM  Result Value Ref Range   WBC 7.6 3.4 - 10.8 x10E3/uL   RBC 6.00 (H) 4.14 - 5.80 x10E6/uL   Hemoglobin 17.9 (H) 13.0 - 17.7 g/dL   Hematocrit 81.1 (H) 91.4 - 51.0 %   MCV 88 79 - 97 fL   MCH 29.8 26.6 - 33.0 pg   MCHC 33.8 31.5 - 35.7 g/dL   RDW 78.2 95.6 - 21.3 %   Neutrophils 60 Not Estab. %   Lymphs 26 Not Estab. %   Monocytes 7 Not Estab. %   Eos 6 Not Estab. %   Basos 1 Not Estab. %   Neutrophils Absolute 4.5 1.4 - 7.0 x10E3/uL   Lymphocytes Absolute 2.0 0.7 - 3.1 x10E3/uL   Monocytes Absolute 0.6 0.1 - 0.9 x10E3/uL   EOS (ABSOLUTE) 0.5 (H) 0.0 - 0.4 x10E3/uL   Basophils Absolute 0.1 0.0 - 0.2 x10E3/uL   Immature Granulocytes 0 Not Estab. %   Immature Grans (Abs) 0.0 0.0 - 0.1 x10E3/uL  CMP14+EGFR     Status: Abnormal   Collection Time: 10/22/22 11:43 AM  Result Value Ref Range   Glucose 103 (H) 70 - 99 mg/dL   BUN 5 (L) 8 - 27 mg/dL   Creatinine, Ser 0.86 (L) 0.76 - 1.27 mg/dL   eGFR 98 >57 QI/ONG/2.95   BUN/Creatinine Ratio 7 (L) 10 - 24   Sodium 135 134 - 144 mmol/L   Potassium 4.3 3.5 - 5.2 mmol/L   Chloride 98 96 - 106 mmol/L   CO2 22 20 - 29 mmol/L   Calcium 9.3 8.6 - 10.2 mg/dL   Total Protein 6.6 6.0 - 8.5 g/dL   Albumin 4.3 3.9 - 4.9 g/dL   Globulin, Total 2.3 1.5 - 4.5 g/dL   Albumin/Globulin Ratio 1.9 1.2 - 2.2   Bilirubin Total 0.8 0.0 - 1.2 mg/dL   Alkaline Phosphatase 83 44 - 121 IU/L   AST 14 0 - 40 IU/L   ALT 10 0 - 44 IU/L  Hemoglobin A1c     Status: Abnormal   Collection Time: 10/22/22 11:43 AM  Result Value Ref Range   Hgb A1c MFr Bld 6.5 (H) 4.8 - 5.6 %    Comment:          Prediabetes: 5.7 -  6.4           Diabetes: >6.4          Glycemic control for adults with diabetes: <7.0    Est. average glucose Bld gHb Est-mCnc 140 mg/dL  Vitamin Z61     Status: None   Collection Time: 10/22/22 11:43 AM  Result Value Ref Range   Vitamin B-12 357 232 - 1,245 pg/mL  Testosterone, Total     Status: None   Collection Time: 10/22/22 11:43 AM  Result Value Ref Range   Testosterone 441 264 - 916 ng/dL    Comment: Adult male reference interval is based on a population of healthy nonobese males (BMI <30) between 34 and 58 years old. Travison, et.al. JCEM [phone]. PMID: [id number].        Assessment & Plan:   Problem List Items Addressed This Visit       Respiratory   Chronic obstructive pulmonary disease (HCC)    Patient states that he started to smoke again.  He is aware that he needs to stop this, as he had previously.  Says that he could use the money from the cigarettes to pay for meds he needs.   Continue current meds Will discuss inhaler at next appointment.         Nervous and Auditory   Chronic bilateral low back pain with bilateral sciatica - Primary    Patient is being referred to neurosurgery.  Will see what they say regarding his pain and if there is anything else we can do.  Sending refills for his medications until we can get him in with a new pain provider.      Relevant Medications   oxyCODONE (OXY IR/ROXICODONE) 5 MG immediate release tablet   gabapentin (NEURONTIN) 400 MG capsule     Musculoskeletal and Integument   Compression fracture of thoracic vertebra [facility])    Patient is being referred to neurosurgery.  Will see what they say regarding his pain and if there is anything else we can do.  Sending refills for his medications until we can get him in with a new pain provider.        Other   Mixed hyperlipidemia   Spinal stenosis at L4-L5 level    Patient is being referred to neurosurgery.  Will see what they say regarding his pain and if there is  anything else we can do.  Sending refills for his medications until we can get him in with a new pain provider.       Return in about 1 month (around 12/23/2022) for F/U.   Total time spent: 20 minutes  Miki Kins, FNP  11/22/2022

## 2022-11-22 NOTE — Assessment & Plan Note (Signed)
Patient is being referred to neurosurgery.  Will see what they say regarding his pain and if there is anything else we can do.  Sending refills for his medications until we can get him in with a new pain provider. 

## 2022-11-22 NOTE — Assessment & Plan Note (Addendum)
Patient is being referred to neurosurgery.  Will see what they say regarding his pain and if there is anything else we can do.  Sending refills for his medications until we can get him in with a new pain provider.

## 2022-11-22 NOTE — Assessment & Plan Note (Addendum)
Patient is being referred to neurosurgery.  Will see what they say regarding his pain and if there is anything else we can do.  Sending refills for his medications until we can get him in with a new pain provider. 

## 2022-11-22 NOTE — Assessment & Plan Note (Signed)
Patient states that he started to smoke again.  He is aware that he needs to stop this, as he had previously.  Says that he could use the money from the cigarettes to pay for meds he needs.   Continue current meds Will discuss inhaler at next appointment.

## 2022-12-07 ENCOUNTER — Telehealth: Payer: Self-pay

## 2022-12-07 NOTE — Telephone Encounter (Signed)
Focused Pharmacist Outreach  Connelley,Millard  71 years, Male  DOB: 09/17/1950  M: [phone]  __________________________________________________ Outreach Details Details of the Visit: Diet: He is reading a lot online. He bought some oatmeal and soy meal. He cut back from 3 coke to 1. He is eating some yogurt. Cutting back on ice cream has been harder. Suggested frozen yogurt. He is being referred to pain management. Smoking : he started smoking and wants to cut back down like last time. He smokes 1/2 cigarette and save the other half. Date of next Pharmacist Follow-up: 01/29/2023 Documentation loaded into clinic EMR: Done Care Plan update needed?: N/A . Lynann Bologna, PharmD  Televisit and documentation 

## 2022-12-15 ENCOUNTER — Other Ambulatory Visit: Payer: Self-pay | Admitting: Family

## 2022-12-18 ENCOUNTER — Telehealth: Payer: Self-pay | Admitting: Family

## 2022-12-18 NOTE — Telephone Encounter (Signed)
Patient called in needing Rx sent for his pain meds to [facility] [address]

## 2022-12-20 ENCOUNTER — Other Ambulatory Visit: Payer: Self-pay | Admitting: Family

## 2022-12-21 MED ORDER — OXYCODONE HCL 5 MG PO TABS: 5.0000 mg | ORAL_TABLET | Freq: Three times a day (TID) | ORAL | 0 refills | Status: DC | PRN

## 2022-12-24 ENCOUNTER — Ambulatory Visit (INDEPENDENT_AMBULATORY_CARE_PROVIDER_SITE_OTHER): Payer: Medicare HMO | Admitting: Family

## 2022-12-24 ENCOUNTER — Encounter: Payer: Self-pay | Admitting: Family

## 2022-12-24 VITALS — BP 124/72 | HR 101 | Ht 72.0 in | Wt 245.0 lb

## 2022-12-24 DIAGNOSIS — E538 Deficiency of other specified B group vitamins: Secondary | ICD-10-CM | POA: Diagnosis not present

## 2022-12-24 DIAGNOSIS — M5442 Lumbago with sciatica, left side: Secondary | ICD-10-CM | POA: Diagnosis not present

## 2022-12-24 DIAGNOSIS — S22080G Wedge compression fracture of T11-T12 vertebra, subsequent encounter for fracture with delayed healing: Secondary | ICD-10-CM

## 2022-12-24 DIAGNOSIS — M48061 Spinal stenosis, lumbar region without neurogenic claudication: Secondary | ICD-10-CM

## 2022-12-24 DIAGNOSIS — G8929 Other chronic pain: Secondary | ICD-10-CM

## 2022-12-24 DIAGNOSIS — E782 Mixed hyperlipidemia: Secondary | ICD-10-CM

## 2022-12-24 DIAGNOSIS — E559 Vitamin D deficiency, unspecified: Secondary | ICD-10-CM

## 2022-12-24 DIAGNOSIS — J438 Other emphysema: Secondary | ICD-10-CM | POA: Diagnosis not present

## 2022-12-24 DIAGNOSIS — M5441 Lumbago with sciatica, right side: Secondary | ICD-10-CM | POA: Diagnosis not present

## 2022-12-24 DIAGNOSIS — R7303 Prediabetes: Secondary | ICD-10-CM | POA: Diagnosis not present

## 2022-12-24 DIAGNOSIS — F419 Anxiety disorder, unspecified: Secondary | ICD-10-CM

## 2022-12-24 MED ORDER — CLONAZEPAM 1 MG PO TABS
1.0000 mg | ORAL_TABLET | Freq: Two times a day (BID) | ORAL | 2 refills | Status: DC
Start: 2022-12-24 — End: 2023-02-26

## 2022-12-24 MED ORDER — CYANOCOBALAMIN 1000 MCG/ML IJ SOLN
1000.0000 ug | Freq: Once | INTRAMUSCULAR | Status: AC
Start: 2022-12-24 — End: 2022-12-24
  Administered 2022-12-24: 1000 ug via INTRAMUSCULAR

## 2022-12-24 NOTE — Progress Notes (Signed)
Established Patient Office Visit  Subjective:  Patient ID: Lance House, male    DOB: Mar 19, 1951  Age: 72 y.o. MRN: [id number]  Chief Complaint  Patient presents with   Follow-up    1 month follow up    Patient is here for his 1 month follow up.   Needs referral to Dr. Welton Flakes for pain management.  Dr. Welton Flakes has let me know that he is willing to look at seeing patient again.   He is doing well otherwise.  No other concerns at this time.   Past Medical History:  Diagnosis Date   Abdominal pain 04/08/2019   Anxiety    Anxiety hyperventilation 05/08/2019   Closed compression fracture of L4 vertebra (HCC) 08/18/2021   Closed fracture of distal fibula 02/13/2021   Compression fracture of thoracic vertebra (HCC) 08/18/2021   Depression    Elevated troponin    Gout    Hypokalemia 08/16/2021   Hyponatremia 08/16/2021   ICH (intracerebral hemorrhage) (HCC) 08/15/2021   Incarcerated left inguinal hernia    Injury of right ankle 02/13/2021   Left flank pain 02/03/2013   Other fatigue 10/25/2022   Pain    Pressure injury of skin 09/11/2021   Rhabdomyolysis 08/16/2021    Past Surgical History:  Procedure Laterality Date   APPENDECTOMY  1994   INGUINAL HERNIA REPAIR Left 04/13/2019   Procedure: HERNIA REPAIR INGUINAL ADULT OPEN, LEFT;  Surgeon: Duanne Guess, MD;  Location: [facility] ORS;  Service: General;  Laterality: Left;   NASAL SINUS SURGERY  2015   [facility]   VIDEO ASSISTED THORACOSCOPY (VATS)/DECORTICATION Left 09/11/2021   Procedure: VIDEO ASSISTED THORACOSCOPY (VATS)/DECORTICATION;  Surgeon: Loreli Slot, MD;  Location: [facility];  Service: Thoracic;  Laterality: Left;    Social History   Socioeconomic History   Marital status: Married    Spouse name: Not on file   Number of children: Not on file   Years of education: Not on file   Highest education level: Not on file  Occupational History   Not on file  Tobacco Use   Smoking status: Every Day    Packs/day: 1     Types: Cigarettes   Smokeless tobacco: Never  Vaping Use   Vaping Use: Never used  Substance and Sexual Activity   Alcohol use: Not Currently    Alcohol/week: 42.0 standard drinks of alcohol    Types: 42 Cans of beer per week    Comment: quit around 05/2021   Drug use: Not Currently   Sexual activity: Not on file  Other Topics Concern   Not on file  Social History Narrative   Not on file   Social Determinants of Health   Financial Resource Strain: Not on file  Food Insecurity: Not on file  Transportation Needs: Not on file  Physical Activity: Not on file  Stress: Not on file  Social Connections: Not on file  Intimate Partner Violence: Not on file    Family History  Problem Relation Age of Onset   Skin cancer Father    Multiple sclerosis Sister    Non-Hodgkin's lymphoma Brother     Allergies  Allergen Reactions   Ciprocin-Fluocin-Procin [Fluocinolone] Anaphylaxis    Hip hurt   Ciprofloxacin Other (See Comments)    Unknown reaction   Colchicine Other (See Comments)   Ezetimibe Nausea And Vomiting   Meloxicam Diarrhea and Swelling   Duloxetine Diarrhea, Nausea And Vomiting and Other (See Comments)    Altered mental status    Review  of Systems  Musculoskeletal:  Positive for back pain and joint pain.  All other systems reviewed and are negative.      Objective:   BP 124/72   Pulse (!) 101   Ht 6' (1.829 m)   Wt 245 lb (111.1 kg)   SpO2 93%   BMI 33.23 kg/m   Vitals:   12/24/22 1055  BP: 124/72  Pulse: (!) 101  Height: 6' (1.829 m)  Weight: 245 lb (111.1 kg)  SpO2: 93%  BMI (Calculated): 33.22    Physical Exam Musculoskeletal:     Lumbar back: Tenderness present. Positive right straight leg raise test and positive left straight leg raise test.  Neurological:     Mental Status: He is alert and oriented to person, place, and time. Mental status is at baseline.     Gait: Gait abnormal (shuffling).      No results found for any visits on  12/24/22.  Recent Results (from the past 2160 hour(s))  Lipid panel     Status: Abnormal   Collection Time: 10/22/22 11:43 AM  Result Value Ref Range   Cholesterol, Total 238 (H) 100 - 199 mg/dL   Triglycerides 161 (H) 0 - 149 mg/dL   HDL 38 (L) >09 mg/dL   VLDL Cholesterol Cal 51 (H) 5 - 40 mg/dL   LDL Chol Calc (NIH) 604 (H) 0 - 99 mg/dL   Chol/HDL Ratio 6.3 (H) 0.0 - 5.0 ratio    Comment:                                   T. Chol/HDL Ratio                                             Men  Women                               1/2 Avg.Risk  3.4    3.3                                   Avg.Risk  5.0    4.4                                2X Avg.Risk  9.6    7.1                                3X Avg.Risk 23.4   11.0   VITAMIN D 25 Hydroxy (Vit-D Deficiency, Fractures)     Status: None   Collection Time: 10/22/22 11:43 AM  Result Value Ref Range   Vit D, 25-Hydroxy 62.2 30.0 - 100.0 ng/mL    Comment: Vitamin D deficiency has been defined by the Institute of Medicine and an Endocrine Society practice guideline as a level of serum 25-OH vitamin D less than 20 ng/mL (1,2). The Endocrine Society went on to further define vitamin D insufficiency as a level between 21 and 29 ng/mL (2). 1. IOM (Institute of Medicine). 2010. Dietary reference    intakes for calcium and D. [place]  [place]: The    [employer]. 2. Holick MF, Binkley [place], Bischoff-Ferrari HA, et al.    Evaluation, treatment, and prevention of vitamin D    deficiency: an Endocrine Society clinical practice    guideline. JCEM. 2011 Jul; 96(7):1911-30.   CBC With Differential     Status: Abnormal   Collection Time: 10/22/22 11:43 AM  Result Value Ref Range   WBC 7.6 3.4 - 10.8 x10E3/uL   RBC 6.00 (H) 4.14 - 5.80 x10E6/uL   Hemoglobin 17.9 (H) 13.0 - 17.7 g/dL   Hematocrit 16.1 (H) 09.6 - 51.0 %   MCV 88 79 - 97 fL   MCH 29.8 26.6 - 33.0 pg   MCHC 33.8 31.5 - 35.7 g/dL   RDW 04.5 40.9 - 81.1 %   Neutrophils 60 Not  Estab. %   Lymphs 26 Not Estab. %   Monocytes 7 Not Estab. %   Eos 6 Not Estab. %   Basos 1 Not Estab. %   Neutrophils Absolute 4.5 1.4 - 7.0 x10E3/uL   Lymphocytes Absolute 2.0 0.7 - 3.1 x10E3/uL   Monocytes Absolute 0.6 0.1 - 0.9 x10E3/uL   EOS (ABSOLUTE) 0.5 (H) 0.0 - 0.4 x10E3/uL   Basophils Absolute 0.1 0.0 - 0.2 x10E3/uL   Immature Granulocytes 0 Not Estab. %   Immature Grans (Abs) 0.0 0.0 - 0.1 x10E3/uL  CMP14+EGFR     Status: Abnormal   Collection Time: 10/22/22 11:43 AM  Result Value Ref Range   Glucose 103 (H) 70 - 99 mg/dL   BUN 5 (L) 8 - 27 mg/dL   Creatinine, Ser 9.14 (L) 0.76 - 1.27 mg/dL   eGFR 98 >78 GN/FAO/1.30   BUN/Creatinine Ratio 7 (L) 10 - 24   Sodium 135 134 - 144 mmol/L   Potassium 4.3 3.5 - 5.2 mmol/L   Chloride 98 96 - 106 mmol/L   CO2 22 20 - 29 mmol/L   Calcium 9.3 8.6 - 10.2 mg/dL   Total Protein 6.6 6.0 - 8.5 g/dL   Albumin 4.3 3.9 - 4.9 g/dL   Globulin, Total 2.3 1.5 - 4.5 g/dL   Albumin/Globulin Ratio 1.9 1.2 - 2.2   Bilirubin Total 0.8 0.0 - 1.2 mg/dL   Alkaline Phosphatase 83 44 - 121 IU/L   AST 14 0 - 40 IU/L   ALT 10 0 - 44 IU/L  Hemoglobin A1c     Status: Abnormal   Collection Time: 10/22/22 11:43 AM  Result Value Ref Range   Hgb A1c MFr Bld 6.5 (H) 4.8 - 5.6 %    Comment:          Prediabetes: 5.7 - 6.4          Diabetes: >6.4          Glycemic control for adults with diabetes: <7.0    Est. average glucose Bld gHb Est-mCnc 140 mg/dL  Vitamin Q65     Status: None   Collection Time: 10/22/22 11:43 AM  Result Value Ref Range   Vitamin B-12 357 232 - 1,245 pg/mL  Testosterone, Total     Status: None   Collection Time: 10/22/22 11:43 AM  Result Value Ref Range   Testosterone 441 264 - 916 ng/dL    Comment: Adult male reference interval is based on a population of healthy nonobese males (BMI <30) between 20 and 75 years old. [place], et.al. JCEM [phone]. PMID: 44010272.        Assessment & Plan:   Problem List  Items Addressed  This Visit       Active Problems   Anxiety   Relevant Medications   clonazePAM (KLONOPIN) 1 MG tablet   Chronic obstructive pulmonary disease (HCC) - Primary    Patient stable.  Well controlled with current therapy.   Continue current meds.        Mixed hyperlipidemia    Continue current therapy for lipid control. Will modify as needed based on labwork results.        Vitamin D deficiency, unspecified    Checking labs today.  Will continue supplements as needed.        Prediabetes    Patient educated on foods that contain carbohydrates and the need to decrease intake.  We discussed prediabetes, and what it means and the need for strict dietary control to prevent progression to type 2 diabetes.  Advised to decrease intake of sugary drinks, including sodas, sweet tea, and some juices, and of starch and sugar heavy foods (ie., potatoes, rice, bread, pasta, desserts). He verbalizes understanding and agreement with the changes discussed today.       B12 deficiency due to diet    B12 injection given in office today.  Repeat in 1 month.       Chronic bilateral low back pain with bilateral sciatica   Relevant Medications   clonazePAM (KLONOPIN) 1 MG tablet   Other Relevant Orders   Ambulatory referral to Pain Clinic   Spinal stenosis at L4-L5 level   Relevant Orders   Ambulatory referral to Pain Clinic    Return in about 1 month (around 01/23/2023) for F/U.   Total time spent: 30 minutes  Miki Kins, FNP  12/24/2022   This document may have been prepared by [facility] Voice Recognition software and as such may include unintentional dictation errors.

## 2022-12-30 ENCOUNTER — Encounter: Payer: Self-pay | Admitting: Family

## 2022-12-30 NOTE — Assessment & Plan Note (Signed)
Patient stable.  Well controlled with current therapy.   Continue current meds.  

## 2022-12-30 NOTE — Assessment & Plan Note (Addendum)
Continue current therapy for lipid control. Will modify as needed based on labwork results.   

## 2022-12-30 NOTE — Assessment & Plan Note (Signed)
Checking labs today.  Will continue supplements as needed.  

## 2022-12-30 NOTE — Assessment & Plan Note (Signed)
B12 injection given in office today.  Repeat in 1 month.

## 2022-12-30 NOTE — Assessment & Plan Note (Signed)
Patient educated on foods that contain carbohydrates and the need to decrease intake.  We discussed prediabetes, and what it means and the need for strict dietary control to prevent progression to type 2 diabetes.  Advised to decrease intake of sugary drinks, including sodas, sweet tea, and some juices, and of starch and sugar heavy foods (ie., potatoes, rice, bread, pasta, desserts). He verbalizes understanding and agreement with the changes discussed today.   

## 2023-01-10 ENCOUNTER — Other Ambulatory Visit: Payer: Self-pay | Admitting: Family

## 2023-01-15 ENCOUNTER — Other Ambulatory Visit: Payer: Self-pay | Admitting: Family

## 2023-01-16 MED ORDER — OXYCODONE HCL 5 MG PO TABS: 5.0000 mg | ORAL_TABLET | Freq: Three times a day (TID) | ORAL | 0 refills | Status: DC | PRN

## 2023-01-23 ENCOUNTER — Ambulatory Visit (INDEPENDENT_AMBULATORY_CARE_PROVIDER_SITE_OTHER): Payer: Medicare HMO | Admitting: Family

## 2023-01-23 ENCOUNTER — Encounter: Payer: Self-pay | Admitting: Family

## 2023-01-23 VITALS — BP 100/85 | HR 110 | Ht 72.0 in | Wt 255.2 lb

## 2023-01-23 DIAGNOSIS — F419 Anxiety disorder, unspecified: Secondary | ICD-10-CM | POA: Diagnosis not present

## 2023-01-23 DIAGNOSIS — E559 Vitamin D deficiency, unspecified: Secondary | ICD-10-CM

## 2023-01-23 DIAGNOSIS — E538 Deficiency of other specified B group vitamins: Secondary | ICD-10-CM | POA: Diagnosis not present

## 2023-01-23 DIAGNOSIS — D485 Neoplasm of uncertain behavior of skin: Secondary | ICD-10-CM | POA: Diagnosis not present

## 2023-01-23 DIAGNOSIS — J449 Chronic obstructive pulmonary disease, unspecified: Secondary | ICD-10-CM

## 2023-01-23 MED ORDER — CYANOCOBALAMIN 1000 MCG/ML IJ SOLN
1000.0000 ug | Freq: Once | INTRAMUSCULAR | Status: AC
Start: 2023-01-23 — End: 2023-01-23
  Administered 2023-01-23: 1000 ug via INTRAMUSCULAR

## 2023-01-26 ENCOUNTER — Encounter: Payer: Self-pay | Admitting: Family

## 2023-01-26 DIAGNOSIS — D485 Neoplasm of uncertain behavior of skin: Secondary | ICD-10-CM | POA: Insufficient documentation

## 2023-01-26 NOTE — Assessment & Plan Note (Signed)
Will send refills for meds.  I would like to wean this, given the pain medications he is on.   We will discuss plan at follow up visit for this.

## 2023-01-26 NOTE — Assessment & Plan Note (Signed)
Patient stable.  Well controlled with current therapy.   Continue current meds.  

## 2023-01-26 NOTE — Assessment & Plan Note (Signed)
Will send a referral for dermatology for his hand.  I believe this is a wart, but I do not have the equipment in office to remove it.

## 2023-01-26 NOTE — Progress Notes (Signed)
Established Patient Office Visit  Subjective:  Patient ID: Lance House, male    DOB: June 26, 1951  Age: 72 y.o. MRN: [id number]  Chief Complaint  Patient presents with   Follow-up    1 mo F/U    Patient is here today for his 1 month follow up.  He has been feeling well since last appointment.   He does have additional concerns to discuss today.  He has a "growth" on his hand that is concerning to him.  He has had it for a long time, and says that he used to cut it off himself, but that it always comes back.  He asks if we can refer him to Dermatology so that he may have it removed.  Labs are not due today. Is due for a B12 injection He needs refills.   I have reviewed his active problem list, medication list, allergies, notes from last encounter, lab results for his appointment today.    No other concerns at this time.   Past Medical History:  Diagnosis Date   Abdominal pain 04/08/2019   Anxiety    Anxiety hyperventilation 05/08/2019   Closed compression fracture of L4 vertebra (HCC) 08/18/2021   Closed fracture of distal fibula 02/13/2021   Compression fracture of thoracic vertebra (HCC) 08/18/2021   Depression    Elevated troponin    Gout    Hypokalemia 08/16/2021   Hyponatremia 08/16/2021   ICH (intracerebral hemorrhage) (HCC) 08/15/2021   Incarcerated left inguinal hernia    Injury of right ankle 02/13/2021   Left flank pain 02/03/2013   Other fatigue 10/25/2022   Pain    Pressure injury of skin 09/11/2021   Rhabdomyolysis 08/16/2021    Past Surgical History:  Procedure Laterality Date   APPENDECTOMY  1994   INGUINAL HERNIA REPAIR Left 04/13/2019   Procedure: HERNIA REPAIR INGUINAL ADULT OPEN, LEFT;  Surgeon: Duanne Guess, MD;  Location: [facility] ORS;  Service: General;  Laterality: Left;   [facility]GERY  [facility]   [facility]   VIDEO ASSISTED THORACOSCOPY (VATS)/DECORTICATION Left 09/11/2021   Procedure: VIDEO ASSISTED THORACOSCOPY (VATS)/DECORTICATION;   Surgeon: Loreli Slot, MD;  Location: [facility] OR;  Service: Thoracic;  Laterality: Left;    Social History   Socioeconomic History   Marital status: Married    Spouse name: Not on file   Number of children: Not on file   Years of education: Not on file   Highest education level: Not on file  Occupational History   Not on file  Tobacco Use   Smoking status: Every Day    Packs/day: 1    Types: Cigarettes   Smokeless tobacco: Never  Vaping Use   Vaping Use: Never used  Substance and Sexual Activity   Alcohol use: Not Currently    Alcohol/week: 42.0 standard drinks of alcohol    Types: 42 Cans of beer per week    Comment: quit around 05/2021   Drug use: Not Currently   Sexual activity: Not on file  Other Topics Concern   Not on file  Social History Narrative   Not on file   Social Determinants of Health   Financial Resource Strain: Not on file  Food Insecurity: Not on file  Transportation Needs: Not on file  Physical Activity: Not on file  Stress: Not on file  Social Connections: Not on file  Intimate Partner Violence: Not on file    Family History  Problem Relation Age of Onset   Skin cancer Father  Multiple sclerosis Sister    Non-Hodgkin's lymphoma Brother     Allergies  Allergen Reactions   Ciprocin-Fluocin-Procin [Fluocinolone] Anaphylaxis    Hip hurt   Ciprofloxacin Other (See Comments)    Unknown reaction   Colchicine Other (See Comments)   Ezetimibe Nausea And Vomiting   Meloxicam Diarrhea and Swelling   Duloxetine Diarrhea, Nausea And Vomiting and Other (See Comments)    Altered mental status    Review of Systems  Skin:        Growth on hand  All other systems reviewed and are negative.      Objective:   BP 100/85   Pulse (!) 110   Ht 6' (1.829 m)   Wt 255 lb 3.2 oz (115.8 kg)   SpO2 91%   BMI 34.61 kg/m   Vitals:   01/23/23 1052  BP: 100/85  Pulse: (!) 110  Height: 6' (1.829 m)  Weight: 255 lb 3.2 oz (115.8 kg)  SpO2:  91%  BMI (Calculated): 34.6    Physical Exam Vitals and nursing note reviewed.  Constitutional:      Appearance: Normal appearance. He is normal weight.  HENT:     Head: Normocephalic and atraumatic.  Eyes:     Extraocular Movements: Extraocular movements intact.     Conjunctiva/sclera: Conjunctivae normal.     Pupils: Pupils are equal, round, and reactive to light.  Cardiovascular:     Rate and Rhythm: Normal rate and regular rhythm.     Pulses: Normal pulses.     Heart sounds: Normal heart sounds.  Pulmonary:     Effort: Pulmonary effort is normal.     Breath sounds: Normal breath sounds.  Neurological:     Mental Status: He is alert.  Psychiatric:        Mood and Affect: Mood normal.        Behavior: Behavior normal.        Thought Content: Thought content normal.        Judgment: Judgment normal.      No results found for any visits on 01/23/23.  No results found for this or any previous visit (from the past 2160 hour(s)).     Assessment & Plan:   Problem List Items Addressed This Visit       Active Problems   Anxiety    Will send refills for meds.  I would like to wean this, given the pain medications he is on.   We will discuss plan at follow up visit for this.         Chronic obstructive pulmonary disease (HCC)    Patient stable.  Well controlled with current therapy.   Continue current meds.        Vitamin D deficiency, unspecified    Checking labs today.  Will continue supplements as needed.       B12 deficiency due to diet    B12 injection given in the office today.  Will check levels at follow up appointment.  Repeat injection in 1 month.      Neoplasm of uncertain behavior of skin of hand - Primary    Will send a referral for dermatology for his hand.  I believe this is a wart, but I do not have the equipment in office to remove it.       Relevant Orders   Ambulatory referral to Dermatology    Return in about 1 month (around  02/23/2023) for F/U.   Total time spent: 20 minutes  Miki Kins, FNP  01/23/2023   This document may have been prepared by [facility] Voice Recognition software and as such may include unintentional dictation errors.

## 2023-01-26 NOTE — Assessment & Plan Note (Signed)
B12 injection given in the office today.  Will check levels at follow up appointment.  Repeat injection in 1 month.

## 2023-01-26 NOTE — Assessment & Plan Note (Signed)
Checking labs today.  Will continue supplements as needed.  

## 2023-01-28 ENCOUNTER — Telehealth: Payer: Self-pay | Admitting: Family

## 2023-01-28 ENCOUNTER — Emergency Department: Payer: Medicare HMO

## 2023-01-28 ENCOUNTER — Other Ambulatory Visit: Payer: Self-pay

## 2023-01-28 ENCOUNTER — Emergency Department
Admission: EM | Admit: 2023-01-28 | Discharge: 2023-01-28 | Disposition: A | Discharge status: Home / Self Care | Payer: Medicare HMO | Source: Home / Self Care | Attending: Emergency Medicine | Admitting: Emergency Medicine

## 2023-01-28 DIAGNOSIS — J810 Acute pulmonary edema: Secondary | ICD-10-CM

## 2023-01-28 DIAGNOSIS — R0789 Other chest pain: Secondary | ICD-10-CM | POA: Diagnosis not present

## 2023-01-28 DIAGNOSIS — M7989 Other specified soft tissue disorders: Secondary | ICD-10-CM | POA: Diagnosis not present

## 2023-01-28 DIAGNOSIS — J449 Chronic obstructive pulmonary disease, unspecified: Secondary | ICD-10-CM | POA: Diagnosis not present

## 2023-01-28 DIAGNOSIS — R600 Localized edema: Secondary | ICD-10-CM

## 2023-01-28 DIAGNOSIS — R0602 Shortness of breath: Secondary | ICD-10-CM | POA: Diagnosis not present

## 2023-01-28 DIAGNOSIS — R079 Chest pain, unspecified: Secondary | ICD-10-CM | POA: Diagnosis not present

## 2023-01-28 DIAGNOSIS — J811 Chronic pulmonary edema: Secondary | ICD-10-CM | POA: Diagnosis not present

## 2023-01-28 DIAGNOSIS — M79604 Pain in right leg: Secondary | ICD-10-CM | POA: Diagnosis not present

## 2023-01-28 LAB — TROPONIN I (HIGH SENSITIVITY): Troponin I (High Sensitivity): 4 ng/L (ref <18)

## 2023-01-28 LAB — CBC
HCT: 51.7 % (ref 39.0–52.0)
Hemoglobin: 17.2 g/dL — ABNORMAL HIGH (ref 13.0–17.0)
MCH: 29.4 pg (ref 26.0–34.0)
MCHC: 33.3 g/dL (ref 30.0–36.0)
MCV: 88.2 fL (ref 80.0–100.0)
Platelets: 198 10*3/uL (ref 150–400)
RBC: 5.86 MIL/uL — ABNORMAL HIGH (ref 4.22–5.81)
RDW: 14.9 % (ref 11.5–15.5)
WBC: 8.1 10*3/uL (ref 4.0–10.5)
nRBC: 0 % (ref 0.0–0.2)

## 2023-01-28 LAB — BASIC METABOLIC PANEL
Anion gap: 10 (ref 5–15)
BUN: 6 mg/dL — ABNORMAL LOW (ref 8–23)
CO2: 21 mmol/L — ABNORMAL LOW (ref 22–32)
Calcium: 8.9 mg/dL (ref 8.9–10.3)
Chloride: 100 mmol/L (ref 98–111)
Creatinine, Ser: 0.76 mg/dL (ref 0.61–1.24)
GFR, Estimated: >60 mL/min (ref >60)
Glucose, Bld: 124 mg/dL — ABNORMAL HIGH (ref 70–99)
Potassium: 3.9 mmol/L (ref 3.5–5.1)
Sodium: 131 mmol/L — ABNORMAL LOW (ref 135–145)

## 2023-01-28 LAB — BRAIN NATRIURETIC PEPTIDE: B Natriuretic Peptide: 23.8 pg/mL (ref 0.0–100.0)

## 2023-01-28 MED ORDER — OXYCODONE-ACETAMINOPHEN 5-325 MG PO TABS
1.0000 | ORAL_TABLET | Freq: Once | ORAL | Status: AC
  Administered 2023-01-28: 1 via ORAL
  Filled 2023-01-28: qty 1

## 2023-01-28 MED ORDER — FUROSEMIDE 20 MG PO TABS: 20.0000 mg | ORAL_TABLET | Freq: Every day | ORAL | 0 refills | Status: DC

## 2023-01-28 MED ORDER — FUROSEMIDE 10 MG/ML IJ SOLN
40.0000 mg | Freq: Once | INTRAMUSCULAR | Status: AC
  Administered 2023-01-28: 40 mg via INTRAVENOUS
  Filled 2023-01-28: qty 4

## 2023-01-28 NOTE — ED Triage Notes (Signed)
Pt comes with c/o cp that started few days ago. Pt states hard to breath and sob. Pt states heaviness to chest. Pt states right leg swelling. Pt states left too.

## 2023-01-28 NOTE — ED Provider Notes (Signed)
[facility] Provider Note    Event Date/Time   First MD Initiated Contact with Patient 01/28/23 1606     (approximate)   History   Chest Pain   HPI  Lance House is a 72 y.o. male with a history of anxiety, depression, alcohol dependence, COPD, and gout, who presents with bilateral leg swelling over the last several days, worse on the right but present in both legs.  The patient denies any significant redness or warmth to the legs.  He denies any trauma or injury.  He also reports increased shortness of breath over the last several weeks.  He mainly has shortness of breath with exertion although is able to ambulate around his house without any difficulty.  He states it is mainly when he is outside.  The patient states that he cannot sleep flat in his usually on a recliner although states that this is primarily due to chronic back pain.  The patient denies any fever or chills or any cough.  He states that he has some chest tightness associated with the shortness of breath but no actual chest pain.  I reviewed the past medical records per the patient was most densely seen by internal medicine on 7/3 for a follow-up appointment for his chronic conditions.   Physical Exam   Triage Vital Signs: ED Triage Vitals  Enc Vitals Group     BP 01/28/23 1413 (!) 134/90     Pulse Rate 01/28/23 1413 97     Resp 01/28/23 1413 19     Temp 01/28/23 1413 98 F (36.7 C)     Temp src --      SpO2 01/28/23 1413 94 %     Weight --      Height --      Head Circumference --      Peak Flow --      Pain Score 01/28/23 1415 5     Pain Loc --      Pain Edu? --      Excl. in GC? --     Most recent vital signs: Vitals:   01/28/23 1413  BP: (!) 134/90  Pulse: 97  Resp: 19  Temp: 98 F (36.7 C)  SpO2: 94%     General: Awake, no distress.  CV:  Good peripheral perfusion.  Resp:  Normal effort.  No wheezes or rales. Abd:  No distention.  Other:  2+ right lower  extremity edema, 1+ on the left.  2+ DP pulses bilaterally.  Normal cap refill.  No erythema, induration, or abnormal warmth.   ED Results / Procedures / Treatments   Labs (all labs ordered are listed, but only abnormal results are displayed) Labs Reviewed  BASIC METABOLIC PANEL - Abnormal; Notable for the following components:      Result Value   Sodium 131 (*)    CO2 21 (*)    Glucose, Bld 124 (*)    BUN 6 (*)    All other components within normal limits  CBC - Abnormal; Notable for the following components:   RBC 5.86 (*)    Hemoglobin 17.2 (*)    All other components within normal limits  BRAIN NATRIURETIC PEPTIDE  TROPONIN I (HIGH SENSITIVITY)     EKG  ED ECG REPORT I, Dionne Bucy, the attending physician, personally viewed and interpreted this ECG.  Date: 01/28/2023 EKG Time: 1410 Rate: 98 Rhythm: normal sinus rhythm QRS Axis: normal Intervals: normal ST/T Wave abnormalities: normal Narrative  Interpretation: no evidence of acute ischemia    RADIOLOGY  Chest x-ray: I independently viewed and interpreted the images; there is bilateral pulmonary edema with no focal consolidation  US venous RLE: No acute DVT  PROCEDURES:  Critical Care performed: No  Procedures   MEDICATIONS ORDERED IN ED: Medications  furosemide (LASIX) injection 40 mg (40 mg Intravenous Given 01/28/23 1652)  oxyCODONE-acetaminophen (PERCOCET/ROXICET) 5-325 MG per tablet 1 tablet (1 tablet Oral Given 01/28/23 1727)     IMPRESSION / MDM / ASSESSMENT AND PLAN / ED COURSE  I reviewed the triage vital signs and the nursing notes.  72 year old male with PMH as noted above presents with bilateral but somewhat asymmetrical lower extremity edema over the last several days along with subacute to chronic exertional shortness of breath and orthopnea.  Vital signs are normal, the patient is overall well-appearing, and has edema as described above.  Differential diagnosis includes, but is not  limited to, dependent edema, venous stasis, new onset CHF, chronic kidney disease.  Patient's presentation is most consistent with acute complicated illness / injury requiring diagnostic workup.  Initial lab workup is reassuring.  There is no leukocytosis or anemia.  Electrolytes and creatinine are normal.  Troponin is negative.  Given the duration of the symptoms there is no indication for repeat.  BNP is also not elevated.  Ultrasound shows no evidence of DVT in the RLE.  Chest x-ray does show possible pulmonary edema.  Overall presentation is most consistent with likely subacute new onset CHF.  We will give a dose of IV Lasix and reassess.  ----------------------------------------- 5:44 PM on 01/28/2023 -----------------------------------------   The patient has received the IV Lasix.  I considered whether he may benefit from inpatient admission given the concern for possible new onset CHF.  However, at this time, the patient has no hypoxia or oxygen requirement.  He reports shortness of breath with longer exertion but is able to complete his ADLs without difficulty.  BNP and troponin are negative.  Other than the leg swelling, his symptoms are subacute to chronic.  I discussed this with the patient and he strongly prefers to go home and get further workup as an outpatient which I feel is reasonable.  I counseled the patient on the results of the workup and the plan of care.  I have ordered cardiology referral.  I have prescribed a 10-day course of p.o. Lasix for home.  I gave him strict return precautions and he expressed understanding.   FINAL CLINICAL IMPRESSION(S) / ED DIAGNOSES   Final diagnoses:  Peripheral edema  Acute pulmonary edema (HCC)  Atypical chest pain     Rx / DC Orders   ED Discharge Orders          Ordered    Ambulatory referral to Cardiology       Comments: If you have not heard from the Cardiology office within the next 72 hours please call [phone].    01/28/23 1741    furosemide (LASIX) 20 MG tablet  Daily        01/28/23 1741             Note:  This document was prepared using Dragon voice recognition software and may include unintentional dictation errors.    Dionne Bucy, MD 01/28/23 [phone]

## 2023-01-28 NOTE — Discharge Instructions (Addendum)
Your ultrasound did not show any blood clots.  There is fluid (edema) both in your legs and also likely in your lungs. This is often caused by the heart.    Take the lasix for the next 10 days.  Keep your legs elevated when possible.  Follow up with your primary care provider and with a cardiologist (heart doctor) - we have made a referral and you will be contacted for an appointment.  Return to the ER immediately for new, worsening or persistent severe shortness of breath, chest pain, weakness, swelling, redness to the legs, fever, or any other new or worsening symptoms that concern you.

## 2023-01-28 NOTE — ED Provider Triage Note (Signed)
Emergency Medicine Provider Triage Evaluation Note  Lance House , a 72 y.o. male  was evaluated in triage.  Pt complains of swelling in legs, cp, some sob, no hx chf per patient.  Review of Systems  Positive:  Negative:   Physical Exam  BP (!) 134/90   Pulse 97   Temp 98 F (36.7 C)   Resp 19   SpO2 94%  Gen:   Awake, no distress   Resp:  Normal effort  MSK:   Moves extremities without difficulty , swelling on right leg is worse than left Other:    Medical Decision Making  Medically screening exam initiated at 2:18 PM.  Appropriate orders placed.  Lance House was informed that the remainder of the evaluation will be completed by another provider, this initial triage assessment does not replace that evaluation, and the importance of remaining in the ED until their evaluation is complete.  Chf protocols, [place] right lower extremity   Faythe Ghee, PA-C 01/28/23 1419

## 2023-01-28 NOTE — Telephone Encounter (Signed)
Patient called and his right leg is swollen from the knee down and he believes it may be fluid build up but is unsure. What should he do?

## 2023-01-30 ENCOUNTER — Other Ambulatory Visit: Payer: Self-pay | Admitting: Family

## 2023-02-07 ENCOUNTER — Telehealth: Payer: Self-pay

## 2023-02-08 ENCOUNTER — Emergency Department: Payer: Medicare HMO

## 2023-02-08 ENCOUNTER — Other Ambulatory Visit: Payer: Self-pay

## 2023-02-08 ENCOUNTER — Emergency Department
Admission: EM | Admit: 2023-02-08 | Discharge: 2023-02-08 | Disposition: A | Discharge status: Home / Self Care | Payer: Medicare HMO | Attending: Emergency Medicine | Admitting: Emergency Medicine

## 2023-02-08 DIAGNOSIS — Z7901 Long term (current) use of anticoagulants: Secondary | ICD-10-CM | POA: Insufficient documentation

## 2023-02-08 DIAGNOSIS — J432 Centrilobular emphysema: Secondary | ICD-10-CM | POA: Diagnosis not present

## 2023-02-08 DIAGNOSIS — J811 Chronic pulmonary edema: Secondary | ICD-10-CM | POA: Diagnosis not present

## 2023-02-08 DIAGNOSIS — R0989 Other specified symptoms and signs involving the circulatory and respiratory systems: Secondary | ICD-10-CM | POA: Diagnosis not present

## 2023-02-08 DIAGNOSIS — R079 Chest pain, unspecified: Secondary | ICD-10-CM | POA: Diagnosis not present

## 2023-02-08 DIAGNOSIS — I2510 Atherosclerotic heart disease of native coronary artery without angina pectoris: Secondary | ICD-10-CM | POA: Diagnosis not present

## 2023-02-08 DIAGNOSIS — E039 Hypothyroidism, unspecified: Secondary | ICD-10-CM | POA: Insufficient documentation

## 2023-02-08 DIAGNOSIS — I10 Essential (primary) hypertension: Secondary | ICD-10-CM | POA: Diagnosis not present

## 2023-02-08 DIAGNOSIS — R918 Other nonspecific abnormal finding of lung field: Secondary | ICD-10-CM | POA: Diagnosis not present

## 2023-02-08 DIAGNOSIS — R0789 Other chest pain: Secondary | ICD-10-CM

## 2023-02-08 DIAGNOSIS — J810 Acute pulmonary edema: Secondary | ICD-10-CM | POA: Diagnosis not present

## 2023-02-08 DIAGNOSIS — R590 Localized enlarged lymph nodes: Secondary | ICD-10-CM | POA: Diagnosis not present

## 2023-02-08 DIAGNOSIS — J449 Chronic obstructive pulmonary disease, unspecified: Secondary | ICD-10-CM | POA: Insufficient documentation

## 2023-02-08 LAB — BASIC METABOLIC PANEL
Anion gap: 9 (ref 5–15)
BUN: 7 mg/dL — ABNORMAL LOW (ref 8–23)
CO2: 21 mmol/L — ABNORMAL LOW (ref 22–32)
Calcium: 8.7 mg/dL — ABNORMAL LOW (ref 8.9–10.3)
Chloride: 101 mmol/L (ref 98–111)
Creatinine, Ser: 0.79 mg/dL (ref 0.61–1.24)
GFR, Estimated: >60 mL/min (ref >60)
Glucose, Bld: 141 mg/dL — ABNORMAL HIGH (ref 70–99)
Potassium: 3.6 mmol/L (ref 3.5–5.1)
Sodium: 131 mmol/L — ABNORMAL LOW (ref 135–145)

## 2023-02-08 LAB — TROPONIN I (HIGH SENSITIVITY)
Troponin I (High Sensitivity): 3 ng/L (ref <18)
Troponin I (High Sensitivity): 4 ng/L (ref <18)

## 2023-02-08 LAB — CBC
HCT: 52.5 % — ABNORMAL HIGH (ref 39.0–52.0)
Hemoglobin: 17.5 g/dL — ABNORMAL HIGH (ref 13.0–17.0)
MCH: 29.8 pg (ref 26.0–34.0)
MCHC: 33.3 g/dL (ref 30.0–36.0)
MCV: 89.4 fL (ref 80.0–100.0)
Platelets: 212 10*3/uL (ref 150–400)
RBC: 5.87 MIL/uL — ABNORMAL HIGH (ref 4.22–5.81)
RDW: 14.8 % (ref 11.5–15.5)
WBC: 7.5 10*3/uL (ref 4.0–10.5)
nRBC: 0 % (ref 0.0–0.2)

## 2023-02-08 MED ORDER — FUROSEMIDE 20 MG PO TABS: 20.0000 mg | ORAL_TABLET | Freq: Every day | ORAL | 0 refills | Status: DC

## 2023-02-08 MED ORDER — OXYCODONE-ACETAMINOPHEN 5-325 MG PO TABS
1.0000 | ORAL_TABLET | Freq: Once | ORAL | Status: AC
  Administered 2023-02-08: 1 via ORAL
  Filled 2023-02-08: qty 1

## 2023-02-08 MED ORDER — FUROSEMIDE 10 MG/ML IJ SOLN
40.0000 mg | Freq: Once | INTRAMUSCULAR | Status: AC
  Administered 2023-02-08: 40 mg via INTRAVENOUS
  Filled 2023-02-08: qty 4

## 2023-02-08 MED ORDER — IOHEXOL 350 MG/ML SOLN
75.0000 mL | Freq: Once | INTRAVENOUS | Status: AC | PRN
  Administered 2023-02-08: 75 mL via INTRAVENOUS

## 2023-02-08 MED ORDER — IOHEXOL 350 MG/ML SOLN: 75.0000 mL | Freq: Once | INTRAVENOUS | Status: DC | PRN

## 2023-02-08 NOTE — ED Triage Notes (Addendum)
Pt reports worsening chest pain and shortness of breath, pt was seen here 10 days ago for same and sent home with RX for lasix. Pt also c/o bilateral leg swelling.

## 2023-02-08 NOTE — ED Provider Notes (Signed)
[facility] Provider Note    Event Date/Time   First MD Initiated Contact with Patient 02/08/23 1728     (approximate)   History   Chief Complaint Chest Pain and Shortness of Breath   HPI  Lance House is a 72 y.o. male with past medical history of hyperlipidemia, COPD, intracranial hemorrhage, alcohol abuse, gout, and anxiety who presents to the ED complaining of chest pain.  Patient reports that he has had pressure in his chest intermittently for the past couple of weeks, associated with some dyspnea on exertion as well as swelling in his legs.  He was seen in the ED for these symptoms 11 days ago, found to have pulmonary edema and was discharged home with course of Lasix.  He states that the swelling in his legs has slightly improved, but he continues to get out of breath with discomfort in his chest.  He denies any fevers or cough.  He reports having an appointment scheduled with cardiology in about 2 months.     Physical Exam   Triage Vital Signs: ED Triage Vitals  Encounter Vitals Group     BP 02/08/23 1400 135/77     Systolic BP Percentile --      Diastolic BP Percentile --      Pulse Rate 02/08/23 1400 97     Resp 02/08/23 1400 20     Temp 02/08/23 1400 97.9 F (36.6 C)     Temp Source 02/08/23 1400 Oral     SpO2 02/08/23 1400 95 %     Weight 02/08/23 1401 170 lb (77.1 kg)     Height 02/08/23 1401 6\' 1"  (1.854 m)     Head Circumference --      Peak Flow --      Pain Score 02/08/23 1400 7     Pain Loc --      Pain Education --      Exclude from Growth Chart --     Most recent vital signs: Vitals:   02/08/23 1400 02/08/23 1920  BP: 135/77 (!) 123/90  Pulse: 97 69  Resp: 20 13  Temp: 97.9 F (36.6 C) 98.1 F (36.7 C)  SpO2: 95% 97%    Constitutional: Alert and oriented. Eyes: Conjunctivae are normal. Head: Atraumatic. Nose: No congestion/rhinnorhea. Mouth/Throat: Mucous membranes are moist.  Cardiovascular: Normal rate,  regular rhythm. Grossly normal heart sounds.  2+ radial pulses bilaterally. Respiratory: Normal respiratory effort.  No retractions. Lungs CTAB. Gastrointestinal: Soft and nontender. No distention. Musculoskeletal: No lower extremity tenderness, trace pitting edema to knees bilaterally. Neurologic:  Normal speech and language. No gross focal neurologic deficits are appreciated.    ED Results / Procedures / Treatments   Labs (all labs ordered are listed, but only abnormal results are displayed) Labs Reviewed  BASIC METABOLIC PANEL - Abnormal; Notable for the following components:      Result Value   Sodium 131 (*)    CO2 21 (*)    Glucose, Bld 141 (*)    BUN 7 (*)    Calcium 8.7 (*)    All other components within normal limits  CBC - Abnormal; Notable for the following components:   RBC 5.87 (*)    Hemoglobin 17.5 (*)    HCT 52.5 (*)    All other components within normal limits  TROPONIN I (HIGH SENSITIVITY)  TROPONIN I (HIGH SENSITIVITY)     EKG  ED ECG REPORT I, [name], the attending physician, personally viewed  and interpreted this ECG.   Date: 02/08/2023  EKG Time: 14:10  Rate: 96  Rhythm: normal sinus rhythm  Axis: LAD  Intervals:none  ST&T Change: None  RADIOLOGY Chest x-ray reviewed interpreted by me with mild pulmonary edema, improved compared to previous with no focal infiltrate.  PROCEDURES:  Critical Care performed: No  Procedures   MEDICATIONS ORDERED IN ED: Medications  iohexol (OMNIPAQUE) 350 MG/ML injection 75 mL (has no administration in time range)  furosemide (LASIX) injection 40 mg (40 mg Intravenous Given 02/08/23 1753)  iohexol (OMNIPAQUE) 350 MG/ML injection 75 mL (75 mLs Intravenous Contrast Given 02/08/23 1800)  oxyCODONE-acetaminophen (PERCOCET/ROXICET) 5-325 MG per tablet 1 tablet (1 tablet Oral Given 02/08/23 1818)     IMPRESSION / MDM / ASSESSMENT AND PLAN / ED COURSE  I reviewed the triage vital signs and the nursing  notes.                              72 y.o. male with past medical history of hypertension, hyperlipidemia, atrial fibrillation on Xarelto, and hypothyroidism who presents to the ED complaining of ongoing dyspnea on exertion, chest discomfort, and swelling in his legs for about the past 2 weeks.  Patient's presentation is most consistent with acute presentation with potential threat to life or bodily function.  Differential diagnosis includes, but is not limited to, ACS, PE, pneumonia, pneumothorax, CHF, COPD, anemia, electrode abnormality, AKI.  Patient nontoxic-appearing and in no acute distress, vital signs are unremarkable.  EKG shows no evidence of arrhythmia or ischemia, chest x-ray with improved pulmonary edema compared to imaging performed 11 days ago on his prior ED visit.  Labs without significant anemia, leukocytosis, show Dr. Danelle Earthly covering his eye.  Troponin within normal limits, will trend given his intermittent discomfort.  Given he continues to have chest pain and shortness of breath despite improvement in pulmonary edema, will also further assess with CTA for PE.  Will also diurese with IV Lasix here in the ED.  Repeat troponin within normal limits, CTA chest is negative for PE, does show possible interstitial lung disease with enlarged lymph nodes.  No findings to suggest pneumonia, patient informed of need to follow-up with PCP for repeat imaging in 3 months.  He is appropriate for outpatient management, will refill Lasix and provide referral to CHF clinic.  He was counseled to return to the ED for new or worsening symptoms, patient agrees with plan.      FINAL CLINICAL IMPRESSION(S) / ED DIAGNOSES   Final diagnoses:  Atypical chest pain  Acute pulmonary edema (HCC)     Rx / DC Orders   ED Discharge Orders          Ordered    AMB referral to CHF clinic        02/08/23 1930    furosemide (LASIX) 20 MG tablet  Daily        02/08/23 1931             Note:   This document was prepared using Dragon voice recognition software and may include unintentional dictation errors.   [name], MD 02/08/23 [phone]

## 2023-02-08 NOTE — ED Notes (Signed)
Assisted pt with emptying urinal. 800 ML

## 2023-02-11 ENCOUNTER — Other Ambulatory Visit: Payer: Self-pay | Admitting: Family

## 2023-02-11 ENCOUNTER — Telehealth: Payer: Self-pay | Admitting: Family

## 2023-02-11 NOTE — Telephone Encounter (Signed)
Patient called stating the pharmacy said one of [place] will physically have to call and speak to the pharmacist and tell them it is okay to go ahead and fill his clonazepam. Please send to [facility] [address]

## 2023-02-12 ENCOUNTER — Other Ambulatory Visit: Payer: Self-pay

## 2023-02-13 NOTE — Telephone Encounter (Signed)
Spoke with pt verbalized understanding 

## 2023-02-14 ENCOUNTER — Telehealth: Payer: Self-pay | Admitting: Family

## 2023-02-14 MED ORDER — OXYCODONE HCL 5 MG PO TABS: 5.0000 mg | ORAL_TABLET | Freq: Three times a day (TID) | ORAL | 0 refills | Status: DC | PRN

## 2023-02-14 NOTE — Telephone Encounter (Signed)
Patient needs his pain meds sent in today. He will be picking it up tomorrow.   [facility] [address]

## 2023-02-14 NOTE — Telephone Encounter (Signed)
See other task

## 2023-02-15 ENCOUNTER — Telehealth: Payer: Self-pay

## 2023-02-15 NOTE — Telephone Encounter (Signed)
Transition Care Management Follow-up Telephone Call Date of discharge and from where: [facility] 7/19 How have you been since you were released from the hospital? Doing ok Any questions or concerns? No  Items Reviewed: Did the pt receive and understand the discharge instructions provided? Yes  Medications obtained and verified? Yes  Other? No  Any new allergies since your discharge? No  Dietary orders reviewed? No Do you have support at home? Yes     Follow up appointments reviewed:  PCP Hospital f/u appt confirmed? Yes  Scheduled to see PCP on 8/6 @ . Specialist Hospital f/u appt confirmed? Yes  Scheduled to see HEAT[facility] on 8/8 @ . Are transportation arrangements needed? No  If their condition worsens, is the pt aware to call PCP or go to the Emergency Dept.? Yes Was the patient provided with contact information for the PCP's office or ED? Yes Was to pt encouraged to call back with questions or concerns? Yes

## 2023-02-26 ENCOUNTER — Encounter: Payer: Self-pay | Admitting: Family

## 2023-02-26 ENCOUNTER — Ambulatory Visit (INDEPENDENT_AMBULATORY_CARE_PROVIDER_SITE_OTHER): Payer: Medicare HMO | Admitting: Family

## 2023-02-26 VITALS — BP 132/70 | HR 95 | Ht 72.0 in | Wt 242.0 lb

## 2023-02-26 DIAGNOSIS — E782 Mixed hyperlipidemia: Secondary | ICD-10-CM | POA: Diagnosis not present

## 2023-02-26 DIAGNOSIS — E559 Vitamin D deficiency, unspecified: Secondary | ICD-10-CM

## 2023-02-26 DIAGNOSIS — R7303 Prediabetes: Secondary | ICD-10-CM | POA: Diagnosis not present

## 2023-02-26 DIAGNOSIS — M48061 Spinal stenosis, lumbar region without neurogenic claudication: Secondary | ICD-10-CM | POA: Diagnosis not present

## 2023-02-26 DIAGNOSIS — J439 Emphysema, unspecified: Secondary | ICD-10-CM | POA: Diagnosis not present

## 2023-02-26 DIAGNOSIS — F419 Anxiety disorder, unspecified: Secondary | ICD-10-CM

## 2023-02-26 DIAGNOSIS — E538 Deficiency of other specified B group vitamins: Secondary | ICD-10-CM

## 2023-02-26 DIAGNOSIS — J869 Pyothorax without fistula: Secondary | ICD-10-CM | POA: Diagnosis not present

## 2023-02-26 MED ORDER — OXYCODONE HCL 5 MG PO TABS: 5.0000 mg | ORAL_TABLET | Freq: Three times a day (TID) | ORAL | 0 refills | Status: DC | PRN

## 2023-02-26 MED ORDER — CYANOCOBALAMIN 1000 MCG/ML IJ SOLN
1000.0000 ug | Freq: Once | INTRAMUSCULAR | Status: AC
Start: 2023-02-26 — End: 2023-02-26
  Administered 2023-02-26: 1000 ug via INTRAMUSCULAR

## 2023-02-26 MED ORDER — CLONAZEPAM 1 MG PO TABS: 1.0000 mg | ORAL_TABLET | Freq: Two times a day (BID) | ORAL | 2 refills | Status: DC

## 2023-02-27 NOTE — Progress Notes (Unsigned)
Advanced Heart Failure Clinic Note   Referring Physician: [name], MD (EDP) PCP: Miki Kins, FNP (last seen 07/24) PCP-Cardiologist: None   HPI:  [name] is a 72 y/o male with a history of hyperlipidemia, COPD, intracranial hemorrhage, alcohol abuse, gout, anxiety              and chronic heart failure (newly diagnosed 07/24).   Was in the ED 01/28/23 due to bilateral leg swelling over the last several days, worse on the right along with increased SOB. Ultrasound negative for DVT. IV lasix provided. Was in the ED 02/08/23 due to chest pain and SOB. Was given oral lasix 11 days prior but still was SOB. Troponin normal. IV lasix provided. Chest CTA negative for PE.    Echo 08/18/21: EF 60-65% along with Grade I DD  He presents today for his initial visit with a chief complaint of    Review of Systems: [y] = yes, [ ]  = no   General: Weight gain [ ] ; Weight loss [ ] ; Anorexia [ ] ; Fatigue [ ] ; Fever [ ] ; Chills [ ] ; Weakness [ ]   Cardiac: Chest pain/pressure [ ] ; Resting SOB [ ] ; Exertional SOB [ ] ; Orthopnea [ ] ; Pedal Edema [ ] ; Palpitations [ ] ; Syncope [ ] ; Presyncope [ ] ; Paroxysmal nocturnal dyspnea[ ]   Pulmonary: Cough [ ] ; Wheezing[ ] ; Hemoptysis[ ] ; Sputum [ ] ; Snoring [ ]   GI: Vomiting[ ] ; Dysphagia[ ] ; Melena[ ] ; Hematochezia [ ] ; Heartburn[ ] ; Abdominal pain [ ] ; Constipation [ ] ; Diarrhea [ ] ; BRBPR [ ]   GU: Hematuria[ ] ; Dysuria [ ] ; Nocturia[ ]   Vascular: Pain in legs with walking [ ] ; Pain in feet with lying flat [ ] ; Non-healing sores [ ] ; Stroke [ ] ; TIA [ ] ; Slurred speech [ ] ;  Neuro: Headaches[ ] ; Vertigo[ ] ; Seizures[ ] ; Paresthesias[ ] ;Blurred vision [ ] ; Diplopia [ ] ; Vision changes [ ]   Ortho/Skin: Arthritis [ ] ; Joint pain [ ] ; Muscle pain [ ] ; Joint swelling [ ] ; Back Pain [ ] ; Rash [ ]   Psych: Depression[ ] ; Anxiety[ ]   Heme: Bleeding problems [ ] ; Clotting disorders [ ] ; Anemia [ ]   Endocrine: Diabetes [ ] ; Thyroid dysfunction[ ]    Past Medical  History:  Diagnosis Date   Abdominal pain 04/08/2019   Anxiety    Anxiety hyperventilation 05/08/2019   Closed compression fracture of L4 vertebra (HCC) 08/18/2021   Closed fracture of distal fibula 02/13/2021   Compression fracture of thoracic vertebra (HCC) 08/18/2021   Depression    Elevated troponin    Gout    Hypokalemia 08/16/2021   Hyponatremia 08/16/2021   ICH (intracerebral hemorrhage) (HCC) 08/15/2021   Incarcerated left inguinal hernia    Injury of right ankle 02/13/2021   Left flank pain 02/03/2013   Other fatigue 10/25/2022   Pain    Pressure injury of skin 09/11/2021   Rhabdomyolysis 08/16/2021    Current Outpatient Medications  Medication Sig Dispense Refill   [START ON 03/14/2023] clonazePAM (KLONOPIN) 1 MG tablet Take 1 tablet (1 mg total) by mouth 2 (two) times daily. 60 tablet 2   folic acid (FOLVITE) 1 MG tablet TAKE 1 TABLET BY MOUTH EVERY DAY 90 tablet 1   furosemide (LASIX) 20 MG tablet Take 1 tablet (20 mg total) by mouth daily for 10 days. 10 tablet 0   gabapentin (NEURONTIN) 400 MG capsule TAKE 1 CAPSULE BY MOUTH THREE TIMES DAILY 90 capsule 0   hydrOXYzine (ATARAX) 25 MG tablet TAKE  1 TABLET BY MOUTH UP TO THREE TIMES DAILY AS NEEDED FOR ITCHING 90 tablet 3   [START ON 03/14/2023] oxyCODONE (OXY IR/ROXICODONE) 5 MG immediate release tablet Take 1 tablet (5 mg total) by mouth 3 (three) times daily as needed for severe pain. 90 tablet 0   No current facility-administered medications for this visit.    Allergies  Allergen Reactions   Ciprocin-Fluocin-Procin [Fluocinolone] Anaphylaxis    Hip hurt   Ciprofloxacin Other (See Comments)    Unknown reaction   Colchicine Other (See Comments)   Ezetimibe Nausea And Vomiting   Meloxicam Diarrhea and Swelling   Duloxetine Diarrhea, Nausea And Vomiting and Other (See Comments)    Altered mental status      Social History   Socioeconomic History   Marital status: Married    Spouse name: Not on file    Number of children: Not on file   Years of education: Not on file   Highest education level: Not on file  Occupational History   Not on file  Tobacco Use   Smoking status: Every Day    Current packs/day: 1.00    Types: Cigarettes   Smokeless tobacco: Never  Vaping Use   Vaping status: Never Used  Substance and Sexual Activity   Alcohol use: Not Currently    Alcohol/week: 42.0 standard drinks of alcohol    Types: 42 Cans of beer per week    Comment: quit around 05/2021   Drug use: Not Currently   Sexual activity: Not on file  Other Topics Concern   Not on file  Social History Narrative   Not on file   Social Determinants of Health   Financial Resource Strain: Not on file  Food Insecurity: Not on file  Transportation Needs: Not on file  Physical Activity: Not on file  Stress: Not on file  Social Connections: Not on file  Intimate Partner Violence: Not on file      Family History  Problem Relation Age of Onset   Skin cancer Father    Multiple sclerosis Sister    Non-Hodgkin's lymphoma Brother        PHYSICAL EXAM: General:  Well appearing. No respiratory difficulty HEENT: normal Neck: supple. no JVD. Carotids 2+ bilat; no bruits. No lymphadenopathy or thyromegaly appreciated. Cor: PMI nondisplaced. Regular rate & rhythm. No rubs, gallops or murmurs. Lungs: clear Abdomen: soft, nontender, nondistended. No hepatosplenomegaly. No bruits or masses. Good bowel sounds. Extremities: no cyanosis, clubbing, rash, edema Neuro: alert & oriented x 3, cranial nerves grossly intact. moves all 4 extremities w/o difficulty. Affect pleasant.  ECG:   ASSESSMENT & PLAN:  1: Chronic heart failure with preserved ejection fraction- - suspect due to  - NYHA class - euvolemic - weighing - Echo 08/18/21: EF 60-65% along with Grade I DD - continue - BNP 01/28/23 was 23.8  2: COPD- - BMP 02/08/23 reviewed and showed sodium 131, potassium 3.6, creatinine 0.79 & GFR >60  3:  Anxiety/ depression- - saw PCP Talbert Forest) 07/24  4: Alcohol use- -  Delma Freeze, FNP 02/27/23

## 2023-02-28 ENCOUNTER — Encounter: Payer: Self-pay | Admitting: Family

## 2023-02-28 ENCOUNTER — Ambulatory Visit: Payer: Medicare HMO | Attending: Emergency Medicine | Admitting: Family

## 2023-02-28 VITALS — BP 114/83 | HR 88 | Resp 16 | Wt 247.0 lb

## 2023-02-28 DIAGNOSIS — F1091 Alcohol use, unspecified, in remission: Secondary | ICD-10-CM | POA: Insufficient documentation

## 2023-02-28 DIAGNOSIS — I5032 Chronic diastolic (congestive) heart failure: Secondary | ICD-10-CM | POA: Insufficient documentation

## 2023-02-28 DIAGNOSIS — F17210 Nicotine dependence, cigarettes, uncomplicated: Secondary | ICD-10-CM | POA: Insufficient documentation

## 2023-02-28 DIAGNOSIS — J449 Chronic obstructive pulmonary disease, unspecified: Secondary | ICD-10-CM | POA: Diagnosis not present

## 2023-02-28 DIAGNOSIS — I5030 Unspecified diastolic (congestive) heart failure: Secondary | ICD-10-CM | POA: Diagnosis not present

## 2023-02-28 DIAGNOSIS — E785 Hyperlipidemia, unspecified: Secondary | ICD-10-CM | POA: Diagnosis not present

## 2023-02-28 DIAGNOSIS — F418 Other specified anxiety disorders: Secondary | ICD-10-CM | POA: Diagnosis not present

## 2023-02-28 DIAGNOSIS — F419 Anxiety disorder, unspecified: Secondary | ICD-10-CM

## 2023-02-28 DIAGNOSIS — Z789 Other specified health status: Secondary | ICD-10-CM | POA: Diagnosis not present

## 2023-02-28 NOTE — Patient Instructions (Signed)
Get compression socks and put them on every morning with removal at bedtime  Begin weighing daily and call for an overnight weight gain of 3 pounds or more or a weekly weight gain of more than 5 pounds.

## 2023-02-28 NOTE — Progress Notes (Signed)
   02/28/23 1205  ReDS Vest / Clip  Station Marker D  Ruler Value 35  ReDS Value Range < 36  ReDS Actual Value 32

## 2023-03-06 ENCOUNTER — Telehealth: Payer: Self-pay | Admitting: Family

## 2023-03-06 NOTE — Telephone Encounter (Signed)
Patient called in and states he had to take some extra of his clonazepam. He is requesting a week supply of 0.5 be sent [facility]. Please advise.

## 2023-03-08 ENCOUNTER — Other Ambulatory Visit: Payer: Self-pay | Admitting: Family

## 2023-03-14 ENCOUNTER — Telehealth: Payer: Self-pay | Admitting: Family

## 2023-03-14 NOTE — Telephone Encounter (Signed)
Patient called needing [place] to call [facility] and authorize that they can go ahead and fill his clonazepam. The Rx was written for a start date of todays date. I called [facility] and spoke with Pharmacy Manager, Neysa Bonito, and gave her authorization to go ahead and fill this medication. Patient is aware that insurance will not cover it until 8/26 but he is willing to pay cash price for this. Patient has been made aware that the pharmacy is filling this for him.

## 2023-03-18 ENCOUNTER — Ambulatory Visit
Admission: RE | Admit: 2023-03-18 | Discharge: 2023-03-18 | Disposition: A | Discharge status: Home / Self Care | Payer: Medicare HMO | Source: Ambulatory Visit | Attending: Family | Admitting: Family

## 2023-03-18 DIAGNOSIS — F17200 Nicotine dependence, unspecified, uncomplicated: Secondary | ICD-10-CM | POA: Diagnosis not present

## 2023-03-18 DIAGNOSIS — I5032 Chronic diastolic (congestive) heart failure: Secondary | ICD-10-CM | POA: Diagnosis not present

## 2023-03-18 DIAGNOSIS — I509 Heart failure, unspecified: Secondary | ICD-10-CM | POA: Diagnosis not present

## 2023-03-18 DIAGNOSIS — E785 Hyperlipidemia, unspecified: Secondary | ICD-10-CM | POA: Insufficient documentation

## 2023-03-18 DIAGNOSIS — I082 Rheumatic disorders of both aortic and tricuspid valves: Secondary | ICD-10-CM | POA: Insufficient documentation

## 2023-03-18 DIAGNOSIS — J449 Chronic obstructive pulmonary disease, unspecified: Secondary | ICD-10-CM | POA: Diagnosis not present

## 2023-03-18 LAB — ECHOCARDIOGRAM COMPLETE
AR max vel: 2.1 cm2
AV Area VTI: 2.04 cm2
AV Area mean vel: 1.83 cm2
AV Mean grad: 8 mmHg
AV Peak grad: 14.6 mmHg
Ao pk vel: 1.91 m/s
Area-P 1/2: 3.08 cm2
Calc EF: 55 %
MV VTI: 3.13 cm2
S' Lateral: 3.2 cm
Single Plane A2C EF: 52.9 %
Single Plane A4C EF: 51.7 %

## 2023-03-18 NOTE — Progress Notes (Signed)
*  PRELIMINARY RESULTS* Echocardiogram 2D Echocardiogram has been performed.  Carolyne Fiscal 03/18/2023, 12:10 PM

## 2023-03-21 ENCOUNTER — Other Ambulatory Visit: Payer: Self-pay | Admitting: Family

## 2023-03-21 ENCOUNTER — Encounter: Payer: Self-pay | Admitting: Family

## 2023-03-21 ENCOUNTER — Other Ambulatory Visit (HOSPITAL_COMMUNITY): Payer: Self-pay

## 2023-03-21 ENCOUNTER — Ambulatory Visit: Payer: Medicare HMO | Attending: Family | Admitting: Family

## 2023-03-21 ENCOUNTER — Other Ambulatory Visit: Payer: Self-pay

## 2023-03-21 VITALS — BP 112/76 | HR 85 | Ht 73.0 in | Wt 248.0 lb

## 2023-03-21 DIAGNOSIS — M549 Dorsalgia, unspecified: Secondary | ICD-10-CM | POA: Diagnosis not present

## 2023-03-21 DIAGNOSIS — I5032 Chronic diastolic (congestive) heart failure: Secondary | ICD-10-CM | POA: Insufficient documentation

## 2023-03-21 DIAGNOSIS — F17210 Nicotine dependence, cigarettes, uncomplicated: Secondary | ICD-10-CM | POA: Insufficient documentation

## 2023-03-21 DIAGNOSIS — I272 Pulmonary hypertension, unspecified: Secondary | ICD-10-CM | POA: Insufficient documentation

## 2023-03-21 DIAGNOSIS — Z789 Other specified health status: Secondary | ICD-10-CM

## 2023-03-21 DIAGNOSIS — F419 Anxiety disorder, unspecified: Secondary | ICD-10-CM

## 2023-03-21 DIAGNOSIS — F32A Depression, unspecified: Secondary | ICD-10-CM | POA: Diagnosis not present

## 2023-03-21 DIAGNOSIS — M109 Gout, unspecified: Secondary | ICD-10-CM | POA: Insufficient documentation

## 2023-03-21 DIAGNOSIS — E785 Hyperlipidemia, unspecified: Secondary | ICD-10-CM | POA: Insufficient documentation

## 2023-03-21 DIAGNOSIS — J449 Chronic obstructive pulmonary disease, unspecified: Secondary | ICD-10-CM | POA: Insufficient documentation

## 2023-03-21 DIAGNOSIS — Z7984 Long term (current) use of oral hypoglycemic drugs: Secondary | ICD-10-CM | POA: Diagnosis not present

## 2023-03-21 MED ORDER — FARXIGA 10 MG PO TABS
10.0000 mg | ORAL_TABLET | Freq: Every day | ORAL | 3 refills | Status: AC
Start: 2023-03-21 — End: ?
  Filled 2023-03-21: qty 30, 30d supply, fill #0

## 2023-03-21 NOTE — Patient Instructions (Addendum)
Get the compression socks and put them on every morning with removal at bedtime  Start taking Farxiga 10 gm (1 tablet) daily.

## 2023-03-21 NOTE — Progress Notes (Signed)
Referring Physician: [name], MD (EDP) PCP: Miki Kins, FNP (last seen 07/24) Cardiologist: Debbe Odea, MD (to see 09/24)  HPI:  Mr Filipowicz is a 72 y/o male with a history of hyperlipidemia, COPD, intracranial hemorrhage, alcohol abuse, gout, anxiety and chronic heart failure (newly diagnosed 07/24).   Was in the ED 01/28/23 due to bilateral leg swelling over the last several days, worse on the right along with increased SOB. Ultrasound negative for DVT. IV lasix provided. Was in the ED 02/08/23 due to chest pain and SOB. Was given oral lasix 11 days prior but still was SOB. Troponin normal. IV lasix provided. Chest CTA negative for PE.    Echo 08/18/21: EF 60-65% along with Grade I DD Echo 03/18/23: EF 55-60% with mild LVH along with moderately elevated PA pressure of 49.1 mmHg and flattened interventricular septum in systole  He presents today for a HF f/u visit with a chief complaint of moderate fatigue with minimal exertion. Chronic in nature. Has associated depression and pedal edema along with this. Denies shortness of breath, chest pain, cough, palpitations, abdominal distention, dizziness or difficulty sleeping. Denies adding salt and reports having a good appetite. Has a lot of stress with his marriage right now and feels like this stress is affecting his health.   Doesn't add salt but does eat TV dinners but tries to pick the lowest sodium choices. Is aware to keep his daily intake under 200mg . Continues to smoke but says that it's less than 1 ppd cigarettes.   ROS: All systems negative except as listed in HPI, PMH and Problem List.  SH:  Social History   Socioeconomic History   Marital status: Married    Spouse name: Not on file   Number of children: Not on file   Years of education: Not on file   Highest education level: Not on file  Occupational History   Not on file  Tobacco Use   Smoking status: Every Day    Current packs/day: 1.00    Types: Cigarettes    Smokeless tobacco: Never  Vaping Use   Vaping status: Never Used  Substance and Sexual Activity   Alcohol use: Not Currently    Alcohol/week: 42.0 standard drinks of alcohol    Types: 42 Cans of beer per week    Comment: quit around 05/2021   Drug use: Not Currently   Sexual activity: Not on file  Other Topics Concern   Not on file  Social History Narrative   Not on file   Social Determinants of Health   Financial Resource Strain: Not on file  Food Insecurity: Not on file  Transportation Needs: Not on file  Physical Activity: Not on file  Stress: Not on file  Social Connections: Not on file  Intimate Partner Violence: Not on file    FH:  Family History  Problem Relation Age of Onset   Skin cancer Father    Multiple sclerosis Sister    Non-Hodgkin's lymphoma Brother     Past Medical History:  Diagnosis Date   Abdominal pain 04/08/2019   Anxiety    Anxiety hyperventilation 05/08/2019   Closed compression fracture of L4 vertebra (HCC) 08/18/2021   Closed fracture of distal fibula 02/13/2021   Compression fracture of thoracic vertebra (HCC) 08/18/2021   Depression    Elevated troponin    Gout    Hypokalemia 08/16/2021   Hyponatremia 08/16/2021   ICH (intracerebral hemorrhage) (HCC) 08/15/2021   Incarcerated left inguinal hernia  Injury of right ankle 02/13/2021   Left flank pain 02/03/2013   Other fatigue 10/25/2022   Pain    Pressure injury of skin 09/11/2021   Rhabdomyolysis 08/16/2021    Current Outpatient Medications  Medication Sig Dispense Refill   Cholecalciferol (VITAMIN D3) 125 MCG (5000 UT) TABS Take 1 tablet by mouth daily.     clonazePAM (KLONOPIN) 1 MG tablet Take 1 tablet (1 mg total) by mouth 2 (two) times daily. 60 tablet 2   folic acid (FOLVITE) 1 MG tablet TAKE 1 TABLET BY MOUTH EVERY DAY 90 tablet 1   gabapentin (NEURONTIN) 400 MG capsule TAKE 1 CAPSULE BY MOUTH THREE TIMES DAILY 90 capsule 0   hydrOXYzine (ATARAX) 25 MG tablet TAKE 1  TABLET BY MOUTH UP TO THREE TIMES DAILY AS NEEDED FOR ITCHING 90 tablet 3   Methylcobalamin (B12 SL) Place 5,000 mcg under the tongue daily. Patient doesn't know dosage     oxyCODONE (OXY IR/ROXICODONE) 5 MG immediate release tablet Take 1 tablet (5 mg total) by mouth 3 (three) times daily as needed for severe pain. 90 tablet 0   pyridOXINE (VITAMIN B6) 100 MG tablet Take 100 mg by mouth daily.     No current facility-administered medications for this visit.   Vitals:   03/21/23 1105  BP: 112/76  Pulse: 85  SpO2: 93%  Weight: 248 lb (112.5 kg)  Height: 6\' 1"  (1.854 m)   Wt Readings from Last 3 Encounters:  03/21/23 248 lb (112.5 kg)  02/28/23 247 lb (112 kg)  02/26/23 242 lb (109.8 kg)   Lab Results  Component Value Date   CREATININE 0.79 02/08/2023   CREATININE 0.76 01/28/2023   CREATININE 0.74 (L) 10/22/2022   PHYSICAL EXAM:  General:  Well appearing. No resp difficulty HEENT: normal Neck: supple. JVP flat. No lymphadenopathy or thryomegaly appreciated. Cor: PMI normal. Regular rate & rhythm. No rubs, gallops or murmurs. Lungs: clear Abdomen: soft, nontender, nondistended. No hepatosplenomegaly. Extremities: no cyanosis, clubbing, rash, 1+ pitting edema right leg, trace pitting left Neuro: alert & orientedx3, cranial nerves grossly intact. Moves all 4 extremities w/o difficulty. Affect pleasant.   ECG: not done   ASSESSMENT & PLAN:  1: Chronic heart failure with preserved ejection fraction- - suspect due to COPD/ alcohol use - NYHA class II - euvolemic - weighing daily; call for an overnight weight gain of > 2 pounds or a weekly weight gain of > 5 pounds - weight stable from last visit here 3 weeks ago - Echo 08/18/21: EF 60-65% along with Grade I DD - Echo 03/18/23: EF 55-60% with mild LVH along with moderately elevated PA pressure of 49.1 mmHg and flattened interventricular septum in systole - begin farxiga 10mg  daily; 30 day voucher provided and Pharm D Rebecka Apley  PharmD) will look to see if patient qualifies for a grant - BMP next visit - has upcoming cardiology appt ([facility]) 09/24 - not adding salt but does eat TV dinners; reads food labels and is aware to keep daily sodium 2000mg  - get compression socks and put them on every morning with removal at bedtime - BNP 01/28/23 was 23.8  2: COPD- - BMP 02/08/23 reviewed and showed sodium 131, potassium 3.6, creatinine 0.79 & GFR >60 - consider pulmonology referral but he defers today  3: Anxiety/ depression- - managed by PCP - reports increased stress due to marital issues - saw PCP Talbert Forest) 07/24  4: Alcohol/ tobacco use- - smokes a little less than 1 ppd of  cigarettes - says that he quit drinking ~ 1.5 years ago but prior to stoppage, he was drinking 20 cans of beer daily - gets B12 injection and says that he also uses SL B12 daily  5: Pulmonary HTN- - Echo 03/18/23: EF 55-60% with mild LVH along with moderately elevated PA pressure of 49.1 mmHg and flattened interventricular septum in systole - discussed w/ Dr. Gala Romney who recommends RHC to assess RV strain - called patient to discuss further and he is agreeable if the access site is in his arm due to current back pain which radiates in his groin & down his legs and that it has to be done at [facility] due to transportation - patient is agreeable to starting the insurance process to get it approved  Return in 1 month, sooner if needed  Delma Freeze, FNP 02/27/23

## 2023-03-23 ENCOUNTER — Encounter: Payer: Self-pay | Admitting: Family

## 2023-03-23 NOTE — Assessment & Plan Note (Signed)
A1C Continues to be in prediabetic ranges.  Will reassess at follow up after next lab check.  Patient counseled on dietary choices and verbalized understanding.  Patient educated on foods that contain carbohydrates and the need to decrease intake.  We discussed prediabetes, and what it means and the need for strict dietary control to prevent progression to type 2 diabetes.  Advised to decrease intake of sugary drinks, including sodas, sweet tea, and some juices, and of starch and sugar heavy foods (ie., potatoes, rice, bread, pasta, desserts). He verbalizes understanding and agreement with the changes discussed today.  

## 2023-03-23 NOTE — Assessment & Plan Note (Signed)
Patient stable.  Well controlled with current therapy.   Continue current meds.  

## 2023-03-23 NOTE — Assessment & Plan Note (Signed)
Checking labs today.  Will continue supplements as needed.  

## 2023-03-23 NOTE — Assessment & Plan Note (Signed)
Checking labs today.  Continue current therapy for lipid control. Will modify as needed based on labwork results.  

## 2023-03-23 NOTE — Progress Notes (Signed)
Established Patient Office Visit  Subjective:  Patient ID: Lance House, male    DOB: February 28, 1951  Age: 72 y.o. MRN: [id number]  Chief Complaint  Patient presents with   Follow-up    1 Month Follow Up    Patient is here today for his 1 month follow up.  He has been feeling fairly well since last appointment.   He does not have additional concerns to discuss today.  Labs are due today. He needs refills.   I have reviewed his active problem list, medication list, allergies, notes from last encounter, lab results for his appointment today.    No other concerns at this time.   Past Medical History:  Diagnosis Date   Abdominal pain 04/08/2019   Anxiety    Anxiety hyperventilation 05/08/2019   Closed compression fracture of L4 vertebra (HCC) 08/18/2021   Closed fracture of distal fibula 02/13/2021   Compression fracture of thoracic vertebra (HCC) 08/18/2021   Depression    Elevated troponin    Gout    Hypokalemia 08/16/2021   Hyponatremia 08/16/2021   ICH (intracerebral hemorrhage) (HCC) 08/15/2021   Incarcerated left inguinal hernia    Injury of right ankle 02/13/2021   Left flank pain 02/03/2013   Other fatigue 10/25/2022   Pain    Pressure injury of skin 09/11/2021   Rhabdomyolysis 08/16/2021    Past Surgical History:  Procedure Laterality Date   APPENDECTOMY  1994   INGUINAL HERNIA REPAIR Left 04/13/2019   Procedure: HERNIA REPAIR INGUINAL ADULT OPEN, LEFT;  Surgeon: Duanne Guess, MD;  Location: [facility] ORS;  Service: General;  Laterality: Left;   NASAL SINUS SURGERY  2015   [facility]   VIDEO ASSISTED THORACOSCOPY (VATS)/DECORTICATION Left 09/11/2021   Procedure: VIDEO ASSISTED THORACOSCOPY (VATS)/DECORTICATION;  Surgeon: Loreli Slot, MD;  Location: [facility] OR;  Service: Thoracic;  Laterality: Left;    Social History   Socioeconomic History   Marital status: Married    Spouse name: Not on file   Number of children: Not on file   Years of education: Not on  file   Highest education level: Not on file  Occupational History   Not on file  Tobacco Use   Smoking status: Every Day    Current packs/day: 1.00    Types: Cigarettes   Smokeless tobacco: Never  Vaping Use   Vaping status: Never Used  Substance and Sexual Activity   Alcohol use: Not Currently    Alcohol/week: 42.0 standard drinks of alcohol    Types: 42 Cans of beer per week    Comment: quit around 05/2021   Drug use: Not Currently   Sexual activity: Not on file  Other Topics Concern   Not on file  Social History Narrative   Not on file   Social Determinants of Health   Financial Resource Strain: Not on file  Food Insecurity: Not on file  Transportation Needs: Not on file  Physical Activity: Not on file  Stress: Not on file  Social Connections: Not on file  Intimate Partner Violence: Not on file    Family History  Problem Relation Age of Onset   Skin cancer Father    Multiple sclerosis Sister    Non-Hodgkin's lymphoma Brother     Allergies  Allergen Reactions   Ciprocin-Fluocin-Procin [Fluocinolone] Anaphylaxis    Hip hurt   Ciprofloxacin Other (See Comments)    Unknown reaction   Colchicine Other (See Comments)   Ezetimibe Nausea And Vomiting   Meloxicam Diarrhea and Swelling  Duloxetine Diarrhea, Nausea And Vomiting and Other (See Comments)    Altered mental status    Review of Systems  All other systems reviewed and are negative.      Objective:   BP 132/70   Pulse 95   Ht 6' (1.829 m)   Wt 242 lb (109.8 kg)   SpO2 92%   BMI 32.82 kg/m   Vitals:   02/26/23 1120  BP: 132/70  Pulse: 95  Height: 6' (1.829 m)  Weight: 242 lb (109.8 kg)  SpO2: 92%  BMI (Calculated): 32.81    Physical Exam Vitals and nursing note reviewed.  Constitutional:      Appearance: Normal appearance. He is normal weight.  Eyes:     Extraocular Movements: Extraocular movements intact.     Conjunctiva/sclera: Conjunctivae normal.     Pupils: Pupils are  equal, round, and reactive to light.  Cardiovascular:     Rate and Rhythm: Normal rate and regular rhythm.     Pulses: Normal pulses.     Heart sounds: Normal heart sounds.  Pulmonary:     Effort: Pulmonary effort is normal.     Breath sounds: Normal breath sounds.  Musculoskeletal:     Lumbar back: Tenderness present. Positive right straight leg raise test and positive left straight leg raise test.  Neurological:     General: No focal deficit present.     Mental Status: He is alert and oriented to person, place, and time. Mental status is at baseline.  Psychiatric:        Mood and Affect: Mood normal.        Behavior: Behavior normal.        Thought Content: Thought content normal.        Judgment: Judgment normal.     No results found for any visits on 02/26/23.  Recent Results (from the past 2160 hour(s))  Basic metabolic panel     Status: Abnormal   Collection Time: 01/28/23  2:14 PM  Result Value Ref Range   Sodium 131 (L) 135 - 145 mmol/L   Potassium 3.9 3.5 - 5.1 mmol/L   Chloride 100 98 - 111 mmol/L   CO2 21 (L) 22 - 32 mmol/L   Glucose, Bld 124 (H) 70 - 99 mg/dL    Comment: Glucose reference range applies only to samples taken after fasting for at least 8 hours.   BUN 6 (L) 8 - 23 mg/dL   Creatinine, Ser 0.86 0.61 - 1.24 mg/dL   Calcium 8.9 8.9 - 57.8 mg/dL   GFR, Estimated >46 >96 mL/min    Comment: (NOTE) Calculated using the CKD-EPI Creatinine Equation (2021)    Anion gap 10 5 - 15    Comment: Performed at [facility], [address] [place], [place] [address]  CBC     Status: Abnormal   Collection Time: 01/28/23  2:14 PM  Result Value Ref Range   WBC 8.1 4.0 - 10.5 K/uL   RBC 5.86 (H) 4.22 - 5.81 MIL/uL   Hemoglobin 17.2 (H) 13.0 - 17.0 g/dL   HCT 41.3 24.4 - 01.0 %   MCV 88.2 80.0 - 100.0 fL   MCH 29.4 26.0 - 34.0 pg   MCHC 33.3 30.0 - 36.0 g/dL   RDW 27.2 53.6 - 64.4 %   Platelets 198 150 - 400 K/uL   nRBC 0.0 0.0 - 0.2 %    Comment:  Performed at [facility], [address], [place], [place] [address]  Troponin I (High Sensitivity)     Status: None   Collection Time: 01/28/23  2:14 PM  Result Value Ref Range   Troponin I (High Sensitivity) 4 <18 ng/L    Comment: (NOTE) Elevated high sensitivity troponin I (hsTnI) values and significant  changes across serial measurements may suggest ACS but many other  chronic and acute conditions are known to elevate hsTnI results.  Refer to the "Links" section for chest pain algorithms and additional  guidance. Performed at [facility], [address] [place], [place] [address]   Brain natriuretic peptide     Status: None   Collection Time: 01/28/23  2:16 PM  Result Value Ref Range   B Natriuretic Peptide 23.8 0.0 - 100.0 pg/mL    Comment: Performed at [facility], [address] [place], [place] [address]  Basic metabolic panel     Status: Abnormal   Collection Time: 02/08/23  2:06 PM  Result Value Ref Range   Sodium 131 (L) 135 - 145 mmol/L   Potassium 3.6 3.5 - 5.1 mmol/L   Chloride 101 98 - 111 mmol/L   CO2 21 (L) 22 - 32 mmol/L   Glucose, Bld 141 (H) 70 - 99 mg/dL    Comment: Glucose reference range applies only to samples taken after fasting for at least 8 hours.   BUN 7 (L) 8 - 23 mg/dL   Creatinine, Ser 4.69 0.61 - 1.24 mg/dL   Calcium 8.7 (L) 8.9 - 10.3 mg/dL   GFR, Estimated >62 >95 mL/min    Comment: (NOTE) Calculated using the CKD-EPI Creatinine Equation (2021)    Anion gap 9 5 - 15    Comment: Performed at [facility], [address] [place], [place] [address]  CBC     Status: Abnormal   Collection Time: 02/08/23  2:06 PM  Result Value Ref Range   WBC 7.5 4.0 - 10.5 K/uL   RBC 5.87 (H) 4.22 - 5.81 MIL/uL   Hemoglobin 17.5 (H) 13.0 - 17.0 g/dL   HCT 24.4 (H) 01.0 - 27.2 %   MCV 89.4 80.0 - 100.0 fL   MCH 29.8 26.0 - 34.0 pg   MCHC 33.3 30.0 - 36.0 g/dL   RDW 53.6 64.4 - 03.4 %   Platelets 212 150 - 400  K/uL   nRBC 0.0 0.0 - 0.2 %    Comment: Performed at [facility], [address] [place], [place] [address]  Troponin I (High Sensitivity)     Status: None   Collection Time: 02/08/23  2:06 PM  Result Value Ref Range   Troponin I (High Sensitivity) 3 <18 ng/L    Comment: (NOTE) Elevated high sensitivity troponin I (hsTnI) values and significant  changes across serial measurements may suggest ACS but many other  chronic and acute conditions are known to elevate hsTnI results.  Refer to the "Links" section for chest pain algorithms and additional  guidance. Performed at [facility], [address] [place], [place] [address]   Troponin I (High Sensitivity)     Status: None   Collection Time: 02/08/23  5:49 PM  Result Value Ref Range   Troponin I (High Sensitivity) 4 <18 ng/L    Comment: (NOTE) Elevated high sensitivity troponin I (hsTnI) values and significant  changes across serial measurements may suggest ACS but many other  chronic and acute conditions are known to elevate hsTnI results.  Refer to the "Links" section for chest pain algorithms and additional  guidance. Performed at [facility], [address] [place], [place] [address]   ECHOCARDIOGRAM COMPLETE     Status: None   Collection Time: 03/18/23 12:10 PM  Result Value Ref Range   Ao pk vel 1.91 m/s   AV Area VTI 2.04 cm2   AR max vel 2.10 cm2   AV Mean grad 8.0 mmHg   AV Peak grad 14.6 mmHg   Single Plane A2C EF 52.9 %   Single Plane A4C EF 51.7 %   Calc EF 55.0 %   S' Lateral 3.20 cm   AV Area mean vel 1.83 cm2   Area-P 1/2 3.08 cm2   MV VTI 3.13 cm2   Est EF 55 - 60%        Assessment & Plan:   Problem List Items Addressed This Visit       Active Problems   Anxiety    Patient stable.  Well controlled with current therapy.   Continue current meds.       Relevant Medications   clonazePAM (KLONOPIN) 1 MG tablet   Empyema (HCC)    Patient stable.  Well  controlled with current therapy.   Continue current meds.        Chronic obstructive pulmonary disease (HCC)    Patient stable.  Well controlled with current therapy.   Continue current meds.       Mixed hyperlipidemia    Checking labs today.  Continue current therapy for lipid control. Will modify as needed based on labwork results.       Vitamin D deficiency, unspecified    Checking labs today.  Will continue supplements as needed.       Prediabetes    A1C Continues to be in prediabetic ranges.  Will reassess at follow up after next lab check.  Patient counseled on dietary choices and verbalized understanding.  Patient educated on foods that contain carbohydrates and the need to decrease intake.  We discussed prediabetes, and what it means and the need for strict dietary control to prevent progression to type 2 diabetes.  Advised to decrease intake of sugary drinks, including sodas, sweet tea, and some juices, and of starch and sugar heavy foods (ie., potatoes, rice, bread, pasta, desserts). He verbalizes understanding and agreement with the changes discussed today.        B12 deficiency due to diet - Primary    Checking labs today.  Will continue supplements as needed.       Spinal stenosis at L4-L5 level    Patient stable.  Well controlled with current therapy.   Continue current meds.         Return in about 6 weeks (around 04/09/2023).   Total time spent: 20 minutes  Miki Kins, FNP  02/26/2023   This document may have been prepared by [facility] Voice Recognition software and as such may include unintentional dictation errors.

## 2023-04-01 ENCOUNTER — Ambulatory Visit: Payer: Medicare HMO

## 2023-04-04 ENCOUNTER — Ambulatory Visit: Payer: Medicare HMO | Admitting: Cardiology

## 2023-04-04 ENCOUNTER — Telehealth: Payer: Self-pay

## 2023-04-04 NOTE — Telephone Encounter (Signed)
Pt called to report that he is stopping farxiga because of side effects. He reports that he has been experiencing severe stomach pain, diarrhea, headaches, and dizziness since starting the medication. The last dose was yesterday. Provider, Clarisa Kindred, NP, notified.

## 2023-04-07 ENCOUNTER — Other Ambulatory Visit: Payer: Self-pay | Admitting: Family

## 2023-04-08 ENCOUNTER — Telehealth: Payer: Self-pay | Admitting: Family

## 2023-04-09 ENCOUNTER — Other Ambulatory Visit: Payer: Self-pay

## 2023-04-09 ENCOUNTER — Other Ambulatory Visit: Payer: Self-pay | Admitting: Family

## 2023-04-09 DIAGNOSIS — I5032 Chronic diastolic (congestive) heart failure: Secondary | ICD-10-CM

## 2023-04-09 MED ORDER — OXYCODONE HCL 5 MG PO TABS: 5.0000 mg | ORAL_TABLET | Freq: Three times a day (TID) | ORAL | 0 refills | Status: DC | PRN

## 2023-04-09 NOTE — Progress Notes (Signed)
My chart msg sent to pt.  Hi Lance House,    We have been trying to talk to you to go over your instructions for your Heart cath. You spoke with Clarisa Kindred , FNP on your last appointment regarding this procedure.    Please give [place] a call at [phone]     Your physician has requested that you have a cardiac catheterization. Cardiac catheterization is used to diagnose and/or treat various heart conditions. Doctors may recommend this procedure for a number of different reasons. The most common reason is to evaluate chest pain. Chest pain can be a symptom of coronary artery disease (CAD), and cardiac catheterization can show whether plaque is narrowing or blocking your heart's arteries. This procedure is also used to evaluate the valves, as well as measure the blood flow and oxygen levels in different parts of your heart. For further information please visit [email or web address] Please follow instruction sheet, as given.          You are scheduled for a Cardiac Catheterization on Friday, October 4 with Dr. Marca Ancona.   1. Please arrive at the [facility] Entrance of [facility], [address], [place] [address] at 8:00 AM (This is 1 hour(s) prior to your procedure time).  Proceed to the Check-In Desk directly inside the entrance.   Procedure Parking: Use the entrance off of the [address] [address] side of the hospital. Turn right upon entering and follow the driveway to parking that is directly in front of the [facility]. There is no valet parking available at this entrance, however there is an awning directly in front of the [facility] Center for drop off/ pick up for patients.   Special note: Every effort is made to have your procedure done on time. Please understand that emergencies sometimes delay scheduled procedures.   2. Diet: Do not eat solid foods after midnight.  The patient may have clear liquids until 5am upon the day of the procedure.     4.  Medication instructions in preparation for your procedure:    Contrast Allergy: No   Please HOLD YOUR FARXIGA ON 04/23/23. Then resume after your procedure.     5. Plan to go home the same day, you will only stay overnight if medically necessary. 6. Bring a current list of your medications and current insurance cards. 7. You MUST have a responsible person to drive you home. 8. Someone MUST be with you the first 24 hours after you arrive home or your discharge will be delayed. 9. Please wear clothes that are easy to get on and off and wear slip-on shoes.   Thank you for allowing [place] to care for you!   -- [facility]

## 2023-04-09 NOTE — Addendum Note (Signed)
Addended by: Electa Sniff on: 04/09/2023 03:44 PM   Modules accepted: Orders

## 2023-04-10 ENCOUNTER — Other Ambulatory Visit (HOSPITAL_COMMUNITY): Payer: Self-pay

## 2023-04-10 ENCOUNTER — Ambulatory Visit: Payer: Medicare HMO | Admitting: Family

## 2023-04-10 NOTE — Telephone Encounter (Signed)
Error

## 2023-04-16 ENCOUNTER — Ambulatory Visit: Payer: Medicare HMO | Admitting: Family

## 2023-04-19 NOTE — Progress Notes (Unsigned)
PCP: Miki Kins, FNP (last seen 07/24) Cardiologist: Debbe Odea, MD (to see 11/24)  HPI:  Mr Earnest is a 72 y/o male with a history of hyperlipidemia, COPD, intracranial hemorrhage, alcohol abuse, gout, anxiety and chronic heart failure (newly diagnosed 07/24).   Was in the ED 01/28/23 due to bilateral leg swelling over the last several days, worse on the right along with increased SOB. Ultrasound negative for DVT. IV lasix provided. Was in the ED 02/08/23 due to chest pain and SOB. Was given oral lasix 11 days prior but still was SOB. Troponin normal. IV lasix provided. Chest CTA negative for PE.    Echo 08/18/21: EF 60-65% along with Grade I DD Echo 03/18/23: EF 55-60% with mild LVH along with moderately elevated PA pressure of 49.1 mmHg and flattened interventricular septum in systole  He presents today for a HF f/u visit with a chief complaint of minimal fatigue with moderate exertion. Chronic in nature. Has associated pedal edema along with this. Denies shortness of breath, chest pain, cough, palpitations, abdominal distention, dizziness, difficulty sleeping or weight gain.   At last visit farxiga was started but he was unable to tolerate it due to GI side effects/ headache/ weakness so he stopped taking it. He says that after about a week, the symptoms all resolved.    Doesn't add salt but does eat TV dinners but tries to pick the lowest sodium choices. Is aware to keep his daily intake under 200mg . Continues to smoke but says that it's less than 1 ppd cigarettes.   To have RHC 04/26/23 and he reports anxiety when thinking about the test. Says that he has taken 10mg  valium before when he has pain management procedures done.   ROS: All systems negative except as listed in HPI, PMH and Problem List.  SH:  Social History   Socioeconomic History   Marital status: Married    Spouse name: Not on file   Number of children: Not on file   Years of education: Not on file   Highest  education level: Not on file  Occupational History   Not on file  Tobacco Use   Smoking status: Every Day    Current packs/day: 1.00    Types: Cigarettes   Smokeless tobacco: Never  Vaping Use   Vaping status: Never Used  Substance and Sexual Activity   Alcohol use: Not Currently    Alcohol/week: 42.0 standard drinks of alcohol    Types: 42 Cans of beer per week    Comment: quit around 05/2021   Drug use: Not Currently   Sexual activity: Not on file  Other Topics Concern   Not on file  Social History Narrative   Not on file   Social Determinants of Health   Financial Resource Strain: Not on file  Food Insecurity: Not on file  Transportation Needs: Not on file  Physical Activity: Not on file  Stress: Not on file  Social Connections: Not on file  Intimate Partner Violence: Not on file    FH:  Family History  Problem Relation Age of Onset   Skin cancer Father    Multiple sclerosis Sister    Non-Hodgkin's lymphoma Brother     Past Medical History:  Diagnosis Date   Abdominal pain 04/08/2019   Anxiety    Anxiety hyperventilation 05/08/2019   Closed compression fracture of L4 vertebra (HCC) 08/18/2021   Closed fracture of distal fibula 02/13/2021   Compression fracture of thoracic vertebra (HCC) 08/18/2021  Depression    Elevated troponin    Gout    Hypokalemia 08/16/2021   Hyponatremia 08/16/2021   ICH (intracerebral hemorrhage) (HCC) 08/15/2021   Incarcerated left inguinal hernia    Injury of right ankle 02/13/2021   Left flank pain 02/03/2013   Other fatigue 10/25/2022   Pain    Pressure injury of skin 09/11/2021   Rhabdomyolysis 08/16/2021    Current Outpatient Medications  Medication Sig Dispense Refill   Cholecalciferol (VITAMIN D3) 125 MCG (5000 UT) TABS Take 1 tablet by mouth daily.     clonazePAM (KLONOPIN) 1 MG tablet Take 1 tablet (1 mg total) by mouth 2 (two) times daily. 60 tablet 2   FARXIGA 10 MG TABS tablet Take 1 tablet (10 mg total) by  mouth daily before breakfast. 90 tablet 3   folic acid (FOLVITE) 1 MG tablet TAKE 1 TABLET BY MOUTH EVERY DAY 90 tablet 1   gabapentin (NEURONTIN) 400 MG capsule TAKE 1 CAPSULE BY MOUTH THREE TIMES DAILY 90 capsule 0   hydrOXYzine (ATARAX) 25 MG tablet TAKE 1 TABLET BY MOUTH UP TO THREE TIMES DAILY AS NEEDED FOR ITCHING 90 tablet 3   Methylcobalamin (B12 SL) Place 5,000 mcg under the tongue daily. Patient doesn't know dosage     oxyCODONE (OXY IR/ROXICODONE) 5 MG immediate release tablet Take 1 tablet (5 mg total) by mouth 3 (three) times daily as needed for severe pain. 90 tablet 0   pyridOXINE (VITAMIN B6) 100 MG tablet Take 100 mg by mouth daily.     No current facility-administered medications for this visit.   Vitals:   04/22/23 1114  BP: 118/80  Pulse: 88  Resp: 14  SpO2: 94%  Weight: 242 lb 6 oz (109.9 kg)   Wt Readings from Last 3 Encounters:  04/22/23 242 lb 6 oz (109.9 kg)  03/21/23 248 lb (112.5 kg)  02/28/23 247 lb (112 kg)   Lab Results  Component Value Date   CREATININE 0.79 02/08/2023   CREATININE 0.76 01/28/2023   CREATININE 0.74 (L) 10/22/2022   PHYSICAL EXAM:  General:  Well appearing. No resp difficulty HEENT: normal Neck: supple. JVP flat. No lymphadenopathy or thryomegaly appreciated. Cor: PMI normal. Regular rate & rhythm. No rubs, gallops or murmurs. Lungs: clear Abdomen: soft, nontender, nondistended. No hepatosplenomegaly. Extremities: no cyanosis, clubbing, rash, trace pitting edema bilateral lower legs Neuro: alert & orientedx3, cranial nerves grossly intact. Moves all 4 extremities w/o difficulty. Affect pleasant.   ECG: NSR HR 78 (personally reviewed)   ASSESSMENT & PLAN:  1: Chronic heart failure with preserved ejection fraction- - suspect due to COPD/ alcohol use - NYHA class II - euvolemic - weighing daily; call for an overnight weight gain of > 2 pounds or a weekly weight gain of > 5 pounds - weight down 6 pounds from last visit here  1 month ago - Echo 08/18/21: EF 60-65% along with Grade I DD - Echo 03/18/23: EF 55-60% with mild LVH along with moderately elevated PA pressure of 49.1 mmHg and flattened interventricular septum in systole - had GI side effects/ HA/ weakness with farxiga so he stopped taking it; this has now been added as an allergy - currently not interested in other medications due to multiple medication intolerances - has cardiology appt ([facility]) 11/24 - not adding salt but does eat TV dinners; reads food labels and is aware to keep daily sodium 2000mg  - BNP 01/28/23 was 23.8  2: COPD- - BMP 02/08/23 reviewed and showed sodium 131,  potassium 3.6, creatinine 0.79 & GFR >60 - consider pulmonology referral but he defers today  3: Anxiety/ depression- - managed by PCP - reports continued stress due to marital issues - saw PCP Talbert Forest) 07/24  4: Alcohol/ tobacco use- - smokes a little less than 1 ppd of cigarettes - discussed health coach and he says that he will think about it - says that he quit drinking ~ 1.5 years ago (~ early 2023) but prior to stoppage, he was drinking 20 cans of beer daily - gets B12 injection and says that he also uses SL B12 daily  5: Pulmonary HTN- - Echo 03/18/23: EF 55-60% with mild LVH along with moderately elevated PA pressure of 49.1 mmHg and flattened interventricular septum in systole - RHC scheduled for 04/26/23 w/ followup with Dr. Shirlee Latch on 05/06/23 - after talking w/ Dr. Shirlee Latch about patient's anxiety, offered to send in 2 mg Valium for the night prior; patient declined as he says "that dose won't do anything"; explained that he can let the staff aware of his anxiety when he arrives for the RHC and see if they can provide anything else at that time - EKG, BMP, CBC today as pre-cath work-up  He will return in 2 weeks, sooner if needed.

## 2023-04-19 NOTE — H&P (View-Only) (Signed)
PCP: Lance Kins, FNP (last seen 07/24) Cardiologist: Lance Odea, MD (to see 11/24)  HPI:  [name] is a 72 y/o male with a history of hyperlipidemia, COPD, intracranial hemorrhage, alcohol abuse, gout, anxiety and chronic heart failure (newly diagnosed 07/24).   Was in the ED 01/28/23 due to bilateral leg swelling over the last several days, worse on the right along with increased SOB. Ultrasound negative for DVT. IV lasix provided. Was in the ED 02/08/23 due to chest pain and SOB. Was given oral lasix 11 days prior but still was SOB. Troponin normal. IV lasix provided. Chest CTA negative for PE.    Echo 08/18/21: EF 60-65% along with Grade I DD Echo 03/18/23: EF 55-60% with mild LVH along with moderately elevated PA pressure of 49.1 mmHg and flattened interventricular septum in systole  He presents today for a HF f/u visit with a chief complaint of minimal fatigue with moderate exertion. Chronic in nature. Has associated pedal edema along with this. Denies shortness of breath, chest pain, cough, palpitations, abdominal distention, dizziness, difficulty sleeping or weight gain.   At last visit farxiga was started but he was unable to tolerate it due to GI side effects/ headache/ weakness so he stopped taking it. He says that after about a week, the symptoms all resolved.    Doesn't add salt but does eat TV dinners but tries to pick the lowest sodium choices. Is aware to keep his daily intake under 200mg . Continues to smoke but says that it's less than 1 ppd cigarettes.   To have RHC 04/26/23 and he reports anxiety when thinking about the test. Says that he has taken 10mg  valium before when he has pain management procedures done.   ROS: All systems negative except as listed in HPI, PMH and Problem List.  SH:  Social History   Socioeconomic History   Marital status: Married    Spouse name: Not on file   Number of children: Not on file   Years of education: Not on file   Highest  education level: Not on file  Occupational History   Not on file  Tobacco Use   Smoking status: Every Day    Current packs/day: 1.00    Types: Cigarettes   Smokeless tobacco: Never  Vaping Use   Vaping status: Never Used  Substance and Sexual Activity   Alcohol use: Not Currently    Alcohol/week: 42.0 standard drinks of alcohol    Types: 42 Cans of beer per week    Comment: quit around 05/2021   Drug use: Not Currently   Sexual activity: Not on file  Other Topics Concern   Not on file  Social History Narrative   Not on file   Social Determinants of Health   Financial Resource Strain: Not on file  Food Insecurity: Not on file  Transportation Needs: Not on file  Physical Activity: Not on file  Stress: Not on file  Social Connections: Not on file  Intimate Partner Violence: Not on file    FH:  Family History  Problem Relation Age of Onset   Skin cancer Father    Multiple sclerosis Sister    Non-Hodgkin's lymphoma Brother     Past Medical History:  Diagnosis Date   Abdominal pain 04/08/2019   Anxiety    Anxiety hyperventilation 05/08/2019   Closed compression fracture of L4 vertebra (HCC) 08/18/2021   Closed fracture of distal fibula 02/13/2021   Compression fracture of thoracic vertebra (HCC) 08/18/2021  Depression    Elevated troponin    Gout    Hypokalemia 08/16/2021   Hyponatremia 08/16/2021   ICH (intracerebral hemorrhage) (HCC) 08/15/2021   Incarcerated left inguinal hernia    Injury of right ankle 02/13/2021   Left flank pain 02/03/2013   Other fatigue 10/25/2022   Pain    Pressure injury of skin 09/11/2021   Rhabdomyolysis 08/16/2021    Current Outpatient Medications  Medication Sig Dispense Refill   Cholecalciferol (VITAMIN D3) 125 MCG (5000 UT) TABS Take 1 tablet by mouth daily.     clonazePAM (KLONOPIN) 1 MG tablet Take 1 tablet (1 mg total) by mouth 2 (two) times daily. 60 tablet 2   FARXIGA 10 MG TABS tablet Take 1 tablet (10 mg total) by  mouth daily before breakfast. 90 tablet 3   folic acid (FOLVITE) 1 MG tablet TAKE 1 TABLET BY MOUTH EVERY DAY 90 tablet 1   gabapentin (NEURONTIN) 400 MG capsule TAKE 1 CAPSULE BY MOUTH THREE TIMES DAILY 90 capsule 0   hydrOXYzine (ATARAX) 25 MG tablet TAKE 1 TABLET BY MOUTH UP TO THREE TIMES DAILY AS NEEDED FOR ITCHING 90 tablet 3   Methylcobalamin (B12 SL) Place 5,000 mcg under the tongue daily. Patient doesn't know dosage     oxyCODONE (OXY IR/ROXICODONE) 5 MG immediate release tablet Take 1 tablet (5 mg total) by mouth 3 (three) times daily as needed for severe pain. 90 tablet 0   pyridOXINE (VITAMIN B6) 100 MG tablet Take 100 mg by mouth daily.     No current facility-administered medications for this visit.   Vitals:   04/22/23 1114  BP: 118/80  Pulse: 88  Resp: 14  SpO2: 94%  Weight: 242 lb 6 oz (109.9 kg)   Wt Readings from Last 3 Encounters:  04/22/23 242 lb 6 oz (109.9 kg)  03/21/23 248 lb (112.5 kg)  02/28/23 247 lb (112 kg)   Lab Results  Component Value Date   CREATININE 0.79 02/08/2023   CREATININE 0.76 01/28/2023   CREATININE 0.74 (L) 10/22/2022   PHYSICAL EXAM:  General:  Well appearing. No resp difficulty HEENT: normal Neck: supple. JVP flat. No lymphadenopathy or thryomegaly appreciated. Cor: PMI normal. Regular rate & rhythm. No rubs, gallops or murmurs. Lungs: clear Abdomen: soft, nontender, nondistended. No hepatosplenomegaly. Extremities: no cyanosis, clubbing, rash, trace pitting edema bilateral lower legs Neuro: alert & orientedx3, cranial nerves grossly intact. Moves all 4 extremities w/o difficulty. Affect pleasant.   ECG: NSR HR 78 (personally reviewed)   ASSESSMENT & PLAN:  1: Chronic heart failure with preserved ejection fraction- - suspect due to COPD/ alcohol use - NYHA class II - euvolemic - weighing daily; call for an overnight weight gain of > 2 pounds or a weekly weight gain of > 5 pounds - weight down 6 pounds from last visit here  1 month ago - Echo 08/18/21: EF 60-65% along with Grade I DD - Echo 03/18/23: EF 55-60% with mild LVH along with moderately elevated PA pressure of 49.1 mmHg and flattened interventricular septum in systole - had GI side effects/ HA/ weakness with farxiga so he stopped taking it; this has now been added as an allergy - currently not interested in other medications due to multiple medication intolerances - has cardiology appt (Lance House) 11/24 - not adding salt but does eat TV dinners; reads food labels and is aware to keep daily sodium 2000mg  - BNP 01/28/23 was 23.8  2: COPD- - BMP 02/08/23 reviewed and showed sodium 131,  potassium 3.6, creatinine 0.79 & GFR >60 - consider pulmonology referral but he defers today  3: Anxiety/ depression- - managed by PCP - reports continued stress due to marital issues - saw PCP Lance House) 07/24  4: Alcohol/ tobacco use- - smokes a little less than 1 ppd of cigarettes - discussed health coach and he says that he will think about it - says that he quit drinking ~ 1.5 years ago (~ early 2023) but prior to stoppage, he was drinking 20 cans of beer daily - gets B12 injection and says that he also uses SL B12 daily  5: Pulmonary HTN- - Echo 03/18/23: EF 55-60% with mild LVH along with moderately elevated PA pressure of 49.1 mmHg and flattened interventricular septum in systole - RHC scheduled for 04/26/23 w/ followup with Dr. Shirlee House on 05/06/23 - after talking w/ Dr. Shirlee House about patient's anxiety, offered to send in 2 mg Valium for the night prior; patient declined as he says "that dose won't do anything"; explained that he can let the staff aware of his anxiety when he arrives for the RHC and see if they can provide anything else at that time - EKG, BMP, CBC today as pre-cath work-up  He will return in 2 weeks, sooner if needed.

## 2023-04-22 ENCOUNTER — Encounter: Payer: Self-pay | Admitting: Family

## 2023-04-22 ENCOUNTER — Ambulatory Visit: Payer: Medicare HMO | Attending: Family | Admitting: Family

## 2023-04-22 VITALS — BP 118/80 | HR 88 | Resp 14 | Wt 242.4 lb

## 2023-04-22 DIAGNOSIS — Z789 Other specified health status: Secondary | ICD-10-CM | POA: Diagnosis not present

## 2023-04-22 DIAGNOSIS — F418 Other specified anxiety disorders: Secondary | ICD-10-CM | POA: Diagnosis not present

## 2023-04-22 DIAGNOSIS — J449 Chronic obstructive pulmonary disease, unspecified: Secondary | ICD-10-CM | POA: Diagnosis not present

## 2023-04-22 DIAGNOSIS — F17210 Nicotine dependence, cigarettes, uncomplicated: Secondary | ICD-10-CM | POA: Diagnosis not present

## 2023-04-22 DIAGNOSIS — I5032 Chronic diastolic (congestive) heart failure: Secondary | ICD-10-CM | POA: Insufficient documentation

## 2023-04-22 DIAGNOSIS — F419 Anxiety disorder, unspecified: Secondary | ICD-10-CM | POA: Diagnosis not present

## 2023-04-22 DIAGNOSIS — I272 Pulmonary hypertension, unspecified: Secondary | ICD-10-CM | POA: Diagnosis not present

## 2023-04-22 DIAGNOSIS — F1010 Alcohol abuse, uncomplicated: Secondary | ICD-10-CM | POA: Insufficient documentation

## 2023-04-22 NOTE — Patient Instructions (Signed)
Your physician has requested that you have a cardiac catheterization. Cardiac catheterization is used to diagnose and/or treat various heart conditions. Doctors may recommend this procedure for a number of different reasons. The most common reason is to evaluate chest pain. Chest pain can be a symptom of coronary artery disease (CAD), and cardiac catheterization can show whether plaque is narrowing or blocking your heart's arteries. This procedure is also used to evaluate the valves, as well as measure the blood flow and oxygen levels in different parts of your heart. For further information please visit [email or web address] Please follow instruction sheet, as given.          You are scheduled for a Cardiac Catheterization on Friday, October 4 with Dr. Marca Ancona.   1. Please arrive at the [facility] Entrance of [facility], [address], [place] [address] at 8:00 AM (This is 1 hour(s) prior to your procedure time).  Proceed to the Check-In Desk directly inside the entrance.   Procedure Parking: Use the entrance off of the [address] side of the hospital. Turn right upon entering and follow the driveway to parking that is directly in front of the [facility]. There is no valet parking available at this entrance, however there is an awning directly in front of the [facility] for drop off/ pick up for patients.   Special note: Every effort is made to have your procedure done on time. Please understand that emergencies sometimes delay scheduled procedures.   2. Diet: Do not eat solid foods after midnight.  The patient may have clear liquids until 5am upon the day of the procedure.     4. Medication instructions in preparation for your procedure:    Contrast Allergy: No   Please HOLD YOUR FARXIGA ON 04/23/23. Then resume after your procedure.     5. Plan to go home the same day, you will only stay overnight if medically necessary. 6. Bring a current  list of your medications and current insurance cards. 7. You MUST have a responsible person to drive you home. 8. Someone MUST be with you the first 24 hours after you arrive home or your discharge will be delayed. 9. Please wear clothes that are easy to get on and off and wear slip-on shoes.   Thank you for allowing [place] to care for you!   -- [facility]

## 2023-04-23 LAB — BASIC METABOLIC PANEL
BUN/Creatinine Ratio: 7 — ABNORMAL LOW (ref 10–24)
BUN: 6 mg/dL — ABNORMAL LOW (ref 8–27)
CO2: 22 mmol/L (ref 20–29)
Calcium: 9 mg/dL (ref 8.6–10.2)
Chloride: 100 mmol/L (ref 96–106)
Creatinine, Ser: 0.82 mg/dL (ref 0.76–1.27)
Glucose: 89 mg/dL (ref 70–99)
Potassium: 4.5 mmol/L (ref 3.5–5.2)
Sodium: 137 mmol/L (ref 134–144)
eGFR: 94 mL/min/{1.73_m2} (ref >59)

## 2023-04-23 LAB — CBC
Hematocrit: 53.2 % — ABNORMAL HIGH (ref 37.5–51.0)
Hemoglobin: 17.7 g/dL (ref 13.0–17.7)
MCH: 30.2 pg (ref 26.6–33.0)
MCHC: 33.3 g/dL (ref 31.5–35.7)
MCV: 91 fL (ref 79–97)
Platelets: 187 10*3/uL (ref 150–450)
RBC: 5.87 x10E6/uL — ABNORMAL HIGH (ref 4.14–5.80)
RDW: 13.7 % (ref 11.6–15.4)
WBC: 6.6 10*3/uL (ref 3.4–10.8)

## 2023-04-24 ENCOUNTER — Other Ambulatory Visit: Payer: Self-pay

## 2023-04-24 ENCOUNTER — Telehealth: Payer: Self-pay

## 2023-04-24 MED ORDER — DIAZEPAM 2 MG PO TABS: ORAL_TABLET | ORAL | 0 refills | Status: DC

## 2023-04-24 NOTE — Telephone Encounter (Signed)
Pt called stating he needs a medication to help him sleep the night before the procedure (cath) on 04/26/23. Per Inetta Fermo, pt can get a 1 time dosage of Valium 2 MG. Rx sent to [facility] pharmacy per pt request.

## 2023-04-25 ENCOUNTER — Other Ambulatory Visit: Payer: Self-pay | Admitting: Family

## 2023-04-25 ENCOUNTER — Other Ambulatory Visit: Payer: Self-pay

## 2023-04-25 MED ORDER — DIAZEPAM 2 MG PO TABS
2.0000 mg | ORAL_TABLET | Freq: Every day | ORAL | 0 refills | Status: DC
  Filled 2023-04-25: qty 1, 1d supply, fill #0

## 2023-04-26 ENCOUNTER — Other Ambulatory Visit: Payer: Self-pay

## 2023-04-26 ENCOUNTER — Encounter: Payer: Self-pay | Admitting: Cardiology

## 2023-04-26 ENCOUNTER — Encounter
Admission: RE | Disposition: A | Discharge status: Home / Self Care | Payer: Self-pay | Source: Home / Self Care | Attending: Cardiology

## 2023-04-26 ENCOUNTER — Ambulatory Visit
Admission: RE | Admit: 2023-04-26 | Discharge: 2023-04-26 | Disposition: A | Discharge status: Home / Self Care | Payer: Medicare HMO | Attending: Cardiology | Admitting: Cardiology

## 2023-04-26 DIAGNOSIS — I509 Heart failure, unspecified: Secondary | ICD-10-CM

## 2023-04-26 DIAGNOSIS — I5032 Chronic diastolic (congestive) heart failure: Secondary | ICD-10-CM | POA: Insufficient documentation

## 2023-04-26 DIAGNOSIS — M109 Gout, unspecified: Secondary | ICD-10-CM | POA: Insufficient documentation

## 2023-04-26 DIAGNOSIS — F17210 Nicotine dependence, cigarettes, uncomplicated: Secondary | ICD-10-CM | POA: Insufficient documentation

## 2023-04-26 DIAGNOSIS — I272 Pulmonary hypertension, unspecified: Secondary | ICD-10-CM | POA: Insufficient documentation

## 2023-04-26 DIAGNOSIS — F32A Depression, unspecified: Secondary | ICD-10-CM | POA: Diagnosis not present

## 2023-04-26 DIAGNOSIS — F419 Anxiety disorder, unspecified: Secondary | ICD-10-CM | POA: Diagnosis not present

## 2023-04-26 DIAGNOSIS — I110 Hypertensive heart disease with heart failure: Secondary | ICD-10-CM | POA: Diagnosis not present

## 2023-04-26 DIAGNOSIS — F1090 Alcohol use, unspecified, uncomplicated: Secondary | ICD-10-CM | POA: Insufficient documentation

## 2023-04-26 DIAGNOSIS — Z79899 Other long term (current) drug therapy: Secondary | ICD-10-CM | POA: Insufficient documentation

## 2023-04-26 DIAGNOSIS — J449 Chronic obstructive pulmonary disease, unspecified: Secondary | ICD-10-CM | POA: Insufficient documentation

## 2023-04-26 DIAGNOSIS — E785 Hyperlipidemia, unspecified: Secondary | ICD-10-CM | POA: Insufficient documentation

## 2023-04-26 HISTORY — PX: RIGHT HEART CATH: CATH118263

## 2023-04-26 LAB — POCT I-STAT EG7
Acid-Base Excess: 0 mmol/L (ref 0.0–2.0)
Acid-Base Excess: 1 mmol/L (ref 0.0–2.0)
Bicarbonate: 27.6 mmol/L (ref 20.0–28.0)
Bicarbonate: 28.1 mmol/L — ABNORMAL HIGH (ref 20.0–28.0)
Calcium, Ion: 1.18 mmol/L (ref 1.15–1.40)
Calcium, Ion: 1.21 mmol/L (ref 1.15–1.40)
HCT: 51 % (ref 39.0–52.0)
HCT: 51 % (ref 39.0–52.0)
Hemoglobin: 17.3 g/dL — ABNORMAL HIGH (ref 13.0–17.0)
Hemoglobin: 17.3 g/dL — ABNORMAL HIGH (ref 13.0–17.0)
O2 Saturation: 65 %
O2 Saturation: 65 %
Potassium: 4 mmol/L (ref 3.5–5.1)
Potassium: 4.1 mmol/L (ref 3.5–5.1)
Sodium: 137 mmol/L (ref 135–145)
Sodium: 136 mmol/L (ref 135–145)
TCO2: 29 mmol/L (ref 22–32)
TCO2: 30 mmol/L (ref 22–32)
pCO2, Ven: 52 mm[Hg] (ref 44–60)
pCO2, Ven: 52.5 mm[Hg] (ref 44–60)
pH, Ven: 7.333 (ref 7.25–7.43)
pH, Ven: 7.337 (ref 7.25–7.43)
pO2, Ven: 36 mm[Hg] (ref 32–45)
pO2, Ven: 37 mm[Hg] (ref 32–45)

## 2023-04-26 SURGERY — RIGHT HEART CATH
Anesthesia: Moderate Sedation | Laterality: Right

## 2023-04-26 MED ORDER — SODIUM CHLORIDE 0.9% FLUSH: 3.0000 mL | INTRAVENOUS | Status: DC | PRN

## 2023-04-26 MED ORDER — MIDAZOLAM HCL 2 MG/2ML IJ SOLN
INTRAMUSCULAR | Status: AC
  Filled 2023-04-26: qty 2

## 2023-04-26 MED ORDER — HEPARIN (PORCINE) IN NACL 1000-0.9 UT/500ML-% IV SOLN
INTRAVENOUS | Status: DC | PRN
  Administered 2023-04-26: 1000 mL

## 2023-04-26 MED ORDER — LABETALOL HCL 5 MG/ML IV SOLN: 10.0000 mg | INTRAVENOUS | Status: DC | PRN

## 2023-04-26 MED ORDER — SODIUM CHLORIDE 0.9% FLUSH: 3.0000 mL | Freq: Two times a day (BID) | INTRAVENOUS | Status: DC

## 2023-04-26 MED ORDER — SODIUM CHLORIDE 0.9 % IV SOLN: INTRAVENOUS | Status: DC

## 2023-04-26 MED ORDER — FENTANYL CITRATE (PF) 100 MCG/2ML IJ SOLN
INTRAMUSCULAR | Status: AC
  Filled 2023-04-26: qty 2

## 2023-04-26 MED ORDER — MIDAZOLAM HCL 2 MG/2ML IJ SOLN
INTRAMUSCULAR | Status: DC | PRN
  Administered 2023-04-26: 1 mg via INTRAVENOUS

## 2023-04-26 MED ORDER — HYDRALAZINE HCL 20 MG/ML IJ SOLN: 10.0000 mg | INTRAMUSCULAR | Status: DC | PRN

## 2023-04-26 MED ORDER — LIDOCAINE HCL (PF) 1 % IJ SOLN
INTRAMUSCULAR | Status: DC | PRN
  Administered 2023-04-26: 2 mL

## 2023-04-26 MED ORDER — FENTANYL CITRATE (PF) 100 MCG/2ML IJ SOLN
INTRAMUSCULAR | Status: DC | PRN
  Administered 2023-04-26 (×2): 25 ug via INTRAVENOUS

## 2023-04-26 MED ORDER — ACETAMINOPHEN 325 MG PO TABS: 650.0000 mg | ORAL_TABLET | ORAL | Status: DC | PRN

## 2023-04-26 MED ORDER — SODIUM CHLORIDE 0.9 % IV SOLN: 250.0000 mL | INTRAVENOUS | Status: DC | PRN

## 2023-04-26 MED ORDER — ONDANSETRON HCL 4 MG/2ML IJ SOLN: 4.0000 mg | Freq: Four times a day (QID) | INTRAMUSCULAR | Status: DC | PRN

## 2023-04-26 SURGICAL SUPPLY — 7 items
CATH BALLN WEDGE 5F 110CM (CATHETERS) IMPLANT
DRAPE BRACHIAL (DRAPES) IMPLANT
PACK CARDIAC CATH (CUSTOM PROCEDURE TRAY) IMPLANT
PROTECTION STATION PRESSURIZED (MISCELLANEOUS) ×1
SET ATX-X65L (MISCELLANEOUS) IMPLANT
SHEATH GLIDE SLENDER 4/5FR (SHEATH) IMPLANT
STATION PROTECTION PRESSURIZED (MISCELLANEOUS) IMPLANT

## 2023-04-26 NOTE — Discharge Instructions (Signed)
Right Heart Cath, Care After This sheet gives you information about how to care for yourself after your procedure. Your health care provider may also give you more specific instructions. If you have problems or questions, contact your health care provider. What can I expect after the procedure? After the procedure, it is common to have: Bruising or mild discomfort in the area where the IV was inserted (insertion site). Follow these instructions at home: Eating and drinking  You may eat and drink after your procedure.  Drink a lot of fluids for the first several days after the procedure, as directed by your health care provider. This helps to wash (flush) the contrast out of your body. Examples of healthy fluids include water or low-calorie drinks. General instructions Check your IV insertion area and also your venous access site every day for signs of infection. Check for: Redness, swelling, or pain. Fluid or blood. Warmth. Pus or a bad smell. Take over-the-counter and prescription medicines only as told by your health care provider. Rest and return to your normal activities as told by your health care provider. Ask your health care provider what activities are safe for you. Do not drive for 24 hours if you were given a medicine to help you relax (sedative), or until your health care provider approves. Keep all follow-up visits as told by your health care provider. This is important. Contact a health care provider if: Your skin becomes itchy or you develop a rash or hives. You have a fever that does not get better with medicine. You feel nauseous. You vomit. You have redness, swelling, or pain around the insertion site. You have fluid or blood coming from the insertion site. Your insertion area feels warm to the touch. You have pus or a bad smell coming from the insertion site. Get help right away if: You have difficulty breathing or shortness of breath. You develop chest pain. You  faint. You feel very dizzy. These symptoms may represent a serious problem that is an emergency. Do not wait to see if the symptoms will go away. Get medical help right away. Call your local emergency services (911 in the [place] Do not drive yourself to the hospital. Summary After your procedure, it is common to have bruising or mild discomfort in the area where the IV was inserted. You should check your IV insertion area every day for signs of infection. Take over-the-counter and prescription medicines only as told by your health care provider. You should drink a lot of fluids for the first several days after the procedure to help flush the contrast from your body. This information is not intended to replace advice given to you by your health care provider. Make sure you discuss any questions you have with your health care provider. Document Released: 04/29/2013 Document Revised: 06/21/2017 Document Reviewed: 06/02/2016 Elsevier Patient Education  2020 [employer] 

## 2023-04-26 NOTE — Interval H&P Note (Signed)
History and Physical Interval Note:  04/26/2023 8:15 AM  Lance House  has presented today for surgery, with the diagnosis of R Cath    HF.  The various methods of treatment have been discussed with the patient and family. After consideration of risks, benefits and other options for treatment, the patient has consented to  Procedure(s): RIGHT HEART CATH (Right) as a surgical intervention.  The patient's history has been reviewed, patient examined, no change in status, stable for surgery.  I have reviewed the patient's chart and labs.  Questions were answered to the patient's satisfaction.     [name] [employer]

## 2023-04-29 ENCOUNTER — Telehealth: Payer: Self-pay | Admitting: Family

## 2023-04-29 ENCOUNTER — Encounter: Payer: Self-pay | Admitting: Cardiology

## 2023-04-29 NOTE — Telephone Encounter (Signed)
Patient called in stating that when he had a heart cath on Friday they gave him Fentanyl and it "messed with his mind" and he ended up throwing away ALL his medications. He says he doesn't even have his folic acid. He is wanting [place] to send in all of his meds. I advised him that insurance will not pay if it is not time to fill the medication and that some of his meds are controlled substances and were not allowed to send them in early. Patient went on to say if we call the pharmacy and give them the okay that they will fill them early. I told him how last time I did that for him per Marchelle Folks that the pharmacy said this is a habit for him always filling his clonazepam early. Patient kind of got irritated with me and said that he'll just be fine until he sees [place] next Tuesday. Advised him that I would let Marchelle Folks know what's going on.

## 2023-04-29 NOTE — Telephone Encounter (Signed)
Now patient has called back and left a VM stating he got all his meds back. He says his neighbor had them - but earlier when I asked if he knew where he "threw them away" at he told me "yes, over the fence in the field". I asked if he could not get them and he said "the grass is too tall I can't find them". Now he is saying he gave them to the neighbor on Friday.

## 2023-05-04 ENCOUNTER — Other Ambulatory Visit: Payer: Self-pay | Admitting: Family

## 2023-05-06 ENCOUNTER — Other Ambulatory Visit: Payer: Self-pay | Admitting: Family

## 2023-05-06 ENCOUNTER — Encounter: Payer: Medicare HMO | Admitting: Cardiology

## 2023-05-07 ENCOUNTER — Telehealth: Payer: Self-pay | Admitting: Family

## 2023-05-07 ENCOUNTER — Other Ambulatory Visit: Payer: Self-pay

## 2023-05-07 ENCOUNTER — Ambulatory Visit: Payer: Medicare HMO | Admitting: Family

## 2023-05-08 ENCOUNTER — Other Ambulatory Visit: Payer: Self-pay

## 2023-05-08 MED ORDER — OXYCODONE HCL 5 MG PO TABS: 5.0000 mg | ORAL_TABLET | Freq: Three times a day (TID) | ORAL | 0 refills | Status: DC | PRN

## 2023-05-14 ENCOUNTER — Ambulatory Visit (INDEPENDENT_AMBULATORY_CARE_PROVIDER_SITE_OTHER): Payer: Medicare HMO | Admitting: Family

## 2023-05-14 VITALS — BP 130/65 | HR 103 | Ht 73.0 in | Wt 243.2 lb

## 2023-05-14 DIAGNOSIS — M5442 Lumbago with sciatica, left side: Secondary | ICD-10-CM | POA: Diagnosis not present

## 2023-05-14 DIAGNOSIS — E782 Mixed hyperlipidemia: Secondary | ICD-10-CM | POA: Diagnosis not present

## 2023-05-14 DIAGNOSIS — E538 Deficiency of other specified B group vitamins: Secondary | ICD-10-CM

## 2023-05-14 DIAGNOSIS — G8929 Other chronic pain: Secondary | ICD-10-CM | POA: Diagnosis not present

## 2023-05-14 DIAGNOSIS — J449 Chronic obstructive pulmonary disease, unspecified: Secondary | ICD-10-CM | POA: Diagnosis not present

## 2023-05-14 DIAGNOSIS — Z0130 Encounter for examination of blood pressure without abnormal findings: Secondary | ICD-10-CM | POA: Diagnosis not present

## 2023-05-14 DIAGNOSIS — M5441 Lumbago with sciatica, right side: Secondary | ICD-10-CM | POA: Diagnosis not present

## 2023-05-14 DIAGNOSIS — E1165 Type 2 diabetes mellitus with hyperglycemia: Secondary | ICD-10-CM

## 2023-05-14 MED ORDER — CYANOCOBALAMIN 1000 MCG/ML IJ SOLN
1000.0000 ug | Freq: Once | INTRAMUSCULAR | Status: AC
Start: 2023-05-14 — End: 2023-05-14
  Administered 2023-05-14: 1000 ug via INTRAMUSCULAR

## 2023-05-14 MED ORDER — HYDROXYZINE HCL 25 MG PO TABS: 25.0000 mg | ORAL_TABLET | Freq: Three times a day (TID) | ORAL | 3 refills | Status: DC | PRN

## 2023-05-14 MED ORDER — FOLIC ACID 1 MG PO TABS: 1.0000 mg | ORAL_TABLET | Freq: Every day | ORAL | 1 refills | Status: DC

## 2023-05-14 NOTE — Progress Notes (Signed)
Established Patient Office Visit  Subjective:  Patient ID: Lance House, male    DOB: 1951/03/08  Age: 72 y.o. MRN: [id number]  Chief Complaint  Patient presents with   Follow-up    6 week f/u    Patient is here today for his 1 month follow up.  He has been feeling poorly since last appointment.   He does not have additional concerns to discuss today.  Labs are not due today. He needs refills.   I have reviewed his active problem list, medication list, allergies, health maintenance, notes from last encounter, lab results, and PDMP records for his appointment today.      No other concerns at this time.   Past Medical History:  Diagnosis Date   Abdominal pain 04/08/2019   Anxiety    Anxiety hyperventilation 05/08/2019   Closed compression fracture of L4 vertebra (HCC) 08/18/2021   Closed fracture of distal fibula 02/13/2021   Compression fracture of thoracic vertebra (HCC) 08/18/2021   Depression    Elevated troponin    Gout    Hypokalemia 08/16/2021   Hyponatremia 08/16/2021   ICH (intracerebral hemorrhage) (HCC) 08/15/2021   Incarcerated left inguinal hernia    Injury of right ankle 02/13/2021   Left flank pain 02/03/2013   Other fatigue 10/25/2022   Pain    Pressure injury of skin 09/11/2021   Rhabdomyolysis 08/16/2021    Past Surgical History:  Procedure Laterality Date   APPENDECTOMY  1994   INGUINAL HERNIA REPAIR Left 04/13/2019   Procedure: HERNIA REPAIR INGUINAL ADULT OPEN, LEFT;  Surgeon: Duanne Guess, MD;  Location: [facility] ORS;  Service: General;  Laterality: Left;   NASAL SINUS SURGERY  2015   [facility]   RIGHT HEART CATH Right 04/26/2023   Procedure: RIGHT HEART CATH;  Surgeon: Laurey Morale, MD;  Location: [facility] INVASIVE CV LAB;  Service: Cardiovascular;  Laterality: Right;   VIDEO ASSISTED THORACOSCOPY (VATS)/DECORTICATION Left 09/11/2021   Procedure: VIDEO ASSISTED THORACOSCOPY (VATS)/DECORTICATION;  Surgeon: Loreli Slot, MD;  Location:  [facility] OR;  Service: Thoracic;  Laterality: Left;    Social History   Socioeconomic History   Marital status: Married    Spouse name: Not on file   Number of children: Not on file   Years of education: Not on file   Highest education level: Not on file  Occupational History   Not on file  Tobacco Use   Smoking status: Every Day    Current packs/day: 1.00    Types: Cigarettes   Smokeless tobacco: Never  Vaping Use   Vaping status: Never Used  Substance and Sexual Activity   Alcohol use: Not Currently    Alcohol/week: 42.0 standard drinks of alcohol    Types: 42 Cans of beer per week    Comment: quit around 05/2021   Drug use: Not Currently   Sexual activity: Not on file  Other Topics Concern   Not on file  Social History Narrative   Lives with wife, [name]. No indoor pets.   Social Drivers of Corporate investment banker Strain: Not on file  Food Insecurity: Not on file  Transportation Needs: Not on file  Physical Activity: Not on file  Stress: Not on file  Social Connections: Not on file  Intimate Partner Violence: Not on file    Family History  Problem Relation Age of Onset   Skin cancer Father    Multiple sclerosis Sister    Non-Hodgkin's lymphoma Brother     Allergies  Allergen Reactions   Ciprocin-Fluocin-Procin [Fluocinolone] Anaphylaxis    Hip hurt   Ciprofloxacin Other (See Comments)    Unknown reaction   Colchicine Other (See Comments)   Ezetimibe Nausea And Vomiting   Farxiga [Dapagliflozin] Diarrhea    dizziness   Meloxicam Diarrhea and Swelling   Duloxetine Diarrhea, Nausea And Vomiting and Other (See Comments)    Altered mental status    Review of Systems  Constitutional:  Positive for malaise/fatigue.  Neurological:  Positive for weakness.  All other systems reviewed and are negative.      Objective:   BP 130/65   Pulse (!) 103   Ht 6\' 1"  (1.854 m)   Wt 243 lb 3.2 oz (110.3 kg)   SpO2 96%   BMI 32.09 kg/m   Vitals:   05/14/23  1116  BP: 130/65  Pulse: (!) 103  Height: 6\' 1"  (1.854 m)  Weight: 243 lb 3.2 oz (110.3 kg)  SpO2: 96%  BMI (Calculated): 32.09    Physical Exam Vitals and nursing note reviewed.  Constitutional:      Appearance: Normal appearance. He is normal weight.  Eyes:     Extraocular Movements: Extraocular movements intact.     Conjunctiva/sclera: Conjunctivae normal.     Pupils: Pupils are equal, round, and reactive to light.  Cardiovascular:     Rate and Rhythm: Normal rate and regular rhythm.     Pulses: Normal pulses.     Heart sounds: Normal heart sounds.  Pulmonary:     Effort: Pulmonary effort is normal.     Breath sounds: Normal breath sounds.  Neurological:     General: No focal deficit present.     Mental Status: He is alert. Mental status is at baseline.     Motor: Weakness present.     Gait: Gait abnormal (shuffling).  Psychiatric:        Mood and Affect: Mood normal.        Behavior: Behavior normal.        Thought Content: Thought content normal.        Judgment: Judgment normal.      No results found for any visits on 05/14/23.  Recent Results (from the past 2160 hours)  CBC     Status: Abnormal   Collection Time: 04/22/23 12:09 PM  Result Value Ref Range   WBC 6.6 3.4 - 10.8 x10E3/uL   RBC 5.87 (H) 4.14 - 5.80 x10E6/uL   Hemoglobin 17.7 13.0 - 17.7 g/dL   Hematocrit 16.1 (H) 09.6 - 51.0 %   MCV 91 79 - 97 fL   MCH 30.2 26.6 - 33.0 pg   MCHC 33.3 31.5 - 35.7 g/dL   RDW 04.5 40.9 - 81.1 %   Platelets 187 150 - 450 x10E3/uL  Basic Metabolic Panel (BMET)     Status: Abnormal   Collection Time: 04/22/23 12:09 PM  Result Value Ref Range   Glucose 89 70 - 99 mg/dL   BUN 6 (L) 8 - 27 mg/dL   Creatinine, Ser 9.14 0.76 - 1.27 mg/dL   eGFR 94 >78 GN/FAO/1.30   BUN/Creatinine Ratio 7 (L) 10 - 24   Sodium 137 134 - 144 mmol/L   Potassium 4.5 3.5 - 5.2 mmol/L   Chloride 100 96 - 106 mmol/L   CO2 22 20 - 29 mmol/L   Calcium 9.0 8.6 - 10.2 mg/dL  POCT I-Stat  EG7     Status: Abnormal   Collection Time: 04/26/23  9:22 AM  Result  Value Ref Range   pH, Ven 7.337 7.25 - 7.43   pCO2, Ven 52.5 44 - 60 mmHg   pO2, Ven 37 32 - 45 mmHg   Bicarbonate 28.1 (H) 20.0 - 28.0 mmol/L   TCO2 30 22 - 32 mmol/L   O2 Saturation 65 %   Acid-Base Excess 1.0 0.0 - 2.0 mmol/L   Sodium 136 135 - 145 mmol/L   Potassium 4.1 3.5 - 5.1 mmol/L   Calcium, Ion 1.21 1.15 - 1.40 mmol/L   HCT 51.0 39.0 - 52.0 %   Hemoglobin 17.3 (H) 13.0 - 17.0 g/dL   Sample type MIXED VENOUS SAMPLE    Comment NOTIFIED PHYSICIAN   POCT I-Stat EG7     Status: Abnormal   Collection Time: 04/26/23  9:23 AM  Result Value Ref Range   pH, Ven 7.333 7.25 - 7.43   pCO2, Ven 52.0 44 - 60 mmHg   pO2, Ven 36 32 - 45 mmHg   Bicarbonate 27.6 20.0 - 28.0 mmol/L   TCO2 29 22 - 32 mmol/L   O2 Saturation 65 %   Acid-Base Excess 0.0 0.0 - 2.0 mmol/L   Sodium 137 135 - 145 mmol/L   Potassium 4.0 3.5 - 5.1 mmol/L   Calcium, Ion 1.18 1.15 - 1.40 mmol/L   HCT 51.0 39.0 - 52.0 %   Hemoglobin 17.3 (H) 13.0 - 17.0 g/dL   Sample type MIXED VENOUS SAMPLE    Comment NOTIFIED PHYSICIAN   Pulmonary Function Test [facility] Only     Status: None   Collection Time: 06/11/23  2:37 PM  Result Value Ref Range   FVC-%Pred-Pre 88 %   FVC-Post 4.17 L   FVC-%Pred-Post 90 %   FVC-%Change-Post 2 %   FEV1-Pre 2.60 L   FEV1-%Pred-Pre 76 %   FEV1-Post 1.84 L   FEV1-%Pred-Post 54 %   FEV1-%Change-Post -29 %   FEV6-Pre 4.01 L   FEV6-%Pred-Pre 92 %   FEV6-Post 3.98 L   FEV6-%Pred-Post 91 %   FEV6-%Change-Post 0 %   Pre FEV1/FVC ratio 64 %   FEV1FVC-%Pred-Pre 86 %   Post FEV1/FVC ratio 44 %   FEV1FVC-%Change-Post -30 %   Pre FEV6/FVC Ratio 98 %   FEV6FVC-%Pred-Pre 103 %   Post FEV6/FVC ratio 95 %   FEV6FVC-%Pred-Post 100 %   FEV6FVC-%Change-Post -2 %   FEF 25-75 Pre 1.46 L/sec   FEF2575-%Pred-Pre 57 %   FEF 25-75 Post 1.08 L/sec   FEF2575-%Pred-Post 42 %   FEF2575-%Change-Post -26 %   RV 1.54 L   RV % pred 61  %   TLC 5.84 L   TLC % pred 80 %   DLCO unc 7.69 ml/min/mmHg   DLCO unc % pred 28 %   DL/VA 2.13 ml/min/mmHg/L   DL/VA % pred 61 %       Assessment & Plan:   Problem List Items Addressed This Visit       Active Problems   Chronic obstructive pulmonary disease (HCC)   Patient stable.  Well controlled with current therapy.   Continue current meds.       Mixed hyperlipidemia   Checking labs today.  Continue current therapy for lipid control. Will modify as needed based on labwork results.       B12 deficiency due to diet - Primary   Chronic bilateral low back pain with bilateral sciatica   Patient stable.  Well controlled with current therapy.   Continue current meds.  Relevant Medications   hydrOXYzine (ATARAX) 25 MG tablet   Type 2 diabetes mellitus with hyperglycemia, without long-term current use of insulin (HCC)   Continue current diabetes POC, as patient has been well controlled on current regimen.  Will adjust meds if needed based on labs.         Return in about 1 month (around 06/14/2023) for F/U.   Total time spent: 20 minutes  Miki Kins, FNP  05/14/2023   This document may have been prepared by [facility] Voice Recognition software and as such may include unintentional dictation errors.

## 2023-05-20 ENCOUNTER — Ambulatory Visit: Payer: Medicare HMO | Attending: Cardiology | Admitting: Cardiology

## 2023-05-20 ENCOUNTER — Other Ambulatory Visit: Payer: Self-pay

## 2023-05-20 ENCOUNTER — Other Ambulatory Visit: Admission: RE | Admit: 2023-05-20 | Payer: Medicare HMO | Source: Ambulatory Visit | Admitting: Cardiology

## 2023-05-20 VITALS — BP 134/84 | HR 85 | Wt 243.0 lb

## 2023-05-20 DIAGNOSIS — E785 Hyperlipidemia, unspecified: Secondary | ICD-10-CM | POA: Insufficient documentation

## 2023-05-20 DIAGNOSIS — I5032 Chronic diastolic (congestive) heart failure: Secondary | ICD-10-CM | POA: Insufficient documentation

## 2023-05-20 DIAGNOSIS — F17210 Nicotine dependence, cigarettes, uncomplicated: Secondary | ICD-10-CM | POA: Diagnosis not present

## 2023-05-20 DIAGNOSIS — R030 Elevated blood-pressure reading, without diagnosis of hypertension: Secondary | ICD-10-CM | POA: Diagnosis not present

## 2023-05-20 DIAGNOSIS — Z8679 Personal history of other diseases of the circulatory system: Secondary | ICD-10-CM | POA: Diagnosis not present

## 2023-05-20 DIAGNOSIS — M545 Low back pain, unspecified: Secondary | ICD-10-CM | POA: Diagnosis not present

## 2023-05-20 DIAGNOSIS — Z79899 Other long term (current) drug therapy: Secondary | ICD-10-CM | POA: Insufficient documentation

## 2023-05-20 DIAGNOSIS — I2510 Atherosclerotic heart disease of native coronary artery without angina pectoris: Secondary | ICD-10-CM | POA: Insufficient documentation

## 2023-05-20 DIAGNOSIS — I272 Pulmonary hypertension, unspecified: Secondary | ICD-10-CM | POA: Diagnosis not present

## 2023-05-20 DIAGNOSIS — M109 Gout, unspecified: Secondary | ICD-10-CM | POA: Insufficient documentation

## 2023-05-20 DIAGNOSIS — F419 Anxiety disorder, unspecified: Secondary | ICD-10-CM | POA: Diagnosis not present

## 2023-05-20 DIAGNOSIS — J449 Chronic obstructive pulmonary disease, unspecified: Secondary | ICD-10-CM | POA: Diagnosis not present

## 2023-05-20 MED ORDER — ROSUVASTATIN CALCIUM 10 MG PO TABS: 10.0000 mg | ORAL_TABLET | Freq: Every day | ORAL | 3 refills | Status: DC

## 2023-05-20 MED ORDER — ROSUVASTATIN CALCIUM 10 MG PO TABS
10.0000 mg | ORAL_TABLET | Freq: Every day | ORAL | 3 refills | Status: DC
Start: 2023-05-20 — End: 2023-07-10
  Filled 2023-05-20 (×2): qty 90, 90d supply, fill #0

## 2023-05-20 NOTE — Patient Instructions (Signed)
Medication Changes:  Start taking Crestor 10 mg (1 tablet) daily.  Lab Work:  Labs to be done in 2 months, your results will be available in MyChart, we will contact you for abnormal readings. Come to the medical mall entrance before 4:30 pm.  Testing/Procedures:  Your physician has recommended that you have a pulmonary function test. Pulmonary Function Tests are a group of tests that measure how well air moves in and out of your lungs. 06/06/23 @ 1045  You will have  a V/Q scan on 05/21/23 @ 2pm please come to the medical mall entrance and arrive at 1:30 pm.  Please have your CXR today.      Special Instructions // Education:  Do the following things EVERYDAY: Weigh yourself in the morning before breakfast. Write it down and keep it in a log. Take your medicines as prescribed Eat low salt foods--Limit salt (sodium) to 2000 mg per day.  Stay as active as you can everyday Limit all fluids for the day to less than 2 liters   Follow-Up in: follow up in 3 weeks.    If you have any questions or concerns before your next appointment please send [place] a message through [place] or call our office at [phone] Monday-Friday 8 am-5 pm.   If you have an urgent need after hours on the weekend please call your Primary Cardiologist or the [facility] in [place] at [phone].   At the [facility], you and your health needs are our priority. We have a designated team specialized in the treatment of Heart Failure. This Care Team includes your primary Heart Failure Specialized Cardiologist (physician), Advanced Practice Providers (APPs- Physician Assistants and Nurse Practitioners), and Pharmacist who all work together to provide you with the care you need, when you need it.   You may see any of the following providers on your designated Care Team at your next follow up:  Dr. Arvilla Meres Dr. Marca Ancona Dr. Dorthula Nettles Dr. Theresia Bough Tonye Becket, NP Robbie Lis, [place] [address] [place], [place] Brynda Peon, NP [place] Lee, NP Clarisa Kindred, NP Enos Fling, PharmD

## 2023-05-20 NOTE — Progress Notes (Signed)
PCP: Miki Kins, FNP HF Cardiology: Dr. Shirlee Latch  Lance House is a 72 y/o male with a history of hyperlipidemia, COPD, intracranial hemorrhage, alcohol abuse, gout, anxiety and suspected diastolic CHF.   Was in the ED 01/28/23 due to bilateral leg swelling, worse on the right along with increased SOB. Ultrasound negative for DVT. IV lasix provided. Was in the ED 02/08/23 due to chest pain and SOB. Was given oral lasix 11 days prior but still was SOB. Troponin normal. IV lasix provided. Chest CTA negative for PE.    Echo 03/18/23 showed EF 55-60% with mild LVH along with moderately elevated PA pressure of 49 mmHg and D-shaped interventricular septum but normal RV size/systolic function.   He was unable to tolerate [place] due to GI side effects.   He had RHC in 10/24, showing normal filling pressures with moderate pulmonary arterial hypertension, PVR 5 WU.    He returns for followup of CHF/pulmonary hypertension.  He has cut smoking back to 1/2 ppd.  Main complaint currently is low back pain, this necessitates use of walker.  He sleeps in a recliner because of his back.  No chest pain.  No dyspnea walking around his house, does not need walker in the house.  Mild dyspnea walking to the office today, uses walker outside the house.  Occasional wheezing but not recently.   Labs (7/24): BNP normal Labs (9/24): K 4.5, creatinine 0.82, LDL 149  ECG (personally reviewed): NSR, borderline LVH  PMH: 1. COPD: Suspected.  Long-time smoker.  2. Hyperlipidemia 3. ETOH abuse: quit 2022 4. Low back pain: History of back surgery. 5. CAD: Coronary calcification noted on CTA chest in 7/24.  6. Chronic diastolic CHF/pulmonary hypertension:  Echo (8/24) with EF 55-60% with mild LVH along with moderately elevated PA pressure of 49 mmHg and D-shaped interventricular septum but normal RV size/systolic function. - RHC (10/24): mean RA 7, PA 54/22 mean 34, mean PCWP 9, CI 2.09, PVR 5 WU.  7. Gout 8. History of  intracerebral hemorrhage  ROS: All systems negative except as listed in HPI, PMH and Problem List.  SH:  Social History   Socioeconomic History   Marital status: Married    Spouse name: Not on file   Number of children: Not on file   Years of education: Not on file   Highest education level: Not on file  Occupational History   Not on file  Tobacco Use   Smoking status: Every Day    Current packs/day: 1.00    Types: Cigarettes   Smokeless tobacco: Never  Vaping Use   Vaping status: Never Used  Substance and Sexual Activity   Alcohol use: Not Currently    Alcohol/week: 42.0 standard drinks of alcohol    Types: 42 Cans of beer per week    Comment: quit around 05/2021   Drug use: Not Currently   Sexual activity: Not on file  Other Topics Concern   Not on file  Social History Narrative   Lives with wife, Lance House. No indoor pets.   Social Determinants of Health   Financial Resource Strain: Not on file  Food Insecurity: Not on file  Transportation Needs: Not on file  Physical Activity: Not on file  Stress: Not on file  Social Connections: Not on file  Intimate Partner Violence: Not on file    FH:  Family History  Problem Relation Age of Onset   Skin cancer Father    Multiple sclerosis Sister    Non-Hodgkin's lymphoma  Brother     Current Outpatient Medications  Medication Sig Dispense Refill   Cholecalciferol (VITAMIN D3) 125 MCG (5000 UT) TABS Take 1 tablet by mouth daily.     clonazePAM (KLONOPIN) 1 MG tablet Take 1 tablet (1 mg total) by mouth 2 (two) times daily. 60 tablet 2   folic acid (FOLVITE) 1 MG tablet Take 1 tablet (1 mg total) by mouth daily. 90 tablet 1   gabapentin (NEURONTIN) 400 MG capsule TAKE 1 CAPSULE BY MOUTH THREE TIMES DAILY 90 capsule 0   hydrOXYzine (ATARAX) 25 MG tablet Take 1 tablet (25 mg total) by mouth every 8 (eight) hours as needed for anxiety. 90 tablet 3   Methylcobalamin (B12 SL) Place 5,000 mcg under the tongue daily. Patient  doesn't know dosage     oxyCODONE (OXY IR/ROXICODONE) 5 MG immediate release tablet Take 1 tablet (5 mg total) by mouth 3 (three) times daily as needed for severe pain (pain score 7-10). 90 tablet 0   pyridOXINE (VITAMIN B6) 100 MG tablet Take 100 mg by mouth daily.     rosuvastatin (CRESTOR) 10 MG tablet Take 1 tablet (10 mg total) by mouth daily. 90 tablet 3   No current facility-administered medications for this visit.   Vitals:   05/20/23 1110 05/20/23 1114  BP: (!) 152/91 134/84  Pulse: 92 85  SpO2: 94% 97%  Weight: 243 lb (110.2 kg)    Wt Readings from Last 3 Encounters:  05/20/23 243 lb (110.2 kg)  05/14/23 243 lb 3.2 oz (110.3 kg)  04/26/23 240 lb (108.9 kg)   Lab Results  Component Value Date   CREATININE 0.82 04/22/2023   CREATININE 0.79 02/08/2023   CREATININE 0.76 01/28/2023   PHYSICAL EXAM: General: NAD Neck: No JVD, no thyromegaly or thyroid nodule.  Lungs: Distant BS CV: Nondisplaced PMI.  Heart regular S1/S2, no S3/S4, no murmur.  Trace ankle edema.  No carotid bruit.  Normal pedal pulses.  Abdomen: Soft, nontender, no hepatosplenomegaly, no distention.  Skin: Intact without lesions or rashes.  Neurologic: Alert and oriented x 3.  Psych: Normal affect. Extremities: No clubbing or cyanosis.  HEENT: Normal.   ASSESSMENT & PLAN: 1. Chronic diastolic CHF/pulmonary hypertension: Echo in 8/24 showed EF 55-60% with mild LVH along with moderately elevated PA pressure of 49 mmHg and D-shaped interventricular septum but normal RV size/systolic function.  RHC in 10/24 showed moderate PAH with PVR 5 WU and normal RA pressure and PCWP.  Based on RHC and on exam, he does not appear to need a diuretic at this time.  Cause of pulmonary hypertension is uncertain, but I suspect this is most likely group 3 PH due to COPD. OSA may play a role.   - No diuretic at this time.  - I will arrange for PFTs to assess severity of COPD.  - He had a CTA chest in 7/24 with no PE, but should  have V/Q scan to rule out chronic PEs given pulmonary hypertension.  I ordered this today.  - He will need a sleep study, will order this next appt.  2. COPD: Strongly suspected.  He still smokes.  - He is cutting back smoking, does not want pharmacological aide. Strongly encouraged stopping.  3. CAD: Coronary calcification noted on 7/24 CTA chest.  LDL was 149 in 9/24.  - I will start him on Crestor 10 mg daily with lipids/LFTs in 2 months.  4. Elevated BP: BP high today but he checks at home and SBP in  120s per his report. Will follow.   Followup in 3 months.   Lance House 05/20/2023

## 2023-05-21 ENCOUNTER — Ambulatory Visit
Admission: RE | Admit: 2023-05-21 | Discharge: 2023-05-21 | Disposition: A | Discharge status: Home / Self Care | Payer: Medicare HMO | Source: Ambulatory Visit | Attending: Cardiology | Admitting: Cardiology

## 2023-05-21 ENCOUNTER — Encounter
Admission: RE | Admit: 2023-05-21 | Discharge: 2023-05-21 | Disposition: A | Discharge status: Home / Self Care | Payer: Medicare HMO | Source: Ambulatory Visit | Attending: Cardiology | Admitting: Cardiology

## 2023-05-21 DIAGNOSIS — I5032 Chronic diastolic (congestive) heart failure: Secondary | ICD-10-CM | POA: Diagnosis not present

## 2023-05-21 DIAGNOSIS — I509 Heart failure, unspecified: Secondary | ICD-10-CM | POA: Diagnosis not present

## 2023-05-21 DIAGNOSIS — I272 Pulmonary hypertension, unspecified: Secondary | ICD-10-CM | POA: Diagnosis not present

## 2023-05-21 DIAGNOSIS — R0602 Shortness of breath: Secondary | ICD-10-CM | POA: Diagnosis not present

## 2023-05-21 DIAGNOSIS — J8410 Pulmonary fibrosis, unspecified: Secondary | ICD-10-CM | POA: Diagnosis not present

## 2023-05-21 MED ORDER — TECHNETIUM TO 99M ALBUMIN AGGREGATED
4.0000 | Freq: Once | INTRAVENOUS | Status: AC | PRN
  Administered 2023-05-21: 4.21 via INTRAVENOUS

## 2023-05-24 NOTE — Telephone Encounter (Signed)
Pt called requesting a call back regarding his medications and all being sent to the same pharmacy- please advise

## 2023-05-29 ENCOUNTER — Other Ambulatory Visit: Payer: Self-pay | Admitting: Family

## 2023-05-29 DIAGNOSIS — F419 Anxiety disorder, unspecified: Secondary | ICD-10-CM

## 2023-06-04 ENCOUNTER — Other Ambulatory Visit: Payer: Self-pay | Admitting: Family

## 2023-06-06 ENCOUNTER — Other Ambulatory Visit: Payer: Self-pay | Admitting: Family

## 2023-06-06 ENCOUNTER — Ambulatory Visit: Payer: Medicare HMO

## 2023-06-07 ENCOUNTER — Other Ambulatory Visit: Payer: Self-pay

## 2023-06-07 MED ORDER — OXYCODONE HCL 5 MG PO TABS: 5.0000 mg | ORAL_TABLET | Freq: Three times a day (TID) | ORAL | 0 refills | Status: DC | PRN

## 2023-06-10 ENCOUNTER — Ambulatory Visit: Payer: Medicare HMO | Admitting: Cardiology

## 2023-06-11 ENCOUNTER — Ambulatory Visit: Payer: Medicare HMO | Attending: Cardiology

## 2023-06-11 DIAGNOSIS — F17210 Nicotine dependence, cigarettes, uncomplicated: Secondary | ICD-10-CM | POA: Diagnosis present

## 2023-06-11 DIAGNOSIS — J449 Chronic obstructive pulmonary disease, unspecified: Secondary | ICD-10-CM | POA: Insufficient documentation

## 2023-06-11 DIAGNOSIS — I5032 Chronic diastolic (congestive) heart failure: Secondary | ICD-10-CM

## 2023-06-11 DIAGNOSIS — I509 Heart failure, unspecified: Secondary | ICD-10-CM | POA: Diagnosis present

## 2023-06-11 LAB — PULMONARY FUNCTION TEST ARMC ONLY
DL/VA % pred: 61 %
DL/VA: 2.5 ml/min/mmHg/L
DLCO unc % pred: 28 %
DLCO unc: 7.69 ml/min/mmHg
FEF 25-75 Post: 1.08 L/s
FEF 25-75 Pre: 1.46 L/s
FEF2575-%Change-Post: -26 %
FEF2575-%Pred-Post: 42 %
FEF2575-%Pred-Pre: 57 %
FEV1-%Change-Post: -29 %
FEV1-%Pred-Post: 54 %
FEV1-%Pred-Pre: 76 %
FEV1-Post: 1.84 L
FEV1-Pre: 2.6 L
FEV1FVC-%Change-Post: -30 %
FEV1FVC-%Pred-Pre: 86 %
FEV6-%Change-Post: 0 %
FEV6-%Pred-Post: 91 %
FEV6-%Pred-Pre: 92 %
FEV6-Post: 3.98 L
FEV6-Pre: 4.01 L
FEV6FVC-%Change-Post: -2 %
FEV6FVC-%Pred-Post: 100 %
FEV6FVC-%Pred-Pre: 103 %
FVC-%Change-Post: 2 %
FVC-%Pred-Post: 90 %
FVC-%Pred-Pre: 88 %
FVC-Post: 4.17 L
Post FEV1/FVC ratio: 44 %
Post FEV6/FVC ratio: 95 %
Pre FEV1/FVC ratio: 64 %
Pre FEV6/FVC Ratio: 98 %
RV % pred: 61 %
RV: 1.54 L
TLC % pred: 80 %
TLC: 5.84 L

## 2023-06-11 MED ORDER — ALBUTEROL SULFATE (2.5 MG/3ML) 0.083% IN NEBU
2.5000 mg | INHALATION_SOLUTION | Freq: Once | RESPIRATORY_TRACT | Status: AC
  Administered 2023-06-11: 2.5 mg via RESPIRATORY_TRACT
  Filled 2023-06-11: qty 3

## 2023-06-18 ENCOUNTER — Ambulatory Visit: Payer: Medicare HMO | Attending: Cardiology | Admitting: Cardiology

## 2023-06-18 ENCOUNTER — Encounter: Payer: Medicare HMO | Admitting: Cardiology

## 2023-06-18 VITALS — BP 137/83 | HR 92 | Wt 243.0 lb

## 2023-06-18 DIAGNOSIS — J449 Chronic obstructive pulmonary disease, unspecified: Secondary | ICD-10-CM | POA: Diagnosis not present

## 2023-06-18 DIAGNOSIS — E559 Vitamin D deficiency, unspecified: Secondary | ICD-10-CM

## 2023-06-18 DIAGNOSIS — G4719 Other hypersomnia: Secondary | ICD-10-CM

## 2023-06-18 DIAGNOSIS — I5032 Chronic diastolic (congestive) heart failure: Secondary | ICD-10-CM

## 2023-06-18 DIAGNOSIS — E782 Mixed hyperlipidemia: Secondary | ICD-10-CM | POA: Diagnosis not present

## 2023-06-18 NOTE — Progress Notes (Signed)
Height: 6'1"    Weight: 243 lb BMI: 32  Today's Date: 06/18/23  STOP BANG RISK ASSESSMENT S (snore) Have you been told that you snore?     YES   T (tired) Are you often tired, fatigued, or sleepy during the day?   YES  O (obstruction) Do you stop breathing, choke, or gasp during sleep? NO   P (pressure) Do you have or are you being treated for high blood pressure? NO   B (BMI) Is your body index greater than 35 kg/m? NO   A (age) Are you 70 years old or older? YES   N (neck) Do you have a neck circumference greater than 16 inches?      G (gender) Are you a male? YES   TOTAL STOP/BANG "YES" ANSWERS 4                                                                       For Office Use Only              Procedure Order Form    YES to 3+ Stop Bang questions OR two clinical symptoms - patient qualifies for WatchPAT (CPT 95800)      Clinical Notes: Will consult Sleep Specialist and refer for management of therapy due to patient increased risk of Sleep Apnea. Ordering a sleep study due to the following two clinical symptoms: Excessive daytime sleepiness G47.10 / Loud snoring R06.83

## 2023-06-18 NOTE — Progress Notes (Signed)
PCP: Miki Kins, FNP HF Cardiology: Dr. Shirlee Latch  [name] is a 72 y/o male with a history of hyperlipidemia, COPD, intracranial hemorrhage, alcohol abuse, gout, anxiety and suspected diastolic CHF.   Was in the ED 01/28/23 due to bilateral leg swelling, worse on the right along with increased SOB. Ultrasound negative for DVT. IV lasix provided. Was in the ED 02/08/23 due to chest pain and SOB. Was given oral lasix 11 days prior but still was SOB. Troponin normal. IV lasix provided. Chest CTA negative for PE.    Echo 03/18/23 showed EF 55-60% with mild LVH along with moderately elevated PA pressure of 49 mmHg and D-shaped interventricular septum but normal RV size/systolic function.   He was unable to tolerate [place] due to GI side effects.   He had RHC in 10/24, showing normal filling pressures with moderate pulmonary arterial hypertension, PVR 5 WU.  V/Q scan in 11/24 was low probability for PE.   He returns for followup of CHF/pulmonary hypertension.  He has cut smoking back to 1/2 ppd, hard for him to get lower than this.  Main complaint currently is low back pain, this necessitates use of walker.  He sleeps in a recliner because of his back.  No chest pain.  No dyspnea walking around the house with his walker.  He does fatigue walking outside the house easily.  He tried rosuvastatin but stopped it as it gave him leg pain. He snores and has daytime sleepiness.   Labs (7/24): BNP normal Labs (9/24): K 4.5, creatinine 0.82, LDL 149  PMH: 1. COPD: Suspected.  Long-time smoker.  2. Hyperlipidemia 3. ETOH abuse: quit 2022 4. Low back pain: History of back surgery. 5. CAD: Coronary calcification noted on CTA chest in 7/24.  6. Chronic diastolic CHF/pulmonary hypertension:  Echo (8/24) with EF 55-60% with mild LVH along with moderately elevated PA pressure of 49 mmHg and D-shaped interventricular septum but normal RV size/systolic function. - RHC (10/24): mean RA 7, PA 54/22 mean 34, mean  PCWP 9, CI 2.09, PVR 5 WU.  - V/Q scan (11/24): Low probability for PE.  7. Gout 8. History of intracerebral hemorrhage  ROS: All systems negative except as listed in HPI, PMH and Problem List.  SH:  Social History   Socioeconomic History   Marital status: Married    Spouse name: Not on file   Number of children: Not on file   Years of education: Not on file   Highest education level: Not on file  Occupational History   Not on file  Tobacco Use   Smoking status: Every Day    Current packs/day: 1.00    Types: Cigarettes   Smokeless tobacco: Never  Vaping Use   Vaping status: Never Used  Substance and Sexual Activity   Alcohol use: Not Currently    Alcohol/week: 42.0 standard drinks of alcohol    Types: 42 Cans of beer per week    Comment: quit around 05/2021   Drug use: Not Currently   Sexual activity: Not on file  Other Topics Concern   Not on file  Social History Narrative   Lives with wife, [name]. No indoor pets.   Social Determinants of Health   Financial Resource Strain: Not on file  Food Insecurity: Not on file  Transportation Needs: Not on file  Physical Activity: Not on file  Stress: Not on file  Social Connections: Not on file  Intimate Partner Violence: Not on file    FH:  Family History  Problem Relation Age of Onset   Skin cancer Father    Multiple sclerosis Sister    Non-Hodgkin's lymphoma Brother     Current Outpatient Medications  Medication Sig Dispense Refill   Cholecalciferol (VITAMIN D3) 125 MCG (5000 UT) TABS Take 1 tablet by mouth daily.     clonazePAM (KLONOPIN) 1 MG tablet Take 1 tablet by mouth twice daily 60 tablet 0   folic acid (FOLVITE) 1 MG tablet Take 1 tablet (1 mg total) by mouth daily. 90 tablet 1   gabapentin (NEURONTIN) 400 MG capsule TAKE 1 CAPSULE BY MOUTH THREE TIMES DAILY 90 capsule 0   hydrOXYzine (ATARAX) 25 MG tablet Take 1 tablet (25 mg total) by mouth every 8 (eight) hours as needed for anxiety. 90 tablet 3    Methylcobalamin (B12 SL) Place 5,000 mcg under the tongue daily. Patient doesn't know dosage     oxyCODONE (OXY IR/ROXICODONE) 5 MG immediate release tablet Take 1 tablet (5 mg total) by mouth 3 (three) times daily as needed for severe pain (pain score 7-10). 90 tablet 0   pyridOXINE (VITAMIN B6) 100 MG tablet Take 100 mg by mouth daily.     rosuvastatin (CRESTOR) 10 MG tablet Take 1 tablet (10 mg total) by mouth daily. 90 tablet 3   No current facility-administered medications for this visit.   Vitals:   06/18/23 1208  BP: 137/83  Pulse: 92  SpO2: 95%  Weight: 243 lb (110.2 kg)   Wt Readings from Last 3 Encounters:  06/18/23 243 lb (110.2 kg)  05/20/23 243 lb (110.2 kg)  05/14/23 243 lb 3.2 oz (110.3 kg)   Lab Results  Component Value Date   CREATININE 0.82 04/22/2023   CREATININE 0.79 02/08/2023   CREATININE 0.76 01/28/2023   PHYSICAL EXAM: General: NAD Neck: No JVD, no thyromegaly or thyroid nodule.  Lungs: Distant BS CV: Nondisplaced PMI.  Heart regular S1/S2, no S3/S4, no murmur.  No peripheral edema.  No carotid bruit.  Normal pedal pulses.  Abdomen: Soft, nontender, no hepatosplenomegaly, no distention.  Skin: Intact without lesions or rashes.  Neurologic: Alert and oriented x 3.  Psych: Normal affect. Extremities: No clubbing or cyanosis.  HEENT: Normal.   ASSESSMENT & PLAN: 1. Chronic diastolic CHF/pulmonary hypertension: Echo in 8/24 showed EF 55-60% with mild LVH along with moderately elevated PA pressure of 49 mmHg and D-shaped interventricular septum but normal RV size/systolic function.  RHC in 10/24 showed moderate PAH with PVR 5 WU and normal RA pressure and PCWP.  Based on RHC and on exam, he does not appear to need a diuretic at this time.  Cause of pulmonary hypertension is uncertain, but I suspect this is most likely group 3 PH due to COPD. OSA may play a role.  V/Q scan (11/24) was low probability for PE. He had a CTA chest in 7/24 with no PE. - No diuretic  at this time.  - PFTs done to assess severity of COPD but not yet officially interpreted.  - Suspect OSA, I ordered sleep study today.   2. COPD: Strongly suspected.  He still smokes.  - He is cutting back smoking, does not want pharmacological aide. Strongly encouraged stopping.  3. CAD: Coronary calcification noted on 7/24 CTA chest.  LDL was 149 in 9/24. He did not tolerate rosuvastatin due to muscle pain.  - He wants to try to get lipids down on his own before trying a medication again.  I told him we would  check lipids in 3 months and if LDL not markedly lower, I would recommend that he try Repatha or Leqvio.   Followup in 3 months with NP [facility].   Marca Ancona 06/18/2023

## 2023-06-18 NOTE — Patient Instructions (Addendum)
Medication Changes:  None, continue current medications  Lab Work:  Your physician recommends that you return for a FASTING lipid profile: in 3 months (around Sep 18, 2023) at the: [facility] 1st desk on the right to check in (REGISTRATION)  Lab hours: Monday- Friday (7:30 am- 5:30 pm)  Testing/Procedures:  None  Referrals:  You have been referred to [facility] for sleep study, they will call you to schedule  Special Instructions // Education:  Do the following things EVERYDAY: Weigh yourself in the morning before breakfast. Write it down and keep it in a log. Take your medicines as prescribed Eat low salt foods--Limit salt (sodium) to 2000 mg per day.  Stay as active as you can everyday Limit all fluids for the day to less than 2 liters   Follow-Up in: 3 months     If you have any questions or concerns before your next appointment please send [place] a message through mychart or call our office at [phone] Monday-Friday 8 am-5 pm.   If you have an urgent need after hours on the weekend please call your Primary Cardiologist or the [facility] in [place] at [phone].   At the [facility], you and your health needs are our priority. We have a designated team specialized in the treatment of Heart Failure. This Care Team includes your primary Heart Failure Specialized Cardiologist (physician), Advanced Practice Providers (APPs- Physician Assistants and Nurse Practitioners), and Pharmacist who all work together to provide you with the care you need, when you need it.   You may see any of the following providers on your designated Care Team at your next follow up:  Dr. Arvilla Meres Dr. Marca Ancona Dr. Dorthula Nettles Dr. Theresia Bough Tonye Becket, NP Robbie Lis, [place] [address] [place], [place] Brynda Peon, NP [place], NP Clarisa Kindred, NP Enos Fling, PharmD

## 2023-06-19 ENCOUNTER — Telehealth: Payer: Self-pay

## 2023-06-19 NOTE — Telephone Encounter (Signed)
Sleep study referral faxed to [facility] with clinical notes and patient's insurance information.

## 2023-06-23 ENCOUNTER — Other Ambulatory Visit: Payer: Self-pay | Admitting: Family

## 2023-06-23 DIAGNOSIS — F419 Anxiety disorder, unspecified: Secondary | ICD-10-CM

## 2023-07-02 ENCOUNTER — Ambulatory Visit: Payer: Medicare HMO | Admitting: Family

## 2023-07-03 ENCOUNTER — Other Ambulatory Visit: Payer: Self-pay

## 2023-07-05 ENCOUNTER — Other Ambulatory Visit: Payer: Self-pay | Admitting: Family

## 2023-07-05 MED ORDER — OXYCODONE HCL 5 MG PO TABS: 5.0000 mg | ORAL_TABLET | Freq: Three times a day (TID) | ORAL | 0 refills | Status: DC | PRN

## 2023-07-07 ENCOUNTER — Encounter: Payer: Self-pay | Admitting: Family

## 2023-07-07 DIAGNOSIS — E1165 Type 2 diabetes mellitus with hyperglycemia: Secondary | ICD-10-CM | POA: Insufficient documentation

## 2023-07-07 NOTE — Assessment & Plan Note (Signed)
Checking labs today.  Continue current therapy for lipid control. Will modify as needed based on labwork results.  

## 2023-07-07 NOTE — Assessment & Plan Note (Signed)
Patient stable.  Well controlled with current therapy.   Continue current meds.  

## 2023-07-07 NOTE — Assessment & Plan Note (Signed)
Continue current diabetes POC, as patient has been well controlled on current regimen.  Will adjust meds if needed based on labs.  

## 2023-07-10 ENCOUNTER — Ambulatory Visit: Payer: Medicare HMO | Admitting: Family

## 2023-07-10 ENCOUNTER — Encounter: Payer: Self-pay | Admitting: Family

## 2023-07-10 VITALS — BP 128/76 | HR 107 | Ht 73.0 in | Wt 241.0 lb

## 2023-07-10 DIAGNOSIS — R5383 Other fatigue: Secondary | ICD-10-CM | POA: Diagnosis not present

## 2023-07-10 DIAGNOSIS — Z79899 Other long term (current) drug therapy: Secondary | ICD-10-CM | POA: Diagnosis not present

## 2023-07-10 DIAGNOSIS — E538 Deficiency of other specified B group vitamins: Secondary | ICD-10-CM

## 2023-07-10 DIAGNOSIS — Z114 Encounter for screening for human immunodeficiency virus [HIV]: Secondary | ICD-10-CM

## 2023-07-10 DIAGNOSIS — Z0000 Encounter for general adult medical examination without abnormal findings: Secondary | ICD-10-CM

## 2023-07-10 DIAGNOSIS — E1165 Type 2 diabetes mellitus with hyperglycemia: Secondary | ICD-10-CM | POA: Diagnosis not present

## 2023-07-10 DIAGNOSIS — Z1211 Encounter for screening for malignant neoplasm of colon: Secondary | ICD-10-CM

## 2023-07-10 DIAGNOSIS — E291 Testicular hypofunction: Secondary | ICD-10-CM | POA: Diagnosis not present

## 2023-07-10 DIAGNOSIS — E782 Mixed hyperlipidemia: Secondary | ICD-10-CM | POA: Diagnosis not present

## 2023-07-10 DIAGNOSIS — Z1159 Encounter for screening for other viral diseases: Secondary | ICD-10-CM | POA: Diagnosis not present

## 2023-07-10 DIAGNOSIS — E559 Vitamin D deficiency, unspecified: Secondary | ICD-10-CM

## 2023-07-10 LAB — POC CREATINE & ALBUMIN,URINE
Creatinine, POC: 100 mg/dL
Microalbumin Ur, POC: 30 mg/L

## 2023-07-10 NOTE — Patient Instructions (Signed)
Health Maintenance, Male Adopting a healthy lifestyle and getting preventive care are important in promoting health and wellness. Ask your health care provider about: The right schedule for you to have regular tests and exams. Things you can do on your own to prevent diseases and keep yourself healthy. What should I know about diet, weight, and exercise? Eat a healthy diet  Eat a diet that includes plenty of vegetables, fruits, low-fat dairy products, and lean protein. Do not eat a lot of foods that are high in solid fats, added sugars, or sodium. Maintain a healthy weight Body mass index (BMI) is a measurement that can be used to identify possible weight problems. It estimates body fat based on height and weight. Your health care provider can help determine your BMI and help you achieve or maintain a healthy weight. Get regular exercise Get regular exercise. This is one of the most important things you can do for your health. Most adults should: Exercise for at least 150 minutes each week. The exercise should increase your heart rate and make you sweat (moderate-intensity exercise). Do strengthening exercises at least twice a week. This is in addition to the moderate-intensity exercise. Spend less time sitting. Even light physical activity can be beneficial. Watch cholesterol and blood lipids Have your blood tested for lipids and cholesterol at 70 years of age, then have this test every 5 years. You may need to have your cholesterol levels checked more often if: Your lipid or cholesterol levels are high. You are older than 70 years of age. You are at high risk for heart disease. What should I know about cancer screening? Many types of cancers can be detected early and may often be prevented. Depending on your health history and family history, you may need to have cancer screening at various ages. This may include screening for: Colorectal cancer. Prostate cancer. Skin cancer. Lung  cancer. What should I know about heart disease, diabetes, and high blood pressure? Blood pressure and heart disease High blood pressure causes heart disease and increases the risk of stroke. This is more likely to develop in people who have high blood pressure readings or are overweight. Talk with your health care provider about your target blood pressure readings. Have your blood pressure checked: Every 3-5 years if you are 18-39 years of age. Every year if you are 40 years old or older. If you are between the ages of 65 and 75 and are a current or former smoker, ask your health care provider if you should have a one-time screening for abdominal aortic aneurysm (AAA). Diabetes Have regular diabetes screenings. This checks your fasting blood sugar level. Have the screening done: Once every three years after age 45 if you are at a normal weight and have a low risk for diabetes. More often and at a younger age if you are overweight or have a high risk for diabetes. What should I know about preventing infection? Hepatitis B If you have a higher risk for hepatitis B, you should be screened for this virus. Talk with your health care provider to find out if you are at risk for hepatitis B infection. Hepatitis C Blood testing is recommended for: Everyone born from 1945 through 1965. Anyone with known risk factors for hepatitis C. Sexually transmitted infections (STIs) You should be screened each year for STIs, including gonorrhea and chlamydia, if: You are sexually active and are younger than 70 years of age. You are older than 70 years of age and your   health care provider tells you that you are at risk for this type of infection. Your sexual activity has changed since you were last screened, and you are at increased risk for chlamydia or gonorrhea. Ask your health care provider if you are at risk. Ask your health care provider about whether you are at high risk for HIV. Your health care provider  may recommend a prescription medicine to help prevent HIV infection. If you choose to take medicine to prevent HIV, you should first get tested for HIV. You should then be tested every 3 months for as long as you are taking the medicine. Follow these instructions at home: Alcohol use Do not drink alcohol if your health care provider tells you not to drink. If you drink alcohol: Limit how much you have to 0-2 drinks a day. Know how much alcohol is in your drink. In the [place] one drink equals one 12 oz bottle of beer (355 mL), one 5 oz glass of wine (148 mL), or one 1 oz glass of hard liquor (44 mL). Lifestyle Do not use any products that contain nicotine or tobacco. These products include cigarettes, chewing tobacco, and vaping devices, such as e-cigarettes. If you need help quitting, ask your health care provider. Do not use street drugs. Do not share needles. Ask your health care provider for help if you need support or information about quitting drugs. General instructions Schedule regular health, dental, and eye exams. Stay current with your vaccines. Tell your health care provider if: You often feel depressed. You have ever been abused or do not feel safe at home. Summary Adopting a healthy lifestyle and getting preventive care are important in promoting health and wellness. Follow your health care provider's instructions about healthy diet, exercising, and getting tested or screened for diseases. Follow your health care provider's instructions on monitoring your cholesterol and blood pressure. This information is not intended to replace advice given to you by your health care provider. Make sure you discuss any questions you have with your health care provider. Document Revised: 11/28/2020 Document Reviewed: 11/28/2020 Elsevier Patient Education  2024 [employer]  

## 2023-07-11 LAB — CBC WITH DIFFERENTIAL/PLATELET
Basophils Absolute: 0.1 10*3/uL (ref 0.0–0.2)
Basos: 1 %
EOS (ABSOLUTE): 0.4 10*3/uL (ref 0.0–0.4)
Eos: 4 %
Hematocrit: 54.1 % — ABNORMAL HIGH (ref 37.5–51.0)
Hemoglobin: 18.1 g/dL — ABNORMAL HIGH (ref 13.0–17.7)
Immature Grans (Abs): 0 10*3/uL (ref 0.0–0.1)
Immature Granulocytes: 0 %
Lymphocytes Absolute: 1.9 10*3/uL (ref 0.7–3.1)
Lymphs: 18 %
MCH: 30.5 pg (ref 26.6–33.0)
MCHC: 33.5 g/dL (ref 31.5–35.7)
MCV: 91 fL (ref 79–97)
Monocytes Absolute: 0.6 10*3/uL (ref 0.1–0.9)
Monocytes: 5 %
Neutrophils Absolute: 7.4 10*3/uL — ABNORMAL HIGH (ref 1.4–7.0)
Neutrophils: 72 %
Platelets: 249 10*3/uL (ref 150–450)
RBC: 5.94 x10E6/uL — ABNORMAL HIGH (ref 4.14–5.80)
RDW: 12.3 % (ref 11.6–15.4)
WBC: 10.3 10*3/uL (ref 3.4–10.8)

## 2023-07-11 LAB — CMP14+EGFR
ALT: 12 [IU]/L (ref 0–44)
AST: 17 [IU]/L (ref 0–40)
Albumin: 4.6 g/dL (ref 3.9–4.9)
Alkaline Phosphatase: 84 [IU]/L (ref 44–121)
BUN/Creatinine Ratio: 7 — ABNORMAL LOW (ref 10–24)
BUN: 7 mg/dL — ABNORMAL LOW (ref 8–27)
Bilirubin Total: 0.7 mg/dL (ref 0.0–1.2)
CO2: 23 mmol/L (ref 20–29)
Calcium: 9.4 mg/dL (ref 8.6–10.2)
Chloride: 97 mmol/L (ref 96–106)
Creatinine, Ser: 0.95 mg/dL (ref 0.76–1.27)
Globulin, Total: 2.6 g/dL (ref 1.5–4.5)
Glucose: 136 mg/dL — ABNORMAL HIGH (ref 70–99)
Potassium: 4.5 mmol/L (ref 3.5–5.2)
Sodium: 137 mmol/L (ref 134–144)
Total Protein: 7.2 g/dL (ref 6.0–8.5)
eGFR: 86 mL/min/{1.73_m2} (ref >59)

## 2023-07-11 LAB — HEMOGLOBIN A1C
Est. average glucose Bld gHb Est-mCnc: 131 mg/dL
Hgb A1c MFr Bld: 6.2 % — ABNORMAL HIGH (ref 4.8–5.6)

## 2023-07-11 LAB — VITAMIN B12: Vitamin B-12: >2000 pg/mL — ABNORMAL HIGH (ref 232–1245)

## 2023-07-11 LAB — TSH: TSH: 1.69 u[IU]/mL (ref 0.450–4.500)

## 2023-07-11 LAB — VITAMIN D 25 HYDROXY (VIT D DEFICIENCY, FRACTURES): Vit D, 25-Hydroxy: 67.8 ng/mL (ref 30.0–100.0)

## 2023-07-11 LAB — HCV AB W REFLEX TO QUANT PCR: HCV Ab: NONREACTIVE

## 2023-07-11 LAB — HIV ANTIBODY (ROUTINE TESTING W REFLEX): HIV Screen 4th Generation wRfx: NONREACTIVE

## 2023-07-11 LAB — HCV INTERPRETATION

## 2023-07-13 LAB — TOXASSURE SELECT 16, UR

## 2023-07-21 ENCOUNTER — Encounter: Payer: Self-pay | Admitting: Family

## 2023-07-21 NOTE — Progress Notes (Signed)
Annual Wellness Visit  Patient: Lance House, Male    DOB: Nov 07, 1950, 72 y.o.   MRN: [id number] Visit Date: 07/21/2023  Today's Provider: Miki Kins, FNP  Subjective:    Chief Complaint  Patient presents with   Annual Exam    AWV   Link Lance House is a 72 y.o. male who presents today for his Annual Wellness Visit.   Past Medical History:  Diagnosis Date   Abdominal pain 04/08/2019   Anxiety    Anxiety hyperventilation 05/08/2019   Closed compression fracture of L4 vertebra (HCC) 08/18/2021   Closed fracture of distal fibula 02/13/2021   Compression fracture of thoracic vertebra (HCC) 08/18/2021   Depression    Elevated troponin    Gout    Hypokalemia 08/16/2021   Hyponatremia 08/16/2021   ICH (intracerebral hemorrhage) (HCC) 08/15/2021   Incarcerated left inguinal hernia    Injury of right ankle 02/13/2021   Left flank pain 02/03/2013   Other fatigue 10/25/2022   Pain    Pressure injury of skin 09/11/2021   Rhabdomyolysis 08/16/2021   Past Surgical History:  Procedure Laterality Date   APPENDECTOMY  1994   INGUINAL HERNIA REPAIR Left 04/13/2019   Procedure: HERNIA REPAIR INGUINAL ADULT OPEN, LEFT;  Surgeon: Duanne Guess, MD;  Location: [facility] ORS;  Service: General;  Laterality: Left;   NASAL SINUS SURGERY  2015   unc   RIGHT HEART CATH Right 04/26/2023   Procedure: RIGHT HEART CATH;  Surgeon: Laurey Morale, MD;  Location: [facility] INVASIVE CV LAB;  Service: Cardiovascular;  Laterality: Right;   VIDEO ASSISTED THORACOSCOPY (VATS)/DECORTICATION Left 09/11/2021   Procedure: VIDEO ASSISTED THORACOSCOPY (VATS)/DECORTICATION;  Surgeon: Loreli Slot, MD;  Location: [facility] OR;  Service: Thoracic;  Laterality: Left;   Family History  Problem Relation Age of Onset   Skin cancer Father    Multiple sclerosis Sister    Non-Hodgkin's lymphoma Brother    Social History   Socioeconomic History   Marital status: Married    Spouse name: Not on file   Number of  children: Not on file   Years of education: Not on file   Highest education level: Not on file  Occupational History   Not on file  Tobacco Use   Smoking status: Every Day    Current packs/day: 1.00    Types: Cigarettes   Smokeless tobacco: Never  Vaping Use   Vaping status: Never Used  Substance and Sexual Activity   Alcohol use: Not Currently    Alcohol/week: 42.0 standard drinks of alcohol    Types: 42 Cans of beer per week    Comment: quit around 05/2021   Drug use: Not Currently   Sexual activity: Not on file  Other Topics Concern   Not on file  Social History Narrative   Lives with wife, Lance House. No indoor pets.   Social Drivers of Corporate investment banker Strain: Low Risk  (07/10/2023)   Overall Financial Resource Strain (CARDIA)    Difficulty of Paying Living Expenses: Not very hard  Food Insecurity: No Food Insecurity (07/10/2023)   Hunger Vital Sign    Worried About Running Out of Food in the Last Year: Never true    Ran Out of Food in the Last Year: Never true  Transportation Needs: No Transportation Needs (07/10/2023)   PRAPARE - Administrator, Civil Service (Medical): No    Lack of Transportation (Non-Medical): No  Physical Activity: Sufficiently Active (07/10/2023)   Exercise Vital  Sign    Days of Exercise per Week: 3 days    Minutes of Exercise per Session: 80 min  Stress: No Stress Concern Present (07/10/2023)   [employer] of Occupational Health - Occupational Stress Questionnaire    Feeling of Stress : Not at all  Social Connections: Socially Isolated (07/10/2023)   Social Connection and Isolation Panel [NHANES]    Frequency of Communication with Friends and Family: More than three times a week    Frequency of Social Gatherings with Friends and Family: Once a week    Attends Religious Services: Never    Database administrator or Organizations: No    Attends Banker Meetings: Never    Marital Status: Separated   Intimate Partner Violence: Not At Risk (07/10/2023)   Humiliation, Afraid, Rape, and Kick questionnaire    Fear of Current or Ex-Partner: No    Emotionally Abused: No    Physically Abused: No    Sexually Abused: No    Medications: Outpatient Medications Prior to Visit  Medication Sig   Cholecalciferol (VITAMIN D3) 125 MCG (5000 UT) TABS Take 1 tablet by mouth daily.   clonazePAM (KLONOPIN) 1 MG tablet Take 1 tablet by mouth twice daily   folic acid (FOLVITE) 1 MG tablet Take 1 tablet (1 mg total) by mouth daily.   gabapentin (NEURONTIN) 400 MG capsule TAKE 1 CAPSULE BY MOUTH THREE TIMES DAILY (Patient taking differently: Take 400 mg by mouth 2 (two) times daily.)   hydrOXYzine (ATARAX) 25 MG tablet Take 1 tablet (25 mg total) by mouth every 8 (eight) hours as needed for anxiety.   Methylcobalamin (B12 SL) Place 5,000 mcg under the tongue daily. Patient doesn't know dosage   oxyCODONE (OXY IR/ROXICODONE) 5 MG immediate release tablet Take 1 tablet (5 mg total) by mouth 3 (three) times daily as needed for severe pain (pain score 7-10).   pyridOXINE (VITAMIN B6) 100 MG tablet Take 100 mg by mouth daily.   [DISCONTINUED] rosuvastatin (CRESTOR) 10 MG tablet Take 1 tablet (10 mg total) by mouth daily.   No facility-administered medications prior to visit.    Allergies  Allergen Reactions   Ciprocin-Fluocin-Procin [Fluocinolone] Anaphylaxis    Hip hurt   Ciprofloxacin Other (See Comments)    Unknown reaction   Colchicine Other (See Comments)   Ezetimibe Nausea And Vomiting   Farxiga [Dapagliflozin] Diarrhea    dizziness   Fentanyl     Couldn't control hisself   Meloxicam Diarrhea and Swelling   Duloxetine Diarrhea, Nausea And Vomiting and Other (See Comments)    Altered mental status    Patient Care Team: Miki Kins, FNP as PCP - General (Family Medicine)  Review of Systems  Musculoskeletal:  Positive for back pain and gait problem.  All other systems reviewed and are  negative.      Objective:    Vitals: BP 128/76   Pulse (!) 107   Ht 6\' 1"  (1.854 m)   Wt 241 lb (109.3 kg)   SpO2 94%   BMI 31.80 kg/m    Physical Exam Vitals and nursing note reviewed.  Constitutional:      Appearance: Normal appearance. He is normal weight.  Eyes:     Pupils: Pupils are equal, round, and reactive to light.  Cardiovascular:     Rate and Rhythm: Normal rate and regular rhythm.     Pulses: Normal pulses.     Heart sounds: Normal heart sounds.  Pulmonary:     Effort:  Pulmonary effort is normal.     Breath sounds: Normal breath sounds.  Musculoskeletal:     Cervical back: Tenderness present.     Lumbar back: Spasms and tenderness present. Decreased range of motion. Positive right straight leg raise test and positive left straight leg raise test.  Neurological:     Mental Status: He is alert.     Motor: Weakness present.     Gait: Gait abnormal.  Psychiatric:        Mood and Affect: Mood normal.        Behavior: Behavior normal.        Thought Content: Thought content normal.        Judgment: Judgment normal.     Most recent functional status assessment:    07/10/2023    2:21 PM  In your present state of health, do you have any difficulty performing the following activities:  Hearing? 0  Vision? 0  Difficulty concentrating or making decisions? 0  Walking or climbing stairs? 1  Dressing or bathing? 0  Doing errands, shopping? 0  Preparing Food and eating ? N  Using the Toilet? N  In the past six months, have you accidently leaked urine? N  Do you have problems with loss of bowel control? N  Managing your Medications? N  Managing your Finances? N  Housekeeping or managing your Housekeeping? N    Most recent fall risk assessment:    07/10/2023    2:26 PM  Fall Risk   Falls in the past year? 1  Number falls in past yr: 1  Injury with Fall? 1  Risk for fall due to : History of fall(s);Impaired balance/gait;Orthopedic patient;Impaired  mobility;Medication side effect  Follow up Falls evaluation completed;Education provided;Falls prevention discussed     Most recent depression screenings:    07/10/2023    2:25 PM  PHQ 2/9 Scores  PHQ - 2 Score 2  PHQ- 9 Score 4    Most recent cognitive screening:    07/10/2023    2:27 PM  6CIT Screen  What Year? 0 points  What month? 0 points  What time? 0 points  Count back from 20 0 points  Months in reverse 0 points  Repeat phrase 0 points  Total Score 0 points    Results for orders placed or performed in visit on 07/10/23  VITAMIN D 25 Hydroxy (Vit-D Deficiency, Fractures)  Result Value Ref Range   Vit D, 25-Hydroxy 67.8 30.0 - 100.0 ng/mL  CMP14+EGFR  Result Value Ref Range   Glucose 136 (H) 70 - 99 mg/dL   BUN 7 (L) 8 - 27 mg/dL   Creatinine, Ser 1.61 0.76 - 1.27 mg/dL   eGFR 86 >09 UE/AVW/0.98   BUN/Creatinine Ratio 7 (L) 10 - 24   Sodium 137 134 - 144 mmol/L   Potassium 4.5 3.5 - 5.2 mmol/L   Chloride 97 96 - 106 mmol/L   CO2 23 20 - 29 mmol/L   Calcium 9.4 8.6 - 10.2 mg/dL   Total Protein 7.2 6.0 - 8.5 g/dL   Albumin 4.6 3.9 - 4.9 g/dL   Globulin, Total 2.6 1.5 - 4.5 g/dL   Bilirubin Total 0.7 0.0 - 1.2 mg/dL   Alkaline Phosphatase 84 44 - 121 IU/L   AST 17 0 - 40 IU/L   ALT 12 0 - 44 IU/L  TSH  Result Value Ref Range   TSH 1.690 0.450 - 4.500 uIU/mL  Hemoglobin A1c  Result Value Ref Range  Hgb A1c MFr Bld 6.2 (H) 4.8 - 5.6 %   Est. average glucose Bld gHb Est-mCnc 131 mg/dL  Vitamin U04  Result Value Ref Range   Vitamin B-12 >2000 (H) 232 - 1245 pg/mL  CBC with Diff  Result Value Ref Range   WBC 10.3 3.4 - 10.8 x10E3/uL   RBC 5.94 (H) 4.14 - 5.80 x10E6/uL   Hemoglobin 18.1 (H) 13.0 - 17.7 g/dL   Hematocrit 54.0 (H) 98.1 - 51.0 %   MCV 91 79 - 97 fL   MCH 30.5 26.6 - 33.0 pg   MCHC 33.5 31.5 - 35.7 g/dL   RDW 19.1 47.8 - 29.5 %   Platelets 249 150 - 450 x10E3/uL   Neutrophils 72 Not Estab. %   Lymphs 18 Not Estab. %   Monocytes 5 Not  Estab. %   Eos 4 Not Estab. %   Basos 1 Not Estab. %   Neutrophils Absolute 7.4 (H) 1.4 - 7.0 x10E3/uL   Lymphocytes Absolute 1.9 0.7 - 3.1 x10E3/uL   Monocytes Absolute 0.6 0.1 - 0.9 x10E3/uL   EOS (ABSOLUTE) 0.4 0.0 - 0.4 x10E3/uL   Basophils Absolute 0.1 0.0 - 0.2 x10E3/uL   Immature Granulocytes 0 Not Estab. %   Immature Grans (Abs) 0.0 0.0 - 0.1 x10E3/uL  Hepatitis C Ab reflex to Quant PCR  Result Value Ref Range   HCV Ab Non Reactive Non Reactive  HIV antibody (with reflex)  Result Value Ref Range   HIV Screen 4th Generation wRfx Non Reactive Non Reactive  ToxASSURE Select 16, UR  Result Value Ref Range   Summary FINAL   Interpretation:  Result Value Ref Range   HCV Interp 1: Comment   POC CREATINE & ALBUMIN,URINE  Result Value Ref Range   Microalbumin Ur, POC 30 mg/L   Creatinine, POC 100 mg/dL   Albumin/Creatinine Ratio, Urine, POC 30-300        Assessment & Plan:      Annual wellness visit done today including the all of the following: Reviewed patient's Family Medical History Reviewed and updated list of patient's medical providers Assessment of cognitive impairment was done Assessed patient's functional ability Established a written schedule for health screening services Health Risk Assessent Completed and Reviewed  Exercise Activities and Dietary recommendations  Goals      Absence of Fall and Fall-Related Injury     Evidence-based guidance:  Assess fall risk using a validated tool when available. Consider balance and gait impairment, muscle weakness, diminished vision or hearing, environmental hazards, presence of urinary or bowel urgency and/or incontinence.  Communicate fall injury risk to interprofessional healthcare team.  Develop a fall prevention plan with the patient and family.  Promote use of personal vision and auditory aids.  Promote reorientation, appropriate sensory stimulation, and routines to decrease risk of fall when changes in mental  status are present.  Assess assistance level required for safe and effective self-care; consider referral for home care.  Encourage physical activity, such as performance of self-care at highest level of ability, strength and balance exercise program, and provision of appropriate assistive devices; refer to rehabilitation therapy.  Refer to community-based fall prevention program where available.  If fall occurs, determine the cause and revise fall injury prevention plan.  Regularly review medication contribution to fall risk; consider risk related to polypharmacy and age.  Refer to pharmacist for consultation when concerns about medications are revealed.  Balance adequate pain management with potential for oversedation.  Provide guidance related to environmental  modifications.  Consider supplementation with Vitamin D.   Notes:      Tobacco Use Managed     Evidence-based guidance:  Identify tobacco use status including cigarette, cigar, roll-your-own, dissolvable, hookah or smokeless tobacco, nicotine gels, vapes, e-cigarettes or other electronic delivery systems.  Identify the patient's willingness to quit or cut down.   Intervene using the 5A's technique:  Ask: Identify and document tobacco use status for every patient at every visit.  Assess willingness to make a quit attempt now.  Advise: In a clear, strong and personalized manner, urge every tobacco user to quit.  Assist: For the patient willing to make a quit attempt, use counseling and pharmacologic therapy (nicotine replacement, antidepressant, nicotinic receptor partial agonist) to help quit.  Arrange: Schedule follow-up contact, in person or by telephone, preferably within the first week after the quit date.   Notes:          There is no immunization history on file for this patient.  Health Maintenance  Topic Date Due   Medicare Annual Wellness (AWV)  Never done   COVID-19 Vaccine (1) Never done   Pneumonia Vaccine  40+ Years old (1 of 2 - PCV) Never done   FOOT EXAM  Never done   OPHTHALMOLOGY EXAM  Never done   DTaP/Tdap/Td (1 - Tdap) Never done   Zoster Vaccines- Shingrix (1 of 2) Never done   Fecal DNA (Cologuard)  01/12/2023   HEMOGLOBIN A1C  01/08/2024   Diabetic kidney evaluation - eGFR measurement  07/09/2024   Diabetic kidney evaluation - Urine ACR  07/09/2024   Hepatitis C Screening  Completed   HPV VACCINES  Aged Out   INFLUENZA VACCINE  Discontinued     Discussed health benefits of physical activity, and encouraged him to engage in regular exercise appropriate for his age and condition.         Miki Kins, FNP   07/10/2023  This document may have been prepared by Dragon Voice Recognition software and as such may include unintentional dictation errors.

## 2023-07-31 ENCOUNTER — Other Ambulatory Visit: Payer: Self-pay | Admitting: Family

## 2023-08-02 ENCOUNTER — Other Ambulatory Visit: Payer: Self-pay | Admitting: Family

## 2023-08-02 MED ORDER — OXYCODONE HCL 5 MG PO TABS: 5.0000 mg | ORAL_TABLET | Freq: Three times a day (TID) | ORAL | 0 refills | Status: DC | PRN

## 2023-08-09 DIAGNOSIS — Z1211 Encounter for screening for malignant neoplasm of colon: Secondary | ICD-10-CM | POA: Diagnosis not present

## 2023-08-11 ENCOUNTER — Other Ambulatory Visit: Payer: Self-pay | Admitting: Family

## 2023-08-16 LAB — COLOGUARD: COLOGUARD: POSITIVE — AB

## 2023-08-19 ENCOUNTER — Other Ambulatory Visit: Payer: Self-pay | Admitting: Family

## 2023-08-19 DIAGNOSIS — F419 Anxiety disorder, unspecified: Secondary | ICD-10-CM

## 2023-08-21 ENCOUNTER — Telehealth: Payer: Self-pay

## 2023-08-21 MED ORDER — CLONAZEPAM 1 MG PO TABS: 1.0000 mg | ORAL_TABLET | Freq: Two times a day (BID) | ORAL | 1 refills | Status: DC

## 2023-08-21 NOTE — Telephone Encounter (Signed)
Pt stated that his BP has been running extremely high the 178/105 is the highest and then it was 148/90- pt stated that he did take one of his wifes amlodipine and it lowered it- please advise, would you like to sent him in something or would you like for him to switch his apt to sooner- pt took amlodipine this morning at 4:30- BP is, 125/85

## 2023-08-22 ENCOUNTER — Telehealth: Payer: Self-pay

## 2023-08-22 ENCOUNTER — Other Ambulatory Visit: Payer: Self-pay

## 2023-08-22 MED ORDER — AMLODIPINE BESYLATE 5 MG PO TABS: 5.0000 mg | ORAL_TABLET | Freq: Every day | ORAL | 0 refills | Status: DC

## 2023-08-22 NOTE — Progress Notes (Signed)
Pt is asking for BP meds. Has taken two of his wife's Amlodipine 10 MG pills today because he states his BP was running high at 170/100-70bmp, 160/100-70bpm, and after taking 1st dose of Amlodipine came down to 144/98. After 2nd dose, it is 120/82-88 bpm.   Pt needs to come in to be evaluated first before blindly sending a prescription.  Pt also called his primary care for this same reason. His primacy care sent in Amlodipine 5 MG. We will continue to let his PCP manage this prescription.  No further action needed.

## 2023-08-25 ENCOUNTER — Encounter: Payer: Self-pay | Admitting: Family

## 2023-08-25 DIAGNOSIS — R195 Other fecal abnormalities: Secondary | ICD-10-CM

## 2023-08-27 ENCOUNTER — Other Ambulatory Visit: Payer: Self-pay | Admitting: Family

## 2023-08-28 ENCOUNTER — Other Ambulatory Visit: Payer: Self-pay | Admitting: Family

## 2023-08-31 MED ORDER — OXYCODONE HCL 5 MG PO TABS: 5.0000 mg | ORAL_TABLET | Freq: Three times a day (TID) | ORAL | 0 refills | Status: DC | PRN

## 2023-09-03 ENCOUNTER — Telehealth: Payer: Self-pay

## 2023-09-03 ENCOUNTER — Other Ambulatory Visit (HOSPITAL_COMMUNITY): Payer: Self-pay

## 2023-09-03 NOTE — Telephone Encounter (Signed)
Pt return your call would like a call back/ Per pt he wants to have procedure but will need to schedule it out so he can make sure he has a ride.

## 2023-09-03 NOTE — Telephone Encounter (Signed)
Message left for patient to return my call.

## 2023-09-04 NOTE — Telephone Encounter (Signed)
Message left for patient to return my call.

## 2023-09-05 ENCOUNTER — Ambulatory Visit: Payer: Medicare HMO | Admitting: Family

## 2023-09-09 ENCOUNTER — Encounter: Payer: Self-pay | Admitting: *Deleted

## 2023-09-11 ENCOUNTER — Ambulatory Visit: Payer: Medicare HMO | Admitting: Family

## 2023-09-20 ENCOUNTER — Other Ambulatory Visit: Payer: Self-pay | Admitting: Family

## 2023-09-23 ENCOUNTER — Other Ambulatory Visit: Payer: Self-pay | Admitting: Family

## 2023-09-23 MED ORDER — HYDROXYZINE HCL 25 MG PO TABS: 25.0000 mg | ORAL_TABLET | Freq: Three times a day (TID) | ORAL | 0 refills | Status: DC | PRN

## 2023-09-24 ENCOUNTER — Encounter: Payer: Medicare HMO | Admitting: Family

## 2023-09-26 ENCOUNTER — Other Ambulatory Visit: Payer: Self-pay | Admitting: Family

## 2023-09-26 NOTE — Telephone Encounter (Signed)
 PT LM asking for his pain medication to be sent in states he sent a request via mychart as well

## 2023-09-27 ENCOUNTER — Other Ambulatory Visit: Payer: Self-pay

## 2023-09-27 MED ORDER — OXYCODONE HCL 5 MG PO TABS: 5.0000 mg | ORAL_TABLET | Freq: Three times a day (TID) | ORAL | 0 refills | Status: AC | PRN

## 2023-09-27 MED ORDER — OXYCODONE HCL 5 MG PO TABS: 5.0000 mg | ORAL_TABLET | Freq: Three times a day (TID) | ORAL | 0 refills | Status: DC | PRN

## 2023-10-16 ENCOUNTER — Other Ambulatory Visit: Payer: Self-pay | Admitting: Family

## 2023-10-16 DIAGNOSIS — F419 Anxiety disorder, unspecified: Secondary | ICD-10-CM

## 2023-10-28 ENCOUNTER — Encounter: Payer: Self-pay | Admitting: Family

## 2023-10-28 ENCOUNTER — Ambulatory Visit (INDEPENDENT_AMBULATORY_CARE_PROVIDER_SITE_OTHER): Admitting: Family

## 2023-10-28 VITALS — BP 112/76 | HR 96 | Ht 73.0 in | Wt 246.4 lb

## 2023-10-28 DIAGNOSIS — J449 Chronic obstructive pulmonary disease, unspecified: Secondary | ICD-10-CM

## 2023-10-28 DIAGNOSIS — E782 Mixed hyperlipidemia: Secondary | ICD-10-CM | POA: Diagnosis not present

## 2023-10-28 DIAGNOSIS — E559 Vitamin D deficiency, unspecified: Secondary | ICD-10-CM

## 2023-10-28 DIAGNOSIS — E538 Deficiency of other specified B group vitamins: Secondary | ICD-10-CM | POA: Diagnosis not present

## 2023-10-28 DIAGNOSIS — M5442 Lumbago with sciatica, left side: Secondary | ICD-10-CM | POA: Diagnosis not present

## 2023-10-28 DIAGNOSIS — L602 Onychogryphosis: Secondary | ICD-10-CM

## 2023-10-28 DIAGNOSIS — E1165 Type 2 diabetes mellitus with hyperglycemia: Secondary | ICD-10-CM

## 2023-10-28 DIAGNOSIS — R5383 Other fatigue: Secondary | ICD-10-CM

## 2023-10-28 DIAGNOSIS — G8929 Other chronic pain: Secondary | ICD-10-CM | POA: Diagnosis not present

## 2023-10-28 DIAGNOSIS — I5032 Chronic diastolic (congestive) heart failure: Secondary | ICD-10-CM | POA: Diagnosis not present

## 2023-10-28 DIAGNOSIS — M5441 Lumbago with sciatica, right side: Secondary | ICD-10-CM

## 2023-10-28 DIAGNOSIS — Z0130 Encounter for examination of blood pressure without abnormal findings: Secondary | ICD-10-CM

## 2023-10-28 NOTE — Progress Notes (Signed)
 Established Patient Office Visit  Subjective:  Patient ID: Lance House, male    DOB: 09-Jun-1951  Age: 73 y.o. MRN: [id number]  Chief Complaint  Patient presents with   Follow-up    Left foot numb    Patient is here today for his 3 months follow up.  He has been feeling fairly well since last appointment.   He does have additional concerns to discuss today.  1) Labs from last time - A1C and B12.  2) Asks if we can try viagra to see if it will help with his circulation.    Labs are due today. He needs refills.   I have reviewed his active problem list, medication list, allergies, notes from last encounter, lab results for his appointment today.      No other concerns at this time.   Past Medical History:  Diagnosis Date   Abdominal pain 04/08/2019   Anxiety    Anxiety hyperventilation 05/08/2019   Closed compression fracture of L4 vertebra (HCC) 08/18/2021   Closed fracture of distal fibula 02/13/2021   Compression fracture of thoracic vertebra (HCC) 08/18/2021   Depression    Elevated troponin    Gout    Hypokalemia 08/16/2021   Hyponatremia 08/16/2021   ICH (intracerebral hemorrhage) (HCC) 08/15/2021   Incarcerated left inguinal hernia    Injury of right ankle 02/13/2021   Left flank pain 02/03/2013   Other fatigue 10/25/2022   Pain    Pressure injury of skin 09/11/2021   Rhabdomyolysis 08/16/2021    Past Surgical History:  Procedure Laterality Date   APPENDECTOMY  1994   INGUINAL HERNIA REPAIR Left 04/13/2019   Procedure: HERNIA REPAIR INGUINAL ADULT OPEN, LEFT;  Surgeon: Mercy Stall, MD;  Location: [facility] ORS;  Service: General;  Laterality: Left;   NASAL SINUS SURGERY  2015   unc   RIGHT HEART CATH Right 04/26/2023   Procedure: RIGHT HEART CATH;  Surgeon: Darlis Eisenmenger, MD;  Location: [facility] INVASIVE CV LAB;  Service: Cardiovascular;  Laterality: Right;   VIDEO ASSISTED THORACOSCOPY (VATS)/DECORTICATION Left 09/11/2021   Procedure: VIDEO ASSISTED  THORACOSCOPY (VATS)/DECORTICATION;  Surgeon: Zelphia Higashi, MD;  Location: [facility] OR;  Service: Thoracic;  Laterality: Left;    Social History   Socioeconomic History   Marital status: Married    Spouse name: Not on file   Number of children: Not on file   Years of education: Not on file   Highest education level: Not on file  Occupational History   Not on file  Tobacco Use   Smoking status: Every Day    Current packs/day: 1.00    Types: Cigarettes   Smokeless tobacco: Never  Vaping Use   Vaping status: Never Used  Substance and Sexual Activity   Alcohol use: Not Currently    Alcohol/week: 42.0 standard drinks of alcohol    Types: 42 Cans of beer per week    Comment: quit around 05/2021   Drug use: Not Currently   Sexual activity: Not on file  Other Topics Concern   Not on file  Social History Narrative   Lives with wife, Lance House. No indoor pets.   Social Drivers of Corporate investment banker Strain: Low Risk  (07/10/2023)   Overall Financial Resource Strain (CARDIA)    Difficulty of Paying Living Expenses: Not very hard  Food Insecurity: No Food Insecurity (07/10/2023)   Hunger Vital Sign    Worried About Running Out of Food in the Last Year: Never true  Ran Out of Food in the Last Year: Never true  Transportation Needs: No Transportation Needs (07/10/2023)   PRAPARE - Administrator, Civil Service (Medical): No    Lack of Transportation (Non-Medical): No  Physical Activity: Sufficiently Active (07/10/2023)   Exercise Vital Sign    Days of Exercise per Week: 3 days    Minutes of Exercise per Session: 80 min  Stress: No Stress Concern Present (07/10/2023)   [employer] of Occupational Health - Occupational Stress Questionnaire    Feeling of Stress : Not at all  Social Connections: Socially Isolated (07/10/2023)   Social Connection and Isolation Panel [NHANES]    Frequency of Communication with Friends and Family: More than three times a week     Frequency of Social Gatherings with Friends and Family: Once a week    Attends Religious Services: Never    Database administrator or Organizations: No    Attends Banker Meetings: Never    Marital Status: Separated  Intimate Partner Violence: Not At Risk (07/10/2023)   Humiliation, Afraid, Rape, and Kick questionnaire    Fear of Current or Ex-Partner: No    Emotionally Abused: No    Physically Abused: No    Sexually Abused: No    Family History  Problem Relation Age of Onset   Skin cancer Father    Multiple sclerosis Sister    Non-Hodgkin's lymphoma Brother     Allergies  Allergen Reactions   Ciprocin-Fluocin-Procin [Fluocinolone] Anaphylaxis    Hip hurt   Ciprofloxacin Other (See Comments)    Unknown reaction   Colchicine  Other (See Comments)   Ezetimibe Nausea And Vomiting   Farxiga  [Dapagliflozin ] Diarrhea    dizziness   Fentanyl      Couldn't control hisself   Meloxicam  Diarrhea and Swelling   Duloxetine Diarrhea, Nausea And Vomiting and Other (See Comments)    Altered mental status    Review of Systems  Constitutional:  Positive for malaise/fatigue.  Musculoskeletal:  Positive for back pain and joint pain.  Neurological:  Positive for tingling and sensory change.  All other systems reviewed and are negative.      Objective:   BP 112/76   Pulse 96   Ht 6\' 1"  (1.854 m)   Wt 246 lb 6.4 oz (111.8 kg)   SpO2 92%   BMI 32.51 kg/m   Vitals:   10/28/23 1123  BP: 112/76  Pulse: 96  Height: 6\' 1"  (1.854 m)  Weight: 246 lb 6.4 oz (111.8 kg)  SpO2: 92%  BMI (Calculated): 32.52    Physical Exam Vitals and nursing note reviewed.  Constitutional:      Appearance: Normal appearance. He is normal weight.  Eyes:     Pupils: Pupils are equal, round, and reactive to light.  Cardiovascular:     Rate and Rhythm: Normal rate and regular rhythm.     Pulses: Normal pulses.     Heart sounds: Normal heart sounds.  Pulmonary:     Effort:  Pulmonary effort is normal.     Breath sounds: Normal breath sounds.  Musculoskeletal:     Lumbar back: Positive right straight leg raise test and positive left straight leg raise test.  Neurological:     General: No focal deficit present.     Mental Status: He is alert and oriented to person, place, and time. Mental status is at baseline.  Psychiatric:        Mood and Affect: Mood normal.  Behavior: Behavior normal.        Thought Content: Thought content normal.        Judgment: Judgment normal.      Results for orders placed or performed in visit on 10/28/23  Lipid panel  Result Value Ref Range   Cholesterol, Total 231 (H) 100 - 199 mg/dL   Triglycerides 161 0 - 149 mg/dL   HDL 34 (L) >09 mg/dL   VLDL Cholesterol Cal 22 5 - 40 mg/dL   LDL Chol Calc (NIH) 604 (H) 0 - 99 mg/dL   Chol/HDL Ratio 6.8 (H) 0.0 - 5.0 ratio  VITAMIN D  25 Hydroxy (Vit-D Deficiency, Fractures)  Result Value Ref Range   Vit D, 25-Hydroxy 53.0 30.0 - 100.0 ng/mL  CMP14+EGFR  Result Value Ref Range   Glucose 100 (H) 70 - 99 mg/dL   BUN 5 (L) 8 - 27 mg/dL   Creatinine, Ser 5.40 0.76 - 1.27 mg/dL   eGFR 95 >98 JX/BJY/7.82   BUN/Creatinine Ratio 6 (L) 10 - 24   Sodium 136 134 - 144 mmol/L   Potassium 4.3 3.5 - 5.2 mmol/L   Chloride 100 96 - 106 mmol/L   CO2 23 20 - 29 mmol/L   Calcium  9.3 8.6 - 10.2 mg/dL   Total Protein 6.6 6.0 - 8.5 g/dL   Albumin  4.6 3.9 - 4.9 g/dL   Globulin, Total 2.0 1.5 - 4.5 g/dL   Bilirubin Total 0.8 0.0 - 1.2 mg/dL   Alkaline Phosphatase 71 44 - 121 IU/L   AST 17 0 - 40 IU/L   ALT 12 0 - 44 IU/L  TSH  Result Value Ref Range   TSH 2.140 0.450 - 4.500 uIU/mL  Hemoglobin A1c  Result Value Ref Range   Hgb A1c MFr Bld 6.3 (H) 4.8 - 5.6 %   Est. average glucose Bld gHb Est-mCnc 134 mg/dL  Vitamin B12  Result Value Ref Range   Vitamin B-12 1,683 (H) 232 - 1,245 pg/mL    Recent Results (from the past 2160 hours)  Lipid panel     Status: Abnormal   Collection  Time: 10/28/23 12:06 PM  Result Value Ref Range   Cholesterol, Total 231 (H) 100 - 199 mg/dL   Triglycerides 956 0 - 149 mg/dL   HDL 34 (L) >21 mg/dL   VLDL Cholesterol Cal 22 5 - 40 mg/dL   LDL Chol Calc (NIH) 308 (H) 0 - 99 mg/dL   Chol/HDL Ratio 6.8 (H) 0.0 - 5.0 ratio    Comment:                                   T. Chol/HDL Ratio                                             Men  Women                               1/2 Avg.Risk  3.4    3.3                                   Avg.Risk  5.0    4.4  2X Avg.Risk  9.6    7.1                                3X Avg.Risk 23.4   11.0   VITAMIN D  25 Hydroxy (Vit-D Deficiency, Fractures)     Status: None   Collection Time: 10/28/23 12:06 PM  Result Value Ref Range   Vit D, 25-Hydroxy 53.0 30.0 - 100.0 ng/mL    Comment: Vitamin D  deficiency has been defined by the Institute of Medicine and an Endocrine Society practice guideline as a level of serum 25-OH vitamin D  less than 20 ng/mL (1,2). The Endocrine Society went on to further define vitamin D  insufficiency as a level between 21 and 29 ng/mL (2). 1. IOM (Institute of Medicine). 2010. Dietary reference    intakes for calcium  and D. [place]: The    [employer]. 2. Holick MF, Binkley , Bischoff-Ferrari HA, et al.    Evaluation, treatment, and prevention of vitamin D     deficiency: an Endocrine Society clinical practice    guideline. JCEM. 2011 Jul; 96(7):1911-30.   CMP14+EGFR     Status: Abnormal   Collection Time: 10/28/23 12:06 PM  Result Value Ref Range   Glucose 100 (H) 70 - 99 mg/dL   BUN 5 (L) 8 - 27 mg/dL   Creatinine, Ser 1.61 0.76 - 1.27 mg/dL   eGFR 95 >09 UE/AVW/0.98   BUN/Creatinine Ratio 6 (L) 10 - 24   Sodium 136 134 - 144 mmol/L   Potassium 4.3 3.5 - 5.2 mmol/L   Chloride 100 96 - 106 mmol/L   CO2 23 20 - 29 mmol/L   Calcium  9.3 8.6 - 10.2 mg/dL   Total Protein 6.6 6.0 - 8.5 g/dL   Albumin  4.6 3.9 - 4.9 g/dL    Globulin, Total 2.0 1.5 - 4.5 g/dL   Bilirubin Total 0.8 0.0 - 1.2 mg/dL   Alkaline Phosphatase 71 44 - 121 IU/L   AST 17 0 - 40 IU/L   ALT 12 0 - 44 IU/L  TSH     Status: None   Collection Time: 10/28/23 12:06 PM  Result Value Ref Range   TSH 2.140 0.450 - 4.500 uIU/mL  Hemoglobin A1c     Status: Abnormal   Collection Time: 10/28/23 12:06 PM  Result Value Ref Range   Hgb A1c MFr Bld 6.3 (H) 4.8 - 5.6 %    Comment:          Prediabetes: 5.7 - 6.4          Diabetes: >6.4          Glycemic control for adults with diabetes: <7.0    Est. average glucose Bld gHb Est-mCnc 134 mg/dL  Vitamin B12     Status: Abnormal   Collection Time: 10/28/23 12:06 PM  Result Value Ref Range   Vitamin B-12 1,683 (H) 232 - 1,245 pg/mL       Assessment & Plan:   Problem List Items Addressed This Visit       Cardiovascular and Mediastinum   Chronic heart failure with preserved ejection fraction [facility])   Patient is seen by Cardiology, who manage this condition.  He is well controlled with current therapy.   Will defer to them for further changes to plan of care.       Relevant Orders   CMP14+EGFR (Completed)   CBC with Differential/Platelet     Respiratory  Chronic obstructive pulmonary disease (HCC)   Patient stable.  Well controlled with current therapy.   Continue current meds.          Endocrine   Type 2 diabetes mellitus with hyperglycemia, without long-term current use of insulin (HCC) - Primary   Checking labs today. Will call pt. With results  Continue current diabetes POC, as patient has been well controlled on current regimen.  Will adjust meds if needed based on labs.        Relevant Orders   CMP14+EGFR (Completed)   Hemoglobin A1c (Completed)   CBC with Differential/Platelet     Nervous and Auditory   Chronic bilateral low back pain with bilateral sciatica   Patient stable.  Well controlled with current therapy.   Continue current meds.       Relevant  Orders   CMP14+EGFR (Completed)   CBC with Differential/Platelet     Other   Mixed hyperlipidemia   Checking labs today.  Continue current therapy for lipid control. Will modify as needed based on labwork results.        Relevant Orders   Lipid panel (Completed)   CMP14+EGFR (Completed)   CBC with Differential/Platelet   Vitamin D  deficiency, unspecified   Checking labs today.  Will continue supplements as needed.        Relevant Orders   VITAMIN D  25 Hydroxy (Vit-D Deficiency, Fractures) (Completed)   CMP14+EGFR (Completed)   CBC with Differential/Platelet   B12 deficiency due to diet   Checking labs today.  Will continue supplements as needed.        Relevant Orders   CMP14+EGFR (Completed)   Vitamin B12 (Completed)   CBC with Differential/Platelet   Other Visit Diagnoses       Other fatigue       Relevant Orders   CMP14+EGFR (Completed)   TSH (Completed)   CBC with Differential/Platelet     Hypertrophy of nail       Setting up referral to Podiatry for patient.  Will defer to them for further treatment.   Relevant Orders   Ambulatory referral to Podiatry       Return in about 3 months (around 01/27/2024) for F/U.   Total time spent: 20 minutes  Trenda Frisk, FNP  10/28/2023   This document may have been prepared by [facility] Voice Recognition software and as such may include unintentional dictation errors.

## 2023-10-29 LAB — CMP14+EGFR
ALT: 12 IU/L (ref 0–44)
AST: 17 IU/L (ref 0–40)
Albumin: 4.6 g/dL (ref 3.9–4.9)
Alkaline Phosphatase: 71 IU/L (ref 44–121)
BUN/Creatinine Ratio: 6 — ABNORMAL LOW (ref 10–24)
BUN: 5 mg/dL — ABNORMAL LOW (ref 8–27)
Bilirubin Total: 0.8 mg/dL (ref 0.0–1.2)
CO2: 23 mmol/L (ref 20–29)
Calcium: 9.3 mg/dL (ref 8.6–10.2)
Chloride: 100 mmol/L (ref 96–106)
Creatinine, Ser: 0.8 mg/dL (ref 0.76–1.27)
Globulin, Total: 2 g/dL (ref 1.5–4.5)
Glucose: 100 mg/dL — ABNORMAL HIGH (ref 70–99)
Potassium: 4.3 mmol/L (ref 3.5–5.2)
Sodium: 136 mmol/L (ref 134–144)
Total Protein: 6.6 g/dL (ref 6.0–8.5)
eGFR: 95 mL/min/{1.73_m2} (ref >59)

## 2023-10-29 LAB — LIPID PANEL
Chol/HDL Ratio: 6.8 ratio — ABNORMAL HIGH (ref 0.0–5.0)
Cholesterol, Total: 231 mg/dL — ABNORMAL HIGH (ref 100–199)
HDL: 34 mg/dL — ABNORMAL LOW (ref >39)
LDL Chol Calc (NIH): 175 mg/dL — ABNORMAL HIGH (ref 0–99)
Triglycerides: 121 mg/dL (ref 0–149)
VLDL Cholesterol Cal: 22 mg/dL (ref 5–40)

## 2023-10-29 LAB — TSH: TSH: 2.14 u[IU]/mL (ref 0.450–4.500)

## 2023-10-29 LAB — VITAMIN B12: Vitamin B-12: 1683 pg/mL — ABNORMAL HIGH (ref 232–1245)

## 2023-10-29 LAB — HEMOGLOBIN A1C
Est. average glucose Bld gHb Est-mCnc: 134 mg/dL
Hgb A1c MFr Bld: 6.3 % — ABNORMAL HIGH (ref 4.8–5.6)

## 2023-10-29 LAB — VITAMIN D 25 HYDROXY (VIT D DEFICIENCY, FRACTURES): Vit D, 25-Hydroxy: 53 ng/mL (ref 30.0–100.0)

## 2023-11-01 NOTE — Progress Notes (Signed)
 Patient notified

## 2023-11-05 ENCOUNTER — Encounter: Payer: Self-pay | Admitting: Family

## 2023-11-12 ENCOUNTER — Other Ambulatory Visit: Payer: Self-pay | Admitting: Family

## 2023-11-14 ENCOUNTER — Other Ambulatory Visit: Payer: Self-pay | Admitting: Family

## 2023-11-14 DIAGNOSIS — F419 Anxiety disorder, unspecified: Secondary | ICD-10-CM

## 2023-11-14 MED ORDER — OXYCODONE HCL 5 MG PO TABS: 5.0000 mg | ORAL_TABLET | Freq: Three times a day (TID) | ORAL | 0 refills | Status: DC | PRN

## 2023-11-14 MED ORDER — CLONAZEPAM 1 MG PO TABS
1.0000 mg | ORAL_TABLET | Freq: Two times a day (BID) | ORAL | 0 refills | Status: DC
Start: 2023-11-14 — End: 2023-12-13

## 2023-11-15 ENCOUNTER — Other Ambulatory Visit: Payer: Self-pay | Admitting: Family

## 2023-11-26 ENCOUNTER — Ambulatory Visit: Admitting: Podiatry

## 2023-11-27 ENCOUNTER — Telehealth: Payer: Self-pay | Admitting: Family

## 2023-11-27 NOTE — Telephone Encounter (Signed)
 Patient called in c/o an abscess tooth. He was wanting to see if we can send him in some antibiotics. He does not have a regular dentist but I informed him about [facility] where they have a dentist and it is based off of your insurance and income. I provided him with their phone number so he can see if they accept his insurance and get the tooth checked out. Can we send some antibiotics in to [facility] on [address]?

## 2023-11-28 ENCOUNTER — Other Ambulatory Visit: Payer: Self-pay

## 2023-11-28 MED ORDER — AMOXICILLIN-POT CLAVULANATE 875-125 MG PO TABS: 1.0000 | ORAL_TABLET | Freq: Two times a day (BID) | ORAL | 0 refills | Status: DC

## 2023-12-09 ENCOUNTER — Telehealth: Payer: Self-pay | Admitting: Family

## 2023-12-09 ENCOUNTER — Other Ambulatory Visit: Payer: Self-pay

## 2023-12-09 NOTE — Telephone Encounter (Signed)
 Patient left VM stating the abx for the abscess tooth helped. He is set up for a dental appt on 6/9. Wants to know if he should worry about needing abx again before then for the tooth? Please advise.

## 2023-12-11 ENCOUNTER — Other Ambulatory Visit: Payer: Self-pay

## 2023-12-11 ENCOUNTER — Other Ambulatory Visit: Payer: Self-pay | Admitting: Family

## 2023-12-11 DIAGNOSIS — F419 Anxiety disorder, unspecified: Secondary | ICD-10-CM

## 2023-12-12 ENCOUNTER — Other Ambulatory Visit: Payer: Self-pay | Admitting: Family

## 2023-12-13 ENCOUNTER — Other Ambulatory Visit: Payer: Self-pay | Admitting: Family

## 2023-12-13 DIAGNOSIS — F419 Anxiety disorder, unspecified: Secondary | ICD-10-CM

## 2023-12-13 MED ORDER — AMOXICILLIN-POT CLAVULANATE 875-125 MG PO TABS: 1.0000 | ORAL_TABLET | Freq: Two times a day (BID) | ORAL | 0 refills | Status: DC

## 2023-12-13 MED ORDER — OXYCODONE HCL 5 MG PO TABS
5.0000 mg | ORAL_TABLET | Freq: Three times a day (TID) | ORAL | 0 refills | Status: DC | PRN
Start: 2023-12-13 — End: 2024-01-02

## 2023-12-21 ENCOUNTER — Encounter: Payer: Self-pay | Admitting: Family

## 2023-12-21 NOTE — Assessment & Plan Note (Signed)
 Checking labs today. Will call pt. With results  Continue current diabetes POC, as patient has been well controlled on current regimen.  Will adjust meds if needed based on labs.

## 2023-12-21 NOTE — Assessment & Plan Note (Signed)
 Checking labs today.  Continue current therapy for lipid control. Will modify as needed based on labwork results.

## 2023-12-21 NOTE — Assessment & Plan Note (Signed)
 Checking labs today.  Will continue supplements as needed.

## 2023-12-21 NOTE — Assessment & Plan Note (Signed)
 Patient stable.  Well controlled with current therapy.   Continue current meds.

## 2023-12-21 NOTE — Assessment & Plan Note (Signed)
Patient is seen by Cardiology, who manage this condition.  He is well controlled with current therapy.   Will defer to them for further changes to plan of care.  

## 2023-12-24 ENCOUNTER — Ambulatory Visit: Admitting: Podiatry

## 2023-12-24 ENCOUNTER — Encounter: Payer: Self-pay | Admitting: Podiatry

## 2023-12-24 VITALS — Ht 73.0 in | Wt 246.6 lb

## 2023-12-24 DIAGNOSIS — B351 Tinea unguium: Secondary | ICD-10-CM | POA: Diagnosis not present

## 2023-12-24 DIAGNOSIS — L97522 Non-pressure chronic ulcer of other part of left foot with fat layer exposed: Secondary | ICD-10-CM

## 2023-12-24 DIAGNOSIS — M79674 Pain in right toe(s): Secondary | ICD-10-CM | POA: Diagnosis not present

## 2023-12-24 DIAGNOSIS — M79675 Pain in left toe(s): Secondary | ICD-10-CM | POA: Diagnosis not present

## 2023-12-24 NOTE — Progress Notes (Signed)
 Chief Complaint  Patient presents with   Nail Problem    Pt is here due to left toenail problem his great and 2nd toenail are thick and long and needs to be treated, pt also has a large callus on the side of his great toe not that needs to be treated and small sore on pinky toe that he states been there for a while was a scab but since came off.    SUBJECTIVE Patient prediabetic, last A1c 10/28/2023 was 6.3, presents to office today complaining of elongated, thickened nails that cause pain while ambulating in shoes.  Patient is unable to trim their own nails. Patient is here for further evaluation and treatment.  Past Medical History:  Diagnosis Date   Abdominal pain 04/08/2019   Anxiety    Anxiety hyperventilation 05/08/2019   Closed compression fracture of L4 vertebra (HCC) 08/18/2021   Closed fracture of distal fibula 02/13/2021   Compression fracture of thoracic vertebra (HCC) 08/18/2021   Depression    Elevated troponin    Gout    Hypokalemia 08/16/2021   Hyponatremia 08/16/2021   ICH (intracerebral hemorrhage) (HCC) 08/15/2021   Incarcerated left inguinal hernia    Injury of right ankle 02/13/2021   Left flank pain 02/03/2013   Other fatigue 10/25/2022   Pain    Pressure injury of skin 09/11/2021   Rhabdomyolysis 08/16/2021    Allergies  Allergen Reactions   Ciprocin-Fluocin-Procin [Fluocinolone] Anaphylaxis    Hip hurt   Ciprofloxacin Other (See Comments)    Unknown reaction   Colchicine  Other (See Comments)   Ezetimibe Nausea And Vomiting   Farxiga  [Dapagliflozin ] Diarrhea    dizziness   Fentanyl      Couldn't control hisself   Meloxicam  Diarrhea and Swelling   Duloxetine Diarrhea, Nausea And Vomiting and Other (See Comments)    Altered mental status     OBJECTIVE General Patient is awake, alert, and oriented x 3 and in no acute distress. Derm Skin is dry and supple bilateral. Negative open lesions or macerations. Remaining integument unremarkable. Nails  are tender, long, thickened and dystrophic with subungual debris, consistent with onychomycosis, 1-5 bilateral. No signs of infection noted. Ulcer noted to the fifth digit of the left foot measuring approximately 0.4 x 0.4 x 0.1 cm.  Granular wound base.  No exposed bone muscle tendon ligament or joint.  Clinically no indication of infection Vasc diminished pulses.  However capillary refill to the left foot <3sec w/ foot warm to touch.  Venous insufficiency also noted LT > RT Neuro light touch and protective threshold diminished Musculoskeletal Exam no amputations.  Hammertoe contracture noted to the fifth digit of the left foot  ASSESSMENT 1.  Pain due to onychomycosis of toenails both 2.  Diabetic ulcer fifth digit left with fat layer exposed  PLAN OF CARE -Patient evaluated today.  -Instructed to maintain good pedal hygiene and foot care.  -Mechanical debridement of nails 1-5 bilaterally performed using a nail nipper. Filed with dremel without incident.  -Medically necessary excisional debridement including subcutaneous tissue was performed today using a tissue nipper.  Excisional debridement of the necrotic nonviable tissue down to healthier bleeding viable tissue was also performed with postdebridement measurement same as pre-. -Recommend triple antibiotic and a Band-Aid daily -Return to clinic 3 weeks   Dot Gazella, DPM [facility]  Dr. Dot Gazella, DPM    [address]  [place], [place] [address]                Office [phone]  Fax [phone]

## 2024-01-02 ENCOUNTER — Telehealth: Payer: Self-pay | Admitting: Family

## 2024-01-02 DIAGNOSIS — F419 Anxiety disorder, unspecified: Secondary | ICD-10-CM

## 2024-01-02 MED ORDER — CLONAZEPAM 1 MG PO TABS
1.0000 mg | ORAL_TABLET | Freq: Two times a day (BID) | ORAL | 0 refills | Status: DC
Start: 2024-01-02 — End: 2024-01-10

## 2024-01-02 MED ORDER — OXYCODONE HCL 5 MG PO TABS: 5.0000 mg | ORAL_TABLET | Freq: Three times a day (TID) | ORAL | 0 refills | Status: DC | PRN
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
## 2024-01-07 ENCOUNTER — Other Ambulatory Visit: Payer: Self-pay | Admitting: Family

## 2024-01-07 ENCOUNTER — Telehealth: Payer: Self-pay | Admitting: Family

## 2024-01-07 DIAGNOSIS — F419 Anxiety disorder, unspecified: Secondary | ICD-10-CM

## 2024-01-07 NOTE — Telephone Encounter (Signed)
 Patient left VM requesting that I give him a call back. Did not state what he needed.

## 2024-01-10 MED ORDER — OXYCODONE HCL 5 MG PO TABS: 5.0000 mg | ORAL_TABLET | Freq: Three times a day (TID) | ORAL | 0 refills | Status: DC | PRN

## 2024-01-27 ENCOUNTER — Ambulatory Visit: Payer: Medicare HMO | Admitting: Dermatology

## 2024-01-28 ENCOUNTER — Encounter: Payer: Self-pay | Admitting: Family

## 2024-01-28 ENCOUNTER — Ambulatory Visit: Admitting: Family

## 2024-01-28 DIAGNOSIS — G8929 Other chronic pain: Secondary | ICD-10-CM

## 2024-01-28 DIAGNOSIS — M5442 Lumbago with sciatica, left side: Secondary | ICD-10-CM | POA: Diagnosis not present

## 2024-01-28 DIAGNOSIS — J449 Chronic obstructive pulmonary disease, unspecified: Secondary | ICD-10-CM | POA: Diagnosis not present

## 2024-01-28 DIAGNOSIS — Z0130 Encounter for examination of blood pressure without abnormal findings: Secondary | ICD-10-CM

## 2024-01-28 DIAGNOSIS — E559 Vitamin D deficiency, unspecified: Secondary | ICD-10-CM | POA: Diagnosis not present

## 2024-01-28 DIAGNOSIS — I5032 Chronic diastolic (congestive) heart failure: Secondary | ICD-10-CM

## 2024-01-28 DIAGNOSIS — E782 Mixed hyperlipidemia: Secondary | ICD-10-CM | POA: Diagnosis not present

## 2024-01-28 DIAGNOSIS — E538 Deficiency of other specified B group vitamins: Secondary | ICD-10-CM

## 2024-01-28 DIAGNOSIS — R5383 Other fatigue: Secondary | ICD-10-CM | POA: Diagnosis not present

## 2024-01-28 DIAGNOSIS — E1165 Type 2 diabetes mellitus with hyperglycemia: Secondary | ICD-10-CM

## 2024-01-28 DIAGNOSIS — M5441 Lumbago with sciatica, right side: Secondary | ICD-10-CM | POA: Diagnosis not present

## 2024-01-28 NOTE — Progress Notes (Unsigned)
 Established Patient Office Visit  Subjective:  Patient ID: Lance House, male    DOB: 03-07-1951  Age: 73 y.o. MRN: [id number]  Chief Complaint  Patient presents with  . Follow-up    3 month follow up    HPI  No other concerns at this time.   Past Medical History:  Diagnosis Date  . Abdominal pain 04/08/2019  . Anxiety   . Anxiety hyperventilation 05/08/2019  . Closed compression fracture of L4 vertebra (HCC) 08/18/2021  . Closed fracture of distal fibula 02/13/2021  . Compression fracture of thoracic vertebra (HCC) 08/18/2021  . Depression   . Elevated troponin   . Gout   . Hypokalemia 08/16/2021  . Hyponatremia 08/16/2021  . ICH (intracerebral hemorrhage) (HCC) 08/15/2021  . Incarcerated left inguinal hernia   . Injury of right ankle 02/13/2021  . Left flank pain 02/03/2013  . Other fatigue 10/25/2022  . Pain   . Pressure injury of skin 09/11/2021  . Rhabdomyolysis 08/16/2021    Past Surgical History:  Procedure Laterality Date  . APPENDECTOMY  1994  . INGUINAL HERNIA REPAIR Left 04/13/2019   Procedure: HERNIA REPAIR INGUINAL ADULT OPEN, LEFT;  Surgeon: Marolyn Nest, MD;  Location: [facility] ORS;  Service: General;  Laterality: Left;  . NASAL SIN[facility] SURGERY  2015   [facility]  . RIGHT HEART CATH Right 04/26/2023   Procedure: RIGHT HEART CATH;  Surgeon: Rolan Ezra RAMAN, MD;  Location: [facility] INVASIVE CV LAB;  Service: Cardiovascular;  Laterality: Right;  SABRA VIDEO ASSISTED THORACOSCOPY (VATS)/DECORTICATION Left 09/11/2021   Procedure: VIDEO ASSISTED THORACOSCOPY (VATS)/DECORTICATION;  Surgeon: Kerrin Elspeth BROCKS, MD;  Location: [facility] OR;  Service: Thoracic;  Laterality: Left;    Social History   Socioeconomic History  . Marital status: Married    Spouse name: Not on file  . Number of children: Not on file  . Years of education: Not on file  . Highest education level: Not on file  Occupational History  . Not on file  Tobacco Use  . Smoking status: Every Day     Current packs/day: 1.00    Types: Cigarettes  . Smokeless tobacco: Never  Vaping Use  . Vaping status: Never Used  Substance and Sexual Activity  . Alcohol use: Not Currently    Alcohol/week: 42.0 standard drinks of alcohol    Types: 42 Cans of beer per week    Comment: quit around 05/2021  . Drug use: Not Currently  . Sexual activity: Not on file  Other Topics Concern  . Not on file  Social History Narrative   Lives with wife, Dorthea. No indoor pets.   Social Drivers of Corporate investment banker Strain: Low Risk  (07/10/2023)   Overall Financial Resource Strain (CARDIA)   . Difficulty of Paying Living Expenses: Not very hard  Food Insecurity: No Food Insecurity (07/10/2023)   Hunger Vital Sign   . Worried About Programme researcher, broadcasting/film/video in the Last Year: Never true   . Ran Out of Food in the Last Year: Never true  Transportation Needs: No Transportation Needs (07/10/2023)   PRAPARE - Transportation   . Lack of Transportation (Medical): No   . Lack of Transportation (Non-Medical): No  Physical Activity: Sufficiently Active (07/10/2023)   Exercise Vital Sign   . Days of Exercise per Week: 3 days   . Minutes of Exercise per Session: 80 min  Stress: No Stress Concern Present (07/10/2023)   [employer] of Occupational Health - Occupational Stress Questionnaire   .  Feeling of Stress : Not at all  Social Connections: Socially Isolated (07/10/2023)   Social Connection and Isolation Panel   . Frequency of Communication with Friends and Family: More than three times a week   . Frequency of Social Gatherings with Friends and Family: Once a week   . Attends Religious Services: Never   . Active Member of Clubs or Organizations: No   . Attends Banker Meetings: Never   . Marital Status: Separated  Intimate Partner Violence: Not At Risk (07/10/2023)   Humiliation, Afraid, Rape, and Kick questionnaire   . Fear of Current or Ex-Partner: No   . Emotionally Abused: No   .  Physically Abused: No   . Sexually Abused: No    Family History  Problem Relation Age of Onset  . Skin cancer Father   . Multiple sclerosis Sister   . Non-Hodgkin's lymphoma Brother     Allergies  Allergen Reactions  . Ciprocin-Fluocin-Procin [Fluocinolone] Anaphylaxis    Hip hurt  . Ciprofloxacin Other (See Comments)    Unknown reaction  . Colchicine  Other (See Comments)  . Ezetimibe Nausea And Vomiting  . Farxiga  [Dapagliflozin ] Diarrhea    dizziness  . Fentanyl      Couldn't control hisself  . Meloxicam  Diarrhea and Swelling  . Duloxetine Diarrhea, Nausea And Vomiting and Other (See Comments)    Altered mental status    Review of Systems  All other systems reviewed and are negative.      Objective:   BP 132/78   Pulse 70   Ht 6' 1 (1.854 m)   Wt 227 lb 6.4 oz (103.1 kg)   SpO2 93%   BMI 30.00 kg/m   Vitals:   01/28/24 1121  BP: 132/78  Pulse: 70  Height: 6' 1 (1.854 m)  Weight: 227 lb 6.4 oz (103.1 kg)  SpO2: 93%  BMI (Calculated): 30.01    Physical Exam Vitals and nursing note reviewed.  Constitutional:      Appearance: Normal appearance. He is normal weight.  Eyes:     Pupils: Pupils are equal, round, and reactive to light.  Cardiovascular:     Rate and Rhythm: Normal rate and regular rhythm.     Pulses: Normal pulses.     Heart sounds: Normal heart sounds.  Pulmonary:     Effort: Pulmonary effort is normal.     Breath sounds: Normal breath sounds.  Neurological:     Mental Status: He is alert.  Psychiatric:        Mood and Affect: Mood normal.        Behavior: Behavior normal.      No results found for any visits on 01/28/24.  No results found for this or any previous visit (from the past 2160 hours).     Assessment & Plan:   Assessment & Plan Type 2 diabetes mellitus with hyperglycemia, without long-term current use of insulin (HCC)  Mixed hyperlipidemia  Vitamin D  deficiency, unspecified  B12 deficiency due to  diet  Chronic bilateral low back pain with bilateral sciatica  Chronic heart failure with preserved ejection fraction (HCC)  Other fatigue     Return in about 3 months (around 04/29/2024).   Total time spent: {AMA time spent:29001} minutes  Lance CHRISTELLA ARRANT, FNP  01/28/2024   This document may have been prepared by [facility] Voice Recognition software and as such may include unintentional dictation errors.

## 2024-01-29 LAB — LIPID PANEL
Chol/HDL Ratio: 5.5 ratio — ABNORMAL HIGH (ref 0.0–5.0)
Cholesterol, Total: 235 mg/dL — ABNORMAL HIGH (ref 100–199)
HDL: 43 mg/dL (ref >39)
LDL Chol Calc (NIH): 170 mg/dL — ABNORMAL HIGH (ref 0–99)
Triglycerides: 120 mg/dL (ref 0–149)
VLDL Cholesterol Cal: 22 mg/dL (ref 5–40)

## 2024-01-29 LAB — CMP14+EGFR
ALT: 9 IU/L (ref 0–44)
AST: 14 IU/L (ref 0–40)
Albumin: 4 g/dL (ref 3.9–4.9)
Alkaline Phosphatase: 75 IU/L (ref 44–121)
BUN/Creatinine Ratio: 6 — ABNORMAL LOW (ref 10–24)
BUN: 5 mg/dL — ABNORMAL LOW (ref 8–27)
Bilirubin Total: 0.6 mg/dL (ref 0.0–1.2)
CO2: 20 mmol/L (ref 20–29)
Calcium: 9.3 mg/dL (ref 8.6–10.2)
Chloride: 101 mmol/L (ref 96–106)
Creatinine, Ser: 0.81 mg/dL (ref 0.76–1.27)
Globulin, Total: 2.9 g/dL (ref 1.5–4.5)
Glucose: 94 mg/dL (ref 70–99)
Potassium: 4.5 mmol/L (ref 3.5–5.2)
Sodium: 137 mmol/L (ref 134–144)
Total Protein: 6.9 g/dL (ref 6.0–8.5)
eGFR: 95 mL/min/1.73 (ref >59)

## 2024-01-29 LAB — CBC WITH DIFFERENTIAL/PLATELET
Basophils Absolute: 0.1 x10E3/uL (ref 0.0–0.2)
Basos: 1 %
EOS (ABSOLUTE): 0.4 x10E3/uL (ref 0.0–0.4)
Eos: 4 %
Hematocrit: 55.3 % — ABNORMAL HIGH (ref 37.5–51.0)
Hemoglobin: 18 g/dL — ABNORMAL HIGH (ref 13.0–17.7)
Immature Grans (Abs): 0 x10E3/uL (ref 0.0–0.1)
Immature Granulocytes: 0 %
Lymphocytes Absolute: 2.2 x10E3/uL (ref 0.7–3.1)
Lymphs: 25 %
MCH: 30.4 pg (ref 26.6–33.0)
MCHC: 32.5 g/dL (ref 31.5–35.7)
MCV: 93 fL (ref 79–97)
Monocytes Absolute: 0.6 x10E3/uL (ref 0.1–0.9)
Monocytes: 6 %
Neutrophils Absolute: 5.5 x10E3/uL (ref 1.4–7.0)
Neutrophils: 63 %
Platelets: 233 x10E3/uL (ref 150–450)
RBC: 5.93 x10E6/uL — ABNORMAL HIGH (ref 4.14–5.80)
RDW: 13.9 % (ref 11.6–15.4)
WBC: 8.7 x10E3/uL (ref 3.4–10.8)

## 2024-01-29 LAB — TSH: TSH: 1.29 u[IU]/mL (ref 0.450–4.500)

## 2024-01-29 LAB — HEMOGLOBIN A1C
Est. average glucose Bld gHb Est-mCnc: 126 mg/dL
Hgb A1c MFr Bld: 6 % — ABNORMAL HIGH (ref 4.8–5.6)

## 2024-01-29 LAB — VITAMIN D 25 HYDROXY (VIT D DEFICIENCY, FRACTURES): Vit D, 25-Hydroxy: 42.3 ng/mL (ref 30.0–100.0)

## 2024-01-29 LAB — VITAMIN B12: Vitamin B-12: 612 pg/mL (ref 232–1245)

## 2024-01-29 NOTE — Assessment & Plan Note (Signed)
 Patient stable.  Well controlled with current therapy.   Continue current meds.

## 2024-01-29 NOTE — Assessment & Plan Note (Signed)
Patient is seen by Cardiology, who manage this condition.  He is well controlled with current therapy.   Will defer to them for further changes to plan of care.  

## 2024-01-29 NOTE — Assessment & Plan Note (Signed)
 Checking labs today.  Continue current therapy for lipid control. Will modify as needed based on labwork results.   -CMP w/eGFR -Lipid Panel

## 2024-01-29 NOTE — Assessment & Plan Note (Signed)
 Checking labs today. Will call pt. With results  Continue current diabetes POC, as patient has been well controlled on current regimen.  Will adjust meds if needed based on labs.   -CBC w/Diff -CMP w/eGFR -Hemoglobin A1C

## 2024-01-29 NOTE — Assessment & Plan Note (Signed)
 Checking labs today.  Will continue supplements as needed.   - Vitamin D  - Vitamin B12 - TSH

## 2024-01-30 ENCOUNTER — Other Ambulatory Visit: Payer: Self-pay | Admitting: Family

## 2024-02-06 ENCOUNTER — Other Ambulatory Visit: Payer: Self-pay

## 2024-02-07 MED ORDER — OXYCODONE HCL 5 MG PO TABS: 5.0000 mg | ORAL_TABLET | Freq: Three times a day (TID) | ORAL | 0 refills | Status: DC | PRN

## 2024-02-18 ENCOUNTER — Other Ambulatory Visit: Payer: Self-pay

## 2024-02-20 ENCOUNTER — Ambulatory Visit: Payer: Self-pay

## 2024-02-24 ENCOUNTER — Other Ambulatory Visit: Payer: Self-pay | Admitting: Family

## 2024-02-25 ENCOUNTER — Other Ambulatory Visit: Payer: Self-pay | Admitting: Family

## 2024-03-02 ENCOUNTER — Other Ambulatory Visit: Payer: Self-pay | Admitting: Family

## 2024-03-02 DIAGNOSIS — F419 Anxiety disorder, unspecified: Secondary | ICD-10-CM

## 2024-03-03 MED ORDER — OXYCODONE HCL 5 MG PO TABS: 5.0000 mg | ORAL_TABLET | Freq: Three times a day (TID) | ORAL | 0 refills | Status: DC | PRN

## 2024-03-30 ENCOUNTER — Ambulatory Visit: Admitting: Podiatry

## 2024-03-31 ENCOUNTER — Other Ambulatory Visit: Payer: Self-pay

## 2024-04-06 ENCOUNTER — Telehealth: Payer: Self-pay

## 2024-04-06 NOTE — Telephone Encounter (Signed)
 Patient Lance House asking for jessie to call him back, did not state what the call was regarding but  wanted her to call him so he didn't have to explain twice

## 2024-04-08 ENCOUNTER — Telehealth: Payer: Self-pay | Admitting: Family

## 2024-04-08 ENCOUNTER — Other Ambulatory Visit: Payer: Self-pay | Admitting: Family

## 2024-04-08 NOTE — Telephone Encounter (Signed)
 Patient left VM wanting to know if his meds have been sent in.  His pain medication is what I'm pretty sure he is referring to and it has not been sent in yet.

## 2024-04-09 MED ORDER — OXYCODONE HCL 5 MG PO TABS: 5.0000 mg | ORAL_TABLET | Freq: Three times a day (TID) | ORAL | 0 refills | Status: DC | PRN

## 2024-04-30 ENCOUNTER — Encounter: Payer: Self-pay | Admitting: Family

## 2024-04-30 ENCOUNTER — Ambulatory Visit: Admitting: Family

## 2024-04-30 VITALS — BP 144/78 | HR 93 | Ht 73.0 in | Wt 219.8 lb

## 2024-04-30 DIAGNOSIS — M5441 Lumbago with sciatica, right side: Secondary | ICD-10-CM | POA: Diagnosis not present

## 2024-04-30 DIAGNOSIS — E538 Deficiency of other specified B group vitamins: Secondary | ICD-10-CM

## 2024-04-30 DIAGNOSIS — G8929 Other chronic pain: Secondary | ICD-10-CM | POA: Diagnosis not present

## 2024-04-30 DIAGNOSIS — I5032 Chronic diastolic (congestive) heart failure: Secondary | ICD-10-CM | POA: Diagnosis not present

## 2024-04-30 DIAGNOSIS — E1165 Type 2 diabetes mellitus with hyperglycemia: Secondary | ICD-10-CM | POA: Diagnosis not present

## 2024-04-30 DIAGNOSIS — R5383 Other fatigue: Secondary | ICD-10-CM

## 2024-04-30 DIAGNOSIS — M48061 Spinal stenosis, lumbar region without neurogenic claudication: Secondary | ICD-10-CM

## 2024-04-30 DIAGNOSIS — F331 Major depressive disorder, recurrent, moderate: Secondary | ICD-10-CM | POA: Diagnosis not present

## 2024-04-30 DIAGNOSIS — K7010 Alcoholic hepatitis without ascites: Secondary | ICD-10-CM | POA: Diagnosis not present

## 2024-04-30 DIAGNOSIS — G894 Chronic pain syndrome: Secondary | ICD-10-CM

## 2024-04-30 DIAGNOSIS — S0634AA Traumatic hemorrhage of right cerebrum with loss of consciousness status unknown, initial encounter: Secondary | ICD-10-CM

## 2024-04-30 DIAGNOSIS — J449 Chronic obstructive pulmonary disease, unspecified: Secondary | ICD-10-CM | POA: Diagnosis not present

## 2024-04-30 DIAGNOSIS — E559 Vitamin D deficiency, unspecified: Secondary | ICD-10-CM | POA: Diagnosis not present

## 2024-04-30 DIAGNOSIS — M5442 Lumbago with sciatica, left side: Secondary | ICD-10-CM | POA: Diagnosis not present

## 2024-04-30 DIAGNOSIS — E782 Mixed hyperlipidemia: Secondary | ICD-10-CM | POA: Diagnosis not present

## 2024-04-30 DIAGNOSIS — J9602 Acute respiratory failure with hypercapnia: Secondary | ICD-10-CM | POA: Insufficient documentation

## 2024-04-30 DIAGNOSIS — Z91148 Patient's other noncompliance with medication regimen for other reason: Secondary | ICD-10-CM

## 2024-04-30 DIAGNOSIS — F10220 Alcohol dependence with intoxication, uncomplicated: Secondary | ICD-10-CM | POA: Insufficient documentation

## 2024-04-30 DIAGNOSIS — I509 Heart failure, unspecified: Secondary | ICD-10-CM

## 2024-04-30 DIAGNOSIS — Z79899 Other long term (current) drug therapy: Secondary | ICD-10-CM

## 2024-04-30 LAB — POC CREATINE & ALBUMIN,URINE
Albumin/Creatinine Ratio, Urine, POC: <30
Creatinine, POC: 100 mg/dL
Microalbumin Ur, POC: 30 mg/L

## 2024-04-30 NOTE — Patient Instructions (Signed)
 [facility] [address] [place], [place] [address]  Phone: [phone]  Fax: [phone]

## 2024-04-30 NOTE — Progress Notes (Signed)
 Established Patient Office Visit  Subjective:  Patient ID: Lance House, male    DOB: October 21, 1950  Age: 73 y.o. MRN: [id number]  Chief Complaint  Patient presents with   Follow-up    3 month follow up    Patient is here today for his 3 months follow up.  He has been feeling well since last appointment.  He does have additional concerns to discuss today.  He is still having continued pain in his low back and legs, says that he has been having a lot of trouble with ambulation.  He does have his walker with him and is using it regularly when going out as well as at home.  He has needed continuous pain medication in the past, so we will have to get him set up for a pain clinic referral going forward.   He also continues to have anxiety/panic attacks.   He says that he feels like the anxiety is well managed with his current medication regimen.   Labs are due today.  He needs refills.   I have reviewed his active problem list, medication list, allergies, notes from last encounter, lab results for his appointment today.      No other concerns at this time.   Past Medical History:  Diagnosis Date   Abdominal pain 04/08/2019   Anxiety    Anxiety hyperventilation 05/08/2019   Closed compression fracture of L4 vertebra (HCC) 08/18/2021   Closed fracture of distal fibula 02/13/2021   Compression fracture of thoracic vertebra (HCC) 08/18/2021   Depression    Elevated troponin    Gout    Hypokalemia 08/16/2021   Hyponatremia 08/16/2021   ICH (intracerebral hemorrhage) (HCC) 08/15/2021   Incarcerated left inguinal hernia    Injury of right ankle 02/13/2021   Left flank pain 02/03/2013   Other fatigue 10/25/2022   Pain    Pressure injury of skin 09/11/2021   Rhabdomyolysis 08/16/2021    Past Surgical History:  Procedure Laterality Date   APPENDECTOMY  1994   INGUINAL HERNIA REPAIR Left 04/13/2019   Procedure: HERNIA REPAIR INGUINAL ADULT OPEN, LEFT;  Surgeon: Marolyn Nest, MD;  Location: [facility] ORS;  Service: General;  Laterality: Left;   NASAL SINUS SURGERY  2015   [facility]   RIGHT HEART CATH Right 04/26/2023   Procedure: RIGHT HEART CATH;  Surgeon: Rolan Ezra RAMAN, MD;  Location: [facility] INVASIVE CV LAB;  Service: Cardiovascular;  Laterality: Right;   VIDEO ASSISTED THORACOSCOPY (VATS)/DECORTICATION Left 09/11/2021   Procedure: VIDEO ASSISTED THORACOSCOPY (VATS)/DECORTICATION;  Surgeon: Kerrin Elspeth BROCKS, MD;  Location: [facility] OR;  Service: Thoracic;  Laterality: Left;    Social History   Socioeconomic History   Marital status: Married    Spouse name: Not on file   Number of children: Not on file   Years of education: Not on file   Highest education level: Not on file  Occupational History   Not on file  Tobacco Use   Smoking status: Every Day    Current packs/day: 1.00    Types: Cigarettes   Smokeless tobacco: Never  Vaping Use   Vaping status: Never Used  Substance and Sexual Activity   Alcohol use: Not Currently    Alcohol/week: 42.0 standard drinks of alcohol    Types: 42 Cans of beer per week    Comment: quit around 05/2021   Drug use: Not Currently   Sexual activity: Not on file  Other Topics Concern   Not on file  Social History  Narrative   Lives with wife, Dorthea. No indoor pets.   Social Drivers of Corporate investment banker Strain: Low Risk  (07/10/2023)   Overall Financial Resource Strain (CARDIA)    Difficulty of Paying Living Expenses: Not very hard  Food Insecurity: No Food Insecurity (07/10/2023)   Hunger Vital Sign    Worried About Running Out of Food in the Last Year: Never true    Ran Out of Food in the Last Year: Never true  Transportation Needs: No Transportation Needs (07/10/2023)   PRAPARE - Administrator, Civil Service (Medical): No    Lack of Transportation (Non-Medical): No  Physical Activity: Sufficiently Active (07/10/2023)   Exercise Vital Sign    Days of Exercise per Week: 3 days    Minutes  of Exercise per Session: 80 min  Stress: No Stress Concern Present (07/10/2023)   [employer] of Occupational Health - Occupational Stress Questionnaire    Feeling of Stress : Not at all  Social Connections: Socially Isolated (07/10/2023)   Social Connection and Isolation Panel    Frequency of Communication with Friends and Family: More than three times a week    Frequency of Social Gatherings with Friends and Family: Once a week    Attends Religious Services: Never    Database administrator or Organizations: No    Attends Banker Meetings: Never    Marital Status: Separated  Intimate Partner Violence: Not At Risk (07/10/2023)   Humiliation, Afraid, Rape, and Kick questionnaire    Fear of Current or Ex-Partner: No    Emotionally Abused: No    Physically Abused: No    Sexually Abused: No    Family History  Problem Relation Age of Onset   Skin cancer Father    Multiple sclerosis Sister    Non-Hodgkin's lymphoma Brother     Allergies  Allergen Reactions   Ciprocin-Fluocin-Procin [Fluocinolone] Anaphylaxis    Hip hurt   Ciprofloxacin Other (See Comments)    Unknown reaction   Colchicine  Other (See Comments)   Ezetimibe Nausea And Vomiting   Farxiga  [Dapagliflozin ] Diarrhea    dizziness   Fentanyl      Couldn't control hisself   Meloxicam  Diarrhea and Swelling   Duloxetine Diarrhea, Nausea And Vomiting and Other (See Comments)    Altered mental status    Review of Systems  Constitutional:  Positive for malaise/fatigue.  Musculoskeletal:  Positive for back pain, joint pain and myalgias.  Psychiatric/Behavioral:  The patient is nervous/anxious.   All other systems reviewed and are negative.      Objective:   BP (!) 144/78   Pulse 93   Ht 6' 1 (1.854 m)   Wt 219 lb 12.8 oz (99.7 kg)   SpO2 93%   BMI 29.00 kg/m   Vitals:   04/30/24 1106  BP: (!) 144/78  Pulse: 93  Height: 6' 1 (1.854 m)  Weight: 219 lb 12.8 oz (99.7 kg)  SpO2: 93%  BMI  (Calculated): 29.01    Physical Exam Vitals and nursing note reviewed.  Constitutional:      Appearance: Normal appearance. He is normal weight.  Eyes:     Pupils: Pupils are equal, round, and reactive to light.  Cardiovascular:     Rate and Rhythm: Normal rate and regular rhythm.     Pulses: Normal pulses.     Heart sounds: Normal heart sounds.  Pulmonary:     Effort: Pulmonary effort is normal.     Breath  sounds: Normal breath sounds.  Musculoskeletal:     Cervical back: Normal range of motion.     Lumbar back: Tenderness present. Decreased range of motion. Positive right straight leg raise test and positive left straight leg raise test.     Right hip: Decreased strength.     Left hip: Decreased strength.  Neurological:     Mental Status: He is alert.     Motor: Weakness present.     Gait: Gait abnormal.  Psychiatric:        Mood and Affect: Mood normal.        Behavior: Behavior normal.        Thought Content: Thought content normal.        Judgment: Judgment normal.      No results found for any visits on 04/30/24.  No results found for this or any previous visit (from the past 2160 hours).     Assessment & Plan Spinal stenosis at L4-L5 level Chronic pain syndrome Chronic bilateral low back pain with bilateral sciatica Setting patient up for referral to pain management.  Will defer to them for further treatment changes.  Reassess at follow up.  Major depressive disorder, recurrent, moderate (HCC) Patient stable.  Well controlled with current therapy.   Continue current meds.   Alcoholic hepatitis without ascites (HCC) Patient stable.  Well controlled with current therapy.   Continue current meds.   Chronic obstructive pulmonary disease, unspecified COPD type (HCC) Patient is seen by pulmonary, who manage this condition.  He is well controlled with current therapy.   Will defer to them for further changes to plan of care.  Type 2 diabetes mellitus  with hyperglycemia, without long-term current use of insulin (HCC) Checking labs today. Will call pt. With results  Continue current diabetes POC, as patient has been well controlled on current regimen.  Will adjust meds if needed based on labs.   -CBC w/Diff -CMP w/eGFR -Hemoglobin A1C  Mixed hyperlipidemia Checking labs today.  Continue current therapy for lipid control. Will modify as needed based on labwork results.   -CMP w/eGFR -Lipid Panel  Vitamin D  deficiency, unspecified B12 deficiency due to diet Other fatigue Checking labs today.  Will continue supplements as needed.   - Vitamin D  - Vitamin B12 - TSH  Chronic heart failure with preserved ejection fraction [facility]) Patient is seen by cardiology, who manage this condition.  He is well controlled with current therapy.   Will defer to them for further changes to plan of care.  Long term current use of therapeutic drug Noncompliance with medications UDS obtained in office today.  Will call with results when available.      Return in about 3 months (around 07/31/2024).   Total time spent: 30 minutes  ALAN CHRISTELLA ARRANT, FNP  04/30/2024   This document may have been prepared by [facility] Voice Recognition software and as such may include unintentional dictation errors.

## 2024-05-01 LAB — LIPID PANEL
Chol/HDL Ratio: 5.5 ratio — ABNORMAL HIGH (ref 0.0–5.0)
Cholesterol, Total: 219 mg/dL — ABNORMAL HIGH (ref 100–199)
HDL: 40 mg/dL (ref >39)
LDL Chol Calc (NIH): 157 mg/dL — ABNORMAL HIGH (ref 0–99)
Triglycerides: 121 mg/dL (ref 0–149)
VLDL Cholesterol Cal: 22 mg/dL (ref 5–40)

## 2024-05-01 LAB — CMP14+EGFR
ALT: 7 IU/L (ref 0–44)
AST: 14 IU/L (ref 0–40)
Albumin: 4.3 g/dL (ref 3.8–4.8)
Alkaline Phosphatase: 79 IU/L (ref 47–123)
BUN/Creatinine Ratio: 8 — ABNORMAL LOW (ref 10–24)
BUN: 7 mg/dL — ABNORMAL LOW (ref 8–27)
Bilirubin Total: 0.7 mg/dL (ref 0.0–1.2)
CO2: 23 mmol/L (ref 20–29)
Calcium: 9.4 mg/dL (ref 8.6–10.2)
Chloride: 98 mmol/L (ref 96–106)
Creatinine, Ser: 0.88 mg/dL (ref 0.76–1.27)
Globulin, Total: 3.1 g/dL (ref 1.5–4.5)
Glucose: 100 mg/dL — ABNORMAL HIGH (ref 70–99)
Potassium: 4.4 mmol/L (ref 3.5–5.2)
Sodium: 137 mmol/L (ref 134–144)
Total Protein: 7.4 g/dL (ref 6.0–8.5)
eGFR: 92 mL/min/1.73 (ref >59)

## 2024-05-01 LAB — CBC WITH DIFFERENTIAL/PLATELET
Basophils Absolute: 0.1 x10E3/uL (ref 0.0–0.2)
Basos: 1 %
EOS (ABSOLUTE): 0.3 x10E3/uL (ref 0.0–0.4)
Eos: 4 %
Hematocrit: 52.2 % — ABNORMAL HIGH (ref 37.5–51.0)
Hemoglobin: 17.7 g/dL (ref 13.0–17.7)
Immature Grans (Abs): 0 x10E3/uL (ref 0.0–0.1)
Immature Granulocytes: 0 %
Lymphocytes Absolute: 1.3 x10E3/uL (ref 0.7–3.1)
Lymphs: 15 %
MCH: 31.5 pg (ref 26.6–33.0)
MCHC: 33.9 g/dL (ref 31.5–35.7)
MCV: 93 fL (ref 79–97)
Monocytes Absolute: 0.7 x10E3/uL (ref 0.1–0.9)
Monocytes: 8 %
Neutrophils Absolute: 6 x10E3/uL (ref 1.4–7.0)
Neutrophils: 72 %
Platelets: 255 x10E3/uL (ref 150–450)
RBC: 5.62 x10E6/uL (ref 4.14–5.80)
RDW: 13.1 % (ref 11.6–15.4)
WBC: 8.4 x10E3/uL (ref 3.4–10.8)

## 2024-05-01 LAB — VITAMIN D 25 HYDROXY (VIT D DEFICIENCY, FRACTURES): Vit D, 25-Hydroxy: 58.6 ng/mL (ref 30.0–100.0)

## 2024-05-01 LAB — HEMOGLOBIN A1C
Est. average glucose Bld gHb Est-mCnc: 114 mg/dL
Hgb A1c MFr Bld: 5.6 % (ref 4.8–5.6)

## 2024-05-01 LAB — TSH: TSH: 1.2 u[IU]/mL (ref 0.450–4.500)

## 2024-05-01 LAB — VITAMIN B12: Vitamin B-12: >2000 pg/mL — ABNORMAL HIGH (ref 232–1245)

## 2024-05-03 NOTE — Assessment & Plan Note (Signed)
 Checking labs today. Will call pt. With results  Continue current diabetes POC, as patient has been well controlled on current regimen.  Will adjust meds if needed based on labs.   -CBC w/Diff -CMP w/eGFR -Hemoglobin A1C

## 2024-05-03 NOTE — Assessment & Plan Note (Signed)
 Patient is seen by cardiology, who manage this condition.  He is well controlled with current therapy.   Will defer to them for further changes to plan of care.

## 2024-05-03 NOTE — Assessment & Plan Note (Signed)
 Setting patient up for referral to pain management.  Will defer to them for further treatment changes.  Reassess at follow up.

## 2024-05-03 NOTE — Assessment & Plan Note (Signed)
 Checking labs today.  Will continue supplements as needed.   - Vitamin D  - Vitamin B12 - TSH

## 2024-05-03 NOTE — Assessment & Plan Note (Signed)
 Patient stable.  Well controlled with current therapy.   Continue current meds.

## 2024-05-03 NOTE — Assessment & Plan Note (Signed)
 Patient is seen by pulmonary, who manage this condition.  He is well controlled with current therapy.   Will defer to them for further changes to plan of care.

## 2024-05-03 NOTE — Assessment & Plan Note (Signed)
 Checking labs today.  Continue current therapy for lipid control. Will modify as needed based on labwork results.   -CMP w/eGFR -Lipid Panel

## 2024-05-05 LAB — TOXASSURE SELECT 13 (MW), URINE

## 2024-05-08 ENCOUNTER — Other Ambulatory Visit: Payer: Self-pay | Admitting: Family

## 2024-05-08 MED ORDER — OXYCODONE HCL 5 MG PO TABS: 5.0000 mg | ORAL_TABLET | Freq: Three times a day (TID) | ORAL | 0 refills | Status: DC | PRN

## 2024-05-18 ENCOUNTER — Ambulatory Visit: Admitting: Podiatry

## 2024-05-18 ENCOUNTER — Encounter: Payer: Self-pay | Admitting: Podiatry

## 2024-05-18 ENCOUNTER — Ambulatory Visit (INDEPENDENT_AMBULATORY_CARE_PROVIDER_SITE_OTHER)

## 2024-05-18 DIAGNOSIS — L98492 Non-pressure chronic ulcer of skin of other sites with fat layer exposed: Secondary | ICD-10-CM | POA: Diagnosis not present

## 2024-05-18 DIAGNOSIS — I739 Peripheral vascular disease, unspecified: Secondary | ICD-10-CM

## 2024-05-18 MED ORDER — GENTAMICIN SULFATE 0.1 % EX OINT: 1.0000 | TOPICAL_OINTMENT | Freq: Every day | CUTANEOUS | 0 refills | Status: DC

## 2024-05-18 NOTE — Progress Notes (Signed)
  Subjective:  Patient ID: Lance House, male    DOB: [birth year],  MRN: [id number]  Chief Complaint  Patient presents with   Toe Pain    NP. Orlean is his PCP.  Was seen in Oct.  A1c is 95    73 y.o. male presents with the above complaint. History confirmed with patient.  He developed a sore spot on the left second toe with a dark spot felt like it was a scab  Objective:  Physical Exam: warm, good capillary refill, no trophic changes or ulcerative lesions, weakly palpable DP and PT pulses, and reduced peripheral sensation he has a full-thickness ulcer on the left second toe with a granular wound bed, no purulence after debridement             Radiographs: Multiple views x-ray of the left foot: no soft tissue emphysema, no signs of osteomyelitis Assessment:   1. Neurogenic ulcer with fat layer exposed (HCC)      Plan:  Patient was evaluated and treated and all questions answered.   Left second toe was debrided sharply of nonviable tissue to expose the ulceration there was no deep penetration or evidence of osteonecrosis or severe infection.  He has a prescription for Augmentin  at home that he will take and I prescribed him gentamicin ointment to use daily.  Recommended noninvasive Vasser testing and referral was placed for this as well.  Follow-up in 3 weeks to reevaluate.  Remaining nails were also debrided in thickness today with a sharp nail nipper to a tolerable level.  Return in about 3 weeks (around 06/08/2024) for wound care.

## 2024-05-25 ENCOUNTER — Telehealth: Payer: Self-pay | Admitting: Family

## 2024-05-25 NOTE — Telephone Encounter (Signed)
 Patient left VM inquiring about his lab results and said he hasn't heard from [facility] about his pain referral and wants to know if Alan will refill his pain meds until he gets in with [facility]. Please advise.

## 2024-05-26 ENCOUNTER — Other Ambulatory Visit: Payer: Self-pay | Admitting: Family

## 2024-05-26 DIAGNOSIS — F419 Anxiety disorder, unspecified: Secondary | ICD-10-CM

## 2024-06-02 ENCOUNTER — Telehealth: Payer: Self-pay | Admitting: Family

## 2024-06-02 NOTE — Telephone Encounter (Signed)
 Patient left VM stating the doctor he went to see at [facility] said she will not be able to prescribe him pain meds despite everything he has already been through/had done. Please advise on what to do moving forward.

## 2024-06-04 ENCOUNTER — Telehealth: Payer: Self-pay | Admitting: Family

## 2024-06-04 NOTE — Telephone Encounter (Signed)
 Patient left VM requesting a call back from me. Did not state what he needs.

## 2024-06-05 ENCOUNTER — Other Ambulatory Visit: Payer: Self-pay | Admitting: Family

## 2024-06-08 MED ORDER — OXYCODONE HCL 5 MG PO TABS: 5.0000 mg | ORAL_TABLET | Freq: Three times a day (TID) | ORAL | 0 refills | Status: DC | PRN

## 2024-06-10 ENCOUNTER — Ambulatory Visit (INDEPENDENT_AMBULATORY_CARE_PROVIDER_SITE_OTHER): Admitting: Podiatry

## 2024-06-10 DIAGNOSIS — L98492 Non-pressure chronic ulcer of skin of other sites with fat layer exposed: Secondary | ICD-10-CM

## 2024-06-11 ENCOUNTER — Telehealth: Payer: Self-pay | Admitting: Podiatry

## 2024-06-11 MED ORDER — GENTAMICIN SULFATE 0.1 % EX OINT: 1.0000 | TOPICAL_OINTMENT | Freq: Every day | CUTANEOUS | 0 refills | Status: AC

## 2024-06-11 MED ORDER — CEPHALEXIN 500 MG PO CAPS: 500.0000 mg | ORAL_CAPSULE | Freq: Three times a day (TID) | ORAL | 0 refills | Status: DC

## 2024-06-11 NOTE — Progress Notes (Signed)
  Subjective:  Patient ID: Lance House, male    DOB: 1950-12-28,  MRN: [id number]  Chief Complaint  Patient presents with   Wound Check    He states his toe is still numb but he thinks its getting better. He feels that the antibiotic did a great job    73 y.o. male presents with the above complaint. History confirmed with patient.  He notes lots of improvement  Objective:  Physical Exam: warm, good capillary refill, no trophic changes or ulcerative lesions, weakly palpable DP and PT pulses, and reduced peripheral sensation he has a full-thickness ulcer on the left second toe with a granular wound bed, no purulence noted no malodor            Radiographs: Multiple views x-ray of the left foot: no soft tissue emphysema, no signs of osteomyelitis Assessment:   1. Neurogenic ulcer with fat layer exposed (HCC)      Plan:  Patient was evaluated and treated and all questions answered.   Doing much better.  I recommended a second course of antibiotics to continue to treat the ulceration for any residual infection which is improved greatly.  Rx Keflex  sent to pharmacy for 1 week.  Continue local wound care with gentamicin  ointment.  Follow-up in 3 weeks to reevaluate.  Return in about 3 weeks (around 07/01/2024) for wound care.

## 2024-06-11 NOTE — Telephone Encounter (Signed)
 Patient is calling stating he was suppose to get a RX sent over to [place] at [facility]. Patient states their is nothing sent to there.

## 2024-06-15 ENCOUNTER — Telehealth: Payer: Self-pay | Admitting: Podiatry

## 2024-06-15 MED ORDER — AMOXICILLIN-POT CLAVULANATE 875-125 MG PO TABS: 1.0000 | ORAL_TABLET | Freq: Two times a day (BID) | ORAL | 0 refills | Status: AC

## 2024-06-15 NOTE — Telephone Encounter (Signed)
 Patient called in stating that the antibiotic he was prescribed is making him sick, and he would like to be prescribed another medication. He mentioned that he would prefer amoxicillin .

## 2024-06-22 ENCOUNTER — Other Ambulatory Visit: Payer: Self-pay | Admitting: Family

## 2024-06-22 DIAGNOSIS — F419 Anxiety disorder, unspecified: Secondary | ICD-10-CM

## 2024-06-23 ENCOUNTER — Ambulatory Visit: Payer: Self-pay

## 2024-06-25 NOTE — Telephone Encounter (Signed)
 See other 2 messages

## 2024-07-01 ENCOUNTER — Ambulatory Visit: Admitting: Podiatry

## 2024-07-06 ENCOUNTER — Other Ambulatory Visit: Payer: Self-pay

## 2024-07-06 DIAGNOSIS — M48061 Spinal stenosis, lumbar region without neurogenic claudication: Secondary | ICD-10-CM

## 2024-07-06 DIAGNOSIS — G8929 Other chronic pain: Secondary | ICD-10-CM

## 2024-07-06 MED ORDER — OXYCODONE HCL 5 MG PO TABS: 5.0000 mg | ORAL_TABLET | Freq: Three times a day (TID) | ORAL | 0 refills | Status: DC | PRN

## 2024-07-08 ENCOUNTER — Ambulatory Visit: Admitting: Podiatry

## 2024-07-08 VITALS — Ht 73.0 in | Wt 219.8 lb

## 2024-07-08 DIAGNOSIS — L97522 Non-pressure chronic ulcer of other part of left foot with fat layer exposed: Secondary | ICD-10-CM

## 2024-07-11 NOTE — Progress Notes (Signed)
"  °  Subjective:  Patient ID: Lance House, male    DOB: [birth year],  MRN: [id number]  Chief Complaint  Patient presents with   Wound Check    RM 2 Patient is here  for wound care of a neurogenic ulcer of the left foot. Ulcer of left 2nd toe is open with moderate drainage, toe is red and swollen in appearance.    73 y.o. male presents with the above complaint. History confirmed with patient.  He notes lots of improvement  Objective:  Physical Exam: warm, good capillary refill, no trophic changes or ulcerative lesions, weakly palpable DP and PT pulses, and reduced peripheral sensation he has a full-thickness ulcer on the left second toe with a granular wound bed, no purulence noted no malodor         Radiographs: Multiple views x-ray of the left foot: no soft tissue emphysema, no signs of osteomyelitis Assessment:   1. Ulcer of toe, left, with fat layer exposed (HCC)       Plan:  Patient was evaluated and treated and all questions answered.   Still healing well but not fully healed. We discussed flexor tenotomy to reduce the toe contracture and offload the ulceration further, discussed doing this today but he would like to wait until next visit. Debrided with tissue nipper, cont gent ointment return in 3 weeks to re evaluate.   Return in about 3 weeks (around 07/29/2024) for wound care, left 2nd toe tendon release.   "

## 2024-07-20 ENCOUNTER — Other Ambulatory Visit: Payer: Self-pay | Admitting: Family

## 2024-07-20 DIAGNOSIS — F419 Anxiety disorder, unspecified: Secondary | ICD-10-CM

## 2024-07-22 ENCOUNTER — Other Ambulatory Visit: Payer: Self-pay

## 2024-07-27 MED ORDER — HYDROXYZINE HCL 25 MG PO TABS: 25.0000 mg | ORAL_TABLET | Freq: Three times a day (TID) | ORAL | 0 refills | Status: AC | PRN

## 2024-07-29 ENCOUNTER — Telehealth: Payer: Self-pay | Admitting: Podiatry

## 2024-07-29 ENCOUNTER — Ambulatory Visit: Admitting: Podiatry

## 2024-07-29 VITALS — Ht 73.0 in | Wt 219.8 lb

## 2024-07-29 DIAGNOSIS — B351 Tinea unguium: Secondary | ICD-10-CM

## 2024-07-29 DIAGNOSIS — M79674 Pain in right toe(s): Secondary | ICD-10-CM | POA: Diagnosis not present

## 2024-07-29 DIAGNOSIS — M79675 Pain in left toe(s): Secondary | ICD-10-CM | POA: Diagnosis not present

## 2024-07-29 DIAGNOSIS — M2042 Other hammer toe(s) (acquired), left foot: Secondary | ICD-10-CM | POA: Diagnosis not present

## 2024-07-29 DIAGNOSIS — I739 Peripheral vascular disease, unspecified: Secondary | ICD-10-CM

## 2024-07-29 NOTE — Telephone Encounter (Signed)
 Patient wants to know if he still needs to do the blood flow test?

## 2024-07-29 NOTE — Progress Notes (Signed)
"  °  Subjective:  Patient ID: Tex Conroy, male    DOB: 09/21/1950,  MRN: [id number]  Chief Complaint  Patient presents with   Foot Ulcer    RM 1 Patient is here for ulcer of the left foot and tendon release of the 2nd left toe. Wound has some scabbing with visible drainage.    74 y.o. male presents with the above complaint. History confirmed with patient.  He notes continued improvement but still presence of the wound.  Nail is damaged as well.  His other nails are thickened elongated causing pain and issues  Objective:  Physical Exam: warm, good capillary refill, no trophic changes or ulcerative lesions, weakly palpable DP and PT pulses, and reduced peripheral sensation he has a full-thickness ulcer on the left second toe with a granular wound bed, smaller in dimensions no active drainage or signs of local or systemic infection.  Hammertoe contracture of the lesser digits of the left foot.  He has thickened elongated mycotic nails x 10         Radiographs: Multiple views x-ray of the left foot: no soft tissue emphysema, no signs of osteomyelitis Assessment:   1. Pain due to onychomycosis of toenails of both feet   2. PAD (peripheral artery disease)   3. Hammertoe of left foot       Plan:  Patient was evaluated and treated and all questions answered.   Discussed the etiology and treatment options for the condition in detail with the patient. Recommended debridement of the nails today. Sharp and mechanical debridement performed of all painful and mycotic nails today. Nails debrided in length and thickness using a nail nipper to level of comfort. Follow up as needed for painful nails.   Wound continues to heal, I discussed with him for prevention of recurrence and facilitate pressure unloading and contracture of bony deformity contribute to the development of the ulceration.  Following consent the left second toe was anesthetized with  1.5 cc each of lidocaine  and Marcaine .  The  toe was then prepped with iodine, exsanguinated and a tourniquet secured around the base of the toe.  A #11 blade was used to create a small incision on the base of the plantar toe and created percutaneous flexor tenotomy.  Dorsiflexion was applied to the toe in order to reduce the contracture and good release was felt.  A sterile postoperative bandage with Silvadene, nonstick gauze, dry sterile gauze and Coban compression wrap was applied.  Post care instructions given.  Return in 4 weeks for postoperative visit with new x-rays "

## 2024-08-03 ENCOUNTER — Encounter: Payer: Self-pay | Admitting: Family

## 2024-08-03 ENCOUNTER — Ambulatory Visit: Admitting: Family

## 2024-08-03 VITALS — BP 116/82 | HR 78 | Ht 73.0 in | Wt 215.0 lb

## 2024-08-03 DIAGNOSIS — L97522 Non-pressure chronic ulcer of other part of left foot with fat layer exposed: Secondary | ICD-10-CM | POA: Insufficient documentation

## 2024-08-03 DIAGNOSIS — F419 Anxiety disorder, unspecified: Secondary | ICD-10-CM | POA: Diagnosis not present

## 2024-08-03 DIAGNOSIS — E782 Mixed hyperlipidemia: Secondary | ICD-10-CM

## 2024-08-03 DIAGNOSIS — G8929 Other chronic pain: Secondary | ICD-10-CM | POA: Diagnosis not present

## 2024-08-03 DIAGNOSIS — E1165 Type 2 diabetes mellitus with hyperglycemia: Secondary | ICD-10-CM | POA: Diagnosis not present

## 2024-08-03 DIAGNOSIS — I5032 Chronic diastolic (congestive) heart failure: Secondary | ICD-10-CM | POA: Diagnosis not present

## 2024-08-03 DIAGNOSIS — M5442 Lumbago with sciatica, left side: Secondary | ICD-10-CM | POA: Diagnosis not present

## 2024-08-03 DIAGNOSIS — F331 Major depressive disorder, recurrent, moderate: Secondary | ICD-10-CM | POA: Diagnosis not present

## 2024-08-03 DIAGNOSIS — Z79899 Other long term (current) drug therapy: Secondary | ICD-10-CM

## 2024-08-03 DIAGNOSIS — E559 Vitamin D deficiency, unspecified: Secondary | ICD-10-CM

## 2024-08-03 DIAGNOSIS — R5383 Other fatigue: Secondary | ICD-10-CM

## 2024-08-03 DIAGNOSIS — K7010 Alcoholic hepatitis without ascites: Secondary | ICD-10-CM | POA: Diagnosis not present

## 2024-08-03 DIAGNOSIS — J449 Chronic obstructive pulmonary disease, unspecified: Secondary | ICD-10-CM

## 2024-08-03 DIAGNOSIS — F1021 Alcohol dependence, in remission: Secondary | ICD-10-CM | POA: Diagnosis not present

## 2024-08-03 DIAGNOSIS — E538 Deficiency of other specified B group vitamins: Secondary | ICD-10-CM

## 2024-08-03 DIAGNOSIS — M5441 Lumbago with sciatica, right side: Secondary | ICD-10-CM | POA: Diagnosis not present

## 2024-08-03 DIAGNOSIS — Z0130 Encounter for examination of blood pressure without abnormal findings: Secondary | ICD-10-CM

## 2024-08-03 DIAGNOSIS — E0849 Diabetes mellitus due to underlying condition with other diabetic neurological complication: Secondary | ICD-10-CM | POA: Insufficient documentation

## 2024-08-03 LAB — POC CREATINE & ALBUMIN,URINE
Albumin/Creatinine Ratio, Urine, POC: <30
Creatinine, POC: 100 mg/dL
Microalbumin Ur, POC: 10 mg/L

## 2024-08-03 MED ORDER — OXYCODONE HCL 5 MG PO TABS: 5.0000 mg | ORAL_TABLET | Freq: Three times a day (TID) | ORAL | 0 refills | Status: AC | PRN

## 2024-08-03 MED ORDER — CLONAZEPAM 1 MG PO TABS: 1.0000 mg | ORAL_TABLET | Freq: Two times a day (BID) | ORAL | 3 refills | Status: AC

## 2024-08-03 NOTE — Progress Notes (Unsigned)
 "   Established Patient Office Visit  Subjective:  Patient ID: Lance House, male    DOB: 1950/11/12  Age: 74 y.o. MRN: [id number]  Chief Complaint  Patient presents with   Follow-up    3 month follow up    Patient is here today for his 3 months follow up.  He has been feeling well since last appointment.   He does have additional concerns to discuss today.  He has been seeing podiatry regularly since his last appointment with me, they have been managing the issues with his toe (see notes/pictures).  He also has an appointment set up with pain management on Jan 31st, so they will be taking over prescribing the medications for his pain after that point.   I am bridging him until he has this appointment with 1 more month of medication.   Labs are due today.  He needs refills.   I have reviewed his active problem list, medication list, allergies, notes from last encounter, lab results for his appointment today.      No other concerns at this time.   Past Medical History:  Diagnosis Date   Abdominal pain 04/08/2019   Anxiety    Anxiety hyperventilation 05/08/2019   Closed compression fracture of L4 vertebra (HCC) 08/18/2021   Closed fracture of distal fibula 02/13/2021   Compression fracture of thoracic vertebra (HCC) 08/18/2021   Depression    Elevated troponin    Gout    Hypokalemia 08/16/2021   Hyponatremia 08/16/2021   ICH (intracerebral hemorrhage) (HCC) 08/15/2021   Incarcerated left inguinal hernia    Injury of right ankle 02/13/2021   Left flank pain 02/03/2013   Other fatigue 10/25/2022   Pain    Pressure injury of skin 09/11/2021   Rhabdomyolysis 08/16/2021    Past Surgical History:  Procedure Laterality Date   APPENDECTOMY  1994   INGUINAL HERNIA REPAIR Left 04/13/2019   Procedure: HERNIA REPAIR INGUINAL ADULT OPEN, LEFT;  Surgeon: Marolyn Nest, MD;  Location: [facility] ORS;  Service: General;  Laterality: Left;   NASAL SINUS SURGERY  2015   [facility]    RIGHT HEART CATH Right 04/26/2023   Procedure: RIGHT HEART CATH;  Surgeon: Rolan Ezra RAMAN, MD;  Location[facility] INVASIVE CV LAB;  Service: Cardiovascular;  Laterality: Right;   VIDEO ASSISTED THORACOSCOPY (VATS)/DECORTICATION Left 09/11/2021   Procedure: VIDEO ASSISTED THORACOSCOPY (VATS)/DECORTICATION;  Surgeon: Kerrin Elspeth BROCKS, MD;  Location: [facility] OR;  Service: Thoracic;  Laterality: Left;    Social History   Socioeconomic History   Marital status: Married    Spouse name: Not on file   Number of children: Not on file   Years of education: Not on file   Highest education level: Not on file  Occupational History   Not on file  Tobacco Use   Smoking status: Every Day    Current packs/day: 1.00    Types: Cigarettes   Smokeless tobacco: Never  Vaping Use   Vaping status: Never Used  Substance and Sexual Activity   Alcohol use: Not Currently    Alcohol/week: 42.0 standard drinks of alcohol    Types: 42 Cans of beer per week    Comment: quit around 05/2021   Drug use: Not Currently   Sexual activity: Not on file  Other Topics Concern   Not on file  Social History Narrative   Lives with wife, Dorthea. No indoor pets.   Social Drivers of Health   Tobacco Use: High Risk (08/03/2024)   Patient  History    Smoking Tobacco Use: Every Day    Smokeless Tobacco Use: Never    Passive Exposure: Not on file  Financial Resource Strain: Low Risk (07/10/2023)   Overall Financial Resource Strain (CARDIA)    Difficulty of Paying Living Expenses: Not very hard  Food Insecurity: No Food Insecurity (07/10/2023)   Hunger Vital Sign    Worried About Running Out of Food in the Last Year: Never true    Ran Out of Food in the Last Year: Never true  Transportation Needs: No Transportation Needs (07/10/2023)   PRAPARE - Administrator, Civil Service (Medical): No    Lack of Transportation (Non-Medical): No  Physical Activity: Sufficiently Active (07/10/2023)   Exercise Vital Sign     Days of Exercise per Week: 3 days    Minutes of Exercise per Session: 80 min  Stress: No Stress Concern Present (07/10/2023)   [employer] of Occupational Health - Occupational Stress Questionnaire    Feeling of Stress : Not at all  Social Connections: Socially Isolated (07/10/2023)   Social Connection and Isolation Panel    Frequency of Communication with Friends and Family: More than three times a week    Frequency of Social Gatherings with Friends and Family: Once a week    Attends Religious Services: Never    Database Administrator or Organizations: No    Attends Banker Meetings: Never    Marital Status: Separated  Intimate Partner Violence: Not At Risk (07/10/2023)   Humiliation, Afraid, Rape, and Kick questionnaire    Fear of Current or Ex-Partner: No    Emotionally Abused: No    Physically Abused: No    Sexually Abused: No  Depression (PHQ2-9): Low Risk (07/10/2023)   Depression (PHQ2-9)    PHQ-2 Score: 4  Alcohol Screen: Low Risk (07/10/2023)   Alcohol Screen    Last Alcohol Screening Score (AUDIT): 0  Housing: Low Risk (07/10/2023)   Housing Stability Vital Sign    Unable to Pay for Housing in the Last Year: No    Number of Times Moved in the Last Year: 0    Homeless in the Last Year: No  Utilities: Not At Risk (07/10/2023)   [employer] Utilities    Threatened with loss of utilities: No  Health Literacy: Adequate Health Literacy (07/10/2023)   B1300 Health Literacy    Frequency of need for help with medical instructions: Never    Family History  Problem Relation Age of Onset   Skin cancer Father    Multiple sclerosis Sister    Non-Hodgkin's lymphoma Brother     Allergies[1]  Review of Systems  All other systems reviewed and are negative.      Objective:   BP 116/82   Pulse 78   Ht 6' 1 (1.854 m)   Wt 215 lb (97.5 kg)   SpO2 95%   BMI 28.37 kg/m   Vitals:   08/03/24 1113  BP: 116/82  Pulse: 78  Height: 6' 1 (1.854 m)  Weight:  215 lb (97.5 kg)  SpO2: 95%  BMI (Calculated): 28.37    Physical Exam Vitals and nursing note reviewed.  Constitutional:      Appearance: Normal appearance. He is normal weight.  Eyes:     Pupils: Pupils are equal, round, and reactive to light.  Cardiovascular:     Rate and Rhythm: Normal rate and regular rhythm.     Pulses: Normal pulses.     Heart sounds: Normal  heart sounds.  Pulmonary:     Effort: Pulmonary effort is normal.     Breath sounds: Normal breath sounds.  Neurological:     Mental Status: He is alert.  Psychiatric:        Mood and Affect: Mood normal.        Behavior: Behavior normal.      No results found for any visits on 08/03/24.  No results found for this or any previous visit (from the past 2160 hours).     Assessment & Plan Ulcer of toe, left, with fat layer exposed (HCC) Patient is seen by Podiatry, who manage this condition.  He is well controlled with current therapy.   Will defer to them for further changes to plan of care.  Alcohol dependence in remission (HCC) Alcoholic hepatitis without ascites (HCC) Patient stable.  Well controlled with current therapy.   Continue current meds.   Chronic heart failure with preserved ejection fraction [facility]) Patient is seen by cardiology, who manage this condition.  He is well controlled with current therapy.   Will defer to them for further changes to plan of care.  Chronic obstructive pulmonary disease, unspecified COPD type (HCC) Patient stable.  Well controlled with current therapy.   Continue current meds.   Major depressive disorder, recurrent, moderate (HCC) Anxiety Patient stable.  Well controlled with current therapy.   Continue current meds.   Type 2 diabetes mellitus with hyperglycemia, without long-term current use of insulin (HCC) Checking labs today. Will call pt. With results  Continue current diabetes POC, as patient has been well controlled on current regimen.  Will  adjust meds if needed based on labs.   -CBC w/Diff -CMP w/eGFR -Hemoglobin A1C  Mixed hyperlipidemia Checking labs today.  Continue current therapy for lipid control. Will modify as needed based on labwork results.   -CMP w/eGFR -Lipid Panel  Vitamin D  deficiency, unspecified B12 deficiency due to diet Other fatigue Checking labs today.  Will continue supplements as needed.   - Vitamin D  - Vitamin B12 - TSH  Chronic bilateral low back pain with bilateral sciatica Long term current use of therapeutic drug UDS ordered today.  Will call with results when available.   Pt has appointment set with pain mgmt, who will take over the prescribing of his pain medications.   Will defer to them for further treatment decisions.     Return in about 3 months (around 11/01/2024) for AWV.   Total time spent: 30 minutes  ALAN CHRISTELLA ARRANT, FNP  08/03/2024   This document may have been prepared by [facility] Voice Recognition software and as such may include unintentional dictation errors.      [1]  Allergies Allergen Reactions   Ciprocin-Fluocin-Procin [Fluocinolone] Anaphylaxis    Hip hurt   Ciprofloxacin Other (See Comments)    Unknown reaction   Colchicine  Other (See Comments)   Ezetimibe Nausea And Vomiting   Farxiga  [Dapagliflozin ] Diarrhea    dizziness   Fentanyl      Couldn't control hisself   Meloxicam  Diarrhea and Swelling   Duloxetine Diarrhea, Nausea And Vomiting and Other (See Comments)    Altered mental status   "

## 2024-08-04 ENCOUNTER — Encounter: Payer: Self-pay | Admitting: Family

## 2024-08-04 LAB — CBC WITH DIFFERENTIAL/PLATELET
Basophils Absolute: 0.1 x10E3/uL (ref 0.0–0.2)
Basos: 1 %
EOS (ABSOLUTE): 0.5 x10E3/uL — ABNORMAL HIGH (ref 0.0–0.4)
Eos: 6 %
Hematocrit: 49.5 % (ref 37.5–51.0)
Hemoglobin: 16.7 g/dL (ref 13.0–17.7)
Immature Grans (Abs): 0 x10E3/uL (ref 0.0–0.1)
Immature Granulocytes: 0 %
Lymphocytes Absolute: 2 x10E3/uL (ref 0.7–3.1)
Lymphs: 21 %
MCH: 30.8 pg (ref 26.6–33.0)
MCHC: 33.7 g/dL (ref 31.5–35.7)
MCV: 91 fL (ref 79–97)
Monocytes Absolute: 0.7 x10E3/uL (ref 0.1–0.9)
Monocytes: 8 %
Neutrophils Absolute: 6.1 x10E3/uL (ref 1.4–7.0)
Neutrophils: 64 %
Platelets: 258 x10E3/uL (ref 150–450)
RBC: 5.43 x10E6/uL (ref 4.14–5.80)
RDW: 12.3 % (ref 11.6–15.4)
WBC: 9.4 x10E3/uL (ref 3.4–10.8)

## 2024-08-04 LAB — CMP14+EGFR
ALT: 5 IU/L (ref 0–44)
AST: 12 IU/L (ref 0–40)
Albumin: 4.3 g/dL (ref 3.8–4.8)
Alkaline Phosphatase: 63 IU/L (ref 47–123)
BUN/Creatinine Ratio: 11 (ref 10–24)
BUN: 8 mg/dL (ref 8–27)
Bilirubin Total: 0.8 mg/dL (ref 0.0–1.2)
CO2: 24 mmol/L (ref 20–29)
Calcium: 9.5 mg/dL (ref 8.6–10.2)
Chloride: 98 mmol/L (ref 96–106)
Creatinine, Ser: 0.76 mg/dL (ref 0.76–1.27)
Globulin, Total: 2.7 g/dL (ref 1.5–4.5)
Glucose: 99 mg/dL (ref 70–99)
Potassium: 4.8 mmol/L (ref 3.5–5.2)
Sodium: 138 mmol/L (ref 134–144)
Total Protein: 7 g/dL (ref 6.0–8.5)
eGFR: 96 mL/min/1.73

## 2024-08-04 LAB — VITAMIN D 25 HYDROXY (VIT D DEFICIENCY, FRACTURES): Vit D, 25-Hydroxy: 43.2 ng/mL (ref 30.0–100.0)

## 2024-08-04 LAB — LIPID PANEL
Chol/HDL Ratio: 4.7 ratio (ref 0.0–5.0)
Cholesterol, Total: 206 mg/dL — ABNORMAL HIGH (ref 100–199)
HDL: 44 mg/dL
LDL Chol Calc (NIH): 144 mg/dL — ABNORMAL HIGH (ref 0–99)
Triglycerides: 101 mg/dL (ref 0–149)
VLDL Cholesterol Cal: 18 mg/dL (ref 5–40)

## 2024-08-04 LAB — TSH: TSH: 2.72 u[IU]/mL (ref 0.450–4.500)

## 2024-08-04 LAB — HEMOGLOBIN A1C
Est. average glucose Bld gHb Est-mCnc: 123 mg/dL
Hgb A1c MFr Bld: 5.9 % — ABNORMAL HIGH (ref 4.8–5.6)

## 2024-08-04 LAB — VITAMIN B12: Vitamin B-12: 812 pg/mL (ref 232–1245)

## 2024-08-04 NOTE — Assessment & Plan Note (Signed)
 Checking labs today.  Continue current therapy for lipid control. Will modify as needed based on labwork results.   -CMP w/eGFR -Lipid Panel

## 2024-08-04 NOTE — Assessment & Plan Note (Signed)
 Checking labs today.  Will continue supplements as needed.   - Vitamin D  - Vitamin B12 - TSH

## 2024-08-04 NOTE — Assessment & Plan Note (Signed)
 Patient is seen by Podiatry, who manage this condition.  He is well controlled with current therapy.   Will defer to them for further changes to plan of care.

## 2024-08-04 NOTE — Assessment & Plan Note (Signed)
 UDS ordered today.  Will call with results when available.   Pt has appointment set with pain mgmt, who will take over the prescribing of his pain medications.   Will defer to them for further treatment decisions.

## 2024-08-04 NOTE — Assessment & Plan Note (Signed)
 Patient stable.  Well controlled with current therapy.   Continue current meds.

## 2024-08-04 NOTE — Assessment & Plan Note (Signed)
 Patient is seen by cardiology, who manage this condition.  He is well controlled with current therapy.   Will defer to them for further changes to plan of care.

## 2024-08-04 NOTE — Assessment & Plan Note (Signed)
 Checking labs today. Will call pt. With results  Continue current diabetes POC, as patient has been well controlled on current regimen.  Will adjust meds if needed based on labs.   -CBC w/Diff -CMP w/eGFR -Hemoglobin A1C

## 2024-08-06 LAB — TOXASSURE SELECT 13 (MW), URINE

## 2024-08-06 NOTE — Telephone Encounter (Signed)
 Per Alan this was discussed at the patient visit

## 2024-08-07 ENCOUNTER — Ambulatory Visit: Payer: Self-pay

## 2024-08-10 NOTE — Progress Notes (Signed)
 Izick Gasbarro                                          MRN: [id number]   08/10/2024   The [facility] Quality Team Specialist reviewed this patient medical record for the purposes of chart review for care gap closure. The following were reviewed: chart review for care gap closure-diabetic eye exam.    [facility] Quality Team

## 2024-08-13 ENCOUNTER — Other Ambulatory Visit: Payer: Self-pay | Admitting: Family

## 2024-08-18 ENCOUNTER — Telehealth: Payer: Self-pay | Admitting: Family

## 2024-08-18 NOTE — Telephone Encounter (Signed)
 Patient left VM wanting something for his nerves. Said someone in his family passed away and he feels like he is shaking inside. Mentioned if you can send him some Valium  or something similar for a week supply until he gets through this.

## 2024-08-19 NOTE — Telephone Encounter (Signed)
 Patient called in again and said his clonazepam  is not helping at this time. I told him that his clonazepam  can be filled in 2 days but he says that it isn't doing anything to help with his anxiety at this time. He states he is shaking inside and has a lot going on with the weather and the death of his step-mother. He wants to know if we can send him some Valium  or something to help him get through this hard time.

## 2024-08-31 ENCOUNTER — Ambulatory Visit

## 2024-09-03 ENCOUNTER — Ambulatory Visit: Admitting: Podiatry

## 2024-09-07 ENCOUNTER — Ambulatory Visit

## 2024-09-09 ENCOUNTER — Encounter: Admitting: Podiatry

## 2024-11-02 ENCOUNTER — Ambulatory Visit: Admitting: Family
